# Patient Record
Sex: Female | Born: 1950 | Race: White | Hispanic: No | Smoking: Never smoker
Health system: Southern US, Community
[De-identification: ages and names within clinical notes are randomized; demographics above are authoritative.]

## PROBLEM LIST (undated history)

## (undated) DIAGNOSIS — C679 Malignant neoplasm of bladder, unspecified: Secondary | ICD-10-CM

## (undated) DIAGNOSIS — G8929 Other chronic pain: Secondary | ICD-10-CM

## (undated) DIAGNOSIS — I10 Essential (primary) hypertension: Secondary | ICD-10-CM

## (undated) DIAGNOSIS — C787 Secondary malignant neoplasm of liver and intrahepatic bile duct: Secondary | ICD-10-CM

## (undated) DIAGNOSIS — M6281 Muscle weakness (generalized): Secondary | ICD-10-CM

## (undated) DIAGNOSIS — E876 Hypokalemia: Secondary | ICD-10-CM

## (undated) DIAGNOSIS — N390 Urinary tract infection, site not specified: Secondary | ICD-10-CM

## (undated) DIAGNOSIS — E119 Type 2 diabetes mellitus without complications: Secondary | ICD-10-CM

## (undated) DIAGNOSIS — I4891 Unspecified atrial fibrillation: Secondary | ICD-10-CM

## (undated) DIAGNOSIS — I509 Heart failure, unspecified: Secondary | ICD-10-CM

## (undated) DIAGNOSIS — B850 Pediculosis due to Pediculus humanus capitis: Secondary | ICD-10-CM

## (undated) DIAGNOSIS — R41841 Cognitive communication deficit: Secondary | ICD-10-CM

## (undated) DIAGNOSIS — I639 Cerebral infarction, unspecified: Secondary | ICD-10-CM

## (undated) HISTORY — DX: Unspecified atrial fibrillation: I48.91

## (undated) HISTORY — DX: Malignant neoplasm of bladder, unspecified: C67.9

## (undated) HISTORY — DX: Secondary malignant neoplasm of liver and intrahepatic bile duct: C78.7

## (undated) HISTORY — DX: Muscle weakness (generalized): M62.81

## (undated) HISTORY — DX: Hypokalemia: E87.6

## (undated) HISTORY — DX: Cognitive communication deficit: R41.841

## (undated) HISTORY — DX: Pediculosis due to pediculus humanus capitis: B85.0

## (undated) HISTORY — DX: Heart failure, unspecified: I50.9

## (undated) HISTORY — DX: Other chronic pain: G89.29

## (undated) HISTORY — DX: Urinary tract infection, site not specified: N39.0

---

## 2008-04-11 ENCOUNTER — Ambulatory Visit: Payer: Self-pay

## 2008-05-02 ENCOUNTER — Ambulatory Visit: Payer: Self-pay

## 2008-05-22 ENCOUNTER — Ambulatory Visit: Payer: Self-pay | Admitting: Surgery

## 2008-06-07 ENCOUNTER — Ambulatory Visit: Payer: Self-pay | Admitting: Surgery

## 2009-03-31 ENCOUNTER — Emergency Department: Payer: Self-pay | Admitting: Emergency Medicine

## 2009-04-08 ENCOUNTER — Encounter: Payer: Self-pay | Admitting: Neurology

## 2009-05-04 ENCOUNTER — Encounter: Payer: Self-pay | Admitting: Neurology

## 2009-06-04 ENCOUNTER — Encounter: Payer: Self-pay | Admitting: Neurology

## 2009-07-02 ENCOUNTER — Encounter: Payer: Self-pay | Admitting: Neurology

## 2009-08-02 ENCOUNTER — Encounter: Payer: Self-pay | Admitting: Neurology

## 2009-09-01 ENCOUNTER — Encounter: Payer: Self-pay | Admitting: Neurology

## 2010-02-24 ENCOUNTER — Ambulatory Visit: Payer: Self-pay | Admitting: Family Medicine

## 2011-03-11 ENCOUNTER — Ambulatory Visit: Payer: Self-pay

## 2011-05-05 DIAGNOSIS — I639 Cerebral infarction, unspecified: Secondary | ICD-10-CM

## 2011-05-05 HISTORY — DX: Cerebral infarction, unspecified: I63.9

## 2012-05-18 ENCOUNTER — Ambulatory Visit: Payer: Self-pay | Admitting: Family Medicine

## 2013-07-26 ENCOUNTER — Ambulatory Visit: Payer: Self-pay | Admitting: Family Medicine

## 2014-08-09 ENCOUNTER — Ambulatory Visit
Admit: 2014-08-09 | Disposition: A | Discharge status: Home / Self Care | Payer: Self-pay | Attending: Family Medicine | Admitting: Family Medicine

## 2016-03-20 ENCOUNTER — Emergency Department: Payer: Medicare HMO

## 2016-03-20 ENCOUNTER — Inpatient Hospital Stay
Admission: EM | Admit: 2016-03-20 | Discharge: 2016-03-27 | DRG: 291 | Disposition: A | Discharge status: Skilled Nursing Facility | Payer: Medicare HMO | Attending: Internal Medicine | Admitting: Internal Medicine

## 2016-03-20 ENCOUNTER — Encounter: Payer: Self-pay | Admitting: Emergency Medicine

## 2016-03-20 ENCOUNTER — Inpatient Hospital Stay: Payer: Medicare HMO

## 2016-03-20 DIAGNOSIS — N179 Acute kidney failure, unspecified: Secondary | ICD-10-CM | POA: Diagnosis present

## 2016-03-20 DIAGNOSIS — E662 Morbid (severe) obesity with alveolar hypoventilation: Secondary | ICD-10-CM | POA: Diagnosis present

## 2016-03-20 DIAGNOSIS — Z515 Encounter for palliative care: Secondary | ICD-10-CM | POA: Diagnosis not present

## 2016-03-20 DIAGNOSIS — E782 Mixed hyperlipidemia: Secondary | ICD-10-CM | POA: Diagnosis present

## 2016-03-20 DIAGNOSIS — R0602 Shortness of breath: Secondary | ICD-10-CM

## 2016-03-20 DIAGNOSIS — D509 Iron deficiency anemia, unspecified: Secondary | ICD-10-CM | POA: Diagnosis present

## 2016-03-20 DIAGNOSIS — J9621 Acute and chronic respiratory failure with hypoxia: Secondary | ICD-10-CM | POA: Diagnosis present

## 2016-03-20 DIAGNOSIS — I480 Paroxysmal atrial fibrillation: Secondary | ICD-10-CM | POA: Diagnosis present

## 2016-03-20 DIAGNOSIS — E1122 Type 2 diabetes mellitus with diabetic chronic kidney disease: Secondary | ICD-10-CM | POA: Diagnosis present

## 2016-03-20 DIAGNOSIS — G4733 Obstructive sleep apnea (adult) (pediatric): Secondary | ICD-10-CM | POA: Diagnosis present

## 2016-03-20 DIAGNOSIS — J9601 Acute respiratory failure with hypoxia: Secondary | ICD-10-CM | POA: Diagnosis not present

## 2016-03-20 DIAGNOSIS — R0600 Dyspnea, unspecified: Secondary | ICD-10-CM

## 2016-03-20 DIAGNOSIS — J810 Acute pulmonary edema: Secondary | ICD-10-CM | POA: Diagnosis not present

## 2016-03-20 DIAGNOSIS — Z7984 Long term (current) use of oral hypoglycemic drugs: Secondary | ICD-10-CM | POA: Diagnosis not present

## 2016-03-20 DIAGNOSIS — E876 Hypokalemia: Secondary | ICD-10-CM | POA: Diagnosis present

## 2016-03-20 DIAGNOSIS — J441 Chronic obstructive pulmonary disease with (acute) exacerbation: Secondary | ICD-10-CM | POA: Insufficient documentation

## 2016-03-20 DIAGNOSIS — R609 Edema, unspecified: Secondary | ICD-10-CM

## 2016-03-20 DIAGNOSIS — R0682 Tachypnea, not elsewhere classified: Secondary | ICD-10-CM

## 2016-03-20 DIAGNOSIS — J9602 Acute respiratory failure with hypercapnia: Secondary | ICD-10-CM

## 2016-03-20 DIAGNOSIS — I509 Heart failure, unspecified: Secondary | ICD-10-CM

## 2016-03-20 DIAGNOSIS — I130 Hypertensive heart and chronic kidney disease with heart failure and stage 1 through stage 4 chronic kidney disease, or unspecified chronic kidney disease: Principal | ICD-10-CM | POA: Diagnosis present

## 2016-03-20 DIAGNOSIS — G9341 Metabolic encephalopathy: Secondary | ICD-10-CM | POA: Diagnosis present

## 2016-03-20 DIAGNOSIS — N183 Chronic kidney disease, stage 3 (moderate): Secondary | ICD-10-CM | POA: Diagnosis present

## 2016-03-20 DIAGNOSIS — I481 Persistent atrial fibrillation: Secondary | ICD-10-CM | POA: Diagnosis not present

## 2016-03-20 DIAGNOSIS — J209 Acute bronchitis, unspecified: Secondary | ICD-10-CM | POA: Diagnosis present

## 2016-03-20 DIAGNOSIS — Z66 Do not resuscitate: Secondary | ICD-10-CM | POA: Diagnosis present

## 2016-03-20 DIAGNOSIS — I5031 Acute diastolic (congestive) heart failure: Secondary | ICD-10-CM | POA: Diagnosis present

## 2016-03-20 DIAGNOSIS — J9811 Atelectasis: Secondary | ICD-10-CM | POA: Diagnosis present

## 2016-03-20 DIAGNOSIS — M6281 Muscle weakness (generalized): Secondary | ICD-10-CM

## 2016-03-20 DIAGNOSIS — Z79899 Other long term (current) drug therapy: Secondary | ICD-10-CM

## 2016-03-20 DIAGNOSIS — R32 Unspecified urinary incontinence: Secondary | ICD-10-CM | POA: Diagnosis present

## 2016-03-20 DIAGNOSIS — Z7982 Long term (current) use of aspirin: Secondary | ICD-10-CM | POA: Diagnosis not present

## 2016-03-20 DIAGNOSIS — J9622 Acute and chronic respiratory failure with hypercapnia: Secondary | ICD-10-CM | POA: Diagnosis present

## 2016-03-20 DIAGNOSIS — Z8673 Personal history of transient ischemic attack (TIA), and cerebral infarction without residual deficits: Secondary | ICD-10-CM

## 2016-03-20 DIAGNOSIS — J440 Chronic obstructive pulmonary disease with acute lower respiratory infection: Secondary | ICD-10-CM | POA: Diagnosis present

## 2016-03-20 DIAGNOSIS — Z6841 Body Mass Index (BMI) 40.0 and over, adult: Secondary | ICD-10-CM | POA: Diagnosis not present

## 2016-03-20 DIAGNOSIS — Z452 Encounter for adjustment and management of vascular access device: Secondary | ICD-10-CM

## 2016-03-20 DIAGNOSIS — I4891 Unspecified atrial fibrillation: Secondary | ICD-10-CM | POA: Diagnosis not present

## 2016-03-20 HISTORY — DX: Type 2 diabetes mellitus without complications: E11.9

## 2016-03-20 HISTORY — DX: Essential (primary) hypertension: I10

## 2016-03-20 HISTORY — DX: Cerebral infarction, unspecified: I63.9

## 2016-03-20 LAB — URINALYSIS COMPLETE WITH MICROSCOPIC (ARMC ONLY)
Bilirubin Urine: NEGATIVE
Glucose, UA: NEGATIVE mg/dL
Ketones, ur: NEGATIVE mg/dL
Nitrite: NEGATIVE
Protein, ur: 100 mg/dL — AB
Specific Gravity, Urine: 1.013 (ref 1.005–1.030)
pH: 6 (ref 5.0–8.0)

## 2016-03-20 LAB — IRON AND TIBC
Iron: 19 ug/dL — ABNORMAL LOW (ref 28–170)
Saturation Ratios: 5 % — ABNORMAL LOW (ref 10.4–31.8)
TIBC: 400 ug/dL (ref 250–450)
UIBC: 381 ug/dL

## 2016-03-20 LAB — CBC WITH DIFFERENTIAL/PLATELET
Basophils Absolute: 0.4 10*3/uL — ABNORMAL HIGH (ref 0–0.1)
Basophils Relative: 5 %
Eosinophils Absolute: 0.1 10*3/uL (ref 0–0.7)
Eosinophils Relative: 1 %
HCT: 30 % — ABNORMAL LOW (ref 35.0–47.0)
Hemoglobin: 9.2 g/dL — ABNORMAL LOW (ref 12.0–16.0)
Lymphocytes Relative: 8 %
Lymphs Abs: 0.6 10*3/uL — ABNORMAL LOW (ref 1.0–3.6)
MCH: 22.7 pg — ABNORMAL LOW (ref 26.0–34.0)
MCHC: 30.7 g/dL — ABNORMAL LOW (ref 32.0–36.0)
MCV: 74 fL — ABNORMAL LOW (ref 80.0–100.0)
Monocytes Absolute: 0.6 10*3/uL (ref 0.2–0.9)
Monocytes Relative: 8 %
Neutro Abs: 6.4 10*3/uL (ref 1.4–6.5)
Neutrophils Relative %: 78 %
Platelets: 338 10*3/uL (ref 150–440)
RBC: 4.05 MIL/uL (ref 3.80–5.20)
RDW: 17.4 % — ABNORMAL HIGH (ref 11.5–14.5)
WBC: 8.1 10*3/uL (ref 3.6–11.0)

## 2016-03-20 LAB — GLUCOSE, CAPILLARY: Glucose-Capillary: 229 mg/dL — ABNORMAL HIGH (ref 65–99)

## 2016-03-20 LAB — VITAMIN B12: Vitamin B-12: 771 pg/mL (ref 180–914)

## 2016-03-20 LAB — RETICULOCYTES
RBC.: 3.98 MIL/uL (ref 3.80–5.20)
Retic Count, Absolute: 99.5 10*3/uL (ref 19.0–183.0)
Retic Ct Pct: 2.5 % (ref 0.4–3.1)

## 2016-03-20 LAB — BLOOD GAS, VENOUS
Acid-Base Excess: 10.6 mmol/L — ABNORMAL HIGH (ref 0.0–2.0)
Bicarbonate: 37.6 mmol/L — ABNORMAL HIGH (ref 20.0–28.0)
O2 Saturation: 59.4 %
Patient temperature: 37
pCO2, Ven: 65 mmHg — ABNORMAL HIGH (ref 44.0–60.0)
pH, Ven: 7.37 (ref 7.250–7.430)
pO2, Ven: 32 mmHg (ref 32.0–45.0)

## 2016-03-20 LAB — COMPREHENSIVE METABOLIC PANEL
ALT: 9 U/L — ABNORMAL LOW (ref 14–54)
AST: 18 U/L (ref 15–41)
Albumin: 3.1 g/dL — ABNORMAL LOW (ref 3.5–5.0)
Alkaline Phosphatase: 69 U/L (ref 38–126)
Anion gap: 7 (ref 5–15)
BUN: 24 mg/dL — ABNORMAL HIGH (ref 6–20)
CO2: 34 mmol/L — ABNORMAL HIGH (ref 22–32)
Calcium: 8.7 mg/dL — ABNORMAL LOW (ref 8.9–10.3)
Chloride: 97 mmol/L — ABNORMAL LOW (ref 101–111)
Creatinine, Ser: 1.09 mg/dL — ABNORMAL HIGH (ref 0.44–1.00)
GFR calc Af Amer: 59 mL/min — ABNORMAL LOW (ref >60)
GFR calc non Af Amer: 51 mL/min — ABNORMAL LOW (ref >60)
Glucose, Bld: 164 mg/dL — ABNORMAL HIGH (ref 65–99)
Potassium: 3.4 mmol/L — ABNORMAL LOW (ref 3.5–5.1)
Sodium: 138 mmol/L (ref 135–145)
Total Bilirubin: 1.2 mg/dL (ref 0.3–1.2)
Total Protein: 7.3 g/dL (ref 6.5–8.1)

## 2016-03-20 LAB — FERRITIN: Ferritin: 8 ng/mL — ABNORMAL LOW (ref 11–307)

## 2016-03-20 LAB — PROTIME-INR
INR: 1.26
Prothrombin Time: 15.9 seconds — ABNORMAL HIGH (ref 11.4–15.2)

## 2016-03-20 LAB — BRAIN NATRIURETIC PEPTIDE: B Natriuretic Peptide: 160 pg/mL — ABNORMAL HIGH (ref 0.0–100.0)

## 2016-03-20 LAB — TROPONIN I: Troponin I: <0.03 ng/mL (ref <0.03)

## 2016-03-20 LAB — FOLATE: Folate: 8.8 ng/mL (ref >5.9)

## 2016-03-20 LAB — TSH: TSH: 2.156 u[IU]/mL (ref 0.350–4.500)

## 2016-03-20 LAB — APTT: aPTT: 29 seconds (ref 24–36)

## 2016-03-20 MED ORDER — LINDANE 1 % EX SHAM
MEDICATED_SHAMPOO | Freq: Once | CUTANEOUS | Status: AC
  Administered 2016-03-20: 1 via TOPICAL
  Filled 2016-03-20: qty 60

## 2016-03-20 MED ORDER — HEPARIN (PORCINE) IN NACL 100-0.45 UNIT/ML-% IJ SOLN: 12.0000 [IU]/kg/h | Freq: Once | INTRAMUSCULAR | Status: DC

## 2016-03-20 MED ORDER — IPRATROPIUM-ALBUTEROL 0.5-2.5 (3) MG/3ML IN SOLN
3.0000 mL | RESPIRATORY_TRACT | Status: DC
  Administered 2016-03-20 – 2016-03-23 (×16): 3 mL via RESPIRATORY_TRACT
  Filled 2016-03-20 (×18): qty 3

## 2016-03-20 MED ORDER — INSULIN ASPART 100 UNIT/ML ~~LOC~~ SOLN
0.0000 [IU] | Freq: Three times a day (TID) | SUBCUTANEOUS | Status: DC
  Administered 2016-03-21 (×2): 7 [IU] via SUBCUTANEOUS
  Administered 2016-03-21 – 2016-03-22 (×3): 4 [IU] via SUBCUTANEOUS
  Administered 2016-03-22: 7 [IU] via SUBCUTANEOUS
  Administered 2016-03-23 (×2): 3 [IU] via SUBCUTANEOUS
  Administered 2016-03-23: 4 [IU] via SUBCUTANEOUS
  Administered 2016-03-24 – 2016-03-25 (×3): 3 [IU] via SUBCUTANEOUS
  Administered 2016-03-25 – 2016-03-26 (×3): 4 [IU] via SUBCUTANEOUS
  Administered 2016-03-27: 3 [IU] via SUBCUTANEOUS
  Administered 2016-03-27: 7 [IU] via SUBCUTANEOUS
  Filled 2016-03-20 (×3): qty 4
  Filled 2016-03-20: qty 3
  Filled 2016-03-20 (×3): qty 4
  Filled 2016-03-20: qty 7
  Filled 2016-03-20: qty 3
  Filled 2016-03-20: qty 4
  Filled 2016-03-20: qty 7
  Filled 2016-03-20 (×3): qty 3
  Filled 2016-03-20: qty 4
  Filled 2016-03-20: qty 7
  Filled 2016-03-20: qty 3

## 2016-03-20 MED ORDER — DILTIAZEM HCL 30 MG PO TABS
30.0000 mg | ORAL_TABLET | Freq: Once | ORAL | Status: AC
  Administered 2016-03-20: 30 mg via ORAL
  Filled 2016-03-20: qty 1

## 2016-03-20 MED ORDER — POTASSIUM CHLORIDE 20 MEQ/15ML (10%) PO SOLN
40.0000 meq | Freq: Once | ORAL | Status: AC
  Administered 2016-03-20: 40 meq via ORAL
  Filled 2016-03-20: qty 30

## 2016-03-20 MED ORDER — IPRATROPIUM-ALBUTEROL 0.5-2.5 (3) MG/3ML IN SOLN
3.0000 mL | Freq: Once | RESPIRATORY_TRACT | Status: AC
  Administered 2016-03-20: 3 mL via RESPIRATORY_TRACT
  Filled 2016-03-20: qty 3

## 2016-03-20 MED ORDER — METHYLPREDNISOLONE SODIUM SUCC 40 MG IJ SOLR
40.0000 mg | Freq: Two times a day (BID) | INTRAMUSCULAR | Status: DC
  Administered 2016-03-20 – 2016-03-21 (×3): 40 mg via INTRAVENOUS
  Filled 2016-03-20 (×3): qty 1

## 2016-03-20 MED ORDER — HEPARIN SODIUM (PORCINE) 5000 UNIT/ML IJ SOLN
5000.0000 [IU] | Freq: Three times a day (TID) | INTRAMUSCULAR | Status: DC
  Administered 2016-03-20 – 2016-03-23 (×9): 5000 [IU] via SUBCUTANEOUS
  Filled 2016-03-20 (×9): qty 1

## 2016-03-20 MED ORDER — HEPARIN SODIUM (PORCINE) 5000 UNIT/ML IJ SOLN
4000.0000 [IU] | Freq: Once | INTRAMUSCULAR | Status: AC
  Administered 2016-03-20: 4000 [IU] via INTRAVENOUS
  Filled 2016-03-20: qty 1

## 2016-03-20 MED ORDER — HEPARIN (PORCINE) IN NACL 100-0.45 UNIT/ML-% IJ SOLN
1500.0000 [IU]/h | INTRAMUSCULAR | Status: DC
  Administered 2016-03-20: 1500 [IU]/h via INTRAVENOUS
  Filled 2016-03-20 (×2): qty 250

## 2016-03-20 MED ORDER — ONDANSETRON HCL 4 MG/2ML IJ SOLN: 4.0000 mg | Freq: Four times a day (QID) | INTRAMUSCULAR | Status: DC | PRN

## 2016-03-20 MED ORDER — FUROSEMIDE 10 MG/ML IJ SOLN
40.0000 mg | Freq: Every day | INTRAMUSCULAR | Status: DC
  Administered 2016-03-20 – 2016-03-22 (×3): 40 mg via INTRAVENOUS
  Filled 2016-03-20 (×3): qty 4

## 2016-03-20 MED ORDER — ACETAMINOPHEN 650 MG RE SUPP: 650.0000 mg | Freq: Four times a day (QID) | RECTAL | Status: DC | PRN

## 2016-03-20 MED ORDER — SENNOSIDES-DOCUSATE SODIUM 8.6-50 MG PO TABS
1.0000 | ORAL_TABLET | Freq: Every evening | ORAL | Status: DC | PRN
  Administered 2016-03-25: 1 via ORAL
  Filled 2016-03-20: qty 1

## 2016-03-20 MED ORDER — ASPIRIN EC 325 MG PO TBEC
325.0000 mg | DELAYED_RELEASE_TABLET | Freq: Every day | ORAL | Status: DC
  Administered 2016-03-20 – 2016-03-27 (×7): 325 mg via ORAL
  Filled 2016-03-20 (×7): qty 1

## 2016-03-20 MED ORDER — DILTIAZEM HCL 100 MG IV SOLR
5.0000 mg/h | INTRAVENOUS | Status: DC
  Administered 2016-03-20: 15 mg/h via INTRAVENOUS
  Administered 2016-03-20: 5 mg/h via INTRAVENOUS
  Administered 2016-03-20: 10 mg/h via INTRAVENOUS
  Administered 2016-03-21: 15 mg/h via INTRAVENOUS
  Administered 2016-03-21: 9 mg/h via INTRAVENOUS
  Administered 2016-03-21 (×2): 15 mg/h via INTRAVENOUS
  Filled 2016-03-20 (×5): qty 100

## 2016-03-20 MED ORDER — OXYCODONE HCL 5 MG PO TABS
5.0000 mg | ORAL_TABLET | ORAL | Status: DC | PRN
  Administered 2016-03-25: 5 mg via ORAL
  Filled 2016-03-20: qty 1

## 2016-03-20 MED ORDER — LEVOFLOXACIN IN D5W 500 MG/100ML IV SOLN
500.0000 mg | INTRAVENOUS | Status: DC
  Administered 2016-03-20: 500 mg via INTRAVENOUS
  Filled 2016-03-20 (×2): qty 100

## 2016-03-20 MED ORDER — PERMETHRIN 1 % EX LOTN
TOPICAL_LOTION | Freq: Once | CUTANEOUS | Status: DC
  Filled 2016-03-20: qty 59

## 2016-03-20 MED ORDER — ONDANSETRON HCL 4 MG PO TABS: 4.0000 mg | ORAL_TABLET | Freq: Four times a day (QID) | ORAL | Status: DC | PRN

## 2016-03-20 MED ORDER — SODIUM CHLORIDE 0.9 % IV SOLN
1000.0000 mL | Freq: Once | INTRAVENOUS | Status: AC
  Administered 2016-03-20: 1000 mL via INTRAVENOUS

## 2016-03-20 MED ORDER — DILTIAZEM LOAD VIA INFUSION
10.0000 mg | Freq: Once | INTRAVENOUS | Status: AC
  Administered 2016-03-20: 10 mg via INTRAVENOUS
  Filled 2016-03-20: qty 10

## 2016-03-20 MED ORDER — METHYLPREDNISOLONE SODIUM SUCC 125 MG IJ SOLR
125.0000 mg | Freq: Once | INTRAMUSCULAR | Status: AC
  Administered 2016-03-20: 125 mg via INTRAVENOUS
  Filled 2016-03-20: qty 2

## 2016-03-20 MED ORDER — INSULIN ASPART 100 UNIT/ML ~~LOC~~ SOLN
0.0000 [IU] | Freq: Every day | SUBCUTANEOUS | Status: DC
  Administered 2016-03-20: 2 [IU] via SUBCUTANEOUS
  Filled 2016-03-20: qty 2
  Filled 2016-03-20 (×2): qty 4

## 2016-03-20 MED ORDER — DILTIAZEM HCL 25 MG/5ML IV SOLN
10.0000 mg | Freq: Once | INTRAVENOUS | Status: AC
  Administered 2016-03-20: 10 mg via INTRAVENOUS
  Filled 2016-03-20: qty 5

## 2016-03-20 MED ORDER — ACETAMINOPHEN 325 MG PO TABS: 650.0000 mg | ORAL_TABLET | Freq: Four times a day (QID) | ORAL | Status: DC | PRN

## 2016-03-20 MED ORDER — BISACODYL 5 MG PO TBEC: 5.0000 mg | DELAYED_RELEASE_TABLET | Freq: Every day | ORAL | Status: DC | PRN

## 2016-03-20 NOTE — ED Notes (Addendum)
Patient transported to Ultrasound 

## 2016-03-20 NOTE — ED Provider Notes (Signed)
[facility] Emergency Department Provider Note        Time seen: ----------------------------------------- 12:04 PM on 03/20/2016 -----------------------------------------    I have reviewed the triage vital signs and the nursing notes.   HISTORY  Chief Complaint Altered Mental Status and Urinary Incontinence    HPI Brittney Shepard is a 65 y.o. female who states she awoke with shortness of breath today.Patient brought from home via EMS with altered mental status and urinary incontinence as well as well. She is found to be tachycardic and dyspneic with hypoxia. She had difficulty answering questions by EMS and some urinary incontinence which is new. Patient diagnosed with a stroke in 2013. She arrives alert and oriented. She presents with atrial fibrillation on the monitor.   Past Medical History:  Diagnosis Date  . COPD (chronic obstructive pulmonary disease) ([place])   . Diabetes mellitus without complication ([place])   . Hypertension   . Stroke [facility]) 2013    There are no active problems to display for this patient.   History reviewed. No pertinent surgical history.  Allergies Patient has no allergy information on record.  Social History Social History  Substance Use Topics  . Smoking status: Never Smoker  . Smokeless tobacco: Never Used  . Alcohol use No    Review of Systems Constitutional: Negative for fever. Cardiovascular: Negative for chest pain. Respiratory: Positive for shortness of breath and cough Gastrointestinal: Negative for abdominal pain, vomiting and diarrhea. Genitourinary: Negative for dysuria.Positive for urinary incontinence Musculoskeletal: Negative for back pain. Skin: Negative for rash. Neurological: Negative for headaches, focal weakness or numbness.  10-point ROS otherwise negative.  ____________________________________________   PHYSICAL EXAM:  VITAL SIGNS: ED Triage Vitals  Enc Vitals Group     BP 03/20/16 1200  112/82     Pulse Rate 03/20/16 1200 (!) 122     Resp 03/20/16 1200 (!) 29     Temp 03/20/16 1200 97.5 F (36.4 C)     Temp Source 03/20/16 1200 Oral     SpO2 03/20/16 1200 97 %     Weight 03/20/16 1201 (!) 360 lb (163.3 kg)     Height 03/20/16 1201 5\' 6"  (1.676 m)     Head Circumference --      Peak Flow --      Pain Score --      Pain Loc --      Pain Edu? --      Excl. in [place]? --     Constitutional: Alert and oriented. Chronically ill-appearing, mild distress Eyes: Conjunctivae are normal. PERRL. Normal extraocular movements. ENT   Head: Normocephalic and atraumatic.   Nose: No congestion/rhinnorhea.   Mouth/Throat: Mucous membranes are moist.   Neck: No stridor. Cardiovascular: Rapid rate, regular rhythm. No murmurs, rubs, or gallops. Respiratory: Tachypnea with wheezing Gastrointestinal: Soft and nontender. Normal bowel sounds Musculoskeletal: Nontender with normal range of motion in all extremities. Marked lower extremity edema bilaterally Neurologic:  Normal speech and language. No gross focal neurologic deficits are appreciated.  Skin:  Skin is warm, dry and intact. No rash noted. Psychiatric: Mood and affect are normal. Speech and behavior are normal.  ____________________________________________  EKG: Interpreted by me. Atrial fibrillation with a rapid ventricular response, rate is 163 bpm, normal QRS width, normal QT, normal axis. Likely septal infarct age indeterminate.  ____________________________________________  ED COURSE:  Pertinent labs & imaging results that were available during my care of the patient were reviewed by me and considered in my medical decision making (  see chart for details). Clinical Course   Patient presents to the ER with multiple complaints, dyspnea, A. fib, urinary incontinence, altered mental status. We will assess with labs and imaging.  Procedures ____________________________________________   LABS (pertinent  positives/negatives)  Labs Reviewed  CBC WITH DIFFERENTIAL/PLATELET - Abnormal; Notable for the following:       Result Value   Hemoglobin 9.2 (*)    HCT 30.0 (*)    MCV 74.0 (*)    MCH 22.7 (*)    MCHC 30.7 (*)    RDW 17.4 (*)    Lymphs Abs 0.6 (*)    Basophils Absolute 0.4 (*)    All other components within normal limits  COMPREHENSIVE METABOLIC PANEL - Abnormal; Notable for the following:    Potassium 3.4 (*)    Chloride 97 (*)    CO2 34 (*)    Glucose, Bld 164 (*)    BUN 24 (*)    Creatinine, Ser 1.09 (*)    Calcium 8.7 (*)    Albumin 3.1 (*)    ALT 9 (*)    GFR calc non Af Amer 51 (*)    GFR calc Af Amer 59 (*)    All other components within normal limits  BLOOD GAS, VENOUS - Abnormal; Notable for the following:    pCO2, Ven 65 (*)    Bicarbonate 37.6 (*)    Acid-Base Excess 10.6 (*)    All other components within normal limits  PROTIME-INR - Abnormal; Notable for the following:    Prothrombin Time 15.9 (*)    All other components within normal limits  TROPONIN I  APTT  URINALYSIS COMPLETEWITH MICROSCOPIC ([facility] ONLY)  HEPARIN LEVEL (UNFRACTIONATED)   CRITICAL CARE Performed by: Earleen Newport   Total critical care time: 30 minutes  Critical care time was exclusive of separately billable procedures and treating other patients.  Critical care was necessary to treat or prevent imminent or life-threatening deterioration.  Critical care was time spent personally by me on the following activities: development of treatment plan with patient and/or surrogate as well as nursing, discussions with consultants, evaluation of patient's response to treatment, examination of patient, obtaining history from patient or surrogate, ordering and performing treatments and interventions, ordering and review of laboratory studies, ordering and review of radiographic studies, pulse oximetry and re-evaluation of patient's condition.  RADIOLOGY  Chest  x-ray  IMPRESSION: Mild cardiomegaly. Mild bibasilar subsegmental atelectasis. ____________________________________________  FINAL ASSESSMENT AND PLAN  Dyspnea, rapid atrial fibrillation  Plan: Patient with labs and imaging as dictated above. Patient presents to ER in mild distress from COPD and found to be in rapid A. fib. This improved with minimal dosing of Cardizem. It is unclear how long this has been a problem for her. We will continue her on heparin. I will discuss with the hospitalist for admission.   Earleen Newport, MD   Note: This dictation was prepared with Dragon dictation. Any transcriptional errors that result from this process are unintentional    Earleen Newport, MD 03/20/16 [phone]

## 2016-03-20 NOTE — Progress Notes (Signed)
Inpatient Diabetes Program Recommendations  AACE/ADA: New Consensus Statement on Inpatient Glycemic Control (2015)  Target Ranges:  Prepandial:   less than 140 mg/dL      Peak postprandial:   less than 180 mg/dL (1-2 hours)      Critically ill patients:  140 - 180 mg/dL   Review of Glycemic Control  Diabetes history: DM 2 Outpatient Diabetes medications: None Current orders for Inpatient glycemic control: Novolog Resistant (0-20 units) + HS scale (0-5 units)  Inpatient Diabetes Program Recommendations:   Patient received IV Solumedrol 125 mg at 1228 pm today at that time glucose 164 mg/dl. Patient will receive IV Solumedrol 40 mg Q 12hours. Novolog Correction + HS scale ordered for admit. Would recommend starting Novolog Moderate Correction scale (0-15 units) instead due to lack of DM medications taken at home. Will watch trends while inpatient and on steroids.  Thanks, Tama Headings RN, MSN, [facility] Inpatient Diabetes Coordinator Team Pager [phone] (8a-5p)

## 2016-03-20 NOTE — Progress Notes (Signed)
Patient also presents with lice on her head,MD notified and medication ordered.

## 2016-03-20 NOTE — Progress Notes (Signed)
Family Meeting Note  Advance Directive:no  Today a meeting took place with the Patient.and spouse    The following clinical team members were present during this meeting:MD  The following were discussed:Patient's diagnosis: Acute hypoxic and hypercarbic respiratory failure with diabetes and morbid obesity , Patient's progosis: Unable to determine and Goals for treatment: DNR  Additional follow-up to be provided: needs to file for advanced directives They will do that after discharge and speak with her physician.   Time spent during discussion:16 minutes  Marissa Lowrey, MD

## 2016-03-20 NOTE — Progress Notes (Signed)
ANTICOAGULATION CONSULT NOTE - Initial Consult  Pharmacy Consult for Heparin drip Indication: atrial fibrillation  No Known Allergies  Patient Measurements: Height: 5\' 6"  (167.6 cm) Weight: (!) 360 lb (163.3 kg) IBW/kg (Calculated) : 59.3 Heparin Dosing Weight: 100.9 kg *  Vital Signs: Temp: 97.5 F (36.4 C) (11/17 1200) Temp Source: Oral (11/17 1200) BP: 112/82 (11/17 1200) Pulse Rate: 122 (11/17 1200)  Labs:  Recent Labs  03/20/16 1221  HGB 9.2*  HCT 30.0*  PLT 338  APTT 29  LABPROT 15.9*  INR 1.26  CREATININE 1.09*  TROPONINI <0.03    Estimated Creatinine Clearance: 79.8 mL/min (by C-G formula based on SCr of 1.09 mg/dL (H)).   Medical History: Past Medical History:  Diagnosis Date  . COPD (chronic obstructive pulmonary disease) ([place])   . Diabetes mellitus without complication ([place])   . Hypertension   . Stroke [facility]) 2013    Medications:  Scheduled:  . diltiazem  30 mg Oral Once  . heparin  4,000 Units Intravenous Once   Infusions:  . heparin      Assessment: 65 yo F with AMS/urinary incontinence with Afib/tachycardia. Patient on Aspirin at home.  Hx stroke   Baseline labs:  Hgb 9.2  Plt 338  INR 1.26  aPTT 29   Goal of Therapy:  Heparin level 0.3-0.7 units/ml Monitor platelets by anticoagulation protocol: Yes   Plan:  Give 4000 units bolus x 1  Will start Heparin drip at 1500 units/hr. Will check Heparin level (Anti-Xa) in 6 hrs. Will check CBC daily while on drip.   Zaylie Gisler A 03/20/2016,2:14 PM

## 2016-03-20 NOTE — H&P (Addendum)
[facility] [place] NAME: Brittney Shepard    MR#:  [id number]  DATE OF BIRTH:  April 15, 1951  DATE OF ADMISSION:  03/20/2016  PRIMARY CARE PHYSICIAN: [facility]   REQUESTING/REFERRING PHYSICIAN: Dr Jimmye Norman  CHIEF COMPLAINT:    SOB and wheezing HISTORY OF PRESENT ILLNESS:  Brittney Shepard  is a 65 y.o. female with a known history of Morbid obesity, diabetes and essential hypertension who presents with above complaint. Patient reports over the past several a she has had increasing shortness of breath and wheezing. On her medical chart initially assessed COPD but patient and her husband are adamant that she has never been diagnosed with COPD and she has never smoked. This is now been removed from her medical chart. She was seen on Friday at [place] due to shortness of breath and she reports that they sent her home without any medical therapy. She comes into the ER via EMS due to increasing shortness of breath and wheezing. She is also found to have new onset atrial fibrillation. She has received IV steroids and DuoNeb nebs and reports that she does feel better. She is also found to be hypoxic on room air with O2 saturations in the mid 80s. She denies history of sleep apnea, however husband reports that she needs to be tested for sleep apnea. Her heart rates are in the 120s and she has been given IV diltiazem.  PAST MEDICAL HISTORY:   Past Medical History:  Diagnosis Date  .    Marland Kitchen Diabetes mellitus without complication ([place])   . Hypertension   . Stroke [facility]) 2013    PAST SURGICAL HISTORY:   None SOCIAL HISTORY:   Social History  Substance Use Topics  . Smoking status: Never Smoker  . Smokeless tobacco: Never Used  . Alcohol use No    FAMILY HISTORY:  Positive history for hypertension and CAD  DRUG ALLERGIES:  No Known Allergies  REVIEW OF SYSTEMS:   Review of Systems  Constitutional: Positive for malaise/fatigue. Negative for chills  and fever.  HENT: Negative.  Negative for ear discharge, ear pain, hearing loss, nosebleeds and sore throat.   Eyes: Negative.  Negative for blurred vision and pain.  Respiratory: Positive for cough, shortness of breath and wheezing. Negative for hemoptysis.   Cardiovascular: Positive for leg swelling. Negative for chest pain and palpitations.  Gastrointestinal: Negative.  Negative for abdominal pain, blood in stool, diarrhea, nausea and vomiting.  Genitourinary: Negative.  Negative for dysuria.  Musculoskeletal: Negative.  Negative for back pain.  Skin: Negative.   Neurological: Positive for weakness. Negative for dizziness, tremors, speech change, focal weakness, seizures and headaches.  Endo/Heme/Allergies: Negative.  Does not bruise/bleed easily.  Psychiatric/Behavioral: Negative.  Negative for depression, hallucinations and suicidal ideas.    MEDICATIONS AT HOME:   Prior to Admission medications   Medication Sig Start Date End Date Taking? Authorizing Provider  aspirin EC 81 MG tablet Take 81 mg by mouth daily.   Yes Historical Provider, MD      VITAL SIGNS:  Blood pressure 117/77, pulse 69, temperature 97.5 F (36.4 C), temperature source Oral, resp. rate (!) 26, height 5\' 6"  (1.676 m), weight (!) 163.3 kg (360 lb), SpO2 94 %.  PHYSICAL EXAMINATION:   Physical Exam  Constitutional: She is oriented to person, place, and time and well-developed, well-nourished, and in no distress. No distress.  HENT:  Head: Normocephalic.  Eyes: No scleral icterus.  Neck: Normal range of motion.  Neck supple. No JVD present. No tracheal deviation present.  Cardiovascular: Normal rate, regular rhythm and normal heart sounds.  Exam reveals no gallop and no friction rub.   No murmur heard. Tachycardia with irregular irregular heartbeat  Pulmonary/Chest: Effort normal. No respiratory distress. She has wheezes. She has no rales. She exhibits no tenderness.  Abdominal: Soft. Bowel sounds are  normal. She exhibits no distension. There is no tenderness. There is no rebound and no guarding.  Obese  Musculoskeletal: Normal range of motion. She exhibits edema.  Neurological: She is alert and oriented to person, place, and time.  Skin: Skin is warm. No rash noted. No erythema.  Psychiatric: Affect and judgment normal.      LABORATORY PANEL:   CBC  Recent Labs Lab 03/20/16 1221  WBC 8.1  HGB 9.2*  HCT 30.0*  PLT 338   ------------------------------------------------------------------------------------------------------------------  Chemistries   Recent Labs Lab 03/20/16 1221  NA 138  K 3.4*  CL 97*  CO2 34*  GLUCOSE 164*  BUN 24*  CREATININE 1.09*  CALCIUM 8.7*  AST 18  ALT 9*  ALKPHOS 69  BILITOT 1.2   ------------------------------------------------------------------------------------------------------------------  Cardiac Enzymes  Recent Labs Lab 03/20/16 1221  TROPONINI <0.03   ------------------------------------------------------------------------------------------------------------------  RADIOLOGY:  Dg Chest 1 View  Result Date: 03/20/2016 CLINICAL DATA:  Shortness of breath. EXAM: CHEST 1 VIEW COMPARISON:  None. FINDINGS: Mild cardiomegaly is noted. No pneumothorax or pleural effusion is noted. Bony thorax is unremarkable. Mild bibasilar subsegmental atelectasis is noted. IMPRESSION: Mild cardiomegaly.  Mild bibasilar subsegmental atelectasis. Electronically Signed   By: Marijo Conception, M.D.   On: 03/20/2016 14:09    EKG:   Atrial fibrillation heart rate 163 no ST elevation or depression  IMPRESSION AND PLAN:   65 year old female with morbid obesity, diabetes and essential hypertension who presents with shortness of breath and wheezing found to have new onset hypercarbic respiratory failure and atrial fibrillation.  1. Acute hypoxic/hypercarbic respiratory failure in the setting of wheezing: Patient most likely with acute bronchitis  and bronchospasm with reactive airway disease. Start IV Levaquin and steroids. Nebulizer treatment. Patient would most benefit from pulmonary evaluation and outpatient sleep evaluation at discharge. Recheck ABG in a.m. for elevation in CO2. It is quite possible patient may have obesity hypoventilation syndrome. If she continues to have hypoxia with elevation in CO2.   2. New onset atrial fibrillation in the setting of problem #1: Start IV diltiazem drip with goal heart rate less than 100 Start aspirin and consider anticoagulation if echocardiogram does not show major valvular dysfunction as etiology for atrial fibrillation. CHADS VASC score >3. [facility] cardiology consultation. Check TSH.  3. Anemia, unknown type: Check iron panel  4. Essential hypertension: Patient is on diltiazem drip due to problem #2.  5. Diabetes: Patient will be on sliding scale insulin and outpatient medications Diabetes coordinator consult  6. Hypokalemia, mild: Replete and recheck in a.m.  7. Profound lower extremity edema: Start IV Lasix and check echocardiogram and check lower extremity Doppler for DVT.   8. Morbid obesity: ENCOURAGE WEIGHT LOSS AND DIET CONTROL. COnsult dietitian.   All the records are reviewed and case discussed with ED provider. Management plans discussed with the patient and she is  in agreement  CODE STATUS: DNR  TOTAL TIME TAKING CARE OF THIS PATIENT: 45 minutes.    Samrat Hayward M.D on 03/20/2016 at 3:01 PM  Between 7am to 6pm - Pager - [phone]  After 6pm go to [email or web address] - password [email or web address]  [facility]  Office  [phone]  CC: Primary care physician; [facility]

## 2016-03-20 NOTE — Progress Notes (Signed)
Nurse just notified me that patient has lice. Orders written for shampoo and patient will need to be in isolation.

## 2016-03-20 NOTE — ED Triage Notes (Signed)
Pt to ED from home via EMS c/o urinary incontinence and AMS.  EMS states patient has had difficulty answering questions and speech being off for several days and states urinary incontinence is new.  Pt with previous stroke in 2013, hx DBM.  Pt presents alert, oriented to place and person, disoriented to time.  Pt presents in a.fib and tachycardic on monitor.

## 2016-03-20 NOTE — Progress Notes (Signed)
Admitted for afib RVR,cardizem drip in progress,denies pain.

## 2016-03-20 NOTE — ED Notes (Signed)
Pt attempted to urinate on bedpan, unsuccessful.

## 2016-03-20 NOTE — Progress Notes (Signed)
Pharmacy Antibiotic Note  Brittney Shepard is a 65 y.o. female admitted on 03/20/2016 with pneumonia/CAP?/acute bronchitits.  Pharmacy has been consulted for Levaquin dosing.  Plan: Will begin Levaquin 500mg  IV q24h.    Height: 5\' 7"  (170.2 cm) Weight: (!) 353 lb 3.2 oz (160.2 kg) IBW/kg (Calculated) : 61.6  Temp (24hrs), Avg:97.6 F (36.4 C), Min:97.5 F (36.4 C), Max:97.7 F (36.5 C)   Recent Labs Lab 03/20/16 1221  WBC 8.1  CREATININE 1.09*    Estimated Creatinine Clearance: 79.9 mL/min (by C-G formula based on SCr of 1.09 mg/dL (H)).    No Known Allergies  Antimicrobials this admission: Levaquin  11/17 >>       >>    Dose adjustments this admission:    Microbiology results:   BCx:     UCx:      Sputum:      MRSA PCR:    Thank you for allowing pharmacy to be a part of this patient's care.  Zakiyyah Savannah A 03/20/2016 6:03 PM

## 2016-03-21 ENCOUNTER — Inpatient Hospital Stay: Payer: Medicare HMO

## 2016-03-21 LAB — CBC
HCT: 29.3 % — ABNORMAL LOW (ref 35.0–47.0)
Hemoglobin: 8.8 g/dL — ABNORMAL LOW (ref 12.0–16.0)
MCH: 22.3 pg — ABNORMAL LOW (ref 26.0–34.0)
MCHC: 30.1 g/dL — ABNORMAL LOW (ref 32.0–36.0)
MCV: 74.1 fL — ABNORMAL LOW (ref 80.0–100.0)
Platelets: 384 10*3/uL (ref 150–440)
RBC: 3.96 MIL/uL (ref 3.80–5.20)
RDW: 17.6 % — ABNORMAL HIGH (ref 11.5–14.5)
WBC: 6.1 10*3/uL (ref 3.6–11.0)

## 2016-03-21 LAB — BASIC METABOLIC PANEL
Anion gap: 9 (ref 5–15)
BUN: 28 mg/dL — ABNORMAL HIGH (ref 6–20)
CO2: 31 mmol/L (ref 22–32)
Calcium: 8.7 mg/dL — ABNORMAL LOW (ref 8.9–10.3)
Chloride: 98 mmol/L — ABNORMAL LOW (ref 101–111)
Creatinine, Ser: 1.21 mg/dL — ABNORMAL HIGH (ref 0.44–1.00)
GFR calc Af Amer: 52 mL/min — ABNORMAL LOW (ref >60)
GFR calc non Af Amer: 45 mL/min — ABNORMAL LOW (ref >60)
Glucose, Bld: 201 mg/dL — ABNORMAL HIGH (ref 65–99)
Potassium: 4.1 mmol/L (ref 3.5–5.1)
Sodium: 138 mmol/L (ref 135–145)

## 2016-03-21 LAB — BLOOD GAS, ARTERIAL
Acid-Base Excess: 8.8 mmol/L — ABNORMAL HIGH (ref 0.0–2.0)
Bicarbonate: 34.7 mmol/L — ABNORMAL HIGH (ref 20.0–28.0)
FIO2: 0.28
O2 Saturation: 93 %
Patient temperature: 37
pCO2 arterial: 56 mmHg — ABNORMAL HIGH (ref 32.0–48.0)
pH, Arterial: 7.4 (ref 7.350–7.450)
pO2, Arterial: 67 mmHg — ABNORMAL LOW (ref 83.0–108.0)

## 2016-03-21 LAB — GLUCOSE, CAPILLARY
Glucose-Capillary: 199 mg/dL — ABNORMAL HIGH (ref 65–99)
Glucose-Capillary: 235 mg/dL — ABNORMAL HIGH (ref 65–99)
Glucose-Capillary: 233 mg/dL — ABNORMAL HIGH (ref 65–99)
Glucose-Capillary: 174 mg/dL — ABNORMAL HIGH (ref 65–99)

## 2016-03-21 LAB — IRON AND TIBC
Iron: 14 ug/dL — ABNORMAL LOW (ref 28–170)
Saturation Ratios: 3 % — ABNORMAL LOW (ref 10.4–31.8)
TIBC: 407 ug/dL (ref 250–450)
UIBC: 393 ug/dL

## 2016-03-21 LAB — VITAMIN B12: Vitamin B-12: 670 pg/mL (ref 180–914)

## 2016-03-21 LAB — FERRITIN: Ferritin: 8 ng/mL — ABNORMAL LOW (ref 11–307)

## 2016-03-21 LAB — FOLATE: Folate: 10 ng/mL (ref >5.9)

## 2016-03-21 MED ORDER — FUROSEMIDE 10 MG/ML IJ SOLN
20.0000 mg | Freq: Once | INTRAMUSCULAR | Status: AC
  Administered 2016-03-21: 20 mg via INTRAVENOUS
  Filled 2016-03-21: qty 2

## 2016-03-21 MED ORDER — IPRATROPIUM-ALBUTEROL 0.5-2.5 (3) MG/3ML IN SOLN
3.0000 mL | Freq: Once | RESPIRATORY_TRACT | Status: AC
  Administered 2016-03-21: 3 mL via RESPIRATORY_TRACT

## 2016-03-21 MED ORDER — IPRATROPIUM-ALBUTEROL 0.5-2.5 (3) MG/3ML IN SOLN: 3.0000 mL | Freq: Four times a day (QID) | RESPIRATORY_TRACT | Status: DC

## 2016-03-21 MED ORDER — LEVOFLOXACIN 500 MG PO TABS
500.0000 mg | ORAL_TABLET | Freq: Every day | ORAL | Status: DC
  Administered 2016-03-21: 500 mg via ORAL
  Filled 2016-03-21: qty 1

## 2016-03-21 NOTE — Progress Notes (Signed)
PHARMACIST - PHYSICIAN COMMUNICATION DR:   Benjie Karvonen CONCERNING: Antibiotic IV to Oral Route Change Policy  RECOMMENDATION: This patient is receiving Levofloxacin  by the intravenous route.  Based on criteria approved by the Pharmacy and Therapeutics Committee, the antibiotic(s) is/are being converted to the equivalent oral dose form(s).   DESCRIPTION: These criteria include:  Patient being treated for a respiratory tract infection, urinary tract infection, cellulitis or clostridium difficile associated diarrhea if on metronidazole  The patient is not neutropenic and does not exhibit a GI malabsorption state  The patient is eating (either orally or via tube) and/or has been taking other orally administered medications for a least 24 hours  The patient is improving clinically and has a Tmax < 100.5  If you have questions about this conversion, please contact the Pharmacy Department  []   [phone] )  [name] [x]   [phone] )  [facility] []   [phone] )  [facility] []   [phone] )  [facility] []   [phone] )  [name]    Larene Beach, PharmD

## 2016-03-21 NOTE — Evaluation (Addendum)
Physical Therapy Evaluation Patient Details Name: Brittney Shepard MRN: [id number] DOB: 02-16-1951 Today's Date: 03/21/2016   History of Present Illness  Brittney Shepard  is a 65 y.o. female with a known history of Morbid obesity, diabetes and essential hypertension who presents with above complaint. Patient reports over the past several a she has had increasing shortness of breath and wheezing. On her medical chart initially assessed COPD but patient and her husband are adamant that she has never been diagnosed with COPD and she has never smoked. This is now been removed from her medical chart. She was seen on Friday at [facility] due to shortness of breath and she reports that they sent her home without any medical therapy. She comes into the ER via EMS due to increasing shortness of breath and wheezing. She is also found to have new onset atrial fibrillation. She has received IV steroids and DuoNeb nebs and reports that she does feel better. She is also found to be hypoxic on room air with O2 saturations in the mid 80s. She denies history of sleep apnea, however husband reports that she needs to be tested for sleep apnea. Her heart rates are in the 120s and she has been given IV diltiazem.  Clinical Impression  Pt admitted with above diagnosis. Pt currently with functional limitations due to the deficits listed below (see PT Problem List).  Pt is very weak and her morbid obesity makes bed mobility very challenging. She requires maxA+1 with extended time to get her up to EOB. Pt unwilling to remain upright for only 3-4 seconds due to fatigue and weakness. She falls back onto her R side and then requests getting back to bed. Pt visibly fatigued with only bed mobility. HR elevates however SaO2 remains stable on supplementary O2. Pt would be unable to return home in current condition and needs SNF placement at discharge. Pt will benefit from skilled PT services to address deficits in strength, balance, and mobility in  order to return to full function at home.     Follow Up Recommendations SNF    Equipment Recommendations  None recommended by PT    Recommendations for Other Services       Precautions / Restrictions Precautions Precautions: Fall Restrictions Weight Bearing Restrictions: No      Mobility  Bed Mobility Overal bed mobility: Needs Assistance Bed Mobility: Supine to Sit;Sit to Supine     Supine to sit: Max assist Sit to supine: Max assist   General bed mobility comments: Pt is very weak and has difficulty moving the bed. Extended time and heavy assist, maxA+1, required for patient to roll to the R and come upright to sitting at EOB. Pt reports extreme fatigue and states she is unable to remain upright, laying quickly back down on bed. HR increases to 120-130 bpm during bed mobility. SaO2 remains >90% on supplemental O2. Pt states she is too tired and unable to transfer or ambulate at this time  Transfers                 General transfer comment: Unable/unsafe to attempt. Pt unwilling at this time due to fatigue  Ambulation/Gait             General Gait Details: Unable/unsafe to attempt.   Stairs            Wheelchair Mobility    Modified Rankin (Stroke Patients Only)       Balance Overall balance assessment: Needs assistance Sitting-balance support: Feet supported  Sitting balance-Leahy Scale: Fair Sitting balance - Comments: Pt only willing to stay upright for short bout due to fatigue       Standing balance comment: Unable to assess                             Pertinent Vitals/Pain Pain Assessment: No/denies pain    Home Living Family/patient expects to be discharged to:: Private residence Living Arrangements: Spouse/significant other Available Help at Discharge: Family Type of Home: House Home Access: Ramped entrance     Home Layout: One level Home Equipment: Environmental consultant - 2 wheels;Cane - single point;Bedside  commode;Wheelchair - manual (no hospital bed)      Prior Function Level of Independence: Independent with assistive device(s)         Comments: Household ambulator with spc prn, no falls. Independent with ADLs, assist from husband with IADLs     Hand Dominance   Dominant Hand: Right    Extremity/Trunk Assessment   Upper Extremity Assessment: Generalized weakness           Lower Extremity Assessment: Generalized weakness         Communication   Communication: No difficulties  Cognition Arousal/Alertness: Lethargic Behavior During Therapy: WFL for tasks assessed/performed Overall Cognitive Status: No family/caregiver present to determine baseline cognitive functioning (AOx2, disoriented to year/month, unable to provide DOB)                      General Comments      Exercises     Assessment/Plan    PT Assessment Patient needs continued PT services  PT Problem List Decreased strength;Decreased activity tolerance;Decreased balance;Decreased mobility;Obesity          PT Treatment Interventions DME instruction;Gait training;Functional mobility training;Therapeutic activities;Therapeutic exercise;Balance training;Neuromuscular re-education    PT Goals (Current goals can be found in the Care Plan section)  Acute Rehab PT Goals Patient Stated Goal: To get stronger PT Goal Formulation: With patient Time For Goal Achievement: 04/04/16 Potential to Achieve Goals: Poor    Frequency Min 2X/week   Barriers to discharge Decreased caregiver support Husband lives at home but currently pt would be +2 to transfer or attempt ambulation    Co-evaluation               End of Session Equipment Utilized During Treatment: Oxygen Activity Tolerance: Patient limited by fatigue Patient left: in bed;with call bell/phone within reach;with bed alarm set           Time: [id number] PT Time Calculation (min) (ACUTE ONLY): 15 min   Charges:   PT  Evaluation $PT Eval Moderate Complexity: 1 Procedure     PT G Codes:       Lyndel Safe Kaijah Abts PT, DPT   Dorea Duff 03/21/2016, 9:56 AM

## 2016-03-21 NOTE — Progress Notes (Signed)
Patient ID: Brittney Shepard, female   DOB: May 31, 1950, 65 y.o.   MRN: [id number]   [facility] [place] NAME: Brittney Shepard    MR#:  [id number]  DATE OF BIRTH:  [birth year]  SUBJECTIVE:  CHIEF COMPLAINT:   Chief Complaint  Patient presents with  . Altered Mental Status  . Urinary Incontinence   Patient seen at bedside. Chart reviewed and care assumed. She reports that her shortness of breath and palpitations are improved. She denies any chest pain, palpitations, shortness of breath, nausea, vomiting. She denies ever having had a cardiac workup and has no known history of atrial fibrillation. REVIEW OF SYSTEMS:  ROS  Constitutional: Positive for malaise/fatigue. Negative for chills and fever.  HENT: Negative.  Negative for ear discharge, ear pain, hearing loss, nosebleeds and sore throat.   Eyes: Negative.  Negative for blurred vision and pain.  Respiratory: Positive for cough, shortness of breath and wheezing. Negative for hemoptysis.   Cardiovascular: Positive for leg swelling. Negative for chest pain and palpitations.  Gastrointestinal: Negative.  Negative for abdominal pain, blood in stool, diarrhea, nausea and vomiting.  Genitourinary: Negative.  Negative for dysuria.  Musculoskeletal: Negative.  Negative for back pain.  Skin: Negative.   Neurological: Positive for weakness. Negative for dizziness, tremors, speech change, focal weakness, seizures and headaches.  Endo/Heme/Allergies: Negative.  Does not bruise/bleed easily.  Psychiatric/Behavioral: Negative.  Negative for depression, hallucinations and suicidal ideas.    DRUG ALLERGIES:  No Known Allergies VITALS:  Blood pressure 117/65, pulse (!) 48, temperature 98.4 F (36.9 C), temperature source Oral, resp. rate (!) 21, height 5\' 7"  (1.702 m), weight (!) 160.2 kg (353 lb 3.2 oz), SpO2 92 %. PHYSICAL EXAMINATION:  Physical Exam. Physical Exam  Constitutional: She is oriented to person,  place, and time and well-developed, well-nourished, and in no distress. No distress.  HENT:  Head: Normocephalic.  Eyes: No scleral icterus.  Neck: Normal range of motion. Neck supple. No JVD present. No tracheal deviation present.  Cardiovascular: Normal rate, regular rhythm and normal heart sounds.  Exam reveals no gallop and no friction rub.   No murmur heard. Tachycardia with irregular irregular heartbeat  Pulmonary/Chest: Effort normal. No respiratory distress. She has mild diffuse expiratory wheezes. She has no rales. She exhibits no tenderness.  Abdominal: Soft. Bowel sounds are normal. She exhibits no distension. There is no tenderness. There is no rebound and no guarding.  Obese  Musculoskeletal: Normal range of motion. She exhibits mild bilateral lower extremity edema to mid calf.  Neurological: he is alert and oriented to person, place, and time.  Skin: Skin is warm. No rash noted. No erythema.  Psychiatric: Affect and judgment normal.   LABORATORY PANEL:   CBC  Recent Labs Lab 03/21/16 0531  WBC 6.1  HGB 8.8*  HCT 29.3*  PLT 384   ------------------------------------------------------------------------------------------------------------------ Chemistries   Recent Labs Lab 03/20/16 1221 03/21/16 0531  NA 138 138  K 3.4* 4.1  CL 97* 98*  CO2 34* 31  GLUCOSE 164* 201*  BUN 24* 28*  CREATININE 1.09* 1.21*  CALCIUM 8.7* 8.7*  AST 18  --   ALT 9*  --   ALKPHOS 69  --   BILITOT 1.2  --    RADIOLOGY:  Dg Chest 1 View  Result Date: 03/21/2016 CLINICAL DATA:  CHF EXAM: CHEST 1 VIEW COMPARISON:  03/20/2016 FINDINGS: Cardiomegaly with vascular congestion. Bibasilar airspace opacities are noted, likely atelectasis. Mild interstitial prominence. Findings may  reflect edema/ CHF. No real change. IMPRESSION: Continued cardiomegaly.  Question mild interstitial edema. Bibasilar atelectasis. Electronically Signed   By: Rolm Baptise M.D.   On: 03/21/2016 07:17   Dg  Chest 1 View  Result Date: 03/20/2016 CLINICAL DATA:  Shortness of breath. EXAM: CHEST 1 VIEW COMPARISON:  None. FINDINGS: Mild cardiomegaly is noted. No pneumothorax or pleural effusion is noted. Bony thorax is unremarkable. Mild bibasilar subsegmental atelectasis is noted. IMPRESSION: Mild cardiomegaly.  Mild bibasilar subsegmental atelectasis. Electronically Signed   By: Marijo Conception, M.D.   On: 03/20/2016 14:09   US Venous Img Lower Bilateral  Result Date: 03/20/2016 CLINICAL DATA:  Lower extremity edema. EXAM: BILATERAL LOWER EXTREMITY VENOUS DOPPLER ULTRASOUND TECHNIQUE: Gray-scale sonography with graded compression, as well as color Doppler and duplex ultrasound were performed to evaluate the lower extremity deep venous systems from the level of the common femoral vein and including the common femoral, femoral, profunda femoral, popliteal and calf veins including the posterior tibial, peroneal and gastrocnemius veins when visible. The superficial great saphenous vein was also interrogated. Spectral Doppler was utilized to evaluate flow at rest and with distal augmentation maneuvers in the common femoral, femoral and popliteal veins. COMPARISON:  None. FINDINGS: RIGHT LOWER EXTREMITY Technically challenging examination due to body habitus. Normal compressibility, augmentation and color Doppler flow in the right common femoral vein, right femoral vein and right popliteal vein. The right saphenofemoral junction is patent. Right profunda femoral vein is patent without thrombus. Right deep calf veins could not confidently evaluated. LEFT LOWER EXTREMITY Normal compressibility, augmentation and color Doppler flow in the left common femoral vein, left femoral vein and left popliteal vein. The left saphenofemoral junction is patent. Left profunda femoral vein is patent without thrombus. Limited evaluation of the left calf veins. IMPRESSION: Technically challenging examination due to body habitus. No  evidence for deep venous thrombosis in the visualized bilateral lower extremities. Bilateral calf veins could not be evaluated. Electronically Signed   By: Markus Daft M.D.   On: 03/20/2016 16:55   ASSESSMENT AND PLAN:   65 year old female with morbid obesity, diabetes and essential hypertension who was admitted yesterday with hypercarbic respiratory failure and new onset of atrial fibrillation.  1. Acute hypoxic/hypercarbic respiratory failure possibly secondary to acute bronchitis. Continue IV Levaquin, IV steroids, nebulizers as needed. Repeat ABG shows modest improvement in CO2 but significant improvement in O2.  Patient would most benefit from pulmonary evaluation for consideration of obesity hypoventilation syndrome and outpatient sleep evaluation at discharge.   2. New onset atrial fibrillation in the setting of problem #1: Start IV diltiazem drip with goal heart rate less than 100 Start aspirin and consider anticoagulation if echocardiogram does not show major valvular dysfunction as etiology for atrial fibrillation. CHADS VASC score >3. [facility] cardiology consultation pending.  Check TSH. Echocardiogram pending.  3. Anemia, unknown type, no prior reference labs.: Check iron panel  4. Essential hypertension: Patient is on diltiazem drip due to problem #2.  5. Diabetes: Patient will be on sliding scale insulin and outpatient medications Diabetes coordinator consult  6. Hypokalemia, mild: Replete and recheck in a.m.  7. Lower extremity edema is improving. Continue IV Lasix, echocardiogram is pending. Lower extremity Doppler was an incomplete study very to bilateral calf veins could not be assessed for DVT. We'll continue chemoprophylaxis for DVT and repeat study.   8. Morbid obesity: ENCOURAGE WEIGHT LOSS AND DIET CONTROL. COnsult dietitian.   All the records are reviewed and case discussed with ED provider.  Management plans discussed with the patient and she  is  in agreement  CODE STATUS: DNR  TOTAL TIME TAKING CARE OF THIS PATIENT: 45 minutes.   More than 50% of the time was spent in counseling/coordination of care: YES  POSSIBLE D/C IN 2-3 DAYS, DEPENDING ON CLINICAL CONDITION.   Oisin Yoakum D.O. on 03/21/2016 at 11:59 AM  Between 7am to 6pm - Pager - [phone]  After 6pm go to [email or web address] - Technical brewer [facility]  Office  [phone]  CC: Primary care physician; No PCP Per Patient  Note: This dictation was prepared with Dragon dictation along with smaller phrase technology. Any transcriptional errors that result from this process are unintentional.

## 2016-03-21 NOTE — Progress Notes (Signed)
Pt placed on 2L [place] due to low O2 saturations.

## 2016-03-21 NOTE — Progress Notes (Signed)
cardizem drip continues at 11 mg/hr,expiratory wheezes and breathing treatment given,generakized edema,iv lasix continues,iv antibiotics continues,atrial fibrillation

## 2016-03-22 ENCOUNTER — Inpatient Hospital Stay: Payer: Medicare HMO

## 2016-03-22 LAB — BLOOD GAS, ARTERIAL
Acid-Base Excess: 8.6 mmol/L — ABNORMAL HIGH (ref 0.0–2.0)
Bicarbonate: 34.9 mmol/L — ABNORMAL HIGH (ref 20.0–28.0)
FIO2: 0.32
O2 Saturation: 97.2 %
Patient temperature: 37
pCO2 arterial: 59 mmHg — ABNORMAL HIGH (ref 32.0–48.0)
pH, Arterial: 7.38 (ref 7.350–7.450)
pO2, Arterial: 95 mmHg (ref 83.0–108.0)

## 2016-03-22 LAB — BASIC METABOLIC PANEL
Anion gap: 11 (ref 5–15)
BUN: 39 mg/dL — ABNORMAL HIGH (ref 6–20)
CO2: 28 mmol/L (ref 22–32)
Calcium: 8.7 mg/dL — ABNORMAL LOW (ref 8.9–10.3)
Chloride: 99 mmol/L — ABNORMAL LOW (ref 101–111)
Creatinine, Ser: 1.38 mg/dL — ABNORMAL HIGH (ref 0.44–1.00)
GFR calc Af Amer: 45 mL/min — ABNORMAL LOW (ref >60)
GFR calc non Af Amer: 39 mL/min — ABNORMAL LOW (ref >60)
Glucose, Bld: 190 mg/dL — ABNORMAL HIGH (ref 65–99)
Potassium: 4.6 mmol/L (ref 3.5–5.1)
Sodium: 138 mmol/L (ref 135–145)

## 2016-03-22 LAB — CBC
HCT: 27.5 % — ABNORMAL LOW (ref 35.0–47.0)
Hemoglobin: 8.3 g/dL — ABNORMAL LOW (ref 12.0–16.0)
MCH: 22.3 pg — ABNORMAL LOW (ref 26.0–34.0)
MCHC: 30.2 g/dL — ABNORMAL LOW (ref 32.0–36.0)
MCV: 73.9 fL — ABNORMAL LOW (ref 80.0–100.0)
Platelets: 270 10*3/uL (ref 150–440)
RBC: 3.72 MIL/uL — ABNORMAL LOW (ref 3.80–5.20)
RDW: 17 % — ABNORMAL HIGH (ref 11.5–14.5)
WBC: 10.4 10*3/uL (ref 3.6–11.0)

## 2016-03-22 LAB — GLUCOSE, CAPILLARY
Glucose-Capillary: 186 mg/dL — ABNORMAL HIGH (ref 65–99)
Glucose-Capillary: 224 mg/dL — ABNORMAL HIGH (ref 65–99)
Glucose-Capillary: 155 mg/dL — ABNORMAL HIGH (ref 65–99)
Glucose-Capillary: 140 mg/dL — ABNORMAL HIGH (ref 65–99)

## 2016-03-22 LAB — LIPID PANEL
Cholesterol: 120 mg/dL (ref 0–200)
HDL: 29 mg/dL — ABNORMAL LOW (ref >40)
LDL Cholesterol: 71 mg/dL (ref 0–99)
Total CHOL/HDL Ratio: 4.1 RATIO
Triglycerides: 102 mg/dL (ref <150)
VLDL: 20 mg/dL (ref 0–40)

## 2016-03-22 LAB — MAGNESIUM: Magnesium: 1.8 mg/dL (ref 1.7–2.4)

## 2016-03-22 MED ORDER — ATENOLOL 50 MG PO TABS
100.0000 mg | ORAL_TABLET | Freq: Every day | ORAL | Status: DC
  Administered 2016-03-22 – 2016-03-23 (×2): 100 mg via ORAL
  Filled 2016-03-22 (×2): qty 4
  Filled 2016-03-22: qty 2

## 2016-03-22 MED ORDER — DEXTROSE 5 % IV SOLN
500.0000 mg | INTRAVENOUS | Status: DC
  Administered 2016-03-22 – 2016-03-23 (×2): 500 mg via INTRAVENOUS
  Filled 2016-03-22 (×5): qty 500

## 2016-03-22 MED ORDER — LORAZEPAM 2 MG/ML IJ SOLN
0.2500 mg | Freq: Once | INTRAMUSCULAR | Status: AC
  Administered 2016-03-22: 0.25 mg via INTRAVENOUS
  Filled 2016-03-22: qty 1

## 2016-03-22 MED ORDER — ATENOLOL 25 MG PO TABS: 12.5000 mg | ORAL_TABLET | Freq: Every day | ORAL | Status: DC

## 2016-03-22 MED ORDER — FERROUS SULFATE 325 (65 FE) MG PO TABS
325.0000 mg | ORAL_TABLET | Freq: Three times a day (TID) | ORAL | Status: DC
  Administered 2016-03-22 (×2): 325 mg via ORAL
  Filled 2016-03-22 (×3): qty 1

## 2016-03-22 MED ORDER — METHYLPREDNISOLONE SODIUM SUCC 125 MG IJ SOLR
60.0000 mg | Freq: Four times a day (QID) | INTRAMUSCULAR | Status: DC
  Administered 2016-03-22 – 2016-03-23 (×6): 60 mg via INTRAVENOUS
  Filled 2016-03-22 (×6): qty 2

## 2016-03-22 MED ORDER — ASPIRIN EC 81 MG PO TBEC: 81.0000 mg | DELAYED_RELEASE_TABLET | Freq: Every day | ORAL | Status: DC

## 2016-03-22 MED ORDER — HYDROCHLOROTHIAZIDE 25 MG PO TABS
25.0000 mg | ORAL_TABLET | Freq: Every day | ORAL | Status: DC
  Administered 2016-03-22: 25 mg via ORAL
  Filled 2016-03-22: qty 1

## 2016-03-22 MED ORDER — SODIUM CHLORIDE 0.9 % IV SOLN
INTRAVENOUS | Status: DC
  Administered 2016-03-22 – 2016-03-23 (×2): via INTRAVENOUS

## 2016-03-22 MED ORDER — LISINOPRIL-HYDROCHLOROTHIAZIDE 20-25 MG PO TABS: 1.0000 | ORAL_TABLET | Freq: Every day | ORAL | Status: DC

## 2016-03-22 NOTE — Progress Notes (Signed)
Patient fight bipap, wants off Patient is now less lethargic than before. SVN given. 3liters in use post tx. Updated RN.bipap on standby

## 2016-03-22 NOTE — Progress Notes (Signed)
Continues on iv antibiotics and iv steriods,breathing improved today,remains on 2 liters of oxygenseen by cardiologist and pulmonologist this shift and medications revised,remains on contact isolation for lice.

## 2016-03-22 NOTE — Progress Notes (Signed)
Pt had CXR, blood gases completed as ordered. Pt was placed on bipap at 0230 due to continued dyspnea and wheezing. Pt hr remained consistent afib rate 65-95, cardiazem drip titrated down to 5 then stopped at 0300. Pt was given .25mg  ativan IV for anxiety. Pt woke up for breathing treatment and was agitated with bipap. Bipap removed per RT. Pt more alert and communicating clearly. Reports she is breathing better and feeling better. Face remains flushed at times.  [id number].

## 2016-03-22 NOTE — Plan of Care (Signed)
Problem: Food- and Nutrition-Related Knowledge Deficit (NB-1.1) Goal: Nutrition education Formal process to instruct or train a patient/client in a skill or to impart knowledge to help patients/clients voluntarily manage or modify food choices and eating behavior to maintain or improve health. Outcome: Completed/Met Date Met: 03/22/16  RD consulted for nutrition education regarding weight loss.  Body mass index is 55.32 kg/m. Pt meets criteria for obesity class III based on current BMI.  RD provided "Weight Loss Tips" handout from the [employer] of [employer]. Emphasized the importance of serving sizes and provided examples of correct portions of common foods. Discussed importance of controlled and consistent intake throughout the day. Provided examples of ways to balance meals/snacks and encouraged intake of high-fiber, whole grain complex carbohydrates. Emphasized the importance of hydration with calorie-free beverages and limiting sugar-sweetened beverages. Encouraged pt to discuss physical activity options with physician. Teach back method used.  Expect fair compliance.  Current diet order is heart healthy/carb modified, patient is consuming approximately 100% of meals at this time. Labs and medications reviewed. No further nutrition interventions warranted at this time. RD contact information provided. If additional nutrition issues arise, please re-consult RD.  Satira Anis. Wynn Kernes, MS, RD LDN Inpatient Clinical Dietitian Pager [phone]

## 2016-03-22 NOTE — Consult Note (Signed)
[facility] Consultation Note  Patient ID: [name], MRN: [id number], DOB/AGE: March 10, 1951 65 y.o. Admit date: 03/20/2016   Date of Consult: 03/22/2016 Primary Physician: No PCP Per Patient Primary Cardiologist: None  Chief Complaint:  Chief Complaint  Patient presents with  . Altered Mental Status  . Urinary Incontinence   Reason for Consult: atrial fibrillation  HPI: 65 y.o. female with morbid obesity and chronic kidney disease stage III with sleep apnea and likely hypoxia as well as essential hypertension makes hyperlipidemia and diabetes with complication having worsening shortness of breath with and without physical activity. The patient is unable to do any physical activity at all and has had some cough and congestion and shortness of breath over the last several days. When seen in the emergency room the patient did have atrial fibrillation with controlled ventricular rate previously on atenolol. Additionally the patient has had hypertension relatively well controlled on lisinopril. There is also no current evidence of myocardial infarction with normal troponin. With the appropriate medication management patient has had some improvements of these symptoms. Currently she does need further aggressive treatment of her risk factors for cardiovascular disease and atrial fibrillation. This will include continued heart rate control of atrial fibrillation although will have some concern with anticoagulation for further risk reduction in stroke with atrial fibrillation due to significant anemia  Past Medical History:  Diagnosis Date  . Diabetes mellitus without complication ([place])   . Hypertension   . Stroke [facility]) 2013      Surgical History: History reviewed. No pertinent surgical history.   Home Meds: Prior to Admission medications   Medication Sig Start Date End Date Taking? Authorizing Provider  aspirin EC 81 MG tablet Take 81 mg by mouth daily.   Yes Historical Provider,  MD  atenolol (TENORMIN) 100 MG tablet Take 100 mg by mouth daily.   Yes Historical Provider, MD  lisinopril-hydrochlorothiazide (PRINZIDE,ZESTORETIC) 20-25 MG tablet Take 1 tablet by mouth daily.   Yes Historical Provider, MD  metFORMIN (GLUCOPHAGE) 500 MG tablet Take 500 mg by mouth daily with breakfast.   Yes Historical Provider, MD    Inpatient Medications:  . aspirin EC  325 mg Oral Daily  . azithromycin  500 mg Intravenous Q24H  . furosemide  40 mg Intravenous Daily  . heparin  5,000 Units Subcutaneous Q8H  . insulin aspart  0-20 Units Subcutaneous TID WC  . insulin aspart  0-5 Units Subcutaneous QHS  . ipratropium-albuterol  3 mL Nebulization Q4H  . methylPREDNISolone (SOLU-MEDROL) injection  60 mg Intravenous Q6H   . diltiazem (CARDIZEM) infusion 5 mg/hr (03/22/16 0043)    Allergies: No Known Allergies  Social History   Social History  . Marital status: Married    Spouse name: N/A  . Number of children: N/A  . Years of education: N/A   Occupational History  . Not on file.   Social History Main Topics  . Smoking status: Never Smoker  . Smokeless tobacco: Never Used  . Alcohol use No  . Drug use: No  . Sexual activity: Not on file   Other Topics Concern  . Not on file   Social History Narrative  . No narrative on file     History reviewed. No pertinent family history.   Review of Systems Positive for Shortness of breath cough congestion Negative for: General:  chills, fever, night sweats or weight changes.  Cardiovascular: PND orthopnea syncope dizziness  Dermatological skin lesions rashes Respiratory: Positive for Cough congestion Urologic: Frequent  urination urination at night and hematuria Abdominal: negative for nausea, vomiting, diarrhea, bright red blood per rectum, melena, or hematemesis Neurologic: negative for visual changes, and/or hearing changes  All other systems reviewed and are otherwise negative except as noted above.  Labs:  Recent  Labs  03/20/16 1221  TROPONINI <0.03   Lab Results  Component Value Date   WBC 10.4 03/22/2016   HGB 8.3 (L) 03/22/2016   HCT 27.5 (L) 03/22/2016   MCV 73.9 (L) 03/22/2016   PLT 270 03/22/2016    Recent Labs Lab 03/20/16 1221 03/21/16 0531  NA 138 138  K 3.4* 4.1  CL 97* 98*  CO2 34* 31  BUN 24* 28*  CREATININE 1.09* 1.21*  CALCIUM 8.7* 8.7*  PROT 7.3  --   BILITOT 1.2  --   ALKPHOS 69  --   ALT 9*  --   AST 18  --   GLUCOSE 164* 201*   No results found for: CHOL, HDL, LDLCALC, TRIG No results found for: DDIMER  Radiology/Studies:  Dg Chest 1 View  Result Date: 03/21/2016 CLINICAL DATA:  CHF EXAM: CHEST 1 VIEW COMPARISON:  03/20/2016 FINDINGS: Cardiomegaly with vascular congestion. Bibasilar airspace opacities are noted, likely atelectasis. Mild interstitial prominence. Findings may reflect edema/ CHF. No real change. IMPRESSION: Continued cardiomegaly.  Question mild interstitial edema. Bibasilar atelectasis. Electronically Signed   By: Rolm Baptise M.D.   On: 03/21/2016 07:17   Dg Chest 1 View  Result Date: 03/20/2016 CLINICAL DATA:  Shortness of breath. EXAM: CHEST 1 VIEW COMPARISON:  None. FINDINGS: Mild cardiomegaly is noted. No pneumothorax or pleural effusion is noted. Bony thorax is unremarkable. Mild bibasilar subsegmental atelectasis is noted. IMPRESSION: Mild cardiomegaly.  Mild bibasilar subsegmental atelectasis. Electronically Signed   By: Marijo Conception, M.D.   On: 03/20/2016 14:09   US Venous Img Lower Bilateral  Result Date: 03/20/2016 CLINICAL DATA:  Lower extremity edema. EXAM: BILATERAL LOWER EXTREMITY VENOUS DOPPLER ULTRASOUND TECHNIQUE: Gray-scale sonography with graded compression, as well as color Doppler and duplex ultrasound were performed to evaluate the lower extremity deep venous systems from the level of the common femoral vein and including the common femoral, femoral, profunda femoral, popliteal and calf veins including the posterior  tibial, peroneal and gastrocnemius veins when visible. The superficial great saphenous vein was also interrogated. Spectral Doppler was utilized to evaluate flow at rest and with distal augmentation maneuvers in the common femoral, femoral and popliteal veins. COMPARISON:  None. FINDINGS: RIGHT LOWER EXTREMITY Technically challenging examination due to body habitus. Normal compressibility, augmentation and color Doppler flow in the right common femoral vein, right femoral vein and right popliteal vein. The right saphenofemoral junction is patent. Right profunda femoral vein is patent without thrombus. Right deep calf veins could not confidently evaluated. LEFT LOWER EXTREMITY Normal compressibility, augmentation and color Doppler flow in the left common femoral vein, left femoral vein and left popliteal vein. The left saphenofemoral junction is patent. Left profunda femoral vein is patent without thrombus. Limited evaluation of the left calf veins. IMPRESSION: Technically challenging examination due to body habitus. No evidence for deep venous thrombosis in the visualized bilateral lower extremities. Bilateral calf veins could not be evaluated. Electronically Signed   By: Markus Daft M.D.   On: 03/20/2016 16:55   Dg Chest Port 1 View  Result Date: 03/22/2016 CLINICAL DATA:  Acute onset of shortness of breath and cough. Initial encounter. EXAM: PORTABLE CHEST 1 VIEW COMPARISON:  Chest radiograph performed 03/21/2016 FINDINGS: The  lungs are well-aerated. A small left pleural effusion is noted. Vascular congestion is noted, with increased interstitial markings, concerning for pulmonary edema. Pneumonia could have a similar appearance. No pneumothorax is seen. The cardiomediastinal silhouette is mildly enlarged. No acute osseous abnormalities are identified. IMPRESSION: Small left pleural effusion. Vascular congestion and mild cardiomegaly, with increased interstitial markings, concerning for pulmonary edema.  Pneumonia could have a similar appearance. Electronically Signed   By: Garald Balding M.D.   On: 03/22/2016 01:08    EKG: Atrial fibrillation with poor R-wave progression and nonspecific ST changes with a reasonable heart rate control  Weights: Filed Weights   03/20/16 1201 03/20/16 1731  Weight: (!) 163.3 kg (360 lb) (!) 160.2 kg (353 lb 3.2 oz)     Physical Exam: Blood pressure 112/67, pulse 97, temperature 98.4 F (36.9 C), temperature source Oral, resp. rate (!) 21, height 5\' 7"  (1.702 m), weight (!) 160.2 kg (353 lb 3.2 oz), SpO2 98 %. Body mass index is 55.32 kg/m. General: Well developed, well nourished, in no acute distress. Head eyes ears nose throat: Normocephalic, atraumatic, sclera non-icteric, no xanthomas, nares are without discharge. No apparent thyromegaly and/or mass  Lungs: Normal respiratory effort.  Few wheezes, no rales, some rhonchi.  Heart: Irregular with normal S1 S2. no murmur gallop, no rub, PMI is normal size and placement, carotid upstroke normal without bruit, jugular venous pressure is normal Abdomen: Soft, non-tender, non-distended with normoactive bowel sounds. No hepatomegaly. No rebound/guarding. No obvious abdominal masses. Abdominal aorta is normal size without bruit Extremities: 1+ edema. no cyanosis, no clubbing, no ulcers  Peripheral : 2+ bilateral upper extremity pulses, 0 + bilateral femoral pulses, 0 + bilateral dorsal pedal pulse Neuro: Alert and oriented. No facial asymmetry. No focal deficit. Moves all extremities spontaneously. Musculoskeletal: Normal muscle tone without kyphosis Psych:  Responds to questions appropriately with a normal affect.    Assessment: 65 year old female with essential hypertension mixed hyperlipidemia diabetes with complication chronic kidney disease stage III sleep apnea and anemia with cough congestion shortness of breath multifactorial in nature with atrial fibrillation with controlled ventricular rate needing  further treatment options  Plan: 1. Continue hypertension control with lisinopril hydrochlorothiazide as able 2. Heart rate control of atrial fibrillation with atenolol and adjustments of dosage depending on above with a goal heart rate between 60 and 90 bpm at rest 3. Continue treatment of sleep apnea likely promoting atrial fibrillation with new shortness of breath and congestion likely making heart rate faster 4. Further investigation of causes of anemia and further treatment thereof is necessary 5. Avoid anticoagulation at this time due to concerns of significant anemia and bleeding complications until further assessment and treatment of anemia 6. Begin ambulation and adjustments of medications with possible discharge to home after stabilization of above with follow-up with adjustments of medications in 2 weeks  Signed, Corey Skains M.D. [facility] 03/22/2016, 6:46 AM

## 2016-03-22 NOTE — Progress Notes (Addendum)
Pt having difficulty breathing. Face flushed, expiratory/inspiratory wheezing, breath sounds diminished. Notified Dr.sparks at 21:00 Order to repeat nebulizer treatment and IV lasix 20mg  x1.

## 2016-03-22 NOTE — Consult Note (Signed)
Pulmonary Critical Care  Initial Consult Note   Brittney Shepard [id number] DOB: [birth year] DOA: 03/20/2016  Referring physician: Bettey Costa, MD PCP: No PCP Per Patient   Chief Complaint: respiratory failure  HPI: Brittney Shepard is a 65 y.o. female with prior history of morbid obesity DM HTN preseted with increased SOB and cough and wheeze. Patient states apparently no prior history of COPD> Patient has not been diagnosed with OSA either but is morbidly obese. Patient was seen in the office and was sent home. Came back into the ER with worsening symptoms. Patient was started on steroids and abx and is now slowly improving. Patient had an ABG done which shows some improvement in her pO2 and her pH appears to be well compensated. She had a heart rate that too was elevated and has been seen by cardiology. She does appear to have paroxysmal atrial fib. Never been worked up for OSA   Review of Systems:  Constitutional:  No weight loss, Fevers, chills, +fatigue.  HEENT:  No headaches, nasal congestion, post nasal drip,  Cardio-vascular:  No chest pain, +swelling in lower extremities, +palpitations  GI:  No heartburn, indigestion, abdominal pain, nausea, vomiting, diarrhea  Resp:  +shortness of breath no productive cough, No coughing up of blood. +wheezing Skin:  no rash or lesions.  Musculoskeletal:  No joint pain or swelling.   Remainder ROS performed and is unremarkable other than noted in HPI  Past Medical History:  Diagnosis Date  . Diabetes mellitus without complication ([place])   . Hypertension   . Stroke [facility]) 2013   History reviewed. No pertinent surgical history. Social History:  reports that she has never smoked. She has never used smokeless tobacco. She reports that she does not drink alcohol or use drugs.  No Known Allergies  History reviewed. No pertinent family history.  Prior to Admission medications   Medication Sig Start Date End Date Taking? Authorizing Provider  aspirin  EC 81 MG tablet Take 81 mg by mouth daily.   Yes Historical Provider, MD  atenolol (TENORMIN) 100 MG tablet Take 100 mg by mouth daily.   Yes Historical Provider, MD  lisinopril-hydrochlorothiazide (PRINZIDE,ZESTORETIC) 20-25 MG tablet Take 1 tablet by mouth daily.   Yes Historical Provider, MD  metFORMIN (GLUCOPHAGE) 500 MG tablet Take 500 mg by mouth daily with breakfast.   Yes Historical Provider, MD   Physical Exam: Vitals:   03/22/16 0420 03/22/16 0700 03/22/16 0807 03/22/16 1210  BP: 128/84 (!) 116/57 125/83   Pulse: 85 (!) 113 (!) 102   Resp: 14 20 20    Temp: 98 F (36.7 C) 98 F (36.7 C) 97.8 F (36.6 C)   TempSrc: Oral Oral Oral   SpO2: 97% 97% 100% 99%  Weight:      Height:        Wt Readings from Last 3 Encounters:  03/20/16 (!) 160.2 kg (353 lb 3.2 oz)    General:  Appears calm and comfortable Eyes: PERRL, normal lids, irises & conjunctiva ENT: grossly normal hearing, lips & tongue Neck: no LAD, masses or thyromegaly Cardiovascular: IRR, no m/r/g. No LE edema. Respiratory: few rhonchi. Normal respiratory effort. Abdomen: soft, nontender Skin: no rash or induration seen on limited exam Musculoskeletal: grossly normal tone BUE/BLE Psychiatric: grossly normal mood and affect Neurologic: grossly non-focal.          Labs on Admission:  Basic Metabolic Panel:  Recent Labs Lab 03/20/16 1221 03/21/16 0531 03/22/16 0601  NA 138 138 138  K 3.4*  4.1 4.6  CL 97* 98* 99*  CO2 34* 31 28  GLUCOSE 164* 201* 190*  BUN 24* 28* 39*  CREATININE 1.09* 1.21* 1.38*  CALCIUM 8.7* 8.7* 8.7*  MG  --   --  1.8   Liver Function Tests:  Recent Labs Lab 03/20/16 1221  AST 18  ALT 9*  ALKPHOS 69  BILITOT 1.2  PROT 7.3  ALBUMIN 3.1*   No results for input(s): LIPASE, AMYLASE in the last 168 hours. No results for input(s): AMMONIA in the last 168 hours. CBC:  Recent Labs Lab 03/20/16 1221 03/21/16 0531 03/22/16 0601  WBC 8.1 6.1 10.4  NEUTROABS 6.4  --   --     HGB 9.2* 8.8* 8.3*  HCT 30.0* 29.3* 27.5*  MCV 74.0* 74.1* 73.9*  PLT 338 384 270   Cardiac Enzymes:  Recent Labs Lab 03/20/16 1221  TROPONINI <0.03    BNP (last 3 results)  Recent Labs  03/20/16 1506  BNP 160.0*    ProBNP (last 3 results) No results for input(s): PROBNP in the last 8760 hours.  CBG:  Recent Labs Lab 03/21/16 1153 03/21/16 1641 03/21/16 2118 03/22/16 0738 03/22/16 1201  GLUCAP 235* 233* 174* 186* 224*    Radiological Exams on Admission: Dg Chest 1 View  Result Date: 03/21/2016 CLINICAL DATA:  CHF EXAM: CHEST 1 VIEW COMPARISON:  03/20/2016 FINDINGS: Cardiomegaly with vascular congestion. Bibasilar airspace opacities are noted, likely atelectasis. Mild interstitial prominence. Findings may reflect edema/ CHF. No real change. IMPRESSION: Continued cardiomegaly.  Question mild interstitial edema. Bibasilar atelectasis. Electronically Signed   By: Rolm Baptise M.D.   On: 03/21/2016 07:17   US Venous Img Lower Bilateral  Result Date: 03/20/2016 CLINICAL DATA:  Lower extremity edema. EXAM: BILATERAL LOWER EXTREMITY VENOUS DOPPLER ULTRASOUND TECHNIQUE: Gray-scale sonography with graded compression, as well as color Doppler and duplex ultrasound were performed to evaluate the lower extremity deep venous systems from the level of the common femoral vein and including the common femoral, femoral, profunda femoral, popliteal and calf veins including the posterior tibial, peroneal and gastrocnemius veins when visible. The superficial great saphenous vein was also interrogated. Spectral Doppler was utilized to evaluate flow at rest and with distal augmentation maneuvers in the common femoral, femoral and popliteal veins. COMPARISON:  None. FINDINGS: RIGHT LOWER EXTREMITY Technically challenging examination due to body habitus. Normal compressibility, augmentation and color Doppler flow in the right common femoral vein, right femoral vein and right popliteal vein. The  right saphenofemoral junction is patent. Right profunda femoral vein is patent without thrombus. Right deep calf veins could not confidently evaluated. LEFT LOWER EXTREMITY Normal compressibility, augmentation and color Doppler flow in the left common femoral vein, left femoral vein and left popliteal vein. The left saphenofemoral junction is patent. Left profunda femoral vein is patent without thrombus. Limited evaluation of the left calf veins. IMPRESSION: Technically challenging examination due to body habitus. No evidence for deep venous thrombosis in the visualized bilateral lower extremities. Bilateral calf veins could not be evaluated. Electronically Signed   By: Markus Daft M.D.   On: 03/20/2016 16:55   Dg Chest Port 1 View  Result Date: 03/22/2016 CLINICAL DATA:  Acute onset of shortness of breath and cough. Initial encounter. EXAM: PORTABLE CHEST 1 VIEW COMPARISON:  Chest radiograph performed 03/21/2016 FINDINGS: The lungs are well-aerated. A small left pleural effusion is noted. Vascular congestion is noted, with increased interstitial markings, concerning for pulmonary edema. Pneumonia could have a similar appearance. No pneumothorax is  seen. The cardiomediastinal silhouette is mildly enlarged. No acute osseous abnormalities are identified. IMPRESSION: Small left pleural effusion. Vascular congestion and mild cardiomegaly, with increased interstitial markings, concerning for pulmonary edema. Pneumonia could have a similar appearance. Electronically Signed   By: Garald Balding M.D.   On: 03/22/2016 01:08       EKG: Independently reviewed.  Assessment/Plan Active Problems:   Atrial fibrillation ([place])   1. Shortness of Breath -she was started on abx -agree with nebs as you are -currently is on IV steroid will continue and begin slow taper -will need to see in the office and workup for COPD/Asthma  2. Likely OSA/OHS -based on her habitus and history likely has OSA -would benefit from  CPAP therapy but will need a PSG for proper diagnosis -try to maintain SaO2 around 88-90% due to chronic hypercapnea  3. Atrial Fib -cardiology following -rate controlled  4. Morbid Obesity -weight loss discussed at length  5. Pleural Effusion -patient is on diuresis -will monitor    Code Status: DNR  Disposition Plan: home   Time spent: 42min    I have personally obtained a history, examined the patient, evaluated laboratory and imaging results, formulated the assessment and plan and placed orders.  The Patient requires high complexity decision making for assessment and support.    Allyne Gee, MD [facility] Pulmonary Critical Care Medicine Sleep Medicine

## 2016-03-22 NOTE — Progress Notes (Signed)
Patient ID: [name], female   DOB: February 10, 1951, 65 y.o.   MRN: [id number]   [place] [facility] [place] NAME: [name]    MR#:  [id number]  DATE OF BIRTH:  [birth year]  SUBJECTIVE:  CHIEF COMPLAINT:   Chief Complaint  Patient presents with  . Altered Mental Status  . Urinary Incontinence  This is a 65 year old white female with CK D stage III, sleep apnea, morbid obesity, hypertension, hyperlipidemia, diabetes who is admitted with hypercarbic respiratory failure and new-onset of atrial fibrillation. Patient seen and examined at bedside. REVIEW OF SYSTEMS:  ROS  DRUG ALLERGIES:  No Known Allergies VITALS:  Blood pressure 125/83, pulse (!) 102, temperature 97.8 F (36.6 C), temperature source Oral, resp. rate 20, height 5\' 7"  (1.702 m), weight (!) 160.2 kg (353 lb 3.2 oz), SpO2 100 %. PHYSICAL EXAMINATION:  Physical Exam LABORATORY PANEL:   CBC  Recent Labs Lab 03/22/16 0601  WBC 10.4  HGB 8.3*  HCT 27.5*  PLT 270   ------------------------------------------------------------------------------------------------------------------ Chemistries   Recent Labs Lab 03/20/16 1221  03/22/16 0601  NA 138  < > 138  K 3.4*  < > 4.6  CL 97*  < > 99*  CO2 34*  < > 28  GLUCOSE 164*  < > 190*  BUN 24*  < > 39*  CREATININE 1.09*  < > 1.38*  CALCIUM 8.7*  < > 8.7*  MG  --   --  1.8  AST 18  --   --   ALT 9*  --   --   ALKPHOS 69  --   --   BILITOT 1.2  --   --   < > = values in this interval not displayed. RADIOLOGY:  Dg Chest Port 1 View  Result Date: 03/22/2016 CLINICAL DATA:  Acute onset of shortness of breath and cough. Initial encounter. EXAM: PORTABLE CHEST 1 VIEW COMPARISON:  Chest radiograph performed 03/21/2016 FINDINGS: The lungs are well-aerated. A small left pleural effusion is noted. Vascular congestion is noted, with increased interstitial markings, concerning for pulmonary edema. Pneumonia could have a similar appearance. No  pneumothorax is seen. The cardiomediastinal silhouette is mildly enlarged. No acute osseous abnormalities are identified. IMPRESSION: Small left pleural effusion. Vascular congestion and mild cardiomegaly, with increased interstitial markings, concerning for pulmonary edema. Pneumonia could have a similar appearance. Electronically Signed   By: Garald Balding M.D.   On: 03/22/2016 01:08   ASSESSMENT AND PLAN:   65 year old female with morbid obesity, diabetes and essential hypertension who was admitted yesterday with hypercarbic respiratory failure and new onset of atrial fibrillation.  1. Acute hypoxic/hypercarbic respiratory failure possibly secondary to acute bronchitis. Continue IV Levaquin, IV steroids, nebulizers as needed. Repeat ABG shows modest improvement in CO2 but significant improvement in O2.  Pulmonary consultation. Consideration is given to obesity hypoventilation syndrome.  2. New onset atrial fibrillation in the setting of problem #1: Patient remains tachycardic despite Cardizem drip. Rate control with atenolol little 60-90 rest. CHADS VASC score >3. Continue aspirin. Consider anticoagulation pending anemia workup. Cardiology consultation and recommendations appreciated. Check TSH. Echocardiogram is done and results are pending.  3. Acute kidney injury on chronic kidney disease-we'll initiate gentle IV fluid hydration and recheck BMP in the a.m. Hold lisinopril.  3. Microcytic Anemia,iron deficiency. We'll start oral iron.  4. Essential hypertension: Resume HCTZ.  Hold lisinopril for now 2/2 AKI.  5. Diabetes: Patient will be on sliding scale insulin and outpatient medications  Diabetes coordinator consult  6. Hypokalemia,mild: Replete and recheck in a.m.  7. Lower extremity edema is improving. Continue IV Lasix, echocardiogram is pending. Lower extremity Doppler was an incomplete study very to bilateral calf veins could not be assessed for DVT. We'll continue  chemoprophylaxis for DVT and repeat study.   8. Morbid obesity: ENCOURAGE WEIGHT LOSS AND DIET CONTROL. COnsult dietitian.   All the records are reviewed and case discussed with ED provider. Management plans discussed with the patient and she is in agreement  CODE STATUS: DNR  TOTAL TIME TAKING CARE OF THIS PATIENT: 18minutes.  More than 50% of the time was spent in counseling/coordination of care: YES  POSSIBLE D/C IN 1-2 DAYS, DEPENDING ON CLINICAL CONDITION.   Mairead Schwarzkopf D.O. on 03/22/2016 at 11:32 AM  Between 7am to 6pm - Pager - [phone]  After 6pm go to [email or web address] - [employer] [facility]  Office  [phone]  CC: Primary care physician; No PCP Per Patient  Note: This dictation was prepared with Dragon dictation along with smaller phrase technology. Any transcriptional errors that result from this process are unintentional.

## 2016-03-22 NOTE — Progress Notes (Signed)
Pt continues to have difficulty breathing. Breathing treatment was given at [facility]. Dr Jannifer Franklin notified by RT. Pt was seen by Dr Jannifer Franklin. New orders placed.

## 2016-03-23 ENCOUNTER — Inpatient Hospital Stay
Admit: 2016-03-23 | Discharge: 2016-03-23 | Disposition: A | Discharge status: Home / Self Care | Payer: Medicare HMO | Attending: Internal Medicine | Admitting: Internal Medicine

## 2016-03-23 ENCOUNTER — Inpatient Hospital Stay: Payer: Medicare HMO

## 2016-03-23 LAB — BLOOD GAS, ARTERIAL
Acid-Base Excess: 7.8 mmol/L — ABNORMAL HIGH (ref 0.0–2.0)
Bicarbonate: 36.8 mmol/L — ABNORMAL HIGH (ref 20.0–28.0)
FIO2: 0.32
O2 Saturation: 94.6 %
Patient temperature: 37
pCO2 arterial: 84 mmHg (ref 32.0–48.0)
pH, Arterial: 7.25 — ABNORMAL LOW (ref 7.350–7.450)
pO2, Arterial: 85 mmHg (ref 83.0–108.0)

## 2016-03-23 LAB — BASIC METABOLIC PANEL
Anion gap: 6 (ref 5–15)
BUN: 54 mg/dL — ABNORMAL HIGH (ref 6–20)
CO2: 33 mmol/L — ABNORMAL HIGH (ref 22–32)
Calcium: 8.7 mg/dL — ABNORMAL LOW (ref 8.9–10.3)
Chloride: 99 mmol/L — ABNORMAL LOW (ref 101–111)
Creatinine, Ser: 1.44 mg/dL — ABNORMAL HIGH (ref 0.44–1.00)
GFR calc Af Amer: 42 mL/min — ABNORMAL LOW (ref >60)
GFR calc non Af Amer: 37 mL/min — ABNORMAL LOW (ref >60)
Glucose, Bld: 140 mg/dL — ABNORMAL HIGH (ref 65–99)
Potassium: 4.9 mmol/L (ref 3.5–5.1)
Sodium: 138 mmol/L (ref 135–145)

## 2016-03-23 LAB — CBC
HCT: 29.2 % — ABNORMAL LOW (ref 35.0–47.0)
Hemoglobin: 8.8 g/dL — ABNORMAL LOW (ref 12.0–16.0)
MCH: 22.4 pg — ABNORMAL LOW (ref 26.0–34.0)
MCHC: 30.1 g/dL — ABNORMAL LOW (ref 32.0–36.0)
MCV: 74.5 fL — ABNORMAL LOW (ref 80.0–100.0)
Platelets: 381 10*3/uL (ref 150–440)
RBC: 3.93 MIL/uL (ref 3.80–5.20)
RDW: 17 % — ABNORMAL HIGH (ref 11.5–14.5)
WBC: 10.7 10*3/uL (ref 3.6–11.0)

## 2016-03-23 LAB — GLUCOSE, CAPILLARY
Glucose-Capillary: 136 mg/dL — ABNORMAL HIGH (ref 65–99)
Glucose-Capillary: 163 mg/dL — ABNORMAL HIGH (ref 65–99)
Glucose-Capillary: 128 mg/dL — ABNORMAL HIGH (ref 65–99)
Glucose-Capillary: 141 mg/dL — ABNORMAL HIGH (ref 65–99)

## 2016-03-23 MED ORDER — METHYLPREDNISOLONE SODIUM SUCC 125 MG IJ SOLR
60.0000 mg | Freq: Two times a day (BID) | INTRAMUSCULAR | Status: DC
  Administered 2016-03-23: 60 mg via INTRAVENOUS
  Filled 2016-03-23 (×2): qty 2

## 2016-03-23 MED ORDER — FERROUS SULFATE 325 (65 FE) MG PO TABS
325.0000 mg | ORAL_TABLET | Freq: Two times a day (BID) | ORAL | Status: DC
  Administered 2016-03-23 – 2016-03-27 (×8): 325 mg via ORAL
  Filled 2016-03-23 (×8): qty 1

## 2016-03-23 MED ORDER — IPRATROPIUM-ALBUTEROL 0.5-2.5 (3) MG/3ML IN SOLN
3.0000 mL | Freq: Four times a day (QID) | RESPIRATORY_TRACT | Status: DC
  Administered 2016-03-23 – 2016-03-26 (×13): 3 mL via RESPIRATORY_TRACT
  Filled 2016-03-23 (×14): qty 3

## 2016-03-23 MED ORDER — FUROSEMIDE 10 MG/ML IJ SOLN
40.0000 mg | Freq: Two times a day (BID) | INTRAMUSCULAR | Status: DC
  Administered 2016-03-23 – 2016-03-26 (×6): 40 mg via INTRAVENOUS
  Filled 2016-03-23 (×6): qty 4

## 2016-03-23 MED ORDER — ENOXAPARIN SODIUM 40 MG/0.4ML ~~LOC~~ SOLN
40.0000 mg | Freq: Two times a day (BID) | SUBCUTANEOUS | Status: DC
  Administered 2016-03-23 – 2016-03-27 (×8): 40 mg via SUBCUTANEOUS
  Filled 2016-03-23 (×8): qty 0.4

## 2016-03-23 NOTE — Progress Notes (Addendum)
Patient ID: Brittney Shepard, female   DOB: 02-04-1951, 65 y.o.   MRN: [id number]   [facility] [place] NAME: Brittney Shepard    MR#:  [id number]  DATE OF BIRTH:  April 09, 1951  SUBJECTIVE:  CHIEF COMPLAINT:   Chief Complaint  Patient presents with  . Altered Mental Status  . Urinary Incontinence  This is a 65 year old white female with CKD stage III, sleep apnea, morbid obesity, hypertension, hyperlipidemia, diabetes who is admitted with hypercarbic respiratory failure and new-onset of atrial fibrillation.  Feels better with her breathing. Still on 2 L oxygen. Off Cardizem drip. REVIEW OF SYSTEMS:  Review of Systems  Constitutional: Positive for malaise/fatigue. Negative for chills and fever.  HENT: Negative for sore throat.   Eyes: Negative for blurred vision, double vision and pain.  Respiratory: Positive for cough and shortness of breath. Negative for hemoptysis and wheezing.   Cardiovascular: Negative for chest pain, palpitations, orthopnea and leg swelling.  Gastrointestinal: Negative for abdominal pain, constipation, diarrhea, heartburn, nausea and vomiting.  Genitourinary: Negative for dysuria and hematuria.  Musculoskeletal: Negative for back pain and joint pain.  Skin: Negative for rash.  Neurological: Positive for weakness. Negative for sensory change, speech change, focal weakness and headaches.  Endo/Heme/Allergies: Does not bruise/bleed easily.  Psychiatric/Behavioral: Negative for depression. The patient is not nervous/anxious.     DRUG ALLERGIES:  No Known Allergies VITALS:  Blood pressure 109/62, pulse 81, temperature 97.7 F (36.5 C), temperature source Oral, resp. rate 18, height 5\' 7"  (1.702 m), weight (!) 160.2 kg (353 lb 3.2 oz), SpO2 93 %. PHYSICAL EXAMINATION:  Physical Exam  Physical Exam  Constitutional: obese. Awake and alert Head: Normocephalic.  Eyes: No scleral icterus.  Neck: Normal range of motion. Neck supple. No  JVD present. No tracheal deviation present.  Cardiovascular: Normal rate, regular rhythm and normal heart sounds.  Exam reveals no gallop and no friction rub.   No murmur heard. Tachycardia with irregular irregular heartbeat  Pulmonary/Chest: Effort normal. No respiratory distress. She has wheezes. She has no rales. She exhibits no tenderness.  Abdominal: Soft. Bowel sounds are normal. She exhibits no distension. There is no tenderness. There is no rebound and no guarding.  Obese  Musculoskeletal: Normal range of motion. She exhibits edema.   LABORATORY PANEL:   CBC  Recent Labs Lab 03/23/16 0430  WBC 10.7  HGB 8.8*  HCT 29.2*  PLT 381   ------------------------------------------------------------------------------------------------------------------ Chemistries   Recent Labs Lab 03/20/16 1221  03/22/16 0601 03/23/16 0430  NA 138  < > 138 138  K 3.4*  < > 4.6 4.9  CL 97*  < > 99* 99*  CO2 34*  < > 28 33*  GLUCOSE 164*  < > 190* 140*  BUN 24*  < > 39* 54*  CREATININE 1.09*  < > 1.38* 1.44*  CALCIUM 8.7*  < > 8.7* 8.7*  MG  --   --  1.8  --   AST 18  --   --   --   ALT 9*  --   --   --   ALKPHOS 69  --   --   --   BILITOT 1.2  --   --   --   < > = values in this interval not displayed. RADIOLOGY:  No results found. ASSESSMENT AND PLAN:   65 year old female with morbid obesity, diabetes and essential hypertension who was admitted yesterday with hypercarbic respiratory failure and new onset of  atrial fibrillation.  1. Acute hypoxic/hypercarbic respiratory failure secondary to acute bronchitis. Levaquin,steroids, nebulizers as needed. Pulmonary following patient  2. New onset atrial fibrillation in the setting of problem #1: Patient remains tachycardic despite Cardizem drip. Rate control with atenolol little 60-90 rest. CHADS VASC score >3. Continue aspirin. No anticoagulation due to anemia Cardiology consultation and recommendations appreciated. Echocardiogram  -results pending  3. CKD stage III stable  3. Microcytic Anemia,iron deficiency.  start oral iron.  4. Essential hypertension: Resume HCTZ.  Hold lisinopril for now 2/2 AKI.   5. Diabetes: Patient will be on sliding scale insulin and outpatient medications Uncontrolled due to steroids  6. Hypokalemia,mild: Replete and recheck in a.m.  7. Lower extremity edema is improving. Continue IV Lasix, echocardiogram is pending.  8. Morbid obesity: ENCOURAGE WEIGHT LOSS AND DIET CONTROL. Consult dietitian.   Discussed with patient in detail and answered all questions.  CODE STATUS: DNR  TOTAL TIME TAKING CARE OF THIS PATIENT: 43minutes.   POSSIBLE D/C IN 1-2 DAYS, DEPENDING ON CLINICAL CONDITION.   Hillary Bow R MD. on 03/23/2016 at 10:03 AM  Between 7am to 6pm - Pager - [phone]  After 6pm go to [email or web address] - Technical brewer  Hospitalists  Office  [phone]  CC: Primary care physician; No PCP Per Patient  Note: This dictation was prepared with Dragon dictation along with smaller phrase technology. Any transcriptional errors that result from this process are unintentional.

## 2016-03-23 NOTE — Progress Notes (Signed)
Patient has done well all night off bipap.  Breathing has been stable. She has not been as lethargic as previously seen. She is has been able to answer the majority of my questions correctly

## 2016-03-23 NOTE — Progress Notes (Signed)
*  PRELIMINARY RESULTS* Echocardiogram 2D Echocardiogram has been performed.  Brittney Shepard 03/23/2016, 9:49 AM

## 2016-03-23 NOTE — Progress Notes (Signed)
[place], NP aware that patient has no IV access after multiple attempts.

## 2016-03-23 NOTE — Progress Notes (Signed)
[facility] Cardiology [facility] Encounter Note  Patient: Brittney Shepard / Admit Date: 03/20/2016 / Date of Encounter: 03/23/2016, 8:13 AM   Subjective: Patient is still very short of breath weak and fatigued. Patient has had cough and congestion. Hypertension better controlled  Review of Systems: Positive for: Shortness of breath weak and fatigue and lower extremity edema Negative for: Vision change, hearing change, syncope, dizziness, nausea, vomiting,diarrhea, bloody stool, stomach pain, cough, congestion, diaphoresis, urinary frequency, urinary pain,skin lesions, skin rashes Others previously listed  Objective: Telemetry: Atrial fibrillation with controlled ventricular rate Physical Exam: Blood pressure 109/62, pulse 81, temperature 97.7 F (36.5 C), temperature source Oral, resp. rate 18, height 5\' 7"  (1.702 m), weight (!) 160.2 kg (353 lb 3.2 oz), SpO2 93 %. Body mass index is 55.32 kg/m. General: Well developed, well nourished, in no acute distress. Head: Normocephalic, atraumatic, sclera non-icteric, no xanthomas, nares are without discharge. Neck: No apparent masses Lungs: Normal respirations with some wheezes, few rhonchi, no rales , no crackles   Heart: Irregular rate and rhythm, normal S1 S2, no murmur, no rub, no gallop, PMI is normal size and placement, carotid upstroke normal without bruit, jugular venous pressure normal Abdomen: Soft, non-tender,  distended with normoactive bowel sounds. No hepatosplenomegaly. Abdominal aorta is normal size without bruit Extremities: 1+ edema, no clubbing, no cyanosis, no ulcers,  Peripheral: 2+ radial, 2+ femoral, 2+ dorsal pedal pulses Neuro: Alert and oriented. Moves all extremities spontaneously. Psych:  Responds to questions appropriately with a normal affect.   Intake/Output Summary (Last 24 hours) at 03/23/16 0813 Last data filed at 03/23/16 0400  Gross per 24 hour  Intake          1741.25 ml  Output                0 ml  Net           1741.25 ml    Inpatient Medications:  . aspirin EC  325 mg Oral Daily  . atenolol  100 mg Oral Daily  . azithromycin  500 mg Intravenous Q24H  . ferrous sulfate  325 mg Oral BID WC  . furosemide  40 mg Intravenous BID  . heparin  5,000 Units Subcutaneous Q8H  . insulin aspart  0-20 Units Subcutaneous TID WC  . insulin aspart  0-5 Units Subcutaneous QHS  . ipratropium-albuterol  3 mL Nebulization Q4H  . methylPREDNISolone (SOLU-MEDROL) injection  60 mg Intravenous Q12H   Infusions:  . diltiazem (CARDIZEM) infusion 5 mg/hr (03/22/16 0043)    Labs:  Recent Labs  03/22/16 0601 03/23/16 0430  NA 138 138  K 4.6 4.9  CL 99* 99*  CO2 28 33*  GLUCOSE 190* 140*  BUN 39* 54*  CREATININE 1.38* 1.44*  CALCIUM 8.7* 8.7*  MG 1.8  --     Recent Labs  03/20/16 1221  AST 18  ALT 9*  ALKPHOS 69  BILITOT 1.2  PROT 7.3  ALBUMIN 3.1*    Recent Labs  03/20/16 1221  03/22/16 0601 03/23/16 0430  WBC 8.1  < > 10.4 10.7  NEUTROABS 6.4  --   --   --   HGB 9.2*  < > 8.3* 8.8*  HCT 30.0*  < > 27.5* 29.2*  MCV 74.0*  < > 73.9* 74.5*  PLT 338  < > 270 381  < > = values in this interval not displayed.  Recent Labs  03/20/16 1221  TROPONINI <0.03   Invalid input(s): POCBNP No results for input(s): HGBA1C  in the last 72 hours.   Weights: Filed Weights   03/20/16 1201 03/20/16 1731  Weight: (!) 163.3 kg (360 lb) (!) 160.2 kg (353 lb 3.2 oz)     Radiology/Studies:  Dg Chest 1 View  Result Date: 03/21/2016 CLINICAL DATA:  CHF EXAM: CHEST 1 VIEW COMPARISON:  03/20/2016 FINDINGS: Cardiomegaly with vascular congestion. Bibasilar airspace opacities are noted, likely atelectasis. Mild interstitial prominence. Findings may reflect edema/ CHF. No real change. IMPRESSION: Continued cardiomegaly.  Question mild interstitial edema. Bibasilar atelectasis. Electronically Signed   By: Rolm Baptise M.D.   On: 03/21/2016 07:17   Dg Chest 1 View  Result Date: 03/20/2016 CLINICAL  DATA:  Shortness of breath. EXAM: CHEST 1 VIEW COMPARISON:  None. FINDINGS: Mild cardiomegaly is noted. No pneumothorax or pleural effusion is noted. Bony thorax is unremarkable. Mild bibasilar subsegmental atelectasis is noted. IMPRESSION: Mild cardiomegaly.  Mild bibasilar subsegmental atelectasis. Electronically Signed   By: Marijo Conception, M.D.   On: 03/20/2016 14:09   US Venous Img Lower Bilateral  Result Date: 03/20/2016 CLINICAL DATA:  Lower extremity edema. EXAM: BILATERAL LOWER EXTREMITY VENOUS DOPPLER ULTRASOUND TECHNIQUE: Gray-scale sonography with graded compression, as well as color Doppler and duplex ultrasound were performed to evaluate the lower extremity deep venous systems from the level of the common femoral vein and including the common femoral, femoral, profunda femoral, popliteal and calf veins including the posterior tibial, peroneal and gastrocnemius veins when visible. The superficial great saphenous vein was also interrogated. Spectral Doppler was utilized to evaluate flow at rest and with distal augmentation maneuvers in the common femoral, femoral and popliteal veins. COMPARISON:  None. FINDINGS: RIGHT LOWER EXTREMITY Technically challenging examination due to body habitus. Normal compressibility, augmentation and color Doppler flow in the right common femoral vein, right femoral vein and right popliteal vein. The right saphenofemoral junction is patent. Right profunda femoral vein is patent without thrombus. Right deep calf veins could not confidently evaluated. LEFT LOWER EXTREMITY Normal compressibility, augmentation and color Doppler flow in the left common femoral vein, left femoral vein and left popliteal vein. The left saphenofemoral junction is patent. Left profunda femoral vein is patent without thrombus. Limited evaluation of the left calf veins. IMPRESSION: Technically challenging examination due to body habitus. No evidence for deep venous thrombosis in the visualized  bilateral lower extremities. Bilateral calf veins could not be evaluated. Electronically Signed   By: Markus Daft M.D.   On: 03/20/2016 16:55   Dg Chest Port 1 View  Result Date: 03/22/2016 CLINICAL DATA:  Acute onset of shortness of breath and cough. Initial encounter. EXAM: PORTABLE CHEST 1 VIEW COMPARISON:  Chest radiograph performed 03/21/2016 FINDINGS: The lungs are well-aerated. A small left pleural effusion is noted. Vascular congestion is noted, with increased interstitial markings, concerning for pulmonary edema. Pneumonia could have a similar appearance. No pneumothorax is seen. The cardiomediastinal silhouette is mildly enlarged. No acute osseous abnormalities are identified. IMPRESSION: Small left pleural effusion. Vascular congestion and mild cardiomegaly, with increased interstitial markings, concerning for pulmonary edema. Pneumonia could have a similar appearance. Electronically Signed   By: Garald Balding M.D.   On: 03/22/2016 01:08     Assessment and Recommendation  65 y.o. female with essential hypertension mixed hyperlipidemia diabetes with complication chronic kidney disease stage III sleep apnea with acute on chronic shortness of breath and COPD anemia of unknown etiology now with atrial fibrillation nonvalvular in nature with preventricular contractions with controlled rate of atrial fibrillation 1. Continue further investigation of  causes of significant shortness of breath including acute on chronic renal failure sleep apnea and obesity 2. Continue investigation of causes of anemia which may exacerbate symptoms listed above 3. No change in atenolol dose for heart rate control of atrial fibrillation 4. Aggressive therapy of sleep apnea and COPD 5. No change in medication management for hypertension 6. Begin ambulation and follow for any further significant symptoms and adjustments of medications 7. Anticoagulation with the appropriate medication management for risk reduction in  stroke with atrial fibrillation if appropriate and not contraindicated due to significant anemia 8. No further cardiac diagnostics necessary at this time 9. Further rehabilitation of above as outpatient with follow-up from cardiology for adjustments of medication management 10. Call if further questions  Signed, Serafina Royals M.D. FACC

## 2016-03-23 NOTE — Progress Notes (Signed)
Pt.'s severity score of 4 suggests the nebulizer treatments be changed to Q6. Pt. Has no wheezes, does not take inhalers at home and CXR is clear.

## 2016-03-23 NOTE — Progress Notes (Signed)
Dr. Darvin Neighbours updated on CXR and ABG results.  Dr. Darvin Neighbours verbalized that patient will be moved to ICU stepdown status for continuous BiPAP.

## 2016-03-23 NOTE — Progress Notes (Signed)
Patient with respiratory rate 22-24 breath per minutes, wet non-productive cough, and coarse bilateral breath sounds. Patient remains alert and oriented, but says things at times that would make you question her mentation such as " I need to get a mango slider whenever you have time".  MD called and notified of information and verbal orders obtained.

## 2016-03-23 NOTE — Progress Notes (Signed)
Telephone report called to [employer], RN.

## 2016-03-23 NOTE — Progress Notes (Signed)
Physical Therapy Treatment Patient Details Name: Brittney Shepard MRN: [id number] DOB: 28-May-1950 Today's Date: 03/23/2016    History of Present Illness Brittney Shepard  is a 65 y.o. female with a known history of Morbid obesity, diabetes and essential hypertension who presents with above complaint. Patient reports over the past several a she has had increasing shortness of breath and wheezing. On her medical chart initially assessed COPD but patient and her husband are adamant that she has never been diagnosed with COPD and she has never smoked. This is now been removed from her medical chart. She was seen on Friday at [place] [facility] due to shortness of breath and she reports that they sent her home without any medical therapy. She comes into the ER via EMS due to increasing shortness of breath and wheezing. She is also found to have new onset atrial fibrillation. She has received IV steroids and DuoNeb nebs and reports that she does feel better. She is also found to be hypoxic on room air with O2 saturations in the mid 80s. She denies history of sleep apnea, however husband reports that she needs to be tested for sleep apnea. Her heart rates are in the 120s and she has been given IV diltiazem.    PT Comments    Pt remains limited in functional mobility by fatigue/weakness with pt refusing bed mobility/sitting at EOB this date.  Education provided regarding physiological benefits of activity and being upright in sitting VS remaining in supine.  Supine therex provided per below with frequent therapeutic rest breaks and vital sign monitoring with HR 97-114 bpm during session and SpO2 >/= 97% on 3LO2/min throughout.  Pt will benefit from PT services to address deficits in strength, gait, mobility, transfers, and activity tolerance for decreased caregiver assistance upon discharge.   Follow Up Recommendations  SNF     Equipment Recommendations  None recommended by PT    Recommendations for Other Services        Precautions / Restrictions Precautions Precautions: Fall Restrictions Weight Bearing Restrictions: No    Mobility  Bed Mobility               General bed mobility comments: Pt requested not to sit up at EOB this session secondary to fatigue.  Education provided regarding physiological benefits of activity and importance of getting out of the bed with pt stating she would make attempt next session.   Transfers                 General transfer comment: Unable/unsafe to attempt. Pt unwilling at this time due to fatigue  Ambulation/Gait             General Gait Details: Unable/unsafe to attempt. Pt unwilling at this time due to fatigue   Stairs            Wheelchair Mobility    Modified Rankin (Stroke Patients Only)       Balance                                    Cognition Arousal/Alertness: Awake/alert Behavior During Therapy: WFL for tasks assessed/performed Overall Cognitive Status: Within Functional Limits for tasks assessed                      Exercises Total Joint Exercises Ankle Circles/Pumps: AROM;Strengthening;10 reps;Both;Other reps (comment) (2 x 10 reps) Quad Sets: AROM;Both;10 reps;15 reps;Other reps (comment) (  2 x 10 reps) Gluteal Sets: AROM;Both;10 reps;Other reps (comment) (2 x 10 reps) Short Arc Quad: Strengthening;Both;Other reps (comment);10 reps (2 x 10 reps) Heel Slides: AAROM;Both;10 reps;Other reps (comment) (2 x 10 reps) Hip ABduction/ADduction: AAROM;Both;10 reps;Other reps (comment) (2 x 10 reps) Straight Leg Raises: AAROM;Both;10 reps;Other reps (comment) (2 x 10 reps)    General Comments        Pertinent Vitals/Pain Pain Assessment: No/denies pain    Home Living                      Prior Function            PT Goals (current goals can now be found in the care plan section) Progress towards PT goals: Not progressing toward goals - comment (Pt limited by fatigue)     Frequency    Min 2X/week      PT Plan Current plan remains appropriate    Co-evaluation             End of Session Equipment Utilized During Treatment: Oxygen Activity Tolerance: Patient limited by fatigue Patient left: in bed;with bed alarm set;with call bell/phone within reach;with family/visitor present     Time: [id number] PT Time Calculation (min) (ACUTE ONLY): 28 min  Charges:  $Therapeutic Exercise: 23-37 mins                    G Codes:      DRoyetta Asal PT, DPT 03/23/16, 1:29 PM

## 2016-03-23 NOTE — Care Management (Signed)
patient has verbalized concern that her married name Of Heaslip is not on her hospital records. In Epic this chart is in the process of a mergeJakeira, Brittney Shepard - [id number].  Patient is being transferred to ICU stepdown due to need for continuous bipap.  Her PC02 is 84. Admitting dx of "new hypercarbic respiratory failure.  there does not appear to be any documentation of OSA.  Pulmonary consult is pending.

## 2016-03-24 ENCOUNTER — Inpatient Hospital Stay: Payer: Medicare HMO

## 2016-03-24 DIAGNOSIS — J810 Acute pulmonary edema: Secondary | ICD-10-CM

## 2016-03-24 DIAGNOSIS — E662 Morbid (severe) obesity with alveolar hypoventilation: Secondary | ICD-10-CM

## 2016-03-24 DIAGNOSIS — J441 Chronic obstructive pulmonary disease with (acute) exacerbation: Secondary | ICD-10-CM

## 2016-03-24 DIAGNOSIS — I481 Persistent atrial fibrillation: Secondary | ICD-10-CM

## 2016-03-24 DIAGNOSIS — J9622 Acute and chronic respiratory failure with hypercapnia: Secondary | ICD-10-CM

## 2016-03-24 DIAGNOSIS — J9621 Acute and chronic respiratory failure with hypoxia: Secondary | ICD-10-CM

## 2016-03-24 LAB — PROCALCITONIN: Procalcitonin: <0.1 ng/mL

## 2016-03-24 LAB — GLUCOSE, CAPILLARY
Glucose-Capillary: 131 mg/dL — ABNORMAL HIGH (ref 65–99)
Glucose-Capillary: 123 mg/dL — ABNORMAL HIGH (ref 65–99)
Glucose-Capillary: 82 mg/dL (ref 65–99)
Glucose-Capillary: 118 mg/dL — ABNORMAL HIGH (ref 65–99)

## 2016-03-24 LAB — BASIC METABOLIC PANEL
Anion gap: 4 — ABNORMAL LOW (ref 5–15)
BUN: 66 mg/dL — ABNORMAL HIGH (ref 6–20)
CO2: 35 mmol/L — ABNORMAL HIGH (ref 22–32)
Calcium: 8.6 mg/dL — ABNORMAL LOW (ref 8.9–10.3)
Chloride: 99 mmol/L — ABNORMAL LOW (ref 101–111)
Creatinine, Ser: 1.52 mg/dL — ABNORMAL HIGH (ref 0.44–1.00)
GFR calc Af Amer: 40 mL/min — ABNORMAL LOW (ref >60)
GFR calc non Af Amer: 34 mL/min — ABNORMAL LOW (ref >60)
Glucose, Bld: 130 mg/dL — ABNORMAL HIGH (ref 65–99)
Potassium: 4.8 mmol/L (ref 3.5–5.1)
Sodium: 138 mmol/L (ref 135–145)

## 2016-03-24 LAB — ECHOCARDIOGRAM COMPLETE
Height: 67 in
Weight: 5651.2 oz

## 2016-03-24 LAB — HEMOGLOBIN: Hemoglobin: 8.9 g/dL — ABNORMAL LOW (ref 12.0–16.0)

## 2016-03-24 LAB — BRAIN NATRIURETIC PEPTIDE: B Natriuretic Peptide: 398 pg/mL — ABNORMAL HIGH (ref 0.0–100.0)

## 2016-03-24 MED ORDER — ORAL CARE MOUTH RINSE
15.0000 mL | Freq: Two times a day (BID) | OROMUCOSAL | Status: DC
  Administered 2016-03-24 – 2016-03-27 (×5): 15 mL via OROMUCOSAL

## 2016-03-24 MED ORDER — METHYLPREDNISOLONE SODIUM SUCC 40 MG IJ SOLR
40.0000 mg | INTRAMUSCULAR | Status: DC
  Administered 2016-03-25: 40 mg via INTRAVENOUS
  Filled 2016-03-24: qty 1

## 2016-03-24 MED ORDER — PERMETHRIN 5 % EX CREA
TOPICAL_CREAM | Freq: Once | CUTANEOUS | Status: AC
  Administered 2016-03-24: 16:00:00 via TOPICAL
  Filled 2016-03-24: qty 60

## 2016-03-24 MED ORDER — CHLORHEXIDINE GLUCONATE 0.12 % MT SOLN
15.0000 mL | Freq: Two times a day (BID) | OROMUCOSAL | Status: DC
  Administered 2016-03-24 – 2016-03-27 (×7): 15 mL via OROMUCOSAL
  Filled 2016-03-24 (×4): qty 15

## 2016-03-24 NOTE — Progress Notes (Addendum)
Patient ID: Brittney Shepard, female   DOB: 1950-10-15, 65 y.o.   MRN: [id number]   [place] [facility] [place] NAME: Brittney Shepard    MR#:  [id number]  DATE OF BIRTH:  [birth year]  SUBJECTIVE:  CHIEF COMPLAINT:   Chief Complaint  Patient presents with  . Altered Mental Status  . Urinary Incontinence  This is a 65 year old white female with CKD stage III, sleep apnea, morbid obesity, hypertension, hyperlipidemia, diabetes who is admitted with hypercarbic respiratory failure and new-onset of atrial fibrillation.  Transfer to ICU yesterday evening due to worsening respiratory status and placed on BiPAP.  REVIEW OF SYSTEMS:  Review of Systems  Unable to perform ROS: Mental status change    DRUG ALLERGIES:  No Known Allergies VITALS:  Blood pressure 96/64, pulse 81, temperature 97.6 F (36.4 C), temperature source Axillary, resp. rate 14, height 5\' 7"  (1.702 m), weight (!) 160.2 kg (353 lb 3.2 oz), SpO2 100 %. PHYSICAL EXAMINATION:  Physical Exam  Physical Exam  Constitutional: obese HENT:  Head: Normocephalic.  Eyes: No scleral icterus.  Neck: Normal range of motion. Neck supple. No JVD present. No tracheal deviation present.  Cardiovascular: Normal rate, regular rhythm and normal heart sounds.  Exam reveals no gallop and no friction rub.   No murmur heard. Tachycardia with irregular irregular heartbeat  Pulmonary/Chest: Effort normal. No respiratory distress. She has wheezes. She has no rales. She exhibits no tenderness.  Abdominal: Soft. Bowel sounds are normal. She exhibits no distension. There is no tenderness. There is no rebound and no guarding.  Obese  Musculoskeletal: Normal range of motion. She exhibits edema.   LABORATORY PANEL:   CBC  Recent Labs Lab 03/23/16 0430 03/24/16 0801  WBC 10.7  --   HGB 8.8* 8.9*  HCT 29.2*  --   PLT 381  --     ------------------------------------------------------------------------------------------------------------------ Chemistries   Recent Labs Lab 03/20/16 1221  03/22/16 0601  03/24/16 0801  NA 138  < > 138  < > 138  K 3.4*  < > 4.6  < > 4.8  CL 97*  < > 99*  < > 99*  CO2 34*  < > 28  < > 35*  GLUCOSE 164*  < > 190*  < > 130*  BUN 24*  < > 39*  < > 66*  CREATININE 1.09*  < > 1.38*  < > 1.52*  CALCIUM 8.7*  < > 8.7*  < > 8.6*  MG  --   --  1.8  --   --   AST 18  --   --   --   --   ALT 9*  --   --   --   --   ALKPHOS 69  --   --   --   --   BILITOT 1.2  --   --   --   --   < > = values in this interval not displayed. RADIOLOGY:  Dg Chest 1 View  Result Date: 03/23/2016 CLINICAL DATA:  Tachypnea EXAM: CHEST 1 VIEW COMPARISON:  03/22/2016 FINDINGS: Cardiomegaly again noted. Limited study by patient's large body habitus. Central mild vascular congestion without convincing pulmonary edema. Bilateral basilar atelectasis. IMPRESSION: Limited study by patient's large body habitus. Central mild vascular congestion without convincing pulmonary edema. Bilateral basilar atelectasis. Electronically Signed   By: Lahoma Crocker M.D.   On: 03/23/2016 13:50   ASSESSMENT AND PLAN:   65 year old female with morbid obesity,  diabetes and essential hypertension who was admitted yesterday with hypercarbic respiratory failure and new onset of atrial fibrillation.  1. Acute hypoxic/hypercarbic respiratory failure Secondary to acute bronchitis and CHF Patient did not wear BiPAP after midnight. Confused. Replace BiPAP. Critically ill. Patient is DO NOT RESUSCITATE. Discussed with Dr. Arline Asp of pulmonary who will see the patient.  * Acute CHF On IV lasix Cardiology following  2. New onset atrial fibrillation in the setting of problem #1:  Rate control with atenolol CHADS VASC score >3. Continue aspirin. No anticoagulation due to anemia Cardiology consultation and recommendations  appreciated. Echocardiogram -results pending  3. CKD stage III stable  3. Microcytic Anemia,iron deficiency.  started oral iron.  4. Essential hypertension Hold lisinopril  5. Diabetes: Patient will be on sliding scale insulin and outpatient medications Uncontrolled due to steroids  6. Hypokalemia,mild: Replete and recheck in a.m.  7. Lower extremity edema is improving. Continue IV Lasix Echo- pending  8. Acute metabolic encephalopathy due to hypercarbia  CODE STATUS: DNR  TOTAL CC TIME TAKING CARE OF THIS PATIENT: 37minutes.    Hillary Bow R MD. on 03/24/2016 at 10:04 AM  Between 7am to 6pm - Pager - [phone]  After 6pm go to [email or web address] - [employer] [facility]  Office  [phone]  CC: Primary care physician; No PCP Per Patient  Note: This dictation was prepared with Dragon dictation along with smaller phrase technology. Any transcriptional errors that result from this process are unintentional.

## 2016-03-24 NOTE — Progress Notes (Signed)
Chaplain rounded the unit to prove a compassionate presence and support to the family of the patient who has been scheduled to palliative care. No visitors were at the bedside. Chaplain provided silent prayer. Brittney Shepard [phone]

## 2016-03-24 NOTE — Progress Notes (Signed)
MD Sudini rounded in room, notified MD that patient has no IV access. Will not be able to administer IV medications until access obtained. MD stated he will order PICC line to be placed. Will continue to monitor patient.

## 2016-03-24 NOTE — Consult Note (Signed)
Consultation Note Date: 03/24/2016   Patient Name: Brittney Shepard  DOB: [birth year]  MRN: [id number]  Age / Sex: 65 y.o., female  PCP: No Pcp Per Patient Referring Physician: Hillary Bow, MD  Reason for Consultation: Establishing goals of care and Psychosocial/spiritual support  HPI/Patient Profile:   65 y.o. female admitted on 03/20/2016 with  female with a known history of Morbid obesity, diabetes and essential hypertension. Patient reported worsening SOB and wheezing.   She went to the  ER via EMS due to increasing shortness of breath and wheezing. She was found to have new onset atrial fibrillation.  She currently is on Bi Pap; acute on chronic respiratory failure, acute pulmonary edema, AKI, encephalopathy.  Family is faced with advanced directive decisions and anticipatory care needs.  Clinical Assessment and Goals of Care:   This NP Wadie Lessen reviewed medical records, received report from team, assessed the patient and then meet at the patient's bedside along with her husband, son and several other family members  to discuss diagnosis, prognosis, GOC, and options.  We talked and I attempted to educate family in an understanding of her medical situation and how her various co-morbid ites are influencing her current situation.  They are having a hard time processing her current situation, poor long term prognosis,  telling me that "she as fine before this".    A  discussion was had today regarding advanced directives.    The difference between a aggressive medical intervention path  and a palliative comfort care path for this patient at this time was had.  Values and goals of care important to patient and family were attempted to be elicited.  Concept of  Palliative Care was discussed   Questions and concerns addressed.   Family encouraged to call with questions or concerns.  PMT will continue to  support holistically.    SUMMARY OF RECOMMENDATIONS    Code Status/Advance Care Planning:  DNR-previously documented   Symptom Management:   Acute respiratory failure: medical management, family is ok with continued BiPap.  Palliative Prophylaxis:   Aspiration, Bowel Regimen, Delirium Protocol, Frequent Pain Assessment and Oral Care  Additional Recommendations (Limitations, Scope, Preferences):  "one day a time", family is hopeful for improvement and return to baseline.  They understand the seriousness of the disease and "will not let her suffer"  Psycho-social/Spiritual:   Desire for further Chaplaincy support:yes   Prognosis:   Unable to determine, depends on desire for life prolonging intervetnions  Discharge Planning: To Be Determined      Primary Diagnoses: Present on Admission: . Atrial fibrillation ([place])   I have reviewed the medical record, interviewed the patient and family, and examined the patient. The following aspects are pertinent.  Past Medical History:  Diagnosis Date  . Diabetes mellitus without complication ([place])   . Hypertension   . Stroke [facility]) 2013   Social History   Social History  . Marital status: Married    Spouse name: N/A  . Number of children: N/A  . Years of  education: N/A   Social History Main Topics  . Smoking status: Never Smoker  . Smokeless tobacco: Never Used  . Alcohol use No  . Drug use: No  . Sexual activity: Not Asked   Other Topics Concern  . None   Social History Narrative  . None   History reviewed. No pertinent family history. Scheduled Meds: . aspirin EC  325 mg Oral Daily  . atenolol  100 mg Oral Daily  . azithromycin  500 mg Intravenous Q24H  . chlorhexidine  15 mL Mouth Rinse BID  . enoxaparin (LOVENOX) injection  40 mg Subcutaneous Q12H  . ferrous sulfate  325 mg Oral BID WC  . furosemide  40 mg Intravenous BID  . insulin aspart  0-20 Units Subcutaneous TID WC  . insulin aspart  0-5 Units  Subcutaneous QHS  . ipratropium-albuterol  3 mL Nebulization Q6H  . mouth rinse  15 mL Mouth Rinse q12n4p  . methylPREDNISolone (SOLU-MEDROL) injection  60 mg Intravenous Q12H   Continuous Infusions: . diltiazem (CARDIZEM) infusion 5 mg/hr (03/22/16 0043)   PRN Meds:.acetaminophen **OR** acetaminophen, bisacodyl, ondansetron **OR** ondansetron (ZOFRAN) IV, oxyCODONE, senna-docusate Medications Prior to Admission:  Prior to Admission medications   Medication Sig Start Date End Date Taking? Authorizing Provider  aspirin EC 81 MG tablet Take 81 mg by mouth daily.   Yes Historical Provider, MD  atenolol (TENORMIN) 100 MG tablet Take 100 mg by mouth daily.   Yes Historical Provider, MD  lisinopril-hydrochlorothiazide (PRINZIDE,ZESTORETIC) 20-25 MG tablet Take 1 tablet by mouth daily.   Yes Historical Provider, MD  metFORMIN (GLUCOPHAGE) 500 MG tablet Take 500 mg by mouth daily with breakfast.   Yes Historical Provider, MD   No Known Allergies Review of Systems  Unable to perform ROS: Acuity of condition    Physical Exam  Constitutional: She appears lethargic. She appears ill.  -obese  Cardiovascular: Tachycardia present.   Pulmonary/Chest: She has decreased breath sounds in the right middle field, the right lower field, the left middle field and the left lower field.  Currently ion BiPap  Abdominal: Soft. Bowel sounds are decreased.  Neurological: She appears lethargic.  Skin: Skin is warm and dry.    Vital Signs: BP (!) 100/58   Pulse 76   Temp 97.6 F (36.4 C) (Axillary)   Resp 16   Ht 5\' 7"  (1.702 m)   Wt (!) 160.2 kg (353 lb 3.2 oz)   SpO2 100%   BMI 55.32 kg/m  Pain Assessment: CPOT       SpO2: SpO2: 100 % O2 Device:SpO2: 100 % O2 Flow Rate: .O2 Flow Rate (L/min): 3 L/min  IO: Intake/output summary:  Intake/Output Summary (Last 24 hours) at 03/24/16 1225 Last data filed at 03/24/16 0715  Gross per 24 hour  Intake              250 ml  Output             1025 ml   Net             -775 ml    LBM: Last BM Date: 03/23/16 Baseline Weight: Weight: (!) 163.3 kg (360 lb) Most recent weight: Weight: (!) 160.2 kg (353 lb 3.2 oz)      Palliative Assessment/Data: 30 %   Discussed with Dr Darvin Neighbours  Time In: 1215 Time Out: 1330 Time Total: 75 min Greater than 50%  of this time was spent counseling and coordinating care related to the above assessment and plan.  Signed by: Wadie Lessen, NP   Please contact Palliative Medicine Team phone at [phone] for questions and concerns.  For individual provider: See Shea Evans

## 2016-03-24 NOTE — Consult Note (Signed)
[facility] Consultation     ASSESSMENT/PLAN    PULMONARY A:Acute on chronic hypercapnic and hypoxic respiratory failure.  --Acute pulmonary edema.  --Hypoventilation, likely associated with COPD and/or obesity hypoventilation.  --Bibasilar atelectasis, likely due to body habitus and cardiomegaly.  --?COPD exacerbation; Will decrease steroids to PO.   P:   --Continue diuresis.  --Continue bipap, attempted to increase pressure but was not tolerated.   CARDIOVASCULAR A: Afib with RVR and acute pulmonary edema.  P:  Cardizem.   RENAL A:  AKI P:   Continue to monitor kidney function.  GASTROINTESTINAL A:  --  HEMATOLOGIC A:  --  INFECTIOUS A: -- P:    Micro/culture results:  BCx2 -- UC -- Sputum--  Antibiotics: Azithromycin 11/19>>   ENDOCRINE A:    P:   Sliding scale insuling.   NEUROLOGIC A:  Metabolic encephalopathy.    MAJOR EVENTS/TEST RESULTS:   Best Practices  DVT Prophylaxis: enoxaparin GI Prophylaxis: --  Advance Care Planning:  Discussed case with pt and pt's caregiver. Current patient clinical status explained to family in detail. They related to me patient's wishes that patient is to remain DNR.   A further downward course is predictable and likely, Pt's husband agree that patient's code status will remain DNR; we will continue current care and monitor her status to see if she improves.   Time spent in discussion 30 min.    ---------------------------------------  ---------------------------------------   Name: [name] MRN: [id number] DOB: 1950/12/29    ADMISSION DATE:  03/20/2016 CONSULTATION DATE:  03/24/16  REFERRING MD :  Dr. Darvin Neighbours  CHIEF COMPLAINT:  Dyspnea.    HISTORY OF PRESENT ILLNESS:    The patient is currently obtunded and on bipap, therefore history was obtained from chart, ICU staff, hospitalist physician, and pt's husband and son who were at the bedside.   They tell me that  she has been short winded for some time now, she has CKD, OSA, HTn, DM. She has been having cough and dyspnea over the past few days. In the ER she was noted to have Afib with RVR.  The patient was transferred to the ICU over night due to respiratory distress on bipap. ABG showed 7.25/84  PAST MEDICAL HISTORY :  Past Medical History:  Diagnosis Date  . Diabetes mellitus without complication ([place])   . Hypertension   . Stroke [facility]) 2013   History reviewed. No pertinent surgical history. Prior to Admission medications   Medication Sig Start Date End Date Taking? Authorizing Provider  aspirin EC 81 MG tablet Take 81 mg by mouth daily.   Yes Historical Provider, MD  atenolol (TENORMIN) 100 MG tablet Take 100 mg by mouth daily.   Yes Historical Provider, MD  lisinopril-hydrochlorothiazide (PRINZIDE,ZESTORETIC) 20-25 MG tablet Take 1 tablet by mouth daily.   Yes Historical Provider, MD  metFORMIN (GLUCOPHAGE) 500 MG tablet Take 500 mg by mouth daily with breakfast.   Yes Historical Provider, MD   No Known Allergies  FAMILY HISTORY:  History reviewed. No pertinent family history. SOCIAL HISTORY:  reports that she has never smoked. She has never used smokeless tobacco. She reports that she does not drink alcohol or use drugs.  REVIEW OF SYSTEMS:   Could not be obtained.   VITAL SIGNS: Temp:  [97.5 F (36.4 C)-98.1 F (36.7 C)] 97.6 F (36.4 C) (11/21 0715) Pulse Rate:  [72-110] 76 (11/21 1000) Resp:  [12-21] 16 (11/21 1000) BP: (93-123)/(53-105) 100/58 (11/21 1000) SpO2:  [95 %-  100 %] 100 % (11/21 1000) FiO2 (%):  [35 %] 35 % (11/21 0821) HEMODYNAMICS:   VENTILATOR SETTINGS: FiO2 (%):  [35 %] 35 % INTAKE / OUTPUT:  Intake/Output Summary (Last 24 hours) at 03/24/16 1202 Last data filed at 03/24/16 0715  Gross per 24 hour  Intake              250 ml  Output             1025 ml  Net             -775 ml    Physical Examination:   VS: BP (!) 100/58   Pulse 76   Temp 97.6 F  (36.4 C) (Axillary)   Resp 16   Ht 5\' 7"  (1.702 m)   Wt (!) 353 lb 3.2 oz (160.2 kg)   SpO2 100%   BMI 55.32 kg/m   General Appearance: No distress  Neuro:without focal findings, mental status reduced.  HEENT: PERRLA, EOM intact, no ptosis,   Pulmonary: decreased breath sounds., diaphragmatic excursion normal. CardiovascularNormal S1,S2.  No m/r/g.    Abdomen: Benign, Soft, non-tender, No masses, hepatosplenomegaly, No lymphadenopathy Renal:  No costovertebral tenderness  GU:  Not performed at this time. Endoc: No evident thyromegaly, no signs of acromegaly. Skin:   warm, no rashes, no ecchymosis  Extremities: normal, no cyanosis, clubbing, no edema, warm with normal capillary refill.    LABS: Reviewed   LABORATORY PANEL:   CBC  Recent Labs Lab 03/23/16 0430 03/24/16 0801  WBC 10.7  --   HGB 8.8* 8.9*  HCT 29.2*  --   PLT 381  --     Chemistries   Recent Labs Lab 03/20/16 1221  03/22/16 0601  03/24/16 0801  NA 138  < > 138  < > 138  K 3.4*  < > 4.6  < > 4.8  CL 97*  < > 99*  < > 99*  CO2 34*  < > 28  < > 35*  GLUCOSE 164*  < > 190*  < > 130*  BUN 24*  < > 39*  < > 66*  CREATININE 1.09*  < > 1.38*  < > 1.52*  CALCIUM 8.7*  < > 8.7*  < > 8.6*  MG  --   --  1.8  --   --   AST 18  --   --   --   --   ALT 9*  --   --   --   --   ALKPHOS 69  --   --   --   --   BILITOT 1.2  --   --   --   --   < > = values in this interval not displayed.   Recent Labs Lab 03/23/16 0751 03/23/16 1139 03/23/16 1654 03/23/16 2048 03/24/16 0743 03/24/16 1141  GLUCAP 136* 163* 128* 141* 131* 123*    Recent Labs Lab 03/21/16 0500 03/22/16 0045 03/23/16 1321  PHART 7.40 7.38 7.25*  PCO2ART 56* 59* 84*  PO2ART 67* 95 85    Recent Labs Lab 03/20/16 1221  AST 18  ALT 9*  ALKPHOS 69  BILITOT 1.2  ALBUMIN 3.1*    Cardiac Enzymes  Recent Labs Lab 03/20/16 1221  TROPONINI <0.03    RADIOLOGY:  Dg Chest 1 View  Result Date: 03/23/2016 CLINICAL DATA:   Tachypnea EXAM: CHEST 1 VIEW COMPARISON:  03/22/2016 FINDINGS: Cardiomegaly again noted. Limited study by patient's large body habitus. Central mild  vascular congestion without convincing pulmonary edema. Bilateral basilar atelectasis. IMPRESSION: Limited study by patient's large body habitus. Central mild vascular congestion without convincing pulmonary edema. Bilateral basilar atelectasis. Electronically Signed   By: Lahoma Crocker M.D.   On: 03/23/2016 13:50       --Marda Stalker, MD.  Board Certified in Internal Medicine, Pulmonary Medicine, [place], and Sleep Medicine.  ICU Pager [phone] [facility] Office Number: [phone]  Patricia Pesa, M.D.  Vilinda Boehringer, M.D.  Merton Border, M.D   03/24/2016, 12:02 PM  Critical Care Attestation.  I have personally obtained a history, examined the patient, evaluated laboratory and imaging results, formulated the assessment and plan and placed orders. The Patient requires high complexity decision making for assessment and support, frequent evaluation and titration of therapies, application of advanced monitoring technologies and extensive interpretation of multiple databases. The patient has critical illness that could lead imminently to failure of 1 or more organ systems and requires the highest level of physician preparedness to intervene.  Critical Care Time devoted to patient care services described in this note is 45 minutes and is exclusive of time spent in procedures.

## 2016-03-24 NOTE — Progress Notes (Signed)
MD Ram rounded in room. Notified MD that patient has not worn bi-pap since midnight last night and she also had no IV access. Also notified MD that patient was more confused than when she arrived to the unit yesterday. MD stated to try and get patient to wear bi-pap at this time. Will continue to monitor patient.

## 2016-03-24 NOTE — Progress Notes (Signed)
Paged by nurse as patient was confused and in more respiratory distress.  Patient had increased work of breathing with bilateral coarse breath sounds and crackles. Was given IV Lasix. ABG checked showed respiratory acidosis with elevated PCO2 of 85 and pH 7.25. X-ray showing pulmonary edema.  Transferred to ICU for BiPAP support. Consult pulmonary.  Patient is DO NOT RESUSCITATE.  Critical care time spent 35 minutes.

## 2016-03-24 NOTE — Progress Notes (Signed)
PT Cancellation Note  Patient Details Name: Brittney Shepard MRN: [id number] DOB: April 20, 1951   Cancelled Treatment:    Reason Eval/Treat Not Completed: Other (comment) Pt transferred to ICU secondary to requiring higher level of care.  Per PT protocol will discontinue PT orders and will reassess as appropriate pending receipt of new PT orders.   Linus Salmons PT, DPT 03/24/16, 8:41 AM

## 2016-03-24 NOTE — Progress Notes (Signed)
[id number] Alert. Answers question but doesn't always make sense with her reply. Disoriented on day,date and time. Words are incomprehensible at times. MAE with great difficulty due to size. Fed self dinner with fork and by hand.

## 2016-03-25 ENCOUNTER — Inpatient Hospital Stay: Payer: Medicare HMO

## 2016-03-25 DIAGNOSIS — Z66 Do not resuscitate: Secondary | ICD-10-CM

## 2016-03-25 DIAGNOSIS — J9601 Acute respiratory failure with hypoxia: Secondary | ICD-10-CM

## 2016-03-25 DIAGNOSIS — Z515 Encounter for palliative care: Secondary | ICD-10-CM

## 2016-03-25 DIAGNOSIS — J9602 Acute respiratory failure with hypercapnia: Secondary | ICD-10-CM

## 2016-03-25 DIAGNOSIS — I4891 Unspecified atrial fibrillation: Secondary | ICD-10-CM

## 2016-03-25 LAB — CBC WITH DIFFERENTIAL/PLATELET
Basophils Absolute: 0 10*3/uL (ref 0–0.1)
Basophils Relative: 0 %
Eosinophils Absolute: 0 10*3/uL (ref 0–0.7)
Eosinophils Relative: 1 %
HCT: 28.3 % — ABNORMAL LOW (ref 35.0–47.0)
Hemoglobin: 8.6 g/dL — ABNORMAL LOW (ref 12.0–16.0)
Lymphocytes Relative: 11 %
Lymphs Abs: 0.7 10*3/uL — ABNORMAL LOW (ref 1.0–3.6)
MCH: 22.6 pg — ABNORMAL LOW (ref 26.0–34.0)
MCHC: 30.3 g/dL — ABNORMAL LOW (ref 32.0–36.0)
MCV: 74.5 fL — ABNORMAL LOW (ref 80.0–100.0)
Monocytes Absolute: 0.8 10*3/uL (ref 0.2–0.9)
Monocytes Relative: 13 %
Neutro Abs: 4.8 10*3/uL (ref 1.4–6.5)
Neutrophils Relative %: 75 %
Platelets: 281 10*3/uL (ref 150–440)
RBC: 3.8 MIL/uL (ref 3.80–5.20)
RDW: 16.7 % — ABNORMAL HIGH (ref 11.5–14.5)
WBC: 6.4 10*3/uL (ref 3.6–11.0)

## 2016-03-25 LAB — GLUCOSE, CAPILLARY
Glucose-Capillary: 97 mg/dL (ref 65–99)
Glucose-Capillary: 165 mg/dL — ABNORMAL HIGH (ref 65–99)
Glucose-Capillary: 149 mg/dL — ABNORMAL HIGH (ref 65–99)
Glucose-Capillary: 185 mg/dL — ABNORMAL HIGH (ref 65–99)

## 2016-03-25 LAB — BLOOD GAS, ARTERIAL
Acid-Base Excess: 15.1 mmol/L — ABNORMAL HIGH (ref 0.0–2.0)
Bicarbonate: 43.1 mmol/L — ABNORMAL HIGH (ref 20.0–28.0)
FIO2: 0.28
O2 Saturation: 96.9 %
Patient temperature: 37
pCO2 arterial: 78 mmHg (ref 32.0–48.0)
pH, Arterial: 7.35 (ref 7.350–7.450)
pO2, Arterial: 94 mmHg (ref 83.0–108.0)

## 2016-03-25 LAB — MRSA PCR SCREENING: MRSA by PCR: NEGATIVE

## 2016-03-25 LAB — BASIC METABOLIC PANEL
Anion gap: 2 — ABNORMAL LOW (ref 5–15)
BUN: 63 mg/dL — ABNORMAL HIGH (ref 6–20)
CO2: 40 mmol/L — ABNORMAL HIGH (ref 22–32)
Calcium: 8.4 mg/dL — ABNORMAL LOW (ref 8.9–10.3)
Chloride: 100 mmol/L — ABNORMAL LOW (ref 101–111)
Creatinine, Ser: 1.21 mg/dL — ABNORMAL HIGH (ref 0.44–1.00)
GFR calc Af Amer: 52 mL/min — ABNORMAL LOW (ref >60)
GFR calc non Af Amer: 45 mL/min — ABNORMAL LOW (ref >60)
Glucose, Bld: 100 mg/dL — ABNORMAL HIGH (ref 65–99)
Potassium: 4.1 mmol/L (ref 3.5–5.1)
Sodium: 142 mmol/L (ref 135–145)

## 2016-03-25 LAB — PROCALCITONIN: Procalcitonin: <0.1 ng/mL

## 2016-03-25 MED ORDER — PREDNISONE 20 MG PO TABS
40.0000 mg | ORAL_TABLET | Freq: Every day | ORAL | Status: DC
Start: 2016-03-25 — End: 2016-03-26
  Administered 2016-03-25 – 2016-03-26 (×2): 40 mg via ORAL
  Filled 2016-03-25 (×2): qty 2

## 2016-03-25 MED ORDER — AZITHROMYCIN 500 MG PO TABS
500.0000 mg | ORAL_TABLET | Freq: Every day | ORAL | Status: DC
  Administered 2016-03-25 – 2016-03-26 (×2): 500 mg via ORAL
  Filled 2016-03-25 (×2): qty 1

## 2016-03-25 MED ORDER — ATENOLOL 25 MG PO TABS
50.0000 mg | ORAL_TABLET | Freq: Every day | ORAL | Status: DC
Start: 2016-03-25 — End: 2016-03-27
  Administered 2016-03-25 – 2016-03-27 (×3): 50 mg via ORAL
  Filled 2016-03-25: qty 2
  Filled 2016-03-25: qty 1

## 2016-03-25 MED ORDER — DILTIAZEM HCL ER 60 MG PO CP12
60.0000 mg | ORAL_CAPSULE | Freq: Two times a day (BID) | ORAL | Status: DC
  Administered 2016-03-25 – 2016-03-27 (×5): 60 mg via ORAL
  Filled 2016-03-25 (×8): qty 1

## 2016-03-25 NOTE — Plan of Care (Signed)
Problem: Education: Goal: Knowledge of [facility] Education information/materials will improve Outcome: Progressing BP 120/74   Pulse (!) 101   Temp 97.7 F (36.5 C) (Axillary)   Resp (!) 9   Ht 5\' 7"  (1.702 m)   Wt (!) 160.2 kg (353 lb 3.2 oz)   SpO2 100%   BMI 55.32 kg/m   POC reviewed with patient, cont on q2 turns, vital signs closely monitored and any concerns addressed at this time. Will continue to monitor.     Mental Orientation: A&O x name and day, disoriented to time and situation Telemetry: Pt placed on monitor. Central tele and Elink aware of pt's transfer Assessment: Completed Skin: wnl  IV: flushes easily, no pain, blood return T. Lumen PICC Pain: no pain  Environmental changes completed to facilitate rest and relaxation.  Safety Measures: Bed alarm obn, 2/4 bed rails up.  Unit Orientation: Pt and family oriented to room, has received patient guide, and taught how to use call bell system.  Family: Family  at bedside with pt.   Pt recently treated for lice yesterday  Cont on contact isolation from that   Pending mrsa swab of the nares

## 2016-03-25 NOTE — Progress Notes (Signed)
Notified Nurse Practitioner on the floor of the four beats of PVC's observed on monitor. Patient stable and blood pressure within normal limits. Will continue to monitor and assess the patient.

## 2016-03-25 NOTE — Progress Notes (Signed)
PHARMACIST - PHYSICIAN COMMUNICATION DR:   Darvin Neighbours CONCERNING: Antibiotic IV to Oral Route Change Policy  RECOMMENDATION: This patient is receiving azithromycin 500 mg by the intravenous route.  Based on criteria approved by the Pharmacy and Therapeutics Committee, the antibiotic(s) is/are being converted to the equivalent oral dose form(s).   DESCRIPTION: These criteria include:  Patient being treated for a respiratory tract infection, urinary tract infection, cellulitis or clostridium difficile associated diarrhea if on metronidazole  The patient is not neutropenic and does not exhibit a GI malabsorption state  The patient is eating (either orally or via tube) and/or has been taking other orally administered medications for a least 24 hours  The patient is improving clinically and has a Tmax < 100.5  If you have questions about this conversion, please contact the [place], PharmD Pharmacy Resident 03/25/2016 11:18 AM

## 2016-03-25 NOTE — Progress Notes (Signed)
Patient ID: Brittney Shepard, female   DOB: [birth year], 65 y.o.   MRN: [id number]   [facility] [place] NAME: Brittney Shepard    MR#:  [id number]  DATE OF BIRTH:  26-Apr-1951  SUBJECTIVE:  CHIEF COMPLAINT:   Chief Complaint  Patient presents with  . Altered Mental Status  . Urinary Incontinence  This is a 65 year old white female with CKD stage III, sleep apnea, morbid obesity, hypertension, hyperlipidemia, diabetes who is admitted with hypercarbic respiratory failure and new-onset of atrial fibrillation.  Transfer to ICU  due to worsening respiratory status and placed on BiPAP.  Patient more awake but still confused. Doesn't BiPAP overnight. Check an ABG and PCO2 still elevated.  REVIEW OF SYSTEMS:  Review of Systems  Unable to perform ROS: Mental status change    DRUG ALLERGIES:  No Known Allergies VITALS:  Blood pressure 109/64, pulse (!) 103, temperature 98 F (36.7 C), temperature source Axillary, resp. rate 18, height 5\' 7"  (D34-534 m), weight (!) 160.2 kg (353 lb 3.2 oz), SpO2 98 %. PHYSICAL EXAMINATION:  Physical Exam   Physical Exam  Constitutional: obese HENT:  Head: Normocephalic.  Eyes: No scleral icterus.  Neck: Normal range of motion. Neck supple. No JVD present. No tracheal deviation present.  Cardiovascular: Normal rate, regular rhythm and normal heart sounds.  Exam reveals no gallop and no friction rub.   No murmur heard. Tachycardia with irregular irregular heartbeat  Pulmonary/Chest: Effort normal. No respiratory distress. She has wheezes. She has no rales. She exhibits no tenderness.  Abdominal: Soft. Bowel sounds are normal. She exhibits no distension. There is no tenderness. There is no rebound and no guarding.  Obese  Musculoskeletal: Normal range of motion. She exhibits edema.  LABORATORY PANEL:   CBC  Recent Labs Lab 03/25/16 0550  WBC 6.4  HGB 8.6*  HCT 28.3*  PLT 281    ------------------------------------------------------------------------------------------------------------------ Chemistries   Recent Labs Lab 03/20/16 1221  03/22/16 0601  03/25/16 0550  NA 138  < > 138  < > 142  K 3.4*  < > 4.6  < > 4.1  CL 97*  < > 99*  < > 100*  CO2 34*  < > 28  < > 40*  GLUCOSE 164*  < > 190*  < > 100*  BUN 24*  < > 39*  < > 63*  CREATININE 1.09*  < > 1.38*  < > 1.21*  CALCIUM 8.7*  < > 8.7*  < > 8.4*  MG  --   --  1.8  --   --   AST 18  --   --   --   --   ALT 9*  --   --   --   --   ALKPHOS 69  --   --   --   --   BILITOT 1.2  --   --   --   --   < > = values in this interval not displayed. RADIOLOGY:  Dg Chest 1 View  Result Date: 03/25/2016 CLINICAL DATA:  Dyspnea EXAM: CHEST 1 VIEW COMPARISON:  03/24/2016 FINDINGS: Cardiac shadow is enlarged. Left-sided PICC line is again seen in the mid superior vena cava. The lungs are well-aerated without focal infiltrate or sizable effusion. Mild vascular congestion remains. No bony abnormality is seen. IMPRESSION: No significant interval change from the prior exam. Electronically Signed   By: Inez Catalina M.D.   On: 03/25/2016 08:01   Dg  Chest Port 1 View  Result Date: 03/24/2016 CLINICAL DATA:  PICC line placement EXAM: PORTABLE CHEST 1 VIEW COMPARISON:  03/23/2016 FINDINGS: Left-sided PICC line with the tip projecting over the SVC. There is bilateral interstitial thickening. There is no pleural effusion or pneumothorax. There is stable cardiomegaly. The osseous structures are unremarkable. IMPRESSION: 1. Left-sided PICC line with the tip projecting over the SVC. 2. Cardiomegaly with mild pulmonary vascular congestion. Electronically Signed   By: Kathreen Devoid   On: 03/24/2016 15:05   ASSESSMENT AND PLAN:   65 year old female with morbid obesity, diabetes and essential hypertension who was admitted yesterday with hypercarbic respiratory failure and new onset of atrial fibrillation.  1. Acute  hypoxic/hypercarbic respiratory failure Secondary to acute bronchitis and CHF  Confused. Restart BiPAP. Critically ill. Patient is DO NOT RESUSCITATE.   * Acute CHF On IV lasix Cardiology following  2. New onset atrial fibrillation in the setting of problem #1:  Rate control with atenolol CHADS VASC score >3. Continue aspirin. No anticoagulation due to anemia Cardiology consultation and recommendations appreciated. decrease dose of atenolol due to low normal blood pressure  3. CKD stage III stable  3. Microcytic Anemia,iron deficiency.  started oral iron.  4. Essential hypertension Hold lisinopril  5. Diabetes: Patient will be on sliding scale insulin and outpatient medications Uncontrolled due to steroids  6. Hypokalemia,mild: Replete and recheck in a.m.  7. Lower extremity edema is improving. Continue IV Lasix Echo- EF 123456 Diastolic Dysfunction  8. Acute metabolic encephalopathy due to hypercarbia Improving  CODE STATUS: DNR  TOTAL CC TIME TAKING CARE OF THIS PATIENT: 90minutes.    Hillary Bow R MD. on 03/25/2016 at 9:19 AM  Between 7am to 6pm - Pager - [phone]  After 6pm go to [email or web address] - Technical brewer [facility]  Office  [phone]  CC: Primary care physician; No PCP Per Patient  Note: This dictation was prepared with Dragon dictation along with smaller phrase technology. Any transcriptional errors that result from this process are unintentional.

## 2016-03-25 NOTE — Progress Notes (Addendum)
[facility] Consultation     ASSESSMENT/PLAN   65 year old female with morbid obesity, sleep apnea, obesity hypoventilation syndrome, now admitted with acute pulmonary edema.  PULMONARY A:Acute on chronic hypercapnic and hypoxic respiratory failure.  --Acute pulmonary edema.  --Hypoventilation, likely associated with COPD and/or obesity hypoventilation.  --Bibasilar atelectasis, likely due to body habitus and cardiomegaly.  --?COPD exacerbation; Will decrease steroids to PO.    P:   --Continue diuresis.  --Intolerant of higher pressures.  --On and off bipap today, on continuous bipap overnight.   CARDIOVASCULAR A: Afib with RVR and acute pulmonary edema-- improved.  P:  Cardizem to PO  RENAL A:  AKI P:   Continue to monitor kidney function.  GASTROINTESTINAL A:  --  HEMATOLOGIC A:  --  INFECTIOUS A: -- P:    Micro/culture results:  BCx2 -- UC -- Sputum--  Antibiotics: Azithromycin 11/19>>   ENDOCRINE A:    P:   On scale insulin; glucose controlled.   NEUROLOGIC A:  Metabolic encephalopathy.    MAJOR EVENTS/TEST RESULTS:   Best Practices  DVT Prophylaxis: enoxaparin GI Prophylaxis: --  Advance Care Planning:  Discussed case with pt and pt's caregiver. Current patient clinical status explained to family in detail. They related to me patient's wishes that patient is to remain DNR.   A further downward course is predictable and likely, Pt's husband agree that patient's code status will remain DNR; we will continue current care and monitor her status to see if she improves.   Time spent in discussion 30 min.    ---------------------------------------  ---------------------------------------   Name: Brittney Shepard MRN: [id number] DOB: 1950-06-25    ADMISSION DATE:  03/20/2016 CONSULTATION DATE:  03/24/16  REFERRING MD :  Dr. Darvin Neighbours  CHIEF COMPLAINT:  Dyspnea.    HISTORY OF PRESENT ILLNESS:    The patient is  currently obtunded and on bipap, therefore history was obtained from chart, ICU staff, hospitalist physician, and pt's husband and son who were at the bedside.   They tell me that she has been short winded for some time now, she has CKD, OSA, HTn, DM. She has been having cough and dyspnea over the past few days. In the ER she was noted to have Afib with RVR.  The patient was transferred to the ICU over night due to respiratory distress on bipap. ABG showed 7.25/84  PAST MEDICAL HISTORY :  Past Medical History:  Diagnosis Date  . Diabetes mellitus without complication ([place])   . Hypertension   . Stroke [facility]) 2013   History reviewed. No pertinent surgical history. Prior to Admission medications   Medication Sig Start Date End Date Taking? Authorizing Provider  aspirin EC 81 MG tablet Take 81 mg by mouth daily.   Yes Historical Provider, MD  atenolol (TENORMIN) 100 MG tablet Take 100 mg by mouth daily.   Yes Historical Provider, MD  lisinopril-hydrochlorothiazide (PRINZIDE,ZESTORETIC) 20-25 MG tablet Take 1 tablet by mouth daily.   Yes Historical Provider, MD  metFORMIN (GLUCOPHAGE) 500 MG tablet Take 500 mg by mouth daily with breakfast.   Yes Historical Provider, MD   No Known Allergies  FAMILY HISTORY:  History reviewed. No pertinent family history. SOCIAL HISTORY:  reports that she has never smoked. She has never used smokeless tobacco. She reports that she does not drink alcohol or use drugs.  REVIEW OF SYSTEMS:   Could not be obtained.   VITAL SIGNS: Temp:  [97.7 F (36.5 C)-98.2 F (36.8 C)] 98 F (  36.7 C) (11/22 0200) Pulse Rate:  [76-109] 103 (11/22 0600) Resp:  [12-26] 18 (11/22 0600) BP: (93-119)/(53-93) 109/64 (11/22 0600) SpO2:  [98 %-100 %] 98 % (11/22 0730) FiO2 (%):  [35 %] 35 % (11/22 0316) HEMODYNAMICS:   VENTILATOR SETTINGS: FiO2 (%):  [35 %] 35 % INTAKE / OUTPUT:  Intake/Output Summary (Last 24 hours) at 03/25/16 0807 Last data filed at 03/25/16 0600   Gross per 24 hour  Intake              610 ml  Output             3095 ml  Net            -2485 ml    Physical Examination:   VS: BP 109/64   Pulse (!) 103   Temp 98 F (36.7 C) (Axillary)   Resp 18   Ht 5\' 7"  (1.702 m)   Wt (!) 353 lb 3.2 oz (160.2 kg)   SpO2 98%   BMI 55.32 kg/m   General Appearance: No distress  Neuro:without focal findings, mental status reduced.  HEENT: PERRLA, EOM intact, no ptosis,   Pulmonary: decreased breath sounds., diaphragmatic excursion normal. CardiovascularNormal S1,S2.  No m/r/g.    Abdomen: Benign, Soft, non-tender, No masses, hepatosplenomegaly, No lymphadenopathy Renal:  No costovertebral tenderness  GU:  Not performed at this time. Endoc: No evident thyromegaly, no signs of acromegaly. Skin:   warm, no rashes, no ecchymosis  Extremities: normal, no cyanosis, clubbing, no edema, warm with normal capillary refill.    LABS: Reviewed   LABORATORY PANEL:   CBC  Recent Labs Lab 03/25/16 0550  WBC 6.4  HGB 8.6*  HCT 28.3*  PLT 281    Chemistries   Recent Labs Lab 03/20/16 1221  03/22/16 0601  03/25/16 0550  NA 138  < > 138  < > 142  K 3.4*  < > 4.6  < > 4.1  CL 97*  < > 99*  < > 100*  CO2 34*  < > 28  < > 40*  GLUCOSE 164*  < > 190*  < > 100*  BUN 24*  < > 39*  < > 63*  CREATININE 1.09*  < > 1.38*  < > 1.21*  CALCIUM 8.7*  < > 8.7*  < > 8.4*  MG  --   --  1.8  --   --   AST 18  --   --   --   --   ALT 9*  --   --   --   --   ALKPHOS 69  --   --   --   --   BILITOT 1.2  --   --   --   --   < > = values in this interval not displayed.   Recent Labs Lab 03/23/16 2048 03/24/16 0743 03/24/16 1141 03/24/16 1706 03/24/16 1942 03/25/16 0723  GLUCAP 141* 131* 123* 82 118* 97    Recent Labs Lab 03/21/16 0500 03/22/16 0045 03/23/16 1321  PHART 7.40 7.38 7.25*  PCO2ART 56* 59* 84*  PO2ART 67* 95 85    Recent Labs Lab 03/20/16 1221  AST 18  ALT 9*  ALKPHOS 69  BILITOT 1.2  ALBUMIN 3.1*    Cardiac  Enzymes  Recent Labs Lab 03/20/16 1221  TROPONINI <0.03    RADIOLOGY:  Dg Chest 1 View  Result Date: 03/25/2016 CLINICAL DATA:  Dyspnea EXAM: CHEST 1 VIEW COMPARISON:  03/24/2016 FINDINGS:  Cardiac shadow is enlarged. Left-sided PICC line is again seen in the mid superior vena cava. The lungs are well-aerated without focal infiltrate or sizable effusion. Mild vascular congestion remains. No bony abnormality is seen. IMPRESSION: No significant interval change from the prior exam. Electronically Signed   By: Inez Catalina M.D.   On: 03/25/2016 08:01   Dg Chest 1 View  Result Date: 03/23/2016 CLINICAL DATA:  Tachypnea EXAM: CHEST 1 VIEW COMPARISON:  03/22/2016 FINDINGS: Cardiomegaly again noted. Limited study by patient's large body habitus. Central mild vascular congestion without convincing pulmonary edema. Bilateral basilar atelectasis. IMPRESSION: Limited study by patient's large body habitus. Central mild vascular congestion without convincing pulmonary edema. Bilateral basilar atelectasis. Electronically Signed   By: Lahoma Crocker M.D.   On: 03/23/2016 13:50   Dg Chest Port 1 View  Result Date: 03/24/2016 CLINICAL DATA:  PICC line placement EXAM: PORTABLE CHEST 1 VIEW COMPARISON:  03/23/2016 FINDINGS: Left-sided PICC line with the tip projecting over the SVC. There is bilateral interstitial thickening. There is no pleural effusion or pneumothorax. There is stable cardiomegaly. The osseous structures are unremarkable. IMPRESSION: 1. Left-sided PICC line with the tip projecting over the SVC. 2. Cardiomegaly with mild pulmonary vascular congestion. Electronically Signed   By: Kathreen Devoid   On: 03/24/2016 15:05       --Marda Stalker, MD.  Board Certified in Internal Medicine, Pulmonary Medicine, [place], and Sleep Medicine.  ICU Pager [phone] [facility] Office Number: [phone]  Patricia Pesa, M.D.  Vilinda Boehringer, M.D.  Merton Border,  M.D   03/25/2016, 8:07 AM  [place].  I have personally obtained a history, examined the patient, evaluated laboratory and imaging results, formulated the assessment and plan and placed orders. The Patient requires high complexity decision making for assessment and support, frequent evaluation and titration of therapies, application of advanced monitoring technologies and extensive interpretation of multiple databases. The patient has critical illness that could lead imminently to failure of 1 or more organ systems and requires the highest level of physician preparedness to intervene.  Critical Care Time devoted to patient care services described in this note is 45 minutes and is exclusive of time spent in procedures.

## 2016-03-25 NOTE — Progress Notes (Addendum)
Daily Progress Note   Patient Name: Brittney Shepard       Date: 03/25/2016 DOB: [birth year]  Age: 65 y.o. MRN#: [id number] Attending Physician: Hillary Bow, MD Primary Care Physician: No PCP Per Patient Admit Date: 03/20/2016  Reason for Consultation/Follow-up: Establishing goals of care and Psychosocial/spiritual support  Subjective:  -continued conversation with husband, son and mother in law regarding diagnosis, prognosis, GOC, treatment modalities.    Questions and concerns addressed.  -discussed in detail medical co-morbid ites and treatment modalities   -assisted son to medical records to correct name on medical records.  Family has been concerned that pateitnt's legal last name is Kissoon, and her records do not reflect this name  Length of Stay: 5  Current Medications: Scheduled Meds:  . aspirin EC  325 mg Oral Daily  . atenolol  50 mg Oral Daily  . azithromycin  500 mg Oral Daily  . chlorhexidine  15 mL Mouth Rinse BID  . diltiazem  60 mg Oral Q12H  . enoxaparin (LOVENOX) injection  40 mg Subcutaneous Q12H  . ferrous sulfate  325 mg Oral BID WC  . furosemide  40 mg Intravenous BID  . insulin aspart  0-20 Units Subcutaneous TID WC  . insulin aspart  0-5 Units Subcutaneous QHS  . ipratropium-albuterol  3 mL Nebulization Q6H  . mouth rinse  15 mL Mouth Rinse q12n4p  . predniSONE  40 mg Oral Q breakfast    Continuous Infusions:   PRN Meds: acetaminophen **OR** acetaminophen, bisacodyl, ondansetron **OR** ondansetron (ZOFRAN) IV, oxyCODONE, senna-docusate  Physical Exam  Constitutional: She appears lethargic.  Appears general;y uncomfortable Obese   HENT:  Mouth/Throat: Mucous membranes are dry.  Cardiovascular: Tachycardia present.   Pulmonary/Chest: She has decreased  breath sounds in the right lower field and the left lower field.  On BiPap  Abdominal: Soft. Bowel sounds are decreased.  Neurological: She appears lethargic.  Skin: Skin is warm and dry.            Vital Signs: BP 120/74   Pulse (!) 101   Temp 97.7 F (36.5 C) (Axillary)   Resp (!) 9   Ht 5\' 7"  (1.702 m)   Wt (!) 160.2 kg (353 lb 3.2 oz)   SpO2 100%   BMI 55.32 kg/m  SpO2: SpO2: 100 % O2 Device: O2  Device: Bi-PAP O2 Flow Rate: O2 Flow Rate (L/min): 3 L/min  Intake/output summary:  Intake/Output Summary (Last 24 hours) at 03/25/16 1132 Last data filed at 03/25/16 1000  Gross per 24 hour  Intake          1522.19 ml  Output             3445 ml  Net         -1922.81 ml   LBM: Last BM Date: 03/23/16 Baseline Weight: Weight: (!) 163.3 kg (360 lb) Most recent weight: Weight: (!) 160.2 kg (353 lb 3.2 oz)       Palliative Assessment/Data:    Flowsheet Rows   Flowsheet Row Most Recent Value  Intake Tab  Referral Department  Critical care  Unit at Time of Referral  ICU  Palliative Care Primary Diagnosis  Pulmonary  Date Notified  03/24/16  Palliative Care Type  New Palliative care  Reason for referral  Clarify Goals of Care  Date of Admission  03/20/16  Date first seen by Palliative Care  03/24/16  # of days Palliative referral response time  0 Day(s)  # of days IP prior to Palliative referral  4  Clinical Assessment  Psychosocial & Spiritual Assessment  Palliative Care Outcomes      Patient Active Problem List   Diagnosis Date Noted  . DNR (do not resuscitate)   . Palliative care by specialist   . Atrial fibrillation with rapid ventricular response ([place]) 03/20/2016  . Chronic obstructive pulmonary disease with acute exacerbation [facility])     Palliative Care Assessment & Plan   Patient Profile: 65 y.o. female admitted on 03/20/2016 with femalewith a known history of Morbid obesity, diabetes and essential hypertension. Patient reported worsening SOB and  wheezing.   She went to the  ER via EMS due to increasing shortness of breath and wheezing. She was found to have new onset atrial fibrillation.  She currently is on Bi Pap; acute on chronic respiratory failure, acute pulmonary edema, AKI, encephalopathy.  Family is faced with advanced directive decisions and anticipatory care needs.  Recommendations/Plan:   Family is open to all offered and available medical interventions to prolong life   Consider SNF for rehabilitation of patient improves and is eligible   Code Status:    Code Status Orders        Start     Ordered   03/20/16 1733  Do not attempt resuscitation (DNR)  Continuous    Question Answer Comment  In the event of cardiac or respiratory ARREST Do not call a "code blue"   In the event of cardiac or respiratory ARREST Do not perform Intubation, CPR, defibrillation or ACLS   In the event of cardiac or respiratory ARREST Use medication by any route, position, wound care, and other measures to relive pain and suffering. May use oxygen, suction and manual treatment of airway obstruction as needed for comfort.      03/20/16 1732    Code Status History    Date Active Date Inactive Code Status Order ID Comments User Context   This patient has a current code status but no historical code status.       Prognosis:   Unable to determine  Discharge Planning:  To Be Determined  Care plan was discussed with Dr Darvin Neighbours   Thank you for allowing the Palliative Medicine Team to assist in the care of this patient.   Time In: 1055 Time Out: 1130 Total Time 35 min Prolonged  Time Billed  no       Greater than 50%  of this time was spent counseling and coordinating care related to the above assessment and plan.  Wadie Lessen, NP  Please contact Palliative Medicine Team phone at [phone] for questions and concerns.

## 2016-03-26 ENCOUNTER — Inpatient Hospital Stay: Payer: Medicare HMO

## 2016-03-26 DIAGNOSIS — J9601 Acute respiratory failure with hypoxia: Secondary | ICD-10-CM

## 2016-03-26 DIAGNOSIS — J9602 Acute respiratory failure with hypercapnia: Secondary | ICD-10-CM

## 2016-03-26 LAB — BASIC METABOLIC PANEL
Anion gap: 2 — ABNORMAL LOW (ref 5–15)
BUN: 55 mg/dL — ABNORMAL HIGH (ref 6–20)
CO2: 44 mmol/L — ABNORMAL HIGH (ref 22–32)
Calcium: 8.2 mg/dL — ABNORMAL LOW (ref 8.9–10.3)
Chloride: 95 mmol/L — ABNORMAL LOW (ref 101–111)
Creatinine, Ser: 1.02 mg/dL — ABNORMAL HIGH (ref 0.44–1.00)
GFR calc Af Amer: >60 mL/min (ref >60)
GFR calc non Af Amer: 56 mL/min — ABNORMAL LOW (ref >60)
Glucose, Bld: 135 mg/dL — ABNORMAL HIGH (ref 65–99)
Potassium: 4.1 mmol/L (ref 3.5–5.1)
Sodium: 141 mmol/L (ref 135–145)

## 2016-03-26 LAB — BLOOD GAS, ARTERIAL
Acid-Base Excess: 21.2 mmol/L — ABNORMAL HIGH (ref 0.0–2.0)
Allens test (pass/fail): POSITIVE — AB
Bicarbonate: 48.5 mmol/L — ABNORMAL HIGH (ref 20.0–28.0)
Drawn by: 382351
FIO2: 28
O2 Saturation: 98 %
Patient temperature: 37
pCO2 arterial: 73 mmHg (ref 32.0–48.0)
pH, Arterial: 7.43 (ref 7.350–7.450)
pO2, Arterial: 101 mmHg (ref 83.0–108.0)

## 2016-03-26 LAB — GLUCOSE, CAPILLARY
Glucose-Capillary: 115 mg/dL — ABNORMAL HIGH (ref 65–99)
Glucose-Capillary: 171 mg/dL — ABNORMAL HIGH (ref 65–99)
Glucose-Capillary: 173 mg/dL — ABNORMAL HIGH (ref 65–99)
Glucose-Capillary: 174 mg/dL — ABNORMAL HIGH (ref 65–99)
Glucose-Capillary: 184 mg/dL — ABNORMAL HIGH (ref 65–99)

## 2016-03-26 LAB — CBC WITH DIFFERENTIAL/PLATELET
Basophils Absolute: 0 10*3/uL (ref 0–0.1)
Basophils Relative: 0 %
Eosinophils Absolute: 0 10*3/uL (ref 0–0.7)
Eosinophils Relative: 0 %
HCT: 27.6 % — ABNORMAL LOW (ref 35.0–47.0)
Hemoglobin: 8.4 g/dL — ABNORMAL LOW (ref 12.0–16.0)
Lymphocytes Relative: 7 %
Lymphs Abs: 0.4 10*3/uL — ABNORMAL LOW (ref 1.0–3.6)
MCH: 22.4 pg — ABNORMAL LOW (ref 26.0–34.0)
MCHC: 30.5 g/dL — ABNORMAL LOW (ref 32.0–36.0)
MCV: 73.4 fL — ABNORMAL LOW (ref 80.0–100.0)
Monocytes Absolute: 0.6 10*3/uL (ref 0.2–0.9)
Monocytes Relative: 10 %
Neutro Abs: 5.1 10*3/uL (ref 1.4–6.5)
Neutrophils Relative %: 83 %
Platelets: 245 10*3/uL (ref 150–440)
RBC: 3.76 MIL/uL — ABNORMAL LOW (ref 3.80–5.20)
RDW: 16.7 % — ABNORMAL HIGH (ref 11.5–14.5)
WBC: 6.1 10*3/uL (ref 3.6–11.0)

## 2016-03-26 LAB — PROCALCITONIN: Procalcitonin: <0.1 ng/mL

## 2016-03-26 MED ORDER — PREDNISONE 20 MG PO TABS
20.0000 mg | ORAL_TABLET | Freq: Every day | ORAL | Status: DC
  Administered 2016-03-27: 20 mg via ORAL
  Filled 2016-03-26: qty 1

## 2016-03-26 MED ORDER — SODIUM CHLORIDE 0.9% FLUSH: 10.0000 mL | INTRAVENOUS | Status: DC | PRN

## 2016-03-26 MED ORDER — IPRATROPIUM-ALBUTEROL 0.5-2.5 (3) MG/3ML IN SOLN
3.0000 mL | Freq: Three times a day (TID) | RESPIRATORY_TRACT | Status: DC
  Administered 2016-03-27 (×2): 3 mL via RESPIRATORY_TRACT
  Filled 2016-03-26 (×3): qty 3

## 2016-03-26 MED ORDER — SODIUM CHLORIDE 0.9% FLUSH
10.0000 mL | Freq: Two times a day (BID) | INTRAVENOUS | Status: DC
  Administered 2016-03-26: 30 mL

## 2016-03-26 MED ORDER — FUROSEMIDE 10 MG/ML IJ SOLN
20.0000 mg | Freq: Two times a day (BID) | INTRAMUSCULAR | Status: DC
  Administered 2016-03-26 – 2016-03-27 (×2): 20 mg via INTRAVENOUS
  Filled 2016-03-26 (×3): qty 2

## 2016-03-26 NOTE — Progress Notes (Signed)
[facility] [place] NAME: Brittney Shepard    MR#:  [id number]  DATE OF BIRTH:  May 01, 1951  SUBJECTIVE:   Patient alert this am did not want to use BIPAP last night  REVIEW OF SYSTEMS:    Review of Systems  Constitutional: Negative.  Negative for chills, fever and malaise/fatigue.  HENT: Negative.  Negative for ear discharge, ear pain, hearing loss, nosebleeds and sore throat.   Eyes: Negative.  Negative for blurred vision and pain.  Respiratory: Positive for cough and shortness of breath. Negative for hemoptysis and wheezing.   Cardiovascular: Positive for leg swelling. Negative for chest pain and palpitations.  Gastrointestinal: Negative.  Negative for abdominal pain, blood in stool, diarrhea, nausea and vomiting.  Genitourinary: Negative.  Negative for dysuria.  Musculoskeletal: Negative.  Negative for back pain.  Skin: Negative.   Neurological: Negative for dizziness, tremors, speech change, focal weakness, seizures and headaches.  Endo/Heme/Allergies: Negative.  Does not bruise/bleed easily.  Psychiatric/Behavioral: Negative.  Negative for depression, hallucinations and suicidal ideas.    Tolerating Diet: yes      DRUG ALLERGIES:  No Known Allergies  VITALS:  Blood pressure 124/80, pulse (!) 110, temperature 97.9 F (36.6 C), temperature source Oral, resp. rate (!) 27, height 5\' 7"  (1.702 m), weight (!) 160.2 kg (353 lb 3.2 oz), SpO2 100 %.  PHYSICAL EXAMINATION:   Physical Exam  Constitutional: She is oriented to person, place, and time. No distress.  Morbidly obese  HENT:  Head: Normocephalic.  Eyes: No scleral icterus.  Neck: Normal range of motion. Neck supple. No JVD present. No tracheal deviation present.  Cardiovascular: Normal rate, regular rhythm and normal heart sounds.  Exam reveals no gallop and no friction rub.   No murmur heard. Distant heart sounds  Pulmonary/Chest: Effort normal and breath sounds normal. No  respiratory distress. She has no wheezes. She has no rales. She exhibits no tenderness.  Decreased breath sounds  Abdominal: Soft. Bowel sounds are normal. She exhibits no distension. There is no tenderness. There is no rebound and no guarding.  Musculoskeletal: Normal range of motion. She exhibits edema.  Neurological: She is alert and oriented to person, place, and time.  Skin: Skin is warm. No rash noted. No erythema.  Psychiatric: Affect and judgment normal.      LABORATORY PANEL:   CBC  Recent Labs Lab 03/26/16 0315  WBC 6.1  HGB 8.4*  HCT 27.6*  PLT 245   ------------------------------------------------------------------------------------------------------------------  Chemistries   Recent Labs Lab 03/20/16 1221  03/22/16 0601  03/26/16 0315  NA 138  < > 138  < > 141  K 3.4*  < > 4.6  < > 4.1  CL 97*  < > 99*  < > 95*  CO2 34*  < > 28  < > 44*  GLUCOSE 164*  < > 190*  < > 135*  BUN 24*  < > 39*  < > 55*  CREATININE 1.09*  < > 1.38*  < > 1.02*  CALCIUM 8.7*  < > 8.7*  < > 8.2*  MG  --   --  1.8  --   --   AST 18  --   --   --   --   ALT 9*  --   --   --   --   ALKPHOS 69  --   --   --   --   BILITOT 1.2  --   --   --   --   < > =  values in this interval not displayed. ------------------------------------------------------------------------------------------------------------------  Cardiac Enzymes  Recent Labs Lab 03/20/16 1221  TROPONINI <0.03   ------------------------------------------------------------------------------------------------------------------  RADIOLOGY:  Dg Chest 1 View  Result Date: 03/26/2016 CLINICAL DATA:  Dyspnea. EXAM: CHEST 1 VIEW COMPARISON:  One-view chest x-ray 03/25/2016 FINDINGS: A left-sided PICC line is stable. Cardiac enlargement is stable. Pulmonary vascular congestion is unchanged. No focal airspace disease is evident. The visualized soft tissues and bony thorax are unremarkable. IMPRESSION: 1. Similar appearance of  cardiomegaly and moderate pulmonary vascular congestion without frank edema. Electronically Signed   By: San Morelle M.D.   On: 03/26/2016 07:48   Dg Chest 1 View  Result Date: 03/25/2016 CLINICAL DATA:  Dyspnea EXAM: CHEST 1 VIEW COMPARISON:  03/24/2016 FINDINGS: Cardiac shadow is enlarged. Left-sided PICC line is again seen in the mid superior vena cava. The lungs are well-aerated without focal infiltrate or sizable effusion. Mild vascular congestion remains. No bony abnormality is seen. IMPRESSION: No significant interval change from the prior exam. Electronically Signed   By: Inez Catalina M.D.   On: 03/25/2016 08:01   Dg Chest Port 1 View  Result Date: 03/24/2016 CLINICAL DATA:  PICC line placement EXAM: PORTABLE CHEST 1 VIEW COMPARISON:  03/23/2016 FINDINGS: Left-sided PICC line with the tip projecting over the SVC. There is bilateral interstitial thickening. There is no pleural effusion or pneumothorax. There is stable cardiomegaly. The osseous structures are unremarkable. IMPRESSION: 1. Left-sided PICC line with the tip projecting over the SVC. 2. Cardiomegaly with mild pulmonary vascular congestion. Electronically Signed   By: Kathreen Devoid   On: 03/24/2016 15:05     ASSESSMENT AND PLAN:   65 y/o morbidly obese female with diabetes and hypertension who is admitted for hypercarbic respiratory failure and new onset atrial fibrillation  1. Acute hypoxic/hypercarbic respiratory failure due to acute bronchitis and acute diastolic heart failure with preserved ejection fraction and likely obesity hypoventilation: Continue Lasix Continue BiPAP ISS  2. Atrial fibrillation: Heart rate is better controlled Continue diltiazem and atenolol CHADS VASC score greater than 3 continue aspirin no anti-ablation due to anemia   3. Diabetes: Continue sliding scale insulin and ADA diet   4. Chronic kidney disease stage III with stable creatinine  5. Microcytic anemia with iron deficiency:  Continue iron supplementation 6. Acute metabolic encephalopathy in setting of hypercarbia which is improving  Transfer to floor and obtain PT consult  Management plans discussed with the patient and she is in agreement.  CODE STATUS: DNR  TOTAL TIME TAKING CARE OF THIS PATIENT: 25 minutes.     POSSIBLE D/C 2-4 days, DEPENDING ON CLINICAL CONDITION.   Trexton Escamilla M.D on 03/26/2016 at 11:11 AM  Between 7am to 6pm - Pager - [phone] After 6pm go to [email or web address] - password [email or web address] [facility]  Office  [phone]  CC: Primary care physician; No PCP Per Patient  Note: This dictation was prepared with Dragon dictation along with smaller phrase technology. Any transcriptional errors that result from this process are unintentional.

## 2016-03-26 NOTE — Progress Notes (Signed)
Patient received from CCU alert and oriented x4,vital signs within normal limits,foley catheter insitu,isolation precaution for lice,3 liters oxygen via nasal canula.

## 2016-03-26 NOTE — Progress Notes (Signed)
Pt is in no distress. Pt doesn't want to wear CPAP

## 2016-03-26 NOTE — Progress Notes (Signed)
[facility] Consultation     ASSESSMENT/PLAN   65 year old female with morbid obesity, sleep apnea, obesity hypoventilation syndrome, now admitted with acute pulmonary edema.  PULMONARY A:Acute on chronic hypercapnic and hypoxic respiratory failure.  --Acute pulmonary edema.  --Hypoventilation, likely associated with COPD and/or obesity hypoventilation.  --Bibasilar atelectasis, likely due to body habitus and cardiomegaly.  --Doubt COPD exacerbation, continue to decrease steroids.     P:   --Continue diuresis.  --Intolerant of higher pressures, will use low bipap pressures.  --Continue bipap qhs.   CARDIOVASCULAR A: Afib with RVR and acute pulmonary edema-- improved.  P:  Cardizem PO to continue Decrease lasix to 20 mg IV bid.    RENAL A:  AKI P:   Continue to monitor kidney function.    ENDOCRINE A:    P:   On scale insulin; glucose controlled.   NEUROLOGIC A:  Metabolic encephalopathy.-- improved.     Best Practices  DVT Prophylaxis: enoxaparin GI Prophylaxis: --  Advance Care Planning note 03/26/16:  Discussed case with pt and pt's caregiver. Current patient clinical status explained to family in detail. They related to me patient's wishes that patient is to remain DNR.   A further downward course is predictable and likely, Pt's husband agree that patient's code status will remain DNR; we will continue current care and monitor her status to see if she improves.      ---------------------------------------  ---------------------------------------   Name: Brittney Shepard MRN: [id number] DOB: 06-07-1950     Subjective.  Pt much more awake and alert today. No new complaints.  ABG showed 7.25/84  REVIEW OF SYSTEMS:   Could not be obtained.   VITAL SIGNS: Temp:  [97.2 F (36.2 C)-98.6 F (37 C)] 97.9 F (36.6 C) (11/23 0800) Pulse Rate:  [39-110] 110 (11/23 0800) Resp:  [9-32] 27 (11/23 0800) BP: (111-136)/(58-89) 124/80  (11/23 0800) SpO2:  [76 %-100 %] 100 % (11/23 0800) FiO2 (%):  [28 %-35 %] 28 % (11/23 0801) HEMODYNAMICS:   VENTILATOR SETTINGS: FiO2 (%):  [28 %-35 %] 28 % INTAKE / OUTPUT:  Intake/Output Summary (Last 24 hours) at 03/26/16 0840 Last data filed at 03/26/16 0800  Gross per 24 hour  Intake              870 ml  Output             5025 ml  Net            -4155 ml    Physical Examination:   VS: BP 124/80 (BP Location: Right Arm)   Pulse (!) 110   Temp 97.9 F (36.6 C) (Oral)   Resp (!) 27   Ht 5\' 7"  (1.702 m)   Wt (!) 353 lb 3.2 oz (160.2 kg)   SpO2 100%   BMI 55.32 kg/m   General Appearance: No distress  Neuro:without focal findings, mental status reduced.  HEENT: PERRLA, EOM intact, no ptosis,   Pulmonary: decreased breath sounds., diaphragmatic excursion normal. CardiovascularNormal S1,S2.  No m/r/g.    Abdomen: Benign, Soft, non-tender, No masses, hepatosplenomegaly, No lymphadenopathy Renal:  No costovertebral tenderness  GU:  Not performed at this time. Endoc: No evident thyromegaly, no signs of acromegaly. Skin:   warm, no rashes, no ecchymosis  Extremities: normal, no cyanosis, clubbing, no edema, warm with normal capillary refill.    LABS: Reviewed   LABORATORY PANEL:   CBC  Recent Labs Lab 03/26/16 0315  WBC 6.1  HGB 8.4*  HCT  27.6*  PLT 245    Chemistries   Recent Labs Lab 03/20/16 1221  03/22/16 0601  03/26/16 0315  NA 138  < > 138  < > 141  K 3.4*  < > 4.6  < > 4.1  CL 97*  < > 99*  < > 95*  CO2 34*  < > 28  < > 44*  GLUCOSE 164*  < > 190*  < > 135*  BUN 24*  < > 39*  < > 55*  CREATININE 1.09*  < > 1.38*  < > 1.02*  CALCIUM 8.7*  < > 8.7*  < > 8.2*  MG  --   --  1.8  --   --   AST 18  --   --   --   --   ALT 9*  --   --   --   --   ALKPHOS 69  --   --   --   --   BILITOT 1.2  --   --   --   --   < > = values in this interval not displayed.   Recent Labs Lab 03/24/16 1942 03/25/16 0723 03/25/16 1159 03/25/16 1711  03/25/16 2104 03/26/16 0734  GLUCAP 118* 97 165* 149* 185* 115*    Recent Labs Lab 03/23/16 1321 03/25/16 0830 03/26/16 0410  PHART 7.25* 7.35 7.43  PCO2ART 84* 78* 73*  PO2ART 85 94 101    Recent Labs Lab 03/20/16 1221  AST 18  ALT 9*  ALKPHOS 69  BILITOT 1.2  ALBUMIN 3.1*    Cardiac Enzymes  Recent Labs Lab 03/20/16 1221  TROPONINI <0.03    RADIOLOGY:  Dg Chest 1 View  Result Date: 03/26/2016 CLINICAL DATA:  Dyspnea. EXAM: CHEST 1 VIEW COMPARISON:  One-view chest x-ray 03/25/2016 FINDINGS: A left-sided PICC line is stable. Cardiac enlargement is stable. Pulmonary vascular congestion is unchanged. No focal airspace disease is evident. The visualized soft tissues and bony thorax are unremarkable. IMPRESSION: 1. Similar appearance of cardiomegaly and moderate pulmonary vascular congestion without frank edema. Electronically Signed   By: San Morelle M.D.   On: 03/26/2016 07:48   Dg Chest 1 View  Result Date: 03/25/2016 CLINICAL DATA:  Dyspnea EXAM: CHEST 1 VIEW COMPARISON:  03/24/2016 FINDINGS: Cardiac shadow is enlarged. Left-sided PICC line is again seen in the mid superior vena cava. The lungs are well-aerated without focal infiltrate or sizable effusion. Mild vascular congestion remains. No bony abnormality is seen. IMPRESSION: No significant interval change from the prior exam. Electronically Signed   By: Inez Catalina M.D.   On: 03/25/2016 08:01   Dg Chest Port 1 View  Result Date: 03/24/2016 CLINICAL DATA:  PICC line placement EXAM: PORTABLE CHEST 1 VIEW COMPARISON:  03/23/2016 FINDINGS: Left-sided PICC line with the tip projecting over the SVC. There is bilateral interstitial thickening. There is no pleural effusion or pneumothorax. There is stable cardiomegaly. The osseous structures are unremarkable. IMPRESSION: 1. Left-sided PICC line with the tip projecting over the SVC. 2. Cardiomegaly with mild pulmonary vascular congestion. Electronically Signed    By: Kathreen Devoid   On: 03/24/2016 15:05       --Marda Stalker, MD.  Board Certified in Internal Medicine, Pulmonary Medicine, [place], and Sleep Medicine.  ICU Pager [phone] [facility] Office Number: [phone]  Patricia Pesa, M.D.  Vilinda Boehringer, M.D.  Merton Border, M.D   03/26/2016, 8:40 AM  Brittney Shepard.  I have personally  obtained a history, examined the patient, evaluated laboratory and imaging results, formulated the assessment and plan and placed orders. The Patient requires high complexity decision making for assessment and support, frequent evaluation and titration of therapies, application of advanced monitoring technologies and extensive interpretation of multiple databases. The patient has critical illness that could lead imminently to failure of 1 or more organ systems and requires the highest level of physician preparedness to intervene.  Critical Care Time devoted to patient care services described in this note is 45 minutes and is exclusive of time spent in procedures.

## 2016-03-26 NOTE — Progress Notes (Signed)
Pt transferred to room 247, report called to [place], Therapist, sports. Pt and belongings moved to room 247 without incident.

## 2016-03-26 NOTE — Progress Notes (Signed)
Pt came over from ICU with foley catheter in place, however there was never an order for one. Pt is total care, 2 assist, palliative care is consulting. MD paged for order for foley, Dr. Bridgett Larsson gave the okay to place the order. Order placed. No further concerns. Will continue to monitor. Conley Simmonds, RN, BSN

## 2016-03-26 NOTE — Progress Notes (Signed)
Patient voicing aggravation and showing signs of agitation with having the Bi-PAP mask on. Patient educated on importance of keeping on Bi-PAP. Patient still insisted on having Bi-PAP mask removed. Removed mask and placed on 3 liters nasal cannula. Will continue to assess patient. Notified respiratory therapy patient unable to tolerate Bi-PAP.

## 2016-03-27 LAB — CBC
HCT: 27.9 % — ABNORMAL LOW (ref 35.0–47.0)
Hemoglobin: 8.6 g/dL — ABNORMAL LOW (ref 12.0–16.0)
MCH: 23 pg — ABNORMAL LOW (ref 26.0–34.0)
MCHC: 31 g/dL — ABNORMAL LOW (ref 32.0–36.0)
MCV: 74.1 fL — ABNORMAL LOW (ref 80.0–100.0)
Platelets: 230 10*3/uL (ref 150–440)
RBC: 3.76 MIL/uL — ABNORMAL LOW (ref 3.80–5.20)
RDW: 17.1 % — ABNORMAL HIGH (ref 11.5–14.5)
WBC: 8.3 10*3/uL (ref 3.6–11.0)

## 2016-03-27 LAB — BASIC METABOLIC PANEL
Anion gap: 4 — ABNORMAL LOW (ref 5–15)
BUN: 51 mg/dL — ABNORMAL HIGH (ref 6–20)
CO2: 44 mmol/L — ABNORMAL HIGH (ref 22–32)
Calcium: 8.3 mg/dL — ABNORMAL LOW (ref 8.9–10.3)
Chloride: 94 mmol/L — ABNORMAL LOW (ref 101–111)
Creatinine, Ser: 0.97 mg/dL (ref 0.44–1.00)
GFR calc Af Amer: >60 mL/min (ref >60)
GFR calc non Af Amer: 59 mL/min — ABNORMAL LOW (ref >60)
Glucose, Bld: 121 mg/dL — ABNORMAL HIGH (ref 65–99)
Potassium: 3.9 mmol/L (ref 3.5–5.1)
Sodium: 142 mmol/L (ref 135–145)

## 2016-03-27 LAB — GLUCOSE, CAPILLARY
Glucose-Capillary: 112 mg/dL — ABNORMAL HIGH (ref 65–99)
Glucose-Capillary: 203 mg/dL — ABNORMAL HIGH (ref 65–99)
Glucose-Capillary: 148 mg/dL — ABNORMAL HIGH (ref 65–99)

## 2016-03-27 MED ORDER — SENNOSIDES-DOCUSATE SODIUM 8.6-50 MG PO TABS: 1.0000 | ORAL_TABLET | Freq: Two times a day (BID) | ORAL | Status: DC

## 2016-03-27 MED ORDER — POTASSIUM CHLORIDE ER 20 MEQ PO TBCR: 10.0000 meq | EXTENDED_RELEASE_TABLET | Freq: Every day | ORAL | 0 refills | Status: DC

## 2016-03-27 MED ORDER — DILTIAZEM HCL ER 60 MG PO CP12: 60.0000 mg | ORAL_CAPSULE | Freq: Two times a day (BID) | ORAL | 0 refills | Status: AC

## 2016-03-27 MED ORDER — ACETAMINOPHEN 325 MG PO TABS: 650.0000 mg | ORAL_TABLET | Freq: Four times a day (QID) | ORAL | 0 refills | Status: DC | PRN

## 2016-03-27 MED ORDER — SENNOSIDES-DOCUSATE SODIUM 8.6-50 MG PO TABS: 1.0000 | ORAL_TABLET | Freq: Two times a day (BID) | ORAL | 0 refills | Status: DC

## 2016-03-27 MED ORDER — FERROUS SULFATE 325 (65 FE) MG PO TABS: 325.0000 mg | ORAL_TABLET | Freq: Two times a day (BID) | ORAL | 0 refills | Status: DC

## 2016-03-27 MED ORDER — ATENOLOL 50 MG PO TABS
50.0000 mg | ORAL_TABLET | Freq: Every day | ORAL | 0 refills | Status: DC
Start: 2016-03-28 — End: 2019-12-08

## 2016-03-27 MED ORDER — FUROSEMIDE 40 MG PO TABS: 40.0000 mg | ORAL_TABLET | Freq: Two times a day (BID) | ORAL | 0 refills | Status: DC

## 2016-03-27 MED ORDER — IPRATROPIUM-ALBUTEROL 0.5-2.5 (3) MG/3ML IN SOLN: 3.0000 mL | Freq: Three times a day (TID) | RESPIRATORY_TRACT | 0 refills | Status: DC

## 2016-03-27 NOTE — Discharge Summary (Signed)
Brittney Shepard at [place] NAME: Brittney Shepard    MR#:  [id number]  DATE OF BIRTH:  Dec 19, 1950  DATE OF ADMISSION:  03/20/2016 ADMITTING PHYSICIAN: Bettey Costa, MD  DATE OF DISCHARGE: 04/26/2016  PRIMARY CARE PHYSICIAN: No PCP Per Patient    ADMISSION DIAGNOSIS:  Edema [R60.9] Atrial fibrillation with rapid ventricular response (HCC) [I48.91] Chronic obstructive pulmonary disease with acute exacerbation (HCC) [J44.1]  DISCHARGE DIAGNOSIS:  Active Problems:   Atrial fibrillation with rapid ventricular response ([place])   DNR (do not resuscitate)   Palliative care by specialist   Acute respiratory failure with hypoxia and hypercapnia ([place])   SECONDARY DIAGNOSIS:   Past Medical History:  Diagnosis Date  . Diabetes mellitus without complication ([place])   . Hypertension   . Stroke [facility]) 2013    HOSPITAL COURSE:  65 y/o morbidly obese female with diabetes and hypertension who is admitted for hypercarbic respiratory failure and new onset atrial fibrillation  1. Acute hypoxic/hypercarbic respiratory failure due to acute bronchitis and acute diastolic heart failure with preserved ejection fraction and elevated CO2 consistent with obesity hypoventilation: Continue Lasix which has been switched to oral. She needs outpatient sleep study and close follow up with PULMONARY.  She has completed course of antibiotics and prednisone  2. Atrial fibrillation: Her heart rate is better controlled Continue diltiazem and atenolol CHADS VASC score greater than 3 continue aspirin no anticoagulation due to anemia.   3. Diabetes: Continue Metformin and ADA diet   4. Chronic kidney disease stage III with stable creatinine  5. Microcytic anemia with iron deficiency: Continue iron supplementation 6. Acute metabolic encephalopathy in setting of hypercarbia which has improved    DISCHARGE CONDITIONS AND DIET:  Stable Diabetic diet  CONSULTS OBTAINED:   Treatment Team:  Corey Skains, MD Vilinda Boehringer, MD  DRUG ALLERGIES:  No Known Allergies  DISCHARGE MEDICATIONS:   Current Discharge Medication List    START taking these medications   Details  acetaminophen (TYLENOL) 325 MG tablet Take 2 tablets (650 mg total) by mouth every 6 (six) hours as needed for mild pain (or Fever >/= 101). Qty: 60 tablet, Refills: 0    diltiazem (CARDIZEM SR) 60 MG 12 hr capsule Take 1 capsule (60 mg total) by mouth every 12 (twelve) hours. Qty: 60 capsule, Refills: 0    ferrous sulfate 325 (65 FE) MG tablet Take 1 tablet (325 mg total) by mouth 2 (two) times daily with a meal. Qty: 60 tablet, Refills: 0    furosemide (LASIX) 40 MG tablet Take 1 tablet (40 mg total) by mouth 2 (two) times daily. Qty: 30 tablet, Refills: 0    ipratropium-albuterol (DUONEB) 0.5-2.5 (3) MG/3ML SOLN Take 3 mLs by nebulization 3 (three) times daily. Qty: 360 mL, Refills: 0    potassium chloride 20 MEQ TBCR Take 10 mEq by mouth daily. Qty: 30 tablet, Refills: 0    senna-docusate (SENOKOT-S) 8.6-50 MG tablet Take 1 tablet by mouth 2 (two) times daily. Qty: 60 tablet, Refills: 0      CONTINUE these medications which have CHANGED   Details  atenolol (TENORMIN) 50 MG tablet Take 1 tablet (50 mg total) by mouth daily. Qty: 30 tablet, Refills: 0      CONTINUE these medications which have NOT CHANGED   Details  aspirin EC 81 MG tablet Take 81 mg by mouth daily.    metFORMIN (GLUCOPHAGE) 500 MG tablet Take 500 mg by mouth daily with breakfast.  STOP taking these medications     lisinopril-hydrochlorothiazide (PRINZIDE,ZESTORETIC) 20-25 MG tablet               Today   CHIEF COMPLAINT:  No issues overnight She agrees with SNF    VITAL SIGNS:  Blood pressure 118/65, pulse (!) 101, temperature 97.8 F (36.6 C), temperature source Oral, resp. rate 20, height 5\' 7"  (1.702 m), weight (!) 160.2 kg (353 lb 3.2 oz), SpO2 (!) 89 %.   REVIEW OF  SYSTEMS:  Review of Systems  Constitutional: Negative.  Negative for chills, fever and malaise/fatigue.  HENT: Negative.  Negative for ear discharge, ear pain, hearing loss, nosebleeds and sore throat.   Eyes: Negative.  Negative for blurred vision and pain.  Respiratory: Negative.  Negative for cough, hemoptysis, shortness of breath and wheezing.   Cardiovascular: Negative.  Negative for chest pain, palpitations and leg swelling.  Gastrointestinal: Negative.  Negative for abdominal pain, blood in stool, diarrhea, nausea and vomiting.  Genitourinary: Negative.  Negative for dysuria.  Musculoskeletal: Negative.  Negative for back pain.  Skin: Negative.   Neurological: Negative for dizziness, tremors, speech change, focal weakness, seizures and headaches.  Endo/Heme/Allergies: Negative.  Does not bruise/bleed easily.  Psychiatric/Behavioral: Negative.  Negative for depression, hallucinations and suicidal ideas.     PHYSICAL EXAMINATION:  GENERAL:  65 y.o.-year-old patient lying in the bed with no acute distress. Morbidly obese NECK:  Supple, no jugular venous distention. No thyroid enlargement, no tenderness.  LUNGS: Normal breath sounds bilaterally, no wheezing, rales,rhonchi  No use of accessory muscles of respiration.  CARDIOVASCULAR: S1, S2 normal. No murmurs, rubs, or gallops.  ABDOMEN: Soft, non-tender, non-distended. Bowel sounds present. Hard to appreciate  organomegaly or mass.  EXTREMITIES: 1+ pedal edema, NO cyanosis, or clubbing.  PSYCHIATRIC: The patient is alert and oriented x 3.  SKIN: No obvious rash, lesion, or ulcer.   DATA REVIEW:   CBC  Recent Labs Lab 03/27/16 0600  WBC 8.3  HGB 8.6*  HCT 27.9*  PLT 230    Chemistries   Recent Labs Lab 03/22/16 0601  03/27/16 0600  NA 138  < > 142  K 4.6  < > 3.9  CL 99*  < > 94*  CO2 28  < > 44*  GLUCOSE 190*  < > 121*  BUN 39*  < > 51*  CREATININE 1.38*  < > 0.97  CALCIUM 8.7*  < > 8.3*  MG 1.8  --   --   <  > = values in this interval not displayed.  Cardiac Enzymes No results for input(s): TROPONINI in the last 168 hours.  Microbiology Results  @[email or web address] @  RADIOLOGY:  Dg Chest 1 View  Result Date: 03/26/2016 CLINICAL DATA:  Dyspnea. EXAM: CHEST 1 VIEW COMPARISON:  One-view chest x-ray 03/25/2016 FINDINGS: A left-sided PICC line is stable. Cardiac enlargement is stable. Pulmonary vascular congestion is unchanged. No focal airspace disease is evident. The visualized soft tissues and bony thorax are unremarkable. IMPRESSION: 1. Similar appearance of cardiomegaly and moderate pulmonary vascular congestion without frank edema. Electronically Signed   By: San Morelle M.D.   On: 03/26/2016 07:48      Management plans discussed with the patient and she is in agreement. Stable for discharge SNF  Patient should follow up with pcp  CODE STATUS:     Code Status Orders        Start     Ordered   03/20/16 1733  Do not attempt resuscitation (DNR)  Continuous    Question Answer Comment  In the event of cardiac or respiratory ARREST Do not call a "code blue"   In the event of cardiac or respiratory ARREST Do not perform Intubation, CPR, defibrillation or ACLS   In the event of cardiac or respiratory ARREST Use medication by any route, position, wound care, and other measures to relive pain and suffering. May use oxygen, suction and manual treatment of airway obstruction as needed for comfort.      03/20/16 1732    Code Status History    Date Active Date Inactive Code Status Order ID Comments User Context   This patient has a current code status but no historical code status.      TOTAL TIME TAKING CARE OF THIS PATIENT: 38 minutes.    Note: This dictation was prepared with Dragon dictation along with smaller phrase technology. Any transcriptional errors that result from this process are unintentional.  Delayla Hoffmaster M.D on 03/27/2016 at 3:53 PM  Between 7am to 6pm - Pager -  [phone] After 6pm go to [email or web address] - password [email or web address] [facility]  Office  [phone]  CC: Primary care physician; No PCP Per Patient

## 2016-03-27 NOTE — Clinical Social Work Note (Signed)
Clinical Social Work Assessment  Patient Details  Name: Brittney Shepard MRN: [id number] Date of Birth: 1950/06/26  Date of referral:  03/27/16               Reason for consult:  Facility Placement                Permission sought to share information with:  Facility Sport and exercise psychologist, Family Supports Permission granted to share information::  Yes, Verbal Permission Granted  Name::        Agency::     Relationship::     Contact Information:     Housing/Transportation Living arrangements for the past 2 months:  Single Family Home Source of Information:  Patient Patient Interpreter Needed:  None Criminal Activity/Legal Involvement Pertinent to Current Situation/Hospitalization:  No - Comment as needed Significant Relationships:  Spouse Lives with:  Spouse Do you feel safe going back to the place where you live?  Yes Need for family participation in patient care:  Yes (Comment)  Care giving concerns:  Patient lives with her husband and was evaluated by PT and they recommended STR.   Social Worker assessment / plan:  CSW met with patient this afternoon to discuss PT recommendations. CSW explained role and purpose of visit. Patient stated that she does not want to go to rehab but she states her husband is wanting her to so she says she will. Patient then emphatically states she does not want [facility] "because they killed my mother." CSW explained that her insurance would have to approve and that we would send the info into her insurance today.   Employment status:  Disabled (Comment on whether or not currently receiving Disability) Insurance information:  Managed Medicare PT Recommendations:  [place] / Referral to community resources:  [place]  Patient/Family's Response to care:  Patient expressed appreciation for CSW visit.  Patient/Family's Understanding of and Emotional Response to Diagnosis, Current Treatment, and Prognosis:   Patient prefers to return home but states that her husband wants her to go to rehab.   Emotional Assessment Appearance:  Appears stated age Attitude/Demeanor/Rapport:   (cooperative) Affect (typically observed):  Accepting, Adaptable, Calm Orientation:  Oriented to Self, Oriented to Place, Oriented to  Time, Oriented to Situation Alcohol / Substance use:  Not Applicable Psych involvement (Current and /or in the community):  No (Comment)  Discharge Needs  Concerns to be addressed:  Care Coordination Readmission within the last 30 days:  No Current discharge risk:  None Barriers to Discharge:  No Barriers Identified   Shela Leff, LCSW 03/27/2016, 12:27 PM

## 2016-03-27 NOTE — Clinical Social Work Placement (Signed)
   CLINICAL SOCIAL WORK PLACEMENT  NOTE  Date:  03/27/2016  Patient Details  Name: Brittney Shepard MRN: [id number] Date of Birth: [birth year]  Clinical Social Work is seeking post-discharge placement for this patient at the [place] level of care (*CSW will initial, date and re-position this form in  chart as items are completed):  Yes   Patient/family provided with [facility] Work Department's list of facilities offering this level of care within the geographic area requested by the patient (or if unable, by the patient's family).  Yes   Patient/family informed of their freedom to choose among providers that offer the needed level of care, that participate in Medicare, Medicaid or managed care program needed by the patient, have an available bed and are willing to accept the patient.  Yes   Patient/family informed of [facility]'s ownership interest in [facility] and [facility], as well as of the fact that they are under no obligation to receive care at these facilities.  PASRR submitted to EDS on 03/27/16     PASRR number received on 03/27/16     Existing PASRR number confirmed on       FL2 transmitted to all facilities in geographic area requested by pt/family on 03/27/16     FL2 transmitted to all facilities within larger geographic area on       Patient informed that his/her managed care company has contracts with or will negotiate with certain facilities, including the following:        Yes   Patient/family informed of bed offers received.  Patient chooses bed at  [facility])     Physician recommends and patient chooses bed at  [facility])    Patient to be transferred to  ([facility]) on 03/27/16.  Patient to be transferred to facility by  (EMS)     Patient family notified on 03/27/16 of transfer.  Name of family member notified:   (patient and husband)     PHYSICIAN       Additional Comment:     _______________________________________________ Shela Leff, LCSW 03/27/2016, 4:10 PM

## 2016-03-27 NOTE — Care Management Important Message (Signed)
Important Message  Patient Details  Name: Brittney Shepard MRN: [id number] Date of Birth: [birth year]   Medicare Important Message Given:  Yes    [name] A, RN 03/27/2016, 7:57 AM

## 2016-03-27 NOTE — Clinical Social Work Note (Signed)
Patient's husband clarified and corrected patient earlier that the facility they didn't want was [facility]. He stated that was the actual facility her mother was at. CSW extended bed offers from [facility] and [facility] and [facility]. Patient and husband chose [facility]. [id number] Physician is aware and is discharging patient this afternoon. Discharge information sent to [facility] at [facility]. Shela Leff MSW,LCSW [phone]

## 2016-03-27 NOTE — NC FL2 (Signed)
[place] LEVEL OF CARE SCREENING TOOL     IDENTIFICATION  Patient Name: Brittney Shepard Birthdate: 24-May-1950 Sex: female Admission Date (Current Location): 03/20/2016  [place] and [place] Number:  Engineering geologist and Address:  [facility], [address] [place], [place] [address]      Provider Number: [id number]  Attending Physician Name and Address:  Bettey Costa, MD  Relative Name and Phone Number:       Current Level of Care: Hospital Recommended Level of Care: [place] Prior Approval Number:    Date Approved/Denied:   PASRR Number: [id number]  Discharge Plan: SNF    Current Diagnoses: Patient Active Problem List   Diagnosis Date Noted  . DNR (do not resuscitate)   . Palliative care by specialist   . Acute respiratory failure with hypoxia and hypercapnia ([place])   . Atrial fibrillation with rapid ventricular response ([place]) 03/20/2016  . Chronic obstructive pulmonary disease with acute exacerbation (HCC)     Orientation RESPIRATION BLADDER Height & Weight     Self, Situation, Place  Normal, O2 (2 liters) Continent Weight: (!) 353 lb 3.2 oz (160.2 kg) Height:  5\' 7"  (170.2 cm)  BEHAVIORAL SYMPTOMS/MOOD NEUROLOGICAL BOWEL NUTRITION STATUS   (none)  (none) Incontinent Diet (heart healthy/carb modified)  AMBULATORY STATUS COMMUNICATION OF NEEDS Skin   Extensive Assist Verbally Normal                       Personal Care Assistance Level of Assistance  Bathing, Dressing Bathing Assistance: Maximum assistance Feeding assistance: Independent Dressing Assistance: Maximum assistance     Functional Limitations Info   (no issues)          SPECIAL CARE FACTORS FREQUENCY  PT (By licensed PT)                    Contractures Contractures Info: Not present    Additional Factors Info  Code Status, Allergies Code Status Info: dnr Allergies Info: nka           Current Medications  (03/27/2016):  This is the current hospital active medication list Current Facility-Administered Medications  Medication Dose Route Frequency Provider Last Rate Last Dose  . acetaminophen (TYLENOL) tablet 650 mg  650 mg Oral Q6H PRN Bettey Costa, MD       Or  . acetaminophen (TYLENOL) suppository 650 mg  650 mg Rectal Q6H PRN Bettey Costa, MD      . aspirin EC tablet 325 mg  325 mg Oral Daily Bettey Costa, MD   325 mg at 03/27/16 0944  . atenolol (TENORMIN) tablet 50 mg  50 mg Oral Daily Hillary Bow, MD   50 mg at 03/27/16 0944  . bisacodyl (DULCOLAX) EC tablet 5 mg  5 mg Oral Daily PRN Bettey Costa, MD      . chlorhexidine (PERIDEX) 0.12 % solution 15 mL  15 mL Mouth Rinse BID Hillary Bow, MD   15 mL at 03/27/16 0944  . diltiazem (CARDIZEM SR) 12 hr capsule 60 mg  60 mg Oral Q12H Laverle Hobby, MD   60 mg at 03/27/16 1035  . enoxaparin (LOVENOX) injection 40 mg  40 mg Subcutaneous Q12H Hillary Bow, MD   40 mg at 03/27/16 0944  . ferrous sulfate tablet 325 mg  325 mg Oral BID WC Srikar Sudini, MD   325 mg at 03/27/16 0814  . furosemide (LASIX) injection 20 mg  20 mg Intravenous BID  Laverle Hobby, MD   20 mg at 03/27/16 0814  . insulin aspart (novoLOG) injection 0-20 Units  0-20 Units Subcutaneous TID WC Bettey Costa, MD   7 Units at 03/27/16 1221  . insulin aspart (novoLOG) injection 0-5 Units  0-5 Units Subcutaneous QHS Bettey Costa, MD   2 Units at 03/20/16 2151  . ipratropium-albuterol (DUONEB) 0.5-2.5 (3) MG/3ML nebulizer solution 3 mL  3 mL Nebulization TID Hillary Bow, MD   3 mL at 03/27/16 1334  . MEDLINE mouth rinse  15 mL Mouth Rinse q12n4p Hillary Bow, MD   15 mL at 03/27/16 1135  . ondansetron (ZOFRAN) tablet 4 mg  4 mg Oral Q6H PRN Bettey Costa, MD       Or  . ondansetron (ZOFRAN) injection 4 mg  4 mg Intravenous Q6H PRN Bettey Costa, MD      . oxyCODONE (Oxy IR/ROXICODONE) immediate release tablet 5 mg  5 mg Oral Q4H PRN Bettey Costa, MD   5 mg at 03/25/16 1235  .  senna-docusate (Senokot-S) tablet 1 tablet  1 tablet Oral QHS PRN Bettey Costa, MD   1 tablet at 03/25/16 1753  . senna-docusate (Senokot-S) tablet 1 tablet  1 tablet Oral BID Sital Mody, MD      . sodium chloride flush (NS) 0.9 % injection 10-40 mL  10-40 mL Intracatheter Q12H Srikar Sudini, MD   30 mL at 03/26/16 2258  . sodium chloride flush (NS) 0.9 % injection 10-40 mL  10-40 mL Intracatheter PRN Hillary Bow, MD         Discharge Medications: Please see discharge summary for a list of discharge medications.  Relevant Imaging Results:  Relevant Lab Results:   Additional Information [id number]  Shela Leff, LCSW

## 2016-03-27 NOTE — Progress Notes (Signed)
Pt to be discharged to [facility] this evening. picc line removed per order. Tele removed. Foley taken out earlier today and pt has voided (incontinent). Report has been calle d to the facility. Pt to be transported via ems.

## 2016-03-27 NOTE — Clinical Social Work Note (Signed)
Patient's husband came to hospital and CSW explained the need for the social security card. Patient's husband call his son and his son has the card but because they do not like to give the number over the phone, he insisted he would bring it by in a little bit. CSW will follow up with the patient later this afternoon. CSW has also faxed referral information to Medicare [employer] [facility]) for review.  Shela Leff MSW,LCSW [phone]

## 2016-03-27 NOTE — Evaluation (Signed)
Physical Therapy Evaluation Patient Details Name: Brittney Shepard MRN: [id number] DOB: [birth year] Today's Date: 03/27/2016   History of Present Illness  Brittney Shepard  is a 65 y.o. female with a known history of Morbid obesity, diabetes and essential hypertension who presents with above complaint. Patient reports over the past several a she has had increasing shortness of breath and wheezing. On her medical chart initially assessed COPD but patient and her husband are adamant that she has never been diagnosed with COPD and she has never smoked. This is now been removed from her medical chart. She was seen on Friday at [facility] due to shortness of breath and she reports that they sent her home without any medical therapy. She comes into the ER via EMS due to increasing shortness of breath and wheezing. She is also found to have new onset atrial fibrillation. She has received IV steroids and DuoNeb nebs and reports that she does feel better. She is also found to be hypoxic on room air with O2 saturations in the mid 80s. She denies history of sleep apnea, however husband reports that she needs to be tested for sleep apnea. Her heart rates are in the 120s and she has been given IV diltiazem.  Hospital course complicated by acute respiratory failure (related to acute pulmonary edema, hypoventilation syndrome) requiring transfer to CCU (11/20) for BiPAP; now returned to floor (11/23) with orders to resume PT Services.  Clinical Impression  Re-evaluation completed after transfer to/from CCU during hospitalization. Patient now on RA, maintaining sats 89-91% with minimal exertion.  Patient globally weak and deconditioned, requiring act assist for movement throughout full ROM in bilat LEs (limited, in part, by soft tissue).  Unable to complete rolling without total assist from therapist; unsafe/unable to attempt sitting or OOB activities this date. Would benefit from skilled PT to address above deficits and promote  optimal return to PLOF; recommend transition to STR upon discharge from acute hospitalization.     Follow Up Recommendations SNF    Equipment Recommendations       Recommendations for Other Services       Precautions / Restrictions Precautions Precautions: Fall Restrictions Weight Bearing Restrictions: No      Mobility  Bed Mobility Overal bed mobility: Needs Assistance Bed Mobility: Rolling Rolling: Total assist         General bed mobility comments: unable to initiate/complete rotation of lower body without total assist from therapist; very heavy use of bedrails for upper body rotation  Transfers                 General transfer comment: Unable/unsafe to attempt  Ambulation/Gait             General Gait Details: Unable/unsafe to attempt  Stairs            Wheelchair Mobility    Modified Rankin (Stroke Patients Only)       Balance                                             Pertinent Vitals/Pain Pain Assessment: No/denies pain    Home Living Family/patient expects to be discharged to:: Private residence Living Arrangements: Spouse/significant other Available Help at Discharge: Family Type of Home: House Home Access: Ramped entrance     Home Layout: One level Home Equipment: Environmental consultant - 2 wheels;Cane - single point;Bedside commode;Wheelchair -  manual      Prior Function Level of Independence: Independent with assistive device(s)         Comments: Household ambulator with spc prn, no falls. Independent with ADLs, assist from husband with IADLs (per previous notes)     Hand Dominance   Dominant Hand: Right    Extremity/Trunk Assessment   Upper Extremity Assessment: Generalized weakness (globally at least 4-/5 throughout)           Lower Extremity Assessment: Generalized weakness (globally 3-/5 throughout; ROM generally limited by adiposity)         Communication   Communication: No  difficulties  Cognition Arousal/Alertness: Awake/alert Behavior During Therapy: WFL for tasks assessed/performed Overall Cognitive Status: Within Functional Limits for tasks assessed                      General Comments      Exercises Other Exercises Other Exercises: Supine LE therex, 1x15, act assist ROM for muscular strength/endurance: ankle pumps, SAQs, heel slides with resisted extension, hip abduct/adduct and SLR.  Difficulty mobilizing LEs throughout full range due to adiposity; greater functional strength in R LE compared to L LE   Assessment/Plan    PT Assessment Patient needs continued PT services  PT Problem List Decreased strength;Decreased activity tolerance;Decreased balance;Decreased mobility;Obesity;Decreased range of motion          PT Treatment Interventions DME instruction;Gait training;Functional mobility training;Therapeutic activities;Therapeutic exercise;Balance training;Neuromuscular re-education    PT Goals (Current goals can be found in the Care Plan section)  Acute Rehab PT Goals Patient Stated Goal: To get stronger PT Goal Formulation: With patient Time For Goal Achievement: 04/10/16 Potential to Achieve Goals: Fair    Frequency Min 2X/week   Barriers to discharge Decreased caregiver support      Co-evaluation               End of Session   Activity Tolerance: Patient limited by fatigue Patient left: in bed;with call bell/phone within reach;with bed alarm set           Time: [id number] PT Time Calculation (min) (ACUTE ONLY): 18 min   Charges:   PT Evaluation $PT Re-evaluation: 1 Procedure PT Treatments $Therapeutic Exercise: 8-22 mins   PT G Codes:       Aalyssa Elderkin H. Owens Shark, PT, DPT, NCS 03/27/16, 10:39 AM [phone]

## 2016-03-27 NOTE — Progress Notes (Signed)
[place] [facility] [place] NAME: [name]    MR#:  [id number]  DATE OF BIRTH:  07-14-1950  SUBJECTIVE:   No acute events overnight She did not want to use BiPAP  REVIEW OF SYSTEMS:    Review of Systems  Constitutional: Negative.  Negative for chills, fever and malaise/fatigue.  HENT: Negative.  Negative for ear discharge, ear pain, hearing loss, nosebleeds and sore throat.   Eyes: Negative.  Negative for blurred vision and pain.  Respiratory: Positive for shortness of breath. Negative for cough, hemoptysis and wheezing.   Cardiovascular: Positive for leg swelling. Negative for chest pain and palpitations.  Gastrointestinal: Negative.  Negative for abdominal pain, blood in stool, diarrhea, nausea and vomiting.  Genitourinary: Negative.  Negative for dysuria.  Musculoskeletal: Negative.  Negative for back pain.  Skin: Negative.   Neurological: Negative for dizziness, tremors, speech change, focal weakness, seizures and headaches.  Endo/Heme/Allergies: Negative.  Does not bruise/bleed easily.  Psychiatric/Behavioral: Negative.  Negative for depression, hallucinations and suicidal ideas.    Tolerating Diet: yes      DRUG ALLERGIES:  No Known Allergies  VITALS:  Blood pressure (!) 135/52, pulse 90, temperature 97.8 F (36.6 C), temperature source Oral, resp. rate 20, height 5\' 7"  (1.702 m), weight (!) 160.2 kg (353 lb 3.2 oz), SpO2 91 %.  PHYSICAL EXAMINATION:   Physical Exam  Constitutional: She is oriented to person, place, and time. No distress.  Morbidly obese  HENT:  Head: Normocephalic.  Eyes: No scleral icterus.  Neck: Normal range of motion. Neck supple. No JVD present. No tracheal deviation present.  Cardiovascular: Normal rate, regular rhythm and normal heart sounds.  Exam reveals no gallop and no friction rub.   No murmur heard. Distant heart sounds  Pulmonary/Chest: Effort normal and breath sounds normal. No respiratory  distress. She has no wheezes. She has no rales. She exhibits no tenderness.  Decreased breath sounds  Abdominal: Soft. Bowel sounds are normal. She exhibits no distension. There is no tenderness. There is no rebound and no guarding.  Musculoskeletal: Normal range of motion. She exhibits edema.  Neurological: She is alert and oriented to person, place, and time.  Skin: Skin is warm. No rash noted. No erythema.  Psychiatric: Affect and judgment normal.      LABORATORY PANEL:   CBC  Recent Labs Lab 03/27/16 0600  WBC 8.3  HGB 8.6*  HCT 27.9*  PLT 230   ------------------------------------------------------------------------------------------------------------------  Chemistries   Recent Labs Lab 03/20/16 1221  03/22/16 0601  03/27/16 0600  NA 138  < > 138  < > 142  K 3.4*  < > 4.6  < > 3.9  CL 97*  < > 99*  < > 94*  CO2 34*  < > 28  < > 44*  GLUCOSE 164*  < > 190*  < > 121*  BUN 24*  < > 39*  < > 51*  CREATININE 1.09*  < > 1.38*  < > 0.97  CALCIUM 8.7*  < > 8.7*  < > 8.3*  MG  --   --  1.8  --   --   AST 18  --   --   --   --   ALT 9*  --   --   --   --   ALKPHOS 69  --   --   --   --   BILITOT 1.2  --   --   --   --   < > =  values in this interval not displayed. ------------------------------------------------------------------------------------------------------------------  Cardiac Enzymes  Recent Labs Lab 03/20/16 1221  TROPONINI <0.03   ------------------------------------------------------------------------------------------------------------------  RADIOLOGY:  Dg Chest 1 View  Result Date: 03/26/2016 CLINICAL DATA:  Dyspnea. EXAM: CHEST 1 VIEW COMPARISON:  One-view chest x-ray 03/25/2016 FINDINGS: A left-sided PICC line is stable. Cardiac enlargement is stable. Pulmonary vascular congestion is unchanged. No focal airspace disease is evident. The visualized soft tissues and bony thorax are unremarkable. IMPRESSION: 1. Similar appearance of cardiomegaly  and moderate pulmonary vascular congestion without frank edema. Electronically Signed   By: San Morelle M.D.   On: 03/26/2016 07:48     ASSESSMENT AND PLAN:   65 y/o morbidly obese female with diabetes and hypertension who is admitted for hypercarbic respiratory failure and new onset atrial fibrillation  1. Acute hypoxic/hypercarbic respiratory failure due to acute bronchitis and acute diastolic heart failure with preserved ejection fraction and likely obesity hypoventilation: Continue Lasix Continue BiPAPAt night if she agrees Continue ISS Stop prednisone today 2. Atrial fibrillation: Heart rate is better controlled Continue diltiazem and atenolol CHADS VASC score greater than 3 continue aspirin no anti-ablation due to anemia   3. Diabetes: Continue sliding scale insulin and ADA diet   4. Chronic kidney disease stage III with stable creatinine  5. Microcytic anemia with iron deficiency: Continue iron supplementation 6. Acute metabolic encephalopathy in setting of hypercarbia which has improved  Physical therapy is recommending skilled nursing facility at discharge.  Management plans discussed with the patient and she is in agreement.  CODE STATUS: DNR  TOTAL TIME TAKING CARE OF THIS PATIENT: 25 minutes.     POSSIBLE D/C Monday and the insurance approval, DEPENDING ON CLINICAL CONDITION.   Albin Duckett M.D on 03/27/2016 at 11:26 AM  Between 7am to 6pm - Pager - [phone] After 6pm go to [email or web address] - password [email or web address] [place] [facility]  Office  [phone]  CC: Primary care physician; No PCP Per Patient  Note: This dictation was prepared with Dragon dictation along with smaller phrase technology. Any transcriptional errors that result from this process are unintentional.

## 2016-05-27 DIAGNOSIS — I5032 Chronic diastolic (congestive) heart failure: Secondary | ICD-10-CM | POA: Insufficient documentation

## 2016-05-27 DIAGNOSIS — M170 Bilateral primary osteoarthritis of knee: Secondary | ICD-10-CM | POA: Insufficient documentation

## 2016-05-27 DIAGNOSIS — D649 Anemia, unspecified: Secondary | ICD-10-CM | POA: Insufficient documentation

## 2017-11-17 ENCOUNTER — Other Ambulatory Visit: Payer: Self-pay | Admitting: Family Medicine

## 2017-11-17 DIAGNOSIS — Z1231 Encounter for screening mammogram for malignant neoplasm of breast: Secondary | ICD-10-CM

## 2019-11-18 ENCOUNTER — Emergency Department: Payer: Medicare Other

## 2019-11-18 ENCOUNTER — Inpatient Hospital Stay
Admission: EM | Admit: 2019-11-18 | Discharge: 2019-11-23 | DRG: 657 | Disposition: A | Discharge status: Skilled Nursing Facility | Payer: Medicare Other | Attending: Internal Medicine | Admitting: Internal Medicine

## 2019-11-18 ENCOUNTER — Other Ambulatory Visit: Payer: Self-pay

## 2019-11-18 DIAGNOSIS — R310 Gross hematuria: Secondary | ICD-10-CM

## 2019-11-18 DIAGNOSIS — I10 Essential (primary) hypertension: Secondary | ICD-10-CM

## 2019-11-18 DIAGNOSIS — N3000 Acute cystitis without hematuria: Secondary | ICD-10-CM | POA: Diagnosis not present

## 2019-11-18 DIAGNOSIS — Z8051 Family history of malignant neoplasm of kidney: Secondary | ICD-10-CM

## 2019-11-18 DIAGNOSIS — C679 Malignant neoplasm of bladder, unspecified: Secondary | ICD-10-CM

## 2019-11-18 DIAGNOSIS — N131 Hydronephrosis with ureteral stricture, not elsewhere classified: Secondary | ICD-10-CM | POA: Diagnosis present

## 2019-11-18 DIAGNOSIS — R296 Repeated falls: Secondary | ICD-10-CM | POA: Diagnosis present

## 2019-11-18 DIAGNOSIS — R935 Abnormal findings on diagnostic imaging of other abdominal regions, including retroperitoneum: Secondary | ICD-10-CM

## 2019-11-18 DIAGNOSIS — N950 Postmenopausal bleeding: Secondary | ICD-10-CM

## 2019-11-18 DIAGNOSIS — W19XXXA Unspecified fall, initial encounter: Secondary | ICD-10-CM | POA: Insufficient documentation

## 2019-11-18 DIAGNOSIS — R600 Localized edema: Secondary | ICD-10-CM | POA: Diagnosis present

## 2019-11-18 DIAGNOSIS — M25562 Pain in left knee: Secondary | ICD-10-CM | POA: Diagnosis present

## 2019-11-18 DIAGNOSIS — N3289 Other specified disorders of bladder: Secondary | ICD-10-CM | POA: Insufficient documentation

## 2019-11-18 DIAGNOSIS — N3001 Acute cystitis with hematuria: Secondary | ICD-10-CM | POA: Diagnosis present

## 2019-11-18 DIAGNOSIS — Z880 Allergy status to penicillin: Secondary | ICD-10-CM

## 2019-11-18 DIAGNOSIS — I4891 Unspecified atrial fibrillation: Secondary | ICD-10-CM | POA: Diagnosis not present

## 2019-11-18 DIAGNOSIS — C670 Malignant neoplasm of trigone of bladder: Secondary | ICD-10-CM | POA: Diagnosis not present

## 2019-11-18 DIAGNOSIS — Z7984 Long term (current) use of oral hypoglycemic drugs: Secondary | ICD-10-CM

## 2019-11-18 DIAGNOSIS — I4819 Other persistent atrial fibrillation: Secondary | ICD-10-CM | POA: Diagnosis present

## 2019-11-18 DIAGNOSIS — M546 Pain in thoracic spine: Secondary | ICD-10-CM | POA: Diagnosis present

## 2019-11-18 DIAGNOSIS — Z882 Allergy status to sulfonamides status: Secondary | ICD-10-CM

## 2019-11-18 DIAGNOSIS — E119 Type 2 diabetes mellitus without complications: Secondary | ICD-10-CM

## 2019-11-18 DIAGNOSIS — Z7982 Long term (current) use of aspirin: Secondary | ICD-10-CM

## 2019-11-18 DIAGNOSIS — Z87440 Personal history of urinary (tract) infections: Secondary | ICD-10-CM

## 2019-11-18 DIAGNOSIS — Z20822 Contact with and (suspected) exposure to covid-19: Secondary | ICD-10-CM | POA: Diagnosis present

## 2019-11-18 DIAGNOSIS — Z6841 Body Mass Index (BMI) 40.0 and over, adult: Secondary | ICD-10-CM

## 2019-11-18 DIAGNOSIS — D6859 Other primary thrombophilia: Secondary | ICD-10-CM | POA: Diagnosis present

## 2019-11-18 DIAGNOSIS — R531 Weakness: Secondary | ICD-10-CM

## 2019-11-18 DIAGNOSIS — C787 Secondary malignant neoplasm of liver and intrahepatic bile duct: Secondary | ICD-10-CM

## 2019-11-18 DIAGNOSIS — R9389 Abnormal findings on diagnostic imaging of other specified body structures: Secondary | ICD-10-CM | POA: Diagnosis present

## 2019-11-18 DIAGNOSIS — Z8673 Personal history of transient ischemic attack (TIA), and cerebral infarction without residual deficits: Secondary | ICD-10-CM

## 2019-11-18 DIAGNOSIS — N939 Abnormal uterine and vaginal bleeding, unspecified: Secondary | ICD-10-CM

## 2019-11-18 DIAGNOSIS — Z8049 Family history of malignant neoplasm of other genital organs: Secondary | ICD-10-CM

## 2019-11-18 LAB — SARS CORONAVIRUS 2 BY RT PCR (HOSPITAL ORDER, PERFORMED IN ~~LOC~~ HOSPITAL LAB): SARS Coronavirus 2: NEGATIVE

## 2019-11-18 LAB — COMPREHENSIVE METABOLIC PANEL
ALT: 12 U/L (ref 0–44)
AST: 23 U/L (ref 15–41)
Albumin: 3.5 g/dL (ref 3.5–5.0)
Alkaline Phosphatase: 78 U/L (ref 38–126)
Anion gap: 13 (ref 5–15)
BUN: 20 mg/dL (ref 8–23)
CO2: 26 mmol/L (ref 22–32)
Calcium: 8.6 mg/dL — ABNORMAL LOW (ref 8.9–10.3)
Chloride: 98 mmol/L (ref 98–111)
Creatinine, Ser: 1.27 mg/dL — ABNORMAL HIGH (ref 0.44–1.00)
GFR calc Af Amer: 50 mL/min — ABNORMAL LOW (ref >60)
GFR calc non Af Amer: 43 mL/min — ABNORMAL LOW (ref >60)
Glucose, Bld: 207 mg/dL — ABNORMAL HIGH (ref 70–99)
Potassium: 4.2 mmol/L (ref 3.5–5.1)
Sodium: 137 mmol/L (ref 135–145)
Total Bilirubin: 0.7 mg/dL (ref 0.3–1.2)
Total Protein: 7.6 g/dL (ref 6.5–8.1)

## 2019-11-18 LAB — CBC WITH DIFFERENTIAL/PLATELET
Abs Immature Granulocytes: 0.06 10*3/uL (ref 0.00–0.07)
Basophils Absolute: 0.1 10*3/uL (ref 0.0–0.1)
Basophils Relative: 1 %
Eosinophils Absolute: 0 10*3/uL (ref 0.0–0.5)
Eosinophils Relative: 0 %
HCT: 39.9 % (ref 36.0–46.0)
Hemoglobin: 12.4 g/dL (ref 12.0–15.0)
Immature Granulocytes: 0 %
Lymphocytes Relative: 8 %
Lymphs Abs: 1 10*3/uL (ref 0.7–4.0)
MCH: 27.3 pg (ref 26.0–34.0)
MCHC: 31.1 g/dL (ref 30.0–36.0)
MCV: 87.9 fL (ref 80.0–100.0)
Monocytes Absolute: 0.8 10*3/uL (ref 0.1–1.0)
Monocytes Relative: 6 %
Neutro Abs: 11.5 10*3/uL — ABNORMAL HIGH (ref 1.7–7.7)
Neutrophils Relative %: 85 %
Platelets: 279 10*3/uL (ref 150–400)
RBC: 4.54 MIL/uL (ref 3.87–5.11)
RDW: 15.1 % (ref 11.5–15.5)
WBC: 13.6 10*3/uL — ABNORMAL HIGH (ref 4.0–10.5)
nRBC: 0 % (ref 0.0–0.2)

## 2019-11-18 LAB — URINALYSIS, COMPLETE (UACMP) WITH MICROSCOPIC
Bilirubin Urine: NEGATIVE
Glucose, UA: NEGATIVE mg/dL
Ketones, ur: NEGATIVE mg/dL
Nitrite: NEGATIVE
Protein, ur: 100 mg/dL — AB
RBC / HPF: >50 RBC/hpf — ABNORMAL HIGH (ref 0–5)
Specific Gravity, Urine: 1.019 (ref 1.005–1.030)
WBC, UA: >50 WBC/hpf — ABNORMAL HIGH (ref 0–5)
pH: 6 (ref 5.0–8.0)

## 2019-11-18 LAB — TROPONIN I (HIGH SENSITIVITY)
Troponin I (High Sensitivity): 7 ng/L (ref <18)
Troponin I (High Sensitivity): 7 ng/L (ref <18)

## 2019-11-18 LAB — TSH
TSH: 2.54 u[IU]/mL (ref 0.350–4.500)
TSH: 1.372 u[IU]/mL (ref 0.350–4.500)

## 2019-11-18 LAB — MAGNESIUM: Magnesium: 2.1 mg/dL (ref 1.7–2.4)

## 2019-11-18 LAB — BRAIN NATRIURETIC PEPTIDE: B Natriuretic Peptide: 246.5 pg/mL — ABNORMAL HIGH (ref 0.0–100.0)

## 2019-11-18 LAB — ETHANOL: Alcohol, Ethyl (B): <10 mg/dL (ref <10)

## 2019-11-18 LAB — GLUCOSE, CAPILLARY: Glucose-Capillary: 140 mg/dL — ABNORMAL HIGH (ref 70–99)

## 2019-11-18 LAB — T4, FREE: Free T4: 1.24 ng/dL — ABNORMAL HIGH (ref 0.61–1.12)

## 2019-11-18 LAB — CK: Total CK: 66 U/L (ref 38–234)

## 2019-11-18 MED ORDER — SODIUM CHLORIDE 0.9% FLUSH
3.0000 mL | Freq: Two times a day (BID) | INTRAVENOUS | Status: DC
  Administered 2019-11-18 – 2019-11-23 (×8): 3 mL via INTRAVENOUS

## 2019-11-18 MED ORDER — ENOXAPARIN SODIUM 40 MG/0.4ML ~~LOC~~ SOLN
40.0000 mg | SUBCUTANEOUS | Status: DC
  Administered 2019-11-18: 40 mg via SUBCUTANEOUS
  Filled 2019-11-18: qty 0.4

## 2019-11-18 MED ORDER — POTASSIUM CHLORIDE CRYS ER 10 MEQ PO TBCR
10.0000 meq | EXTENDED_RELEASE_TABLET | Freq: Two times a day (BID) | ORAL | Status: DC
  Administered 2019-11-18 – 2019-11-23 (×9): 10 meq via ORAL
  Filled 2019-11-18 (×9): qty 1

## 2019-11-18 MED ORDER — ASPIRIN EC 81 MG PO TBEC
81.0000 mg | DELAYED_RELEASE_TABLET | Freq: Every day | ORAL | Status: DC
  Administered 2019-11-18 – 2019-11-19 (×2): 81 mg via ORAL
  Filled 2019-11-18 (×2): qty 1

## 2019-11-18 MED ORDER — FUROSEMIDE 40 MG PO TABS
40.0000 mg | ORAL_TABLET | Freq: Two times a day (BID) | ORAL | Status: DC
  Administered 2019-11-18 – 2019-11-23 (×8): 40 mg via ORAL
  Filled 2019-11-18 (×8): qty 1

## 2019-11-18 MED ORDER — ONDANSETRON HCL 4 MG PO TABS: 4.0000 mg | ORAL_TABLET | Freq: Four times a day (QID) | ORAL | Status: DC | PRN

## 2019-11-18 MED ORDER — SODIUM CHLORIDE 0.9 % IV SOLN
2.0000 g | Freq: Once | INTRAVENOUS | Status: AC
  Administered 2019-11-18: 2 g via INTRAVENOUS
  Filled 2019-11-18: qty 20

## 2019-11-18 MED ORDER — ACETAMINOPHEN 500 MG PO TABS
1000.0000 mg | ORAL_TABLET | Freq: Once | ORAL | Status: AC
  Administered 2019-11-18: 1000 mg via ORAL
  Filled 2019-11-18: qty 2

## 2019-11-18 MED ORDER — ONDANSETRON HCL 4 MG/2ML IJ SOLN: 4.0000 mg | Freq: Four times a day (QID) | INTRAMUSCULAR | Status: DC | PRN

## 2019-11-18 MED ORDER — POLYETHYLENE GLYCOL 3350 17 G PO PACK: 17.0000 g | PACK | Freq: Every day | ORAL | Status: DC | PRN

## 2019-11-18 MED ORDER — ACETAMINOPHEN 650 MG RE SUPP: 650.0000 mg | Freq: Four times a day (QID) | RECTAL | Status: DC | PRN

## 2019-11-18 MED ORDER — INSULIN ASPART 100 UNIT/ML ~~LOC~~ SOLN
0.0000 [IU] | Freq: Every day | SUBCUTANEOUS | Status: DC
  Administered 2019-11-20: 3 [IU] via SUBCUTANEOUS
  Administered 2019-11-21: 2 [IU] via SUBCUTANEOUS
  Filled 2019-11-18 (×2): qty 1

## 2019-11-18 MED ORDER — DILTIAZEM HCL 60 MG PO TABS
30.0000 mg | ORAL_TABLET | Freq: Once | ORAL | Status: AC
  Administered 2019-11-18: 30 mg via ORAL
  Filled 2019-11-18: qty 1

## 2019-11-18 MED ORDER — ATENOLOL 50 MG PO TABS
50.0000 mg | ORAL_TABLET | Freq: Every day | ORAL | Status: DC
  Administered 2019-11-18 – 2019-11-23 (×6): 50 mg via ORAL
  Filled 2019-11-18: qty 2
  Filled 2019-11-18 (×2): qty 1
  Filled 2019-11-18: qty 2
  Filled 2019-11-18: qty 1
  Filled 2019-11-18: qty 2

## 2019-11-18 MED ORDER — INSULIN ASPART 100 UNIT/ML ~~LOC~~ SOLN
0.0000 [IU] | Freq: Three times a day (TID) | SUBCUTANEOUS | Status: DC
  Administered 2019-11-20 – 2019-11-21 (×3): 2 [IU] via SUBCUTANEOUS
  Administered 2019-11-21 – 2019-11-22 (×3): 3 [IU] via SUBCUTANEOUS
  Filled 2019-11-18 (×6): qty 1

## 2019-11-18 MED ORDER — SODIUM CHLORIDE 0.9 % IV SOLN
2.0000 g | INTRAVENOUS | Status: DC
  Administered 2019-11-19 – 2019-11-20 (×2): 2 g via INTRAVENOUS
  Filled 2019-11-18: qty 2
  Filled 2019-11-18: qty 20
  Filled 2019-11-18: qty 2

## 2019-11-18 MED ORDER — ACETAMINOPHEN 325 MG PO TABS: 650.0000 mg | ORAL_TABLET | Freq: Four times a day (QID) | ORAL | Status: DC | PRN

## 2019-11-18 MED ORDER — DILTIAZEM HCL 60 MG PO TABS: 60.0000 mg | ORAL_TABLET | Freq: Once | ORAL | Status: DC

## 2019-11-18 MED ORDER — DILTIAZEM HCL ER 60 MG PO CP12
60.0000 mg | ORAL_CAPSULE | Freq: Two times a day (BID) | ORAL | Status: DC
  Administered 2019-11-18 – 2019-11-23 (×9): 60 mg via ORAL
  Filled 2019-11-18 (×11): qty 1

## 2019-11-18 MED ORDER — DILTIAZEM HCL 25 MG/5ML IV SOLN
10.0000 mg | Freq: Once | INTRAVENOUS | Status: AC
  Administered 2019-11-18: 10 mg via INTRAVENOUS
  Filled 2019-11-18: qty 5

## 2019-11-18 NOTE — ED Notes (Addendum)
Report given to [facility], RN and is okay with pt coming at 6:45

## 2019-11-18 NOTE — ED Notes (Addendum)
Pt ambulated to toilet with walker- pt states she felt weak but ambulated well with the walker- pt had blood in underwear and on a pad- when asked about the blood she states sometimes she scratches and makes herself bleed

## 2019-11-18 NOTE — ED Notes (Signed)
Pt assisted to toilet and states she would like to sit in chair for a little while

## 2019-11-18 NOTE — Discharge Instructions (Addendum)

## 2019-11-18 NOTE — ED Notes (Signed)
Pt had a BM but could not urinate

## 2019-11-18 NOTE — ED Notes (Signed)
X-ray at bedside

## 2019-11-18 NOTE — H&P (Addendum)
History and Physical    Brittney Shepard OZH:[id number] DOB: 1950/05/27 DOA: 11/18/2019  PCP: Patient, No Pcp Per   Patient coming from: Home  I have personally briefly reviewed patient's old medical records in [place]  Chief Complaint: Fall.  HPI: Brittney Shepard is a 69 y.o. female with medical history significant of diabetes mellitus, hypertension and prior stroke without any significant residual deficit was brought to ED after having a fall. Apparently patient fell while sleeping.  She took a turn and fell down from bed around 2:30 AM.  She remained on the floor till 7 AM when she heard her neighbor and called him for help.  They called the EMS. Patient has another fall 2 weeks ago under similar conditions while sleeping and falling down from the bed while turning. Patient denies any recent illnesses, upper respiratory symptoms, chest pain or shortness of breath.  She denies palpitations.  Denies any prior history of abnormal rhythm.  She do have some dysuria and history of getting frequent UTI.  Denies any fever or chills.  No nausea or vomiting or diarrhea.  She denies having any hematuria.  Never took any of her home meds today.  Patient lives alone and was very concerned going back home today, stating that she is feeling very weak after this fall and staying on floor for that long time.  ED Course: On presentation she was afebrile and tachycardic.  EKG with A. fib with RVR.  She was given IV Cardizem twice and heart rate comes down in 80s.  Labs only positive for mild leukocytosis.  UA with microscopic hematuria, pyuria, nitrites negative.  Imaging negative for any acute fractures.  There was some concern of bladder lesion, pelvic ultrasound was obtained which shows some endometrial thickening and a bladder lesion which needs further investigation by urology.   She was being admitted under observation for new onset A. fib and unsafe discharge.  Review of Systems: As per HPI  otherwise 10 point review of systems negative.   Past Medical History:  Diagnosis Date  . Diabetes mellitus without complication ([place])   . Hypertension   . Stroke [facility]) 2013    History reviewed. No pertinent surgical history.   reports that she has never smoked. She has never used smokeless tobacco. She reports that she does not drink alcohol and does not use drugs.  Allergies  Allergen Reactions  . Sulfa Antibiotics     No family history on file.  Prior to Admission medications   Medication Sig Start Date End Date Taking? Authorizing Provider  acetaminophen (TYLENOL) 325 MG tablet Take 2 tablets (650 mg total) by mouth every 6 (six) hours as needed for mild pain (or Fever >/= 101). 03/27/16   Bettey Costa, MD  aspirin EC 81 MG tablet Take 81 mg by mouth daily.    [provider]  atenolol (TENORMIN) 50 MG tablet Take 1 tablet (50 mg total) by mouth daily. 03/28/16   Bettey Costa, MD  diltiazem (CARDIZEM SR) 60 MG 12 hr capsule Take 1 capsule (60 mg total) by mouth every 12 (twelve) hours. 03/27/16   Bettey Costa, MD  ferrous sulfate 325 (65 FE) MG tablet Take 1 tablet (325 mg total) by mouth 2 (two) times daily with a meal. 03/27/16   Bettey Costa, MD  furosemide (LASIX) 40 MG tablet Take 1 tablet (40 mg total) by mouth 2 (two) times daily. 03/27/16   Bettey Costa, MD  ipratropium-albuterol (DUONEB) 0.5-2.5 (3) MG/3ML  SOLN Take 3 mLs by nebulization 3 (three) times daily. 03/27/16   Bettey Costa, MD  metFORMIN (GLUCOPHAGE) 500 MG tablet Take 500 mg by mouth daily with breakfast.    [provider]  potassium chloride (KLOR-CON) 10 MEQ tablet Take 10 mEq by mouth 2 (two) times daily.    [provider]  senna-docusate (SENOKOT-S) 8.6-50 MG tablet Take 1 tablet by mouth 2 (two) times daily. 03/27/16   Bettey Costa, MD    Physical Exam: Vitals:   11/18/19 1301 11/18/19 1330 11/18/19 1430 11/18/19 1500  BP: (!) 142/55 117/65 114/66 114/81  Pulse: (!) 105 77 86  88  Resp:  (!) 23 18 14   Temp:      TempSrc:      SpO2: 97% 99% 98% 98%  Weight:      Height:        General: Vital signs reviewed.  Patient is well-developed and well-nourished, in no acute distress and cooperative with exam.  Head: Normocephalic and atraumatic. Eyes: EOMI, conjunctivae normal, no scleral icterus.  ENMT: Mucous membranes are moist.  Neck: Supple, trachea midline, normal ROM, no JVD,  Cardiovascular: Irregularly irregular, no murmurs, gallops, or rubs. Pulmonary/Chest: Clear to auscultation bilaterally, no wheezes, rales, or rhonchi. Abdominal: Soft, non-tender, non-distended, BS +, Extremities: 2+ LE lower extremity edema bilaterally, signs of chronic venous congestion, left anterior lower leg with a blister filled with blood, pulses symmetric and intact bilaterally. Neurological: A&O x3, Strength is normal and symmetric bilaterally, cranial nerve II-XII are grossly intact, no focal motor deficit, sensory intact to light touch bilaterally.  Psychiatric: Normal mood and affect. speech and behavior is normal.   Labs on Admission: I have personally reviewed following labs and imaging studies  CBC: Recent Labs  Lab 11/18/19 0919  WBC 13.6*  NEUTROABS 11.5*  HGB 12.4  HCT 39.9  MCV 87.9  PLT 353   Basic Metabolic Panel: Recent Labs  Lab 11/18/19 0919  NA 137  K 4.2  CL 98  CO2 26  GLUCOSE 207*  BUN 20  CREATININE 1.27*  CALCIUM 8.6*  MG 2.1   GFR: Estimated Creatinine Clearance: 51.8 mL/min (A) (by C-G formula based on SCr of 1.27 mg/dL (H)). Liver Function Tests: Recent Labs  Lab 11/18/19 0919  AST 23  ALT 12  ALKPHOS 78  BILITOT 0.7  PROT 7.6  ALBUMIN 3.5   No results for input(s): LIPASE, AMYLASE in the last 168 hours. No results for input(s): AMMONIA in the last 168 hours. Coagulation Profile: No results for input(s): INR, PROTIME in the last 168 hours. Cardiac Enzymes: Recent Labs  Lab 11/18/19 0919  CKTOTAL 66   BNP (last 3  results) No results for input(s): PROBNP in the last 8760 hours. HbA1C: No results for input(s): HGBA1C in the last 72 hours. CBG: No results for input(s): GLUCAP in the last 168 hours. Lipid Profile: No results for input(s): CHOL, HDL, LDLCALC, TRIG, CHOLHDL, LDLDIRECT in the last 72 hours. Thyroid Function Tests: Recent Labs    11/18/19 0919  TSH 2.540  FREET4 1.24*   Anemia Panel: No results for input(s): VITAMINB12, FOLATE, FERRITIN, TIBC, IRON, RETICCTPCT in the last 72 hours. Urine analysis:    Component Value Date/Time   COLORURINE AMBER (A) 11/18/2019 1533   APPEARANCEUR CLOUDY (A) 11/18/2019 1533   LABSPEC 1.019 11/18/2019 1533   PHURINE 6.0 11/18/2019 1533   GLUCOSEU NEGATIVE 11/18/2019 1533   HGBUR LARGE (A) 11/18/2019 1533   BILIRUBINUR NEGATIVE 11/18/2019 1533  KETONESUR NEGATIVE 11/18/2019 1533   PROTEINUR 100 (A) 11/18/2019 1533   NITRITE NEGATIVE 11/18/2019 1533   LEUKOCYTESUR SMALL (A) 11/18/2019 1533    Radiological Exams on Admission: DG Tibia/Fibula Right  Result Date: 11/18/2019 CLINICAL DATA:  Status post fall. Right lower leg pain. Initial encounter. EXAM: RIGHT TIBIA AND FIBULA - 2 VIEW COMPARISON:  None. FINDINGS: No acute bony or joint abnormality. Severe knee and talonavicular osteoarthritis noted. Soft tissues are unremarkable. IMPRESSION: No acute abnormality. Electronically Signed   By: Inge Rise M.D.   On: 11/18/2019 10:16   US Pelvis Complete  Result Date: 11/18/2019 CLINICAL DATA:  Postmenopausal patient with vaginal bleeding. EXAM: TRANSABDOMINAL ULTRASOUND OF PELVIS TECHNIQUE: Transabdominal ultrasound examination of the pelvis was performed including evaluation of the uterus, ovaries, adnexal regions, and pelvic cul-de-sac. COMPARISON:  None. FINDINGS: Uterus Measurements: 11.5 x 6.2 x 7.2 cm = volume: 270 mL. No fibroids or other mass visualized. Endometrium Thickness: 3.5 cm.  No focal abnormality visualized. Right ovary  Measurements: Not visualized. Left ovary Measurements: Not visualized. Other findings: Imaging of the urinary bladder demonstrates an echogenic lesion within the bladder measuring 4.3 x 2.8 x 6.5 cm. There is flow within the lesion on Doppler imaging. IMPRESSION: Endometrial thickness is considered abnormal for a post-menopausal female. Endometrial sampling should be considered to exclude carcinoma. Lesion within the urinary bladder with flow on Doppler imaging worrisome for neoplasm. Recommend consult with urology. Electronically Signed   By: Inge Rise M.D.   On: 11/18/2019 14:55   DG Pelvis Portable  Result Date: 11/18/2019 CLINICAL DATA:  Status post fall.  Pain.  Initial encounter. EXAM: PORTABLE PELVIS 1-2 VIEWS COMPARISON:  None. FINDINGS: There is no evidence of pelvic fracture or diastasis. No pelvic bone lesions are seen. IMPRESSION: Negative exam. Electronically Signed   By: Inge Rise M.D.   On: 11/18/2019 10:16   DG Chest Portable 1 View  Result Date: 11/18/2019 CLINICAL DATA:  Status post fall today. EXAM: PORTABLE CHEST 1 VIEW COMPARISON:  None. FINDINGS: The lungs are clear. Heart size is upper normal. No pneumothorax or pleural effusion. No acute or focal bony abnormality. IMPRESSION: No acute disease. Electronically Signed   By: Inge Rise M.D.   On: 11/18/2019 10:12   DG Knee Complete 4 Views Left  Result Date: 11/18/2019 CLINICAL DATA:  Status post fall. Right knee pain. Initial encounter. EXAM: LEFT KNEE - COMPLETE 4+ VIEW COMPARISON:  None. FINDINGS: There is no acute bony or joint abnormality. Severe tricompartmental osteoarthritis noted. No joint effusion. IMPRESSION: No acute finding. Severe tricompartmental osteoarthritis. Electronically Signed   By: Inge Rise M.D.   On: 11/18/2019 10:15    EKG: Independently reviewed.  A. fib with RVR.  Assessment/Plan Active Problems:   Atrial fibrillation with RVR ([facility])   A. fib with RVR.  Rate improved with  IV Cardizem.  Patient was on Cardizem and Tenormin at home and never took her medications.  Patient was not aware of any prior history of abnormal rhythm or A. Fib.  She was admitted in 2017 with altered mental status and A. fib was mentioned in discharge.  Prior EKG done in 2017 with A. Fib.  On discharge summary she was never started on anticoagulation due to concern of microcytic anemia. Troponin negative and BNP mildly elevated at 246. CHA2DS2-VASc score of 4. -Restart home dose of Cardizem and atenolol. -Check TSH. -Cardiology consult-patient is not established with any cardiologist. -Discussed regarding starting anticoagulation-she will think about it.  UTI.  UA with hematuria and pyuria.  History of recurrent urinary infections.  No prior culture found.  She does has some pyuria although remained afebrile and mild leukocytosis which can also be explained with fall. -Ceftriaxone. -Follow-up urinary cultures.  Falls.  Patient has 2 falls recently while sleeping and rolling over from bed.  No history of leg weakness.  She uses walker to walk around. -Pt/OT evaluation for [facility] and home health needs. -Get a bed rail  Bladder lesion.  Accidental finding of bladder lesion on pelvic x-ray followed by abdominal pelvic ultrasound. -Needs an outpatient urology evaluation.  Increased endometrial thickening.  Found on pelvic ultrasound.  Denies any post menopausal bleeding. -Needs gynecologic evaluation as an outpatient.  Hypertension. -Continue home dose of atenolol and Cardizem.  Chronic lower extremity edema.  Echocardiogram done in 3790 with diastolic dysfunction. -Continue home dose of Lasix. -Repeat echocardiogram  Type 2 diabetes.  Was on Metformin at home. -Check A1c -SSI  Morbid obesity. Body mass index is 41.6 kg/m.  DVT prophylaxis: Lovenox Code Status: Full code Family Communication: No family at bedside Disposition Plan: Home Consults called: Cardiology Admission  status: Observation   Lorella Nimrod MD [facility]  If 7PM-7AM, please contact night-coverage [email or web address]  11/18/2019, 6:05 PM   This record has been created using Systems analyst. Errors have been sought and corrected,but may not always be located. Such creation errors do not reflect on the standard of care.

## 2019-11-18 NOTE — Progress Notes (Signed)
   11/18/19 2111  Assess: MEWS Score  Temp 97.7 F (36.5 C)  BP 127/77  Pulse Rate (!) 113  Resp 19  Level of Consciousness Alert  SpO2 100 %  O2 Device Nasal Cannula  O2 Flow Rate (L/min) 2 L/min  Assess: MEWS Score  MEWS Temp 0  MEWS Systolic 0  MEWS Pulse 2  MEWS RR 0  MEWS LOC 0  MEWS Score 2  MEWS Score Color Yellow  Assess: if the MEWS score is Yellow or Red  Were vital signs taken at a resting state? Yes  Focused Assessment Documented focused assessment  Early Detection of Sepsis Score *See Row Information* Low  MEWS guidelines implemented *See Row Information* Yes  Treat  MEWS Interventions Administered scheduled meds/treatments;Escalated (See documentation below)  Take Vital Signs  Increase Vital Sign Frequency  Yellow: Q 2hr X 2 then Q 4hr X 2, if remains yellow, continue Q 4hrs  Escalate  MEWS: Escalate Yellow: discuss with charge nurse/RN and consider discussing with provider and RRT  Notify: Charge Nurse/RN  Name of Charge Nurse/RN Notified Remo Lipps  Date Charge Nurse/RN Notified 11/18/19  Time Charge Nurse/RN Notified 1930  Notify: Provider  Provider Name/Title Sharion Settler  Date Provider Notified 11/18/19  Time Provider Notified 2115  Notification Type Page  Notification Reason Change in status (yellow mews)  Response See new orders  Date of Provider Response 11/18/19  Time of Provider Response 2116  Yellow Mews due to increased, irregular HR between 90-130s. Due Cardizem given, overdue atenolol given. Charge Rn and NP informed. Vitals signs done per yellow Mews protocol.

## 2019-11-18 NOTE — ED Triage Notes (Addendum)
Pt arrives via EMS from home after she slid out of bed- pt denies hitting head- pt had been in floor since 2:30 AM- per EMS pt has been increasingly weak- son has concerns about pt being by herself- pt uses a walker to get around normally- pt has a bruise on her right leg that is swollen- pt has problems with her left knee

## 2019-11-18 NOTE — ED Provider Notes (Signed)
Urinalysis shows likely UTI.  She has a history of recurrent UTIs.  On repeat assessment here, the patient states she does feel fatigued and is worried about going home and falling again.  She has had significant increased fatigue recently.  She lives alone.  Given her leukocytosis, tachycardia with A. fib RVR with any movement, UTI, age, and difficulty ambulating with recent fall and prolonged downtime, discussed management options with the patient and she would feel much safer being admitted for at least observation which I think is reasonable.   Duffy Bruce, MD 11/18/19 1747

## 2019-11-18 NOTE — ED Provider Notes (Signed)
[facility] Emergency Department Provider Note  ____________________________________________   First MD Initiated Contact with Patient 11/18/19 [phone]     (approximate)  I have reviewed the triage vital signs and the nursing notes.   HISTORY  Chief Complaint Fall    HPI Brittney Shepard is a 69 y.o. female with diabetes, A. fib who comes in for fall.  Patient reports that she slid of the bed.  She states that this happened at 2 AM.  She was unable to get up out of the ground due to chronic left knee pain.  She reports that she hit her right tib-fib and has a blood blister there.  She did not hit her head, did not lose consciousness.  She states that she just was not able to get up and her phone had died and so she was not brought in till 9 AM.  She denies any chest wall pain, abdominal pain, worsening leg swelling.  She has been compliant with her Lasix.  She does not report having a history of A. fib although it is listed in her chart.  Currently her pain is minimal.          Past Medical History:  Diagnosis Date  . Diabetes mellitus without complication ([place])   . Hypertension   . Stroke [facility]) 2013    Patient Active Problem List   Diagnosis Date Noted  . DNR (do not resuscitate)   . Palliative care by specialist   . Acute respiratory failure with hypoxia and hypercapnia ([place])   . Atrial fibrillation with rapid ventricular response ([place]) 03/20/2016  . Chronic obstructive pulmonary disease with acute exacerbation (HCC)     No past surgical history on file.  Prior to Admission medications   Medication Sig Start Date End Date Taking? Authorizing Provider  acetaminophen (TYLENOL) 325 MG tablet Take 2 tablets (650 mg total) by mouth every 6 (six) hours as needed for mild pain (or Fever >/= 101). 03/27/16   Bettey Costa, MD  aspirin EC 81 MG tablet Take 81 mg by mouth daily.    [provider]  atenolol (TENORMIN) 50 MG tablet Take 1 tablet (50  mg total) by mouth daily. 03/28/16   Bettey Costa, MD  diltiazem (CARDIZEM SR) 60 MG 12 hr capsule Take 1 capsule (60 mg total) by mouth every 12 (twelve) hours. 03/27/16   Bettey Costa, MD  ferrous sulfate 325 (65 FE) MG tablet Take 1 tablet (325 mg total) by mouth 2 (two) times daily with a meal. 03/27/16   Bettey Costa, MD  furosemide (LASIX) 40 MG tablet Take 1 tablet (40 mg total) by mouth 2 (two) times daily. 03/27/16   Bettey Costa, MD  ipratropium-albuterol (DUONEB) 0.5-2.5 (3) MG/3ML SOLN Take 3 mLs by nebulization 3 (three) times daily. 03/27/16   Bettey Costa, MD  metFORMIN (GLUCOPHAGE) 500 MG tablet Take 500 mg by mouth daily with breakfast.    [provider]  potassium chloride 20 MEQ TBCR Take 10 mEq by mouth daily. 03/27/16   Bettey Costa, MD  senna-docusate (SENOKOT-S) 8.6-50 MG tablet Take 1 tablet by mouth 2 (two) times daily. 03/27/16   Bettey Costa, MD    Allergies Sulfa antibiotics  No family history on file.  Social History Social History   Tobacco Use  . Smoking status: Never Smoker  . Smokeless tobacco: Never Used  Substance Use Topics  . Alcohol use: No  . Drug use: No      Review  of Systems Constitutional: No fever/chills, falls Eyes: No visual changes. ENT: No sore throat. Cardiovascular: Denies chest pain. Respiratory: Denies shortness of breath. Gastrointestinal: No abdominal pain.  No nausea, no vomiting.  No diarrhea.  No constipation. Genitourinary: Negative for dysuria. Musculoskeletal: Negative for back pain.  Leg pain, knee pain Skin: Negative for rash. Neurological: Negative for headaches, focal weakness or numbness. All other ROS negative ____________________________________________   PHYSICAL EXAM:  VITAL SIGNS: ED Triage Vitals  Enc Vitals Group     BP 11/18/19 0903 (!) 155/116     Pulse Rate 11/18/19 0903 87     Resp 11/18/19 0903 20     Temp --      Temp src --      SpO2 11/18/19 0903 95 %     Weight 11/18/19 0907 250  lb (113.4 kg)     Height 11/18/19 0907 5\' 5"  (1.651 m)     Head Circumference --      Peak Flow --      Pain Score 11/18/19 0904 0     Pain Loc --      Pain Edu? --      Excl. in [place]? --     Constitutional: Alert and oriented. Well appearing and in no acute distress. Eyes: Conjunctivae are normal. EOMI. Head: Atraumatic. Nose: No congestion/rhinnorhea. Mouth/Throat: Mucous membranes are moist.   Neck: No stridor. Trachea Midline. FROM Cardiovascular: Irregular, tachycardic grossly normal heart sounds.  Good peripheral circulation. Respiratory: Normal respiratory effort.  No retractions. Lungs CTAB. Gastrointestinal: Soft and nontender. No distention. No abdominal bruits.  Musculoskeletal: Tenderness to the left knee, tenderness to the right tib-fib with blood blister no joint effusions.  Able to lift both legs up off the bed Neurologic:  Normal speech and language. No gross focal neurologic deficits are appreciated.  Skin:  Skin is warm, dry and intact. No rash noted. Psychiatric: Mood and affect are normal. Speech and behavior are normal. GU: Deferred   ____________________________________________   LABS (all labs ordered are listed, but only abnormal results are displayed)  Labs Reviewed  CBC WITH DIFFERENTIAL/PLATELET - Abnormal; Notable for the following components:      Result Value   WBC 13.6 (*)    Neutro Abs 11.5 (*)    All other components within normal limits  URINE CULTURE  COMPREHENSIVE METABOLIC PANEL  BRAIN NATRIURETIC PEPTIDE  TSH  T4, FREE  MAGNESIUM  URINALYSIS, COMPLETE (UACMP) WITH MICROSCOPIC  ETHANOL  CK  TROPONIN I (HIGH SENSITIVITY)   ____________________________________________   ED ECG REPORT I, Vanessa [place], the attending physician, personally viewed and interpreted this ECG.  Atrial fibrillation rate of 126, no ST elevation, no T wave inversions, normal intervals ____________________________________________  RADIOLOGY Robert Bellow,  personally viewed and evaluated these images (plain radiographs) as part of my medical decision making, as well as reviewing the written report by the radiologist.  ED MD interpretation: X-rays have no evidence of fracture  Official radiology report(s): DG Tibia/Fibula Right  Result Date: 11/18/2019 CLINICAL DATA:  Status post fall. Right lower leg pain. Initial encounter. EXAM: RIGHT TIBIA AND FIBULA - 2 VIEW COMPARISON:  None. FINDINGS: No acute bony or joint abnormality. Severe knee and talonavicular osteoarthritis noted. Soft tissues are unremarkable. IMPRESSION: No acute abnormality. Electronically Signed   By: Inge Rise M.D.   On: 11/18/2019 10:16   DG Pelvis Portable  Result Date: 11/18/2019 CLINICAL DATA:  Status post fall.  Pain.  Initial encounter. EXAM: PORTABLE  PELVIS 1-2 VIEWS COMPARISON:  None. FINDINGS: There is no evidence of pelvic fracture or diastasis. No pelvic bone lesions are seen. IMPRESSION: Negative exam. Electronically Signed   By: Inge Rise M.D.   On: 11/18/2019 10:16   DG Chest Portable 1 View  Result Date: 11/18/2019 CLINICAL DATA:  Status post fall today. EXAM: PORTABLE CHEST 1 VIEW COMPARISON:  None. FINDINGS: The lungs are clear. Heart size is upper normal. No pneumothorax or pleural effusion. No acute or focal bony abnormality. IMPRESSION: No acute disease. Electronically Signed   By: Inge Rise M.D.   On: 11/18/2019 10:12   DG Knee Complete 4 Views Left  Result Date: 11/18/2019 CLINICAL DATA:  Status post fall. Right knee pain. Initial encounter. EXAM: LEFT KNEE - COMPLETE 4+ VIEW COMPARISON:  None. FINDINGS: There is no acute bony or joint abnormality. Severe tricompartmental osteoarthritis noted. No joint effusion. IMPRESSION: No acute finding. Severe tricompartmental osteoarthritis. Electronically Signed   By: Inge Rise M.D.   On: 11/18/2019 10:15    ____________________________________________   PROCEDURES  Procedure(s)  performed (including Critical Care):  .Critical Care Performed by: Vanessa [place], MD Authorized by: Vanessa Porter, MD   Critical care provider statement:    Critical care time (minutes):  45   Critical care was necessary to treat or prevent imminent or life-threatening deterioration of the following conditions:  Cardiac failure   Critical care was time spent personally by me on the following activities:  Discussions with consultants, evaluation of patient's response to treatment, examination of patient, ordering and performing treatments and interventions, ordering and review of laboratory studies, ordering and review of radiographic studies, pulse oximetry, re-evaluation of patient's condition, obtaining history from patient or surrogate and review of old charts     ____________________________________________   INITIAL IMPRESSION / [place] / ED COURSE  Laranda Burkemper was evaluated in Emergency Department on 11/18/2019 for the symptoms described in the history of present illness. She was evaluated in the context of the global COVID-19 pandemic, which necessitated consideration that the patient might be at risk for infection with the SARS-CoV-2 virus that causes COVID-19. Institutional protocols and algorithms that pertain to the evaluation of patients at risk for COVID-19 are in a state of rapid change based on information released by regulatory bodies including the CDC and federal and state organizations. These policies and algorithms were followed during the patient's care in the ED.    Patient is a 69 year old who comes in with fall and unable to stand up after multiple hours.  Will get labs to evaluate Electra abnormalities, rhabdo, UTI.  Denies hitting her head to suggest intracranial hemorrhage, no C-spine tenderness to suggest cervical fracture.  No chest wall tenderness to suggest rib fractures, no abdominal tenderness to suggest abdominal pathology.  Patient denies having  history of A. fib but is in A. fib with RVR.  On review of records she was admitted 3 years ago for this.  Did not start a blood thinner due to her anemia.  However she is prescribed diltiazem.  Patient unsure if she has been compliant with this.  Patient will be kept on the cardiac monitor due to her A. fib with RVR.  Patient was given 10 mg of diltiazem and heart rate came down to the low 100 so we will give some oral dealt seem to keep her down.  Get labs to evaluate for Electra abnormalities, AKI, rhabdo, UTI.  White count slightly elevated at 13.6 but  no other signs of infection.  She is afebrile.  Her kidney functions at 1.27 her BNP is down from prior does not look significantly fluid overloaded on exam.  Cardiac marker is negative her CK level is negative.  Patient does have severe osteoarthritis of that left knee but no signs of fracture.  The rest of her x-rays were reassuring.  Heart rates have come down with the medication.  Will attempt to ambulate patient to see how she does   3:56 PM patient was able to ambulate around the room with her walker.  Discussed with both her and the son about goals of care.  Patient does live by herself but she is able to ambulate.  At this time patient not interested in SNF or nursing home placement.  I offered social work to come by to help with home resources or they can talk with her primary care doctor.  They prefer to talk to their primary care doctor.  She did mention that she was having some vaginal bleeding.  I did insert 1 finger into her vagina they did reveal vaginal bleeding.  She had no rectal bleeding on exam.  Her hemoglobin is stable.  We ordered an ultrasound evaluate for this vaginal bleeding and there was concern for endometrial thickening.  I have placed a referral for her to follow-up with OB/GYN.  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  I have let patient know about this and have given her follow-up with Dr.  Jeb Levering and sent him a message so he is aware.  They will help schedule her outpatient follow-up.  Patient would like to be able to go home.  She is only pending urine.  She was able to give a urine for [place].  Most likely patient will be discharged with appropriate follow-up.  I also encouraged her son to go through her medications to make sure that she is been compliant with her diltiazem.  I recommended that she have her primary care doctor recheck her heart rates in 2 days because she may need to go up on her diltiazem if her heart rates are staying elevated even went to being compliant with her medication.  Patient never had any symptoms from her A. fib with RVR to note.  Her T4 was also slightly elevated which I encouraged her to follow-up with her primary care doctor.  No evidence of thyroid storm at this time  Patient had off to oncoming pending pending UA and final disposition    ____________________________________________   FINAL CLINICAL IMPRESSION(S) / ED DIAGNOSES   Final diagnoses:  Bladder mass  Atrial fibrillation with rapid ventricular response (HCC)  Vaginal bleeding  Fall, initial encounter  Acute cystitis without hematuria      MEDICATIONS GIVEN DURING THIS VISIT:  Medications  enoxaparin (LOVENOX) injection 40 mg ( Subcutaneous Canceled Entry 11/18/19 2107)  sodium chloride flush (NS) 0.9 % injection 3 mL (3 mLs Intravenous Given 11/18/19 2111)  acetaminophen (TYLENOL) tablet 650 mg (has no administration in time range)    Or  acetaminophen (TYLENOL) suppository 650 mg (has no administration in time range)  polyethylene glycol (MIRALAX / GLYCOLAX) packet 17 g (has no administration in time range)  ondansetron (ZOFRAN) tablet 4 mg (has no administration in time range)    Or  ondansetron (ZOFRAN) injection 4 mg (has no administration in time range)  insulin aspart (novoLOG) injection 0-15 Units (has no administration in time range)  insulin aspart (novoLOG)  injection  0-5 Units (0 Units Subcutaneous Not Given 11/18/19 2121)  aspirin EC tablet 81 mg (81 mg Oral Given 11/18/19 1928)  atenolol (TENORMIN) tablet 50 mg (50 mg Oral Given 11/18/19 1952)  diltiazem (CARDIZEM SR) 12 hr capsule 60 mg ( Oral Canceled Entry 11/18/19 2103)  furosemide (LASIX) tablet 40 mg (40 mg Oral Given 11/18/19 1929)  potassium chloride (KLOR-CON) CR tablet 10 mEq ( Oral Canceled Entry 11/18/19 2108)  cefTRIAXone (ROCEPHIN) 2 g in sodium chloride 0.9 % 100 mL IVPB (has no administration in time range)  diltiazem (CARDIZEM) injection 10 mg (10 mg Intravenous Given 11/18/19 0928)  acetaminophen (TYLENOL) tablet 1,000 mg (1,000 mg Oral Given 11/18/19 0954)  diltiazem (CARDIZEM) tablet 30 mg (30 mg Oral Given 11/18/19 0954)  cefTRIAXone (ROCEPHIN) 2 g in sodium chloride 0.9 % 100 mL IVPB (0 g Intravenous Stopped 11/18/19 1801)     ED Discharge Orders    None       Note:  This document was prepared using Dragon voice recognition software and may include unintentional dictation errors.   Vanessa Burden, MD 11/19/19 [phone]

## 2019-11-19 ENCOUNTER — Encounter: Payer: Self-pay | Admitting: Internal Medicine

## 2019-11-19 ENCOUNTER — Observation Stay: Payer: Medicare Other

## 2019-11-19 ENCOUNTER — Observation Stay (HOSPITAL_COMMUNITY)
Admit: 2019-11-19 | Discharge: 2019-11-19 | Disposition: A | Discharge status: Home / Self Care | Payer: Medicare Other | Attending: Internal Medicine | Admitting: Internal Medicine

## 2019-11-19 DIAGNOSIS — N939 Abnormal uterine and vaginal bleeding, unspecified: Secondary | ICD-10-CM | POA: Diagnosis present

## 2019-11-19 DIAGNOSIS — N1330 Unspecified hydronephrosis: Secondary | ICD-10-CM | POA: Diagnosis not present

## 2019-11-19 DIAGNOSIS — I4891 Unspecified atrial fibrillation: Secondary | ICD-10-CM

## 2019-11-19 DIAGNOSIS — R310 Gross hematuria: Secondary | ICD-10-CM

## 2019-11-19 DIAGNOSIS — M546 Pain in thoracic spine: Secondary | ICD-10-CM | POA: Diagnosis present

## 2019-11-19 DIAGNOSIS — Z20822 Contact with and (suspected) exposure to covid-19: Secondary | ICD-10-CM | POA: Diagnosis present

## 2019-11-19 DIAGNOSIS — R531 Weakness: Secondary | ICD-10-CM | POA: Diagnosis not present

## 2019-11-19 DIAGNOSIS — C670 Malignant neoplasm of trigone of bladder: Secondary | ICD-10-CM | POA: Diagnosis present

## 2019-11-19 DIAGNOSIS — Z87440 Personal history of urinary (tract) infections: Secondary | ICD-10-CM | POA: Diagnosis not present

## 2019-11-19 DIAGNOSIS — N3000 Acute cystitis without hematuria: Secondary | ICD-10-CM | POA: Diagnosis not present

## 2019-11-19 DIAGNOSIS — Z880 Allergy status to penicillin: Secondary | ICD-10-CM | POA: Diagnosis not present

## 2019-11-19 DIAGNOSIS — I4819 Other persistent atrial fibrillation: Secondary | ICD-10-CM | POA: Diagnosis present

## 2019-11-19 DIAGNOSIS — R296 Repeated falls: Secondary | ICD-10-CM | POA: Diagnosis present

## 2019-11-19 DIAGNOSIS — Z124 Encounter for screening for malignant neoplasm of cervix: Secondary | ICD-10-CM | POA: Diagnosis present

## 2019-11-19 DIAGNOSIS — N131 Hydronephrosis with ureteral stricture, not elsewhere classified: Secondary | ICD-10-CM | POA: Diagnosis present

## 2019-11-19 DIAGNOSIS — N329 Bladder disorder, unspecified: Secondary | ICD-10-CM | POA: Diagnosis not present

## 2019-11-19 DIAGNOSIS — Z8673 Personal history of transient ischemic attack (TIA), and cerebral infarction without residual deficits: Secondary | ICD-10-CM | POA: Diagnosis not present

## 2019-11-19 DIAGNOSIS — R1909 Other intra-abdominal and pelvic swelling, mass and lump: Secondary | ICD-10-CM | POA: Diagnosis not present

## 2019-11-19 DIAGNOSIS — N3001 Acute cystitis with hematuria: Secondary | ICD-10-CM | POA: Diagnosis present

## 2019-11-19 DIAGNOSIS — Z6841 Body Mass Index (BMI) 40.0 and over, adult: Secondary | ICD-10-CM | POA: Diagnosis not present

## 2019-11-19 DIAGNOSIS — C787 Secondary malignant neoplasm of liver and intrahepatic bile duct: Secondary | ICD-10-CM | POA: Diagnosis present

## 2019-11-19 DIAGNOSIS — N132 Hydronephrosis with renal and ureteral calculous obstruction: Secondary | ICD-10-CM | POA: Diagnosis not present

## 2019-11-19 DIAGNOSIS — I10 Essential (primary) hypertension: Secondary | ICD-10-CM | POA: Diagnosis present

## 2019-11-19 DIAGNOSIS — D6859 Other primary thrombophilia: Secondary | ICD-10-CM | POA: Diagnosis present

## 2019-11-19 DIAGNOSIS — Z7984 Long term (current) use of oral hypoglycemic drugs: Secondary | ICD-10-CM | POA: Diagnosis not present

## 2019-11-19 DIAGNOSIS — M25562 Pain in left knee: Secondary | ICD-10-CM | POA: Diagnosis present

## 2019-11-19 DIAGNOSIS — R935 Abnormal findings on diagnostic imaging of other abdominal regions, including retroperitoneum: Secondary | ICD-10-CM

## 2019-11-19 DIAGNOSIS — N3289 Other specified disorders of bladder: Secondary | ICD-10-CM | POA: Diagnosis not present

## 2019-11-19 DIAGNOSIS — W19XXXA Unspecified fall, initial encounter: Secondary | ICD-10-CM | POA: Diagnosis not present

## 2019-11-19 DIAGNOSIS — C679 Malignant neoplasm of bladder, unspecified: Secondary | ICD-10-CM | POA: Diagnosis not present

## 2019-11-19 DIAGNOSIS — Z7982 Long term (current) use of aspirin: Secondary | ICD-10-CM | POA: Diagnosis not present

## 2019-11-19 DIAGNOSIS — Z1151 Encounter for screening for human papillomavirus (HPV): Secondary | ICD-10-CM | POA: Diagnosis not present

## 2019-11-19 DIAGNOSIS — Z882 Allergy status to sulfonamides status: Secondary | ICD-10-CM | POA: Diagnosis not present

## 2019-11-19 DIAGNOSIS — N950 Postmenopausal bleeding: Secondary | ICD-10-CM

## 2019-11-19 DIAGNOSIS — R600 Localized edema: Secondary | ICD-10-CM | POA: Diagnosis present

## 2019-11-19 DIAGNOSIS — D494 Neoplasm of unspecified behavior of bladder: Secondary | ICD-10-CM | POA: Diagnosis not present

## 2019-11-19 DIAGNOSIS — Z8051 Family history of malignant neoplasm of kidney: Secondary | ICD-10-CM | POA: Diagnosis not present

## 2019-11-19 DIAGNOSIS — E119 Type 2 diabetes mellitus without complications: Secondary | ICD-10-CM | POA: Diagnosis present

## 2019-11-19 DIAGNOSIS — Z8049 Family history of malignant neoplasm of other genital organs: Secondary | ICD-10-CM | POA: Diagnosis not present

## 2019-11-19 LAB — CBC
HCT: 34.8 % — ABNORMAL LOW (ref 36.0–46.0)
Hemoglobin: 10.8 g/dL — ABNORMAL LOW (ref 12.0–15.0)
MCH: 27.6 pg (ref 26.0–34.0)
MCHC: 31 g/dL (ref 30.0–36.0)
MCV: 89 fL (ref 80.0–100.0)
Platelets: 256 10*3/uL (ref 150–400)
RBC: 3.91 MIL/uL (ref 3.87–5.11)
RDW: 15.3 % (ref 11.5–15.5)
WBC: 7.9 10*3/uL (ref 4.0–10.5)
nRBC: 0 % (ref 0.0–0.2)

## 2019-11-19 LAB — ECHOCARDIOGRAM COMPLETE
Height: 65 in
S' Lateral: 2.97 cm
Weight: 4684.33 oz

## 2019-11-19 LAB — BASIC METABOLIC PANEL
Anion gap: 7 (ref 5–15)
BUN: 19 mg/dL (ref 8–23)
CO2: 29 mmol/L (ref 22–32)
Calcium: 7.9 mg/dL — ABNORMAL LOW (ref 8.9–10.3)
Chloride: 101 mmol/L (ref 98–111)
Creatinine, Ser: 1.1 mg/dL — ABNORMAL HIGH (ref 0.44–1.00)
GFR calc Af Amer: 59 mL/min — ABNORMAL LOW (ref >60)
GFR calc non Af Amer: 51 mL/min — ABNORMAL LOW (ref >60)
Glucose, Bld: 139 mg/dL — ABNORMAL HIGH (ref 70–99)
Potassium: 3.8 mmol/L (ref 3.5–5.1)
Sodium: 137 mmol/L (ref 135–145)

## 2019-11-19 LAB — HIV ANTIBODY (ROUTINE TESTING W REFLEX): HIV Screen 4th Generation wRfx: NONREACTIVE

## 2019-11-19 LAB — GLUCOSE, CAPILLARY
Glucose-Capillary: 135 mg/dL — ABNORMAL HIGH (ref 70–99)
Glucose-Capillary: 163 mg/dL — ABNORMAL HIGH (ref 70–99)
Glucose-Capillary: 108 mg/dL — ABNORMAL HIGH (ref 70–99)
Glucose-Capillary: 163 mg/dL — ABNORMAL HIGH (ref 70–99)

## 2019-11-19 LAB — URINE CULTURE

## 2019-11-19 LAB — HEMOGLOBIN A1C
Hgb A1c MFr Bld: 6.2 % — ABNORMAL HIGH (ref 4.8–5.6)
Mean Plasma Glucose: 131.24 mg/dL

## 2019-11-19 MED ORDER — IOHEXOL 300 MG/ML  SOLN
125.0000 mL | Freq: Once | INTRAMUSCULAR | Status: AC | PRN
  Administered 2019-11-19: 125 mL via INTRAVENOUS

## 2019-11-19 MED ORDER — SODIUM CHLORIDE 0.9 % IV SOLN
INTRAVENOUS | Status: DC | PRN
  Administered 2019-11-19: 500 mL via INTRAVENOUS
  Administered 2019-11-20: 250 mL via INTRAVENOUS

## 2019-11-19 NOTE — Evaluation (Signed)
Physical Therapy Evaluation Patient Details Name: Brittney Shepard MRN: [id number] DOB: Jul 18, 1950 Today's Date: 11/19/2019   History of Present Illness  Brittney Shepard is a 69 y.o. female with medical history significant of diabetes mellitus, hypertension and prior stroke without any significant residual deficit was brought to ED after having a fall.Apparently patient fell while sleeping.  She took a turn and fell down from bed around 2:30 AM.  She remained on the floor till 7 AM when she heard her neighbor and called him for help.  They called the EMS.Patient has another fall 2 weeks ago under similar conditions while sleeping and falling down from the bed while turning.Brittney Shepard is a 69 y.o. female with medical history significant of diabetes mellitus, hypertension and prior stroke without any significant residual deficit was brought to ED after having a fall.Apparently patient fell while sleeping.  She took a turn and fell down from bed around 2:30 AM.  She remained on the floor till 7 AM when she heard her neighbor and called him for help.  They called the EMS.Patient has another fall 2 weeks ago under similar conditions while sleeping and falling down from the bed while turning. Patient reports she lives alone, with a ramp to enter home, that her son drives her (as she does not drive) to community activites and she uses a [facility] regularly; though she has a RW she has been trying to use since last fall.  Clinical Impression  Patient is motivated to participate in session. MinA for supine > sit transfers with use of HOB elevated and bedrails. Is able to complete STS transfer with RW with minA for initiation and cuing for set up/hand placement. Following STS and stand pivot transfer patient pt is very fatigued, unable to attempt ambulation. HR in 90s throughout transfers which is an improvement. PT recommends SNF at this time for safety concerns. Would benefit from skilled PT to address above  deficits and promote optimal return to PLOF.     Follow Up Recommendations SNF    Equipment Recommendations  Rolling walker with 5" wheels    Recommendations for Other Services OT consult     Precautions / Restrictions        Mobility  Bed Mobility Overal bed mobility: Needs Assistance Bed Mobility: Supine to Sit     Supine to sit: Min assist     General bed mobility comments: minA for initiation and heavy cuing with HOB elevated and bedrails with good carry over  Transfers Overall transfer level: Needs assistance   Transfers: Sit to/from Stand Sit to Stand: Min assist         General transfer comment: assistance needed for initation; cuing for set up/hand placement  Ambulation/Gait Ambulation/Gait assistance: Min guard Gait Distance (Feet): 3 Feet Assistive device: Rolling walker (2 wheeled) Gait Pattern/deviations: Shuffle Gait velocity: decreased   General Gait Details: Shepard shuffle steps to chair with RW  Stairs            Wheelchair Mobility    Modified Rankin (Stroke Patients Only)       Balance Overall balance assessment: Needs assistance Sitting-balance support: Feet supported;No upper extremity supported Sitting balance-Leahy Scale: Good     Standing balance support: Bilateral upper extremity supported;During functional activity Standing balance-Leahy Scale: Fair                               Pertinent Vitals/Pain Pain Assessment: Faces  Faces Pain Scale: Hurts a little bit Pain Location: L anterior tib Pain Descriptors / Indicators: Aching;Dull Pain Intervention(s): Limited activity within patient's tolerance    Home Living Family/patient expects to be discharged to:: Assisted living Living Arrangements: Alone             Home Equipment: Walker - 2 wheels;Shower seat;Cane - single point;Bedside commode      Prior Function Level of Independence: Independent with assistive device(s)         Comments:  Reports that other than these 2 falls out of bed while sleeping she ahs not had falls. Is IND with ADLs and walks with SPC at home and community, though she has been trying to use RW since fall 2 months ago     Hand Dominance   Dominant Hand: Right    Extremity/Trunk Assessment   Upper Extremity Assessment Upper Extremity Assessment: Generalized weakness    Lower Extremity Assessment Lower Extremity Assessment: Generalized weakness    Cervical / Trunk Assessment Cervical / Trunk Assessment: Normal  Communication   Communication: No difficulties  Cognition Arousal/Alertness: Awake/alert Behavior During Therapy: WFL for tasks assessed/performed Overall Cognitive Status: Within Functional Limits for tasks assessed                                        General Comments      Exercises Other Exercises Other Exercises: Supine > sit minA with cuing for use of rails with good carry over Other Exercises: STS with RW cuing for hand palcement and set up with minA needed for initiaton, followed by Shepard shuffle steps to chair   Assessment/Plan    PT Assessment Patient needs continued PT services  PT Problem List Decreased strength;Decreased mobility;Decreased range of motion;Decreased coordination;Decreased activity tolerance;Decreased balance;Pain       PT Treatment Interventions DME instruction;Therapeutic activities;Gait training;Therapeutic exercise;Patient/family education;Stair training;Functional mobility training;Neuromuscular re-education;Manual techniques;Balance training    PT Goals (Current goals can be found in the Care Plan section)  Acute Rehab PT Goals Patient Stated Goal: to go home PT Goal Formulation: With patient Time For Goal Achievement: 12/03/19 Potential to Achieve Goals: Fair    Frequency Min 2X/week   Barriers to discharge        Co-evaluation               AM-PAC PT "6 Clicks" Mobility  Outcome Measure Help needed turning  from your back to your side while in a flat bed without using bedrails?: A Little Help needed moving from lying on your back to sitting on the side of a flat bed without using bedrails?: A Little Help needed moving to and from a bed to a chair (including a wheelchair)?: A Little Help needed standing up from a chair using your arms (e.g., wheelchair or bedside chair)?: A Little Help needed to walk in hospital room?: A Lot Help needed climbing 3-5 steps with a railing? : Total 6 Click Score: 15    End of Session Equipment Utilized During Treatment: Gait belt Activity Tolerance: Patient tolerated treatment well Patient left: in chair;with call bell/phone within reach;with chair alarm set Nurse Communication: Mobility status PT Visit Diagnosis: Unsteadiness on feet (R26.81);Other abnormalities of gait and mobility (R26.89);Repeated falls (R29.6);Difficulty in walking, not elsewhere classified (R26.2);Muscle weakness (generalized) (M62.81);History of falling (Z91.81)    Time: 1140-1200 PT Time Calculation (min) (ACUTE ONLY): 20 min   Charges:  PT Evaluation $PT Eval Moderate Complexity: 1 Mod PT Treatments $Therapeutic Activity: 8-22 mins       Durwin Reges DPT  Durwin Reges 11/19/2019, 1:31 PM

## 2019-11-19 NOTE — Consult Note (Signed)
Cardiology Consultation:   Patient ID: Brittney Shepard MRN: [id number]; DOB: Feb 11, 1951  Admit date: 11/18/2019 Date of Consult: 11/19/2019  Primary Care Provider: Patient, No Pcp Per [place] Cardiologist: No primary care provider on file.  [place] Electrophysiologist:  None    Patient Profile:   Brittney Shepard is a 69 y.o. female with a hx of HTN/DM/A Fib who is being seen today for the evaluation of atrial fibrillation with RVR at the request of Dr Reesa Chew.  History of Present Illness:   Brittney Shepard is a 69 year old woman presenting after a fall, noted to be in atrial fibrillation with RVR on admission.  The patient was in her normal state of health.  She had been up to the bathroom and back on the day of admission.  She then rolled out of bed and fell on the floor.  She was too weak to get up.  Her neighbor came to her home in the morning to feed her dog and she was able to yell down and let her know that she needed help.  She was brought to the hospital for further care.  The patient does have a prior documented history of atrial fibrillation in 2017.  She has not been evaluated by cardiology as an outpatient and she does not appear to be aware of this diagnosis.  She has been treated over time with calcium channel blockade and beta-blockade but has not been on anticoagulation.  She does have a remote history of TIA.  The patient lives alone.  She denies chest pain or pressure.  She does have exertional dyspnea which is chronic and unchanged.  She denies orthopnea or PND.  She has chronic leg edema and takes oral diuretics.  She denies bleeding problems.  She has had 2 falls in the last 6 months.  She is widowed for the past year.  She no longer drives an automobile.  Her daughter-in-law does her grocery shopping for her.   Past Medical History:  Diagnosis Date  . Diabetes mellitus without complication ([place])   . Hypertension   . Stroke [facility]) 2013    History reviewed. No  pertinent surgical history.   Home Medications:  Prior to Admission medications   Medication Sig Start Date End Date Taking? Authorizing Provider  aspirin EC 81 MG tablet Take 81 mg by mouth daily.   Yes [provider]  atenolol (TENORMIN) 50 MG tablet Take 1 tablet (50 mg total) by mouth daily. 03/28/16  Yes Mody, Ulice Bold, MD  diltiazem (CARDIZEM SR) 60 MG 12 hr capsule Take 1 capsule (60 mg total) by mouth every 12 (twelve) hours. 03/27/16  Yes Bettey Costa, MD  ferrous sulfate 325 (65 FE) MG tablet Take 1 tablet (325 mg total) by mouth 2 (two) times daily with a meal. 03/27/16  Yes Mody, Sital, MD  furosemide (LASIX) 40 MG tablet Take 1 tablet (40 mg total) by mouth 2 (two) times daily. 03/27/16  Yes Mody, Sital, MD  ipratropium-albuterol (DUONEB) 0.5-2.5 (3) MG/3ML SOLN Take 3 mLs by nebulization 3 (three) times daily. 03/27/16  Yes Mody, Ulice Bold, MD  metFORMIN (GLUCOPHAGE) 500 MG tablet Take 500 mg by mouth daily with breakfast.   Yes [provider]  potassium chloride (KLOR-CON) 10 MEQ tablet Take 10 mEq by mouth 2 (two) times daily.   Yes [provider]  senna-docusate (SENOKOT-S) 8.6-50 MG tablet Take 1 tablet by mouth 2 (two) times daily. 03/27/16  Yes Bettey Costa, MD  acetaminophen (TYLENOL)  325 MG tablet Take 2 tablets (650 mg total) by mouth every 6 (six) hours as needed for mild pain (or Fever >/= 101). 03/27/16   Bettey Costa, MD    Inpatient Medications: Scheduled Meds: . atenolol  50 mg Oral Daily  . diltiazem  60 mg Oral Q12H  . furosemide  40 mg Oral BID  . insulin aspart  0-15 Units Subcutaneous TID WC  . insulin aspart  0-5 Units Subcutaneous QHS  . potassium chloride  10 mEq Oral BID  . sodium chloride flush  3 mL Intravenous Q12H   Continuous Infusions: . cefTRIAXone (ROCEPHIN)  IV     PRN Meds: acetaminophen **OR** acetaminophen, ondansetron **OR** ondansetron (ZOFRAN) IV, polyethylene glycol  Allergies:    Allergies  Allergen  Reactions  . Penicillin G Hives  . Sulfa Antibiotics Rash    Social History:   Social History   Socioeconomic History  . Marital status: Widowed    Spouse name: Not on file  . Number of children: Not on file  . Years of education: Not on file  . Highest education level: Not on file  Occupational History  . Not on file  Tobacco Use  . Smoking status: Never Smoker  . Smokeless tobacco: Never Used  Substance and Sexual Activity  . Alcohol use: No  . Drug use: No  . Sexual activity: Not on file  Other Topics Concern  . Not on file  Social History Narrative  . Not on file   Social Determinants of Health   Financial Resource Strain:   . Difficulty of Paying Living Expenses:   Food Insecurity:   . Worried About Charity fundraiser in the Last Year:   . Arboriculturist in the Last Year:   Transportation Needs:   . Film/video editor (Medical):   Marland Kitchen Lack of Transportation (Non-Medical):   Physical Activity:   . Days of Exercise per Week:   . Minutes of Exercise per Session:   Stress:   . Feeling of Stress :   Social Connections:   . Frequency of Communication with Friends and Family:   . Frequency of Social Gatherings with Friends and Family:   . Attends Religious Services:   . Active Member of Clubs or Organizations:   . Attends Archivist Meetings:   Marland Kitchen Marital Status:   Intimate Partner Violence:   . Fear of Current or Ex-Partner:   . Emotionally Abused:   Marland Kitchen Physically Abused:   . Sexually Abused:     Family History:   No family history on file.   ROS:  Please see the history of present illness.  All other ROS reviewed and negative.     Physical Exam/Data:   Vitals:   11/18/19 2251 11/19/19 0308 11/19/19 0634 11/19/19 1211  BP: 126/82 109/67 110/72 120/79  Pulse: (!) 107 88 89 75  Resp: 19 16 16    Temp: 97.7 F (36.5 C) 97.7 F (36.5 C) 98 F (36.7 C) 97.9 F (36.6 C)  TempSrc: Oral Oral Oral Oral  SpO2: 100% 99% 93% 97%  Weight:       Height:        Intake/Output Summary (Last 24 hours) at 11/19/2019 1315 Last data filed at 11/19/2019 0914 Gross per 24 hour  Intake 580 ml  Output 650 ml  Net -70 ml   Last 3 Weights 11/18/2019 11/18/2019 03/20/2016  Weight (lbs) 292 lb 12.3 oz 250 lb 353 lb 3.2 oz  Weight (  kg) 132.8 kg 113.399 kg 160.21 kg     Body mass index is 48.72 kg/m.  General: Pleasant, obese woman, in no acute distress HEENT: normal Lymph: no adenopathy Neck: no JVD Endocrine:  No thryomegaly Vascular: No carotid bruits;   Cardiac: Irregularly irregular with no murmur Lungs:  clear to auscultation bilaterally, no wheezing, rhonchi or rales  Abd: soft, nontender, no hepatomegaly  Ext: 1+ bilateral ankle and pretibial edema Musculoskeletal:  No deformities, BUE and BLE strength normal and equal Skin: warm and dry  Neuro:  CNs 2-12 intact, no focal abnormalities noted Psych:  Normal affect   EKG:  The EKG was personally reviewed and demonstrates: Atrial fibrillation with RVR  Telemetry:  Telemetry was personally reviewed and demonstrates: Atrial fibrillation, heart rate in the 80s  Relevant CV Studies: Echo personally reviewed, limited windows noted.  LVEF appears normal.  Basal septal hypertrophy is present.  No significant valvular disease noted.  Severe left atrial enlargement noted.  Laboratory Data:  High Sensitivity Troponin:   Recent Labs  Lab 11/18/19 0919 11/18/19 1306  TROPONINIHS 7 7     Chemistry Recent Labs  Lab 11/18/19 0919 11/19/19 0418  NA 137 137  K 4.2 3.8  CL 98 101  CO2 26 29  GLUCOSE 207* 139*  BUN 20 19  CREATININE 1.27* 1.10*  CALCIUM 8.6* 7.9*  GFRNONAA 43* 51*  GFRAA 50* 59*  ANIONGAP 13 7    Recent Labs  Lab 11/18/19 0919  PROT 7.6  ALBUMIN 3.5  AST 23  ALT 12  ALKPHOS 78  BILITOT 0.7   Hematology Recent Labs  Lab 11/18/19 0919 11/19/19 0418  WBC 13.6* 7.9  RBC 4.54 3.91  HGB 12.4 10.8*  HCT 39.9 34.8*  MCV 87.9 89.0  MCH 27.3 27.6   MCHC 31.1 31.0  RDW 15.1 15.3  PLT 279 256   BNP Recent Labs  Lab 11/18/19 0919  BNP 246.5*    DDimer No results for input(s): DDIMER in the last 168 hours.   Radiology/Studies:  DG Tibia/Fibula Right  Result Date: 11/18/2019 CLINICAL DATA:  Status post fall. Right lower leg pain. Initial encounter. EXAM: RIGHT TIBIA AND FIBULA - 2 VIEW COMPARISON:  None. FINDINGS: No acute bony or joint abnormality. Severe knee and talonavicular osteoarthritis noted. Soft tissues are unremarkable. IMPRESSION: No acute abnormality. Electronically Signed   By: Inge Rise M.D.   On: 11/18/2019 10:16   US Pelvis Complete  Result Date: 11/18/2019 CLINICAL DATA:  Postmenopausal patient with vaginal bleeding. EXAM: TRANSABDOMINAL ULTRASOUND OF PELVIS TECHNIQUE: Transabdominal ultrasound examination of the pelvis was performed including evaluation of the uterus, ovaries, adnexal regions, and pelvic cul-de-sac. COMPARISON:  None. FINDINGS: Uterus Measurements: 11.5 x 6.2 x 7.2 cm = volume: 270 mL. No fibroids or other mass visualized. Endometrium Thickness: 3.5 cm.  No focal abnormality visualized. Right ovary Measurements: Not visualized. Left ovary Measurements: Not visualized. Other findings: Imaging of the urinary bladder demonstrates an echogenic lesion within the bladder measuring 4.3 x 2.8 x 6.5 cm. There is flow within the lesion on Doppler imaging. IMPRESSION: Endometrial thickness is considered abnormal for a post-menopausal female. Endometrial sampling should be considered to exclude carcinoma. Lesion within the urinary bladder with flow on Doppler imaging worrisome for neoplasm. Recommend consult with urology. Electronically Signed   By: Inge Rise M.D.   On: 11/18/2019 14:55   DG Pelvis Portable  Result Date: 11/18/2019 CLINICAL DATA:  Status post fall.  Pain.  Initial encounter. EXAM: PORTABLE PELVIS  1-2 VIEWS COMPARISON:  None. FINDINGS: There is no evidence of pelvic fracture or  diastasis. No pelvic bone lesions are seen. IMPRESSION: Negative exam. Electronically Signed   By: Inge Rise M.D.   On: 11/18/2019 10:16   DG Chest Portable 1 View  Result Date: 11/18/2019 CLINICAL DATA:  Status post fall today. EXAM: PORTABLE CHEST 1 VIEW COMPARISON:  None. FINDINGS: The lungs are clear. Heart size is upper normal. No pneumothorax or pleural effusion. No acute or focal bony abnormality. IMPRESSION: No acute disease. Electronically Signed   By: Inge Rise M.D.   On: 11/18/2019 10:12   DG Knee Complete 4 Views Left  Result Date: 11/18/2019 CLINICAL DATA:  Status post fall. Right knee pain. Initial encounter. EXAM: LEFT KNEE - COMPLETE 4+ VIEW COMPARISON:  None. FINDINGS: There is no acute bony or joint abnormality. Severe tricompartmental osteoarthritis noted. No joint effusion. IMPRESSION: No acute finding. Severe tricompartmental osteoarthritis. Electronically Signed   By: Inge Rise M.D.   On: 11/18/2019 10:15    Assessment and Plan:   Atrial fibrillation, suspect permanent.  The patient has not had close cardiology follow-up, but there is atrial fibrillation previously documented in 2017.  I have reviewed the patient's echocardiogram and while the images are very limited, she clearly has severe left atrial enlargement.  She has been treated with AV nodal blocking agents.  The patient has not been chronically anticoagulated even with her prior stroke.  Her CHA2DS2-VASc score equals 6 for female gender, age, prior stroke, hypertension, and diabetes.  She understands the indication for chronic oral anticoagulation.  We discussed the risks as well as the benefits.  This patients CHA2DS2-VASc Score and unadjusted Ischemic Stroke Rate (% per year) is equal to 9.7 % stroke rate/year from a score of 6  Above score calculated as 1 point each if present [CHF, HTN, DM, Vascular=MI/PAD/Aortic Plaque, Age if 65-74, or Female] Above score calculated as 2 points each if  present [Age > 75, or Stroke/TIA/TE]  Despite this discussion, the patient does not want to start on oral anticoagulation.  She understands her increased risk of stroke.  She wishes to continue on aspirin 81 mg daily.  I have not recommended any other changes in her medical program.  I think she has been in atrial fibrillation now most likely for several years and this was just incidentally noted at the time of her admission.  Will arrange outpatient cardiology follow-up.  For questions or updates, please contact [place] Please consult [email or web address] for contact info under   Signed, Sherren Mocha, MD  11/19/2019 1:15 PM

## 2019-11-19 NOTE — Progress Notes (Signed)
*  PRELIMINARY RESULTS* Echocardiogram 2D Echocardiogram has been performed.  Brittney Shepard 11/19/2019, 9:00 AM

## 2019-11-19 NOTE — Evaluation (Signed)
Occupational Therapy Evaluation Patient Details Name: Brittney Shepard MRN: [id number] DOB: 11-09-1950 Today's Date: 11/19/2019    History of Present Illness Brittney Shepard is a 69 y.o. female with medical history significant of diabetes mellitus, hypertension and prior stroke without any significant residual deficit was brought to ED after having a fall. Apparently patient fell while sleeping.  She took a turn and fell down from bed around 2:30 AM.  She remained on the floor till 7 AM when she heard her neighbor and called him for help. Patient lives alone and was very concerned going back home stating that she is feeling very weak after this fall and staying on floor for that long time.   Clinical Impression   Brittney Shepard was seen for OT evaluation this date. Prior to hospital admission, pt was MOD I for mobility and ADLs using SPC or RW as needed. Pt lives alone at [place]. Pt presents to acute OT demonstrating impaired ADL performance and functional mobility 2/2 functional strength/ROM/balance deficits, decreased LB access, and decreased activity tolerance. Pt currently requires MIN A for ADL t/f and ~2 side steps at EOB. SBA doff B socks, MOD A don B socks seated EOB. Pt fatigued quickly seated EOB, noted to have increased WOB and SpO2 95%, upon return to supine SpO2 93% - pt instructed in PLB techniques. Pt would benefit from skilled OT to address noted impairments and functional limitations (see below for any additional details) in order to maximize safety and independence while minimizing falls risk and caregiver burden. Upon hospital discharge, recommend STR to maximize pt safety and return to PLOF.     Follow Up Recommendations  SNF    Equipment Recommendations   (TBD next venue )    Recommendations for Other Services       Precautions / Restrictions Precautions Precautions: Fall Restrictions Weight Bearing Restrictions: No      Mobility Bed Mobility Overal bed mobility: Needs  Assistance Bed Mobility: Supine to Sit;Sit to Supine     Supine to sit: Mod assist;HOB elevated Sit to supine: Mod assist   General bed mobility comments: Assist for BLE managmenet  Transfers Overall transfer level: Needs assistance Equipment used: 2 person hand held assist Transfers: Sit to/from Stand Sit to Stand: Min assist;From elevated surface         General transfer comment: assist for inititation and achieving upright posture    Balance Overall balance assessment: Needs assistance Sitting-balance support: Feet supported;No upper extremity supported Sitting balance-Leahy Scale: Good     Standing balance support: Bilateral upper extremity supported;During functional activity Standing balance-Leahy Scale: Fair                             ADL either performed or assessed with clinical judgement   ADL Overall ADL's : Needs assistance/impaired                                       General ADL Comments: MIN A + 2HHA for ADL t/f. SBA doff B socks, MOD A don B socks seated EOB.      Vision         Perception     Praxis      Pertinent Vitals/Pain Pain Assessment: Faces Faces Pain Scale: Hurts a little bit Pain Location: L anterior tib Pain Descriptors / Indicators: Aching;Dull Pain Intervention(s): Limited  activity within patient's tolerance     Hand Dominance Right   Extremity/Trunk Assessment Upper Extremity Assessment Upper Extremity Assessment: Generalized weakness   Lower Extremity Assessment Lower Extremity Assessment: Generalized weakness   Cervical / Trunk Assessment Cervical / Trunk Assessment: Normal   Communication Communication Communication: No difficulties   Cognition Arousal/Alertness: Awake/alert Behavior During Therapy: WFL for tasks assessed/performed Overall Cognitive Status: Within Functional Limits for tasks assessed                                     General Comments  Seated EOB  SpO2 95% c increased WOB noted. Upon return to supine SpO2 93% - pt educated re: PLB techniques     Exercises Exercises: Other exercises Other Exercises Other Exercises: Pt educated re: OT role, DME recs, d/c recs, safe t/f technique Other Exercises: LBD, sup<>sit, sit<>stand, sitting/standing balance/tolerance, ~2 side steps EOB   Shoulder Instructions      Home Living Family/patient expects to be discharged to:: Assisted living Living Arrangements: Alone                           Home Equipment: Walker - 2 wheels;Cane - single point;Bedside commode          Prior Functioning/Environment Level of Independence: Independent with assistive device(s)        Comments: Reports that other than these 2 falls out of bed while sleeping she ahs not had falls. Is IND with ADLs and walks with SPC at home and community, though she has been trying to use RW since fall 2 months ago        OT Problem List: Decreased strength;Decreased range of motion;Decreased activity tolerance;Impaired balance (sitting and/or standing)      OT Treatment/Interventions: Self-care/ADL training;Therapeutic exercise;Energy conservation;DME and/or AE instruction;Therapeutic activities;Patient/family education;Balance training    OT Goals(Current goals can be found in the care plan section) Acute Rehab OT Goals Patient Stated Goal: to get stronger  OT Goal Formulation: With patient Time For Goal Achievement: 12/03/19 Potential to Achieve Goals: Fair ADL Goals Pt Will Perform Grooming: sitting;with supervision Pt Will Perform Lower Body Dressing: sit to/from stand;with min assist;with min guard assist (c LRAD PRN) Pt Will Transfer to Toilet: with min assist;stand pivot transfer;bedside commode (c LRAD PRN)  OT Frequency: Min 1X/week   Barriers to D/C: Decreased caregiver support          Co-evaluation              AM-PAC OT "6 Clicks" Daily Activity     Outcome Measure Help from  another person eating meals?: None Help from another person taking care of personal grooming?: A Little Help from another person toileting, which includes using toliet, bedpan, or urinal?: A Lot Help from another person bathing (including washing, rinsing, drying)?: A Lot Help from another person to put on and taking off regular upper body clothing?: A Little Help from another person to put on and taking off regular lower body clothing?: A Lot 6 Click Score: 16   End of Session    Activity Tolerance: Patient tolerated treatment well Patient left: in bed;with call bell/phone within reach;with bed alarm set  OT Visit Diagnosis: Other abnormalities of gait and mobility (R26.89);Repeated falls (R29.6)                Time: 8676-1950 OT Time Calculation (min): 14 min Charges:  OT General Charges $OT Visit: 1 Visit OT Evaluation $OT Eval Low Complexity: 1 Low OT Treatments $Self Care/Home Management : 8-22 mins  Dessie Coma, M.S. OTR/L  11/19/19, 3:18 PM

## 2019-11-19 NOTE — Consult Note (Signed)
Obstetrics & Gynecology Consult H&P   Consulting Department:Medicine  Consulting Physician: Fritzi Mandes, MD  Consulting Question:Postmenopausal bleeding, abnormal imgaing   History of Present Illness: Patient is a 69 y.o. female presenting to emergency department opn 11/18/2019 post fall. On presentation patient was noted to be in A-fib.  The patient was given IV Cardizem in the ED.  As part of the work up for her fall the patient underwent X-ray with concern for a pelvic lesion.  Subsequent pelvic ultrasound demonstrated an echogenic 4.3 x 2.8 x 6.5cm lesion within the urinary bladder, the endometrial lining was thickened at 3.5cm without evidence of focal lesion.  There was no significant vascularity or doppler flow noted to the endometrium.  No free fluid or ascites noted.  Subsequent CT A/P without contrast 11/19/2019 showed two bladder lesions 4.0 x 3.7 x 3.7cm and 3.1 x 1.8 1.7cm with obstruction of the left ureter causing hydronephrosis.  The uterus was read as containing a 6.3 x 4.5 x 4.0cm heterogenous endometrial mass.  The patient has had one prior pregnancy delivered via cesarean section 34 years ago.  She underwent menopause at approximately age 7.  She reports that for several years she has had intermittent vaginal bleeding occasionally lasting up to a week.  No abdominal pain or cramping.  She has noted intermittent thoracic back pain.    Review of Systems:10 point review of systems  Past Medical History:  Patient Active Problem List   Diagnosis Date Noted  . Atrial fibrillation ([place]) 11/19/2019  . Hematuria, gross   . Atrial fibrillation with RVR ([place]) 11/18/2019  . Bladder mass   . Fall   . Acute cystitis without hematuria   . DNR (do not resuscitate)   . Palliative care by specialist   . Acute respiratory failure with hypoxia and hypercapnia ([place])   . Atrial fibrillation with rapid ventricular response ([place]) 03/20/2016  . Chronic obstructive pulmonary disease with acute  exacerbation [facility])     Past Surgical History:  History reviewed. No pertinent surgical history.  Gynecologic History: Denies history of abnormal pap smear, menopausal since early 65's  Family History:  Family history is notable for renal cancer in her sister, cervical cancer in her mother.  No family history of breast, ovarian, or colon cancer.  Social History:  Social History   Socioeconomic History  . Marital status: Widowed    Spouse name: Not on file  . Number of children: Not on file  . Years of education: Not on file  . Highest education level: Not on file  Occupational History  . Not on file  Tobacco Use  . Smoking status: Never Smoker  . Smokeless tobacco: Never Used  Substance and Sexual Activity  . Alcohol use: No  . Drug use: No  . Sexual activity: Not on file  Other Topics Concern  . Not on file  Social History Narrative  . Not on file   Social Determinants of Health   Financial Resource Strain:   . Difficulty of Paying Living Expenses:   Food Insecurity:   . Worried About Charity fundraiser in the Last Year:   . Arboriculturist in the Last Year:   Transportation Needs:   . Film/video editor (Medical):   [place] Kitchen Lack of Transportation (Non-Medical):   Physical Activity:   . Days of Exercise per Week:   . Minutes of Exercise per Session:   Stress:   . Feeling of Stress :   Social  Connections:   . Frequency of Communication with Friends and Family:   . Frequency of Social Gatherings with Friends and Family:   . Attends Religious Services:   . Active Member of Clubs or Organizations:   . Attends Archivist Meetings:   Mar[place] Kitchen Marital Status:   Intimate Partner Violence:   . Fear of Current or Ex-Partner:   . Emotionally Abused:   [place] Kitchen Physically Abused:   . Sexually Abused:     Allergies:  Allergies  Allergen Reactions  . Penicillin G Hives  . Sulfa Antibiotics Rash    Medications: Prior to Admission medications   Medication Sig Start  Date End Date Taking? Authorizing Provider  aspirin EC 81 MG tablet Take 81 mg by mouth daily.   Yes [provider]  atenolol (TENORMIN) 50 MG tablet Take 1 tablet (50 mg total) by mouth daily. 03/28/16  Yes Mody, Ulice Bold, MD  diltiazem (CARDIZEM SR) 60 MG 12 hr capsule Take 1 capsule (60 mg total) by mouth every 12 (twelve) hours. 03/27/16  Yes Bettey Costa, MD  ferrous sulfate 325 (65 FE) MG tablet Take 1 tablet (325 mg total) by mouth 2 (two) times daily with a meal. 03/27/16  Yes Mody, Sital, MD  furosemide (LASIX) 40 MG tablet Take 1 tablet (40 mg total) by mouth 2 (two) times daily. 03/27/16  Yes Mody, Sital, MD  ipratropium-albuterol (DUONEB) 0.5-2.5 (3) MG/3ML SOLN Take 3 mLs by nebulization 3 (three) times daily. 03/27/16  Yes Mody, Ulice Bold, MD  metFORMIN (GLUCOPHAGE) 500 MG tablet Take 500 mg by mouth daily with breakfast.   Yes [provider]  potassium chloride (KLOR-CON) 10 MEQ tablet Take 10 mEq by mouth 2 (two) times daily.   Yes [provider]  senna-docusate (SENOKOT-S) 8.6-50 MG tablet Take 1 tablet by mouth 2 (two) times daily. 03/27/16  Yes Bettey Costa, MD  acetaminophen (TYLENOL) 325 MG tablet Take 2 tablets (650 mg total) by mouth every 6 (six) hours as needed for mild pain (or Fever >/= 101). 03/27/16   Bettey Costa, MD    Physical Exam Vitals: Blood pressure 120/79, pulse 75, temperature 97.9 F (36.6 C), temperature source Oral, resp. rate 16, height 5\' 5"  (1.651 m), weight 132.8 kg, SpO2 97 %. General: NAD, obese, appears stated age 2: normocephalic, anicteric Pulmonary: No increased work of breathing Cardiovascular: RRR, distal pulses 2+ Abdomen: NABS, soft, non-tender, non-distended,  Genitourinary: deferred to clinic given imaging Extremities: no edema, erythema, or tenderness Neurologic: Grossly intact Psychiatric: mood appropriate, affect full  Labs: Results for orders placed or performed during the hospital encounter of 11/18/19  (from the past 72 hour(s))  CBC with Differential     Status: Abnormal   Collection Time: 11/18/19  9:19 AM  Result Value Ref Range   WBC 13.6 (H) 4.0 - 10.5 K/uL   RBC 4.54 3.87 - 5.11 MIL/uL   Hemoglobin 12.4 12.0 - 15.0 g/dL   HCT 39.9 36 - 46 %   MCV 87.9 80.0 - 100.0 fL   MCH 27.3 26.0 - 34.0 pg   MCHC 31.1 30.0 - 36.0 g/dL   RDW 15.1 11.5 - 15.5 %   Platelets 279 150 - 400 K/uL   nRBC 0.0 0.0 - 0.2 %   Neutrophils Relative % 85 %   Neutro Abs 11.5 (H) 1.7 - 7.7 K/uL   Lymphocytes Relative 8 %   Lymphs Abs 1.0 0.7 - 4.0 K/uL   Monocytes Relative 6 %   Monocytes Absolute  0.8 0 - 1 K/uL   Eosinophils Relative 0 %   Eosinophils Absolute 0.0 0 - 0 K/uL   Basophils Relative 1 %   Basophils Absolute 0.1 0 - 0 K/uL   Immature Granulocytes 0 %   Abs Immature Granulocytes 0.06 0.00 - 0.07 K/uL    Comment: Performed at [facility], [address] [place], [place] [address]  Comprehensive metabolic panel     Status: Abnormal   Collection Time: 11/18/19  9:19 AM  Result Value Ref Range   Sodium 137 135 - 145 mmol/L   Potassium 4.2 3.5 - 5.1 mmol/L   Chloride 98 98 - 111 mmol/L   CO2 26 22 - 32 mmol/L   Glucose, Bld 207 (H) 70 - 99 mg/dL    Comment: Glucose reference range applies only to samples taken after fasting for at least 8 hours.   BUN 20 8 - 23 mg/dL   Creatinine, Ser 1.27 (H) 0.44 - 1.00 mg/dL   Calcium 8.6 (L) 8.9 - 10.3 mg/dL   Total Protein 7.6 6.5 - 8.1 g/dL   Albumin 3.5 3.5 - 5.0 g/dL   AST 23 15 - 41 U/L   ALT 12 0 - 44 U/L   Alkaline Phosphatase 78 38 - 126 U/L   Total Bilirubin 0.7 0.3 - 1.2 mg/dL   GFR calc non Af Amer 43 (L) >60 mL/min   GFR calc Af Amer 50 (L) >60 mL/min   Anion gap 13 5 - 15    Comment: Performed at [facility], [place], [place] [address]  Troponin I (High Sensitivity)     Status: None   Collection Time: 11/18/19  9:19 AM  Result Value Ref Range   Troponin I (High Sensitivity) 7 <18 ng/L     Comment: (NOTE) Elevated high sensitivity troponin I (hsTnI) values and significant  changes across serial measurements may suggest ACS but many other  chronic and acute conditions are known to elevate hsTnI results.  Refer to the "Links" section for chest pain algorithms and additional  guidance. Performed at [facility], [address] [place], [place] [address]   Brain natriuretic peptide     Status: Abnormal   Collection Time: 11/18/19  9:19 AM  Result Value Ref Range   B Natriuretic Peptide 246.5 (H) 0.0 - 100.0 pg/mL    Comment: Performed at [facility], [address] [place], [place] [address]  TSH     Status: None   Collection Time: 11/18/19  9:19 AM  Result Value Ref Range   TSH 2.540 0.350 - 4.500 uIU/mL    Comment: Performed by a 3rd Generation assay with a functional sensitivity of <=0.01 uIU/mL. Performed at [facility], [address] [place], [place] [address]   T4, free     Status: Abnormal   Collection Time: 11/18/19  9:19 AM  Result Value Ref Range   Free T4 1.24 (H) 0.61 - 1.12 ng/dL    Comment: (NOTE) Biotin ingestion may interfere with free T4 tests. If the results are inconsistent with the TSH level, previous test results, or the clinical presentation, then consider biotin interference. If needed, order repeat testing after stopping biotin. Performed at [facility], [address] [place],  [address]   Magnesium     Status: None   Collection Time: 11/18/19  9:19 AM  Result Value Ref Range   Magnesium 2.1 1.7 - 2.4 mg/dL    Comment: Performed  at [facility], [address] [place], [place] [address]  Ethanol     Status: None   Collection Time: 11/18/19  9:19 AM  Result Value Ref Range   Alcohol, Ethyl (B) <10 <10 mg/dL    Comment: (NOTE) Lowest detectable limit for serum alcohol is 10 mg/dL.  For medical purposes only. Performed at [facility], [address]  [place], [place] [address]   CK     Status: None   Collection Time: 11/18/19  9:19 AM  Result Value Ref Range   Total CK 66 38.0 - 234.0 U/L    Comment: Performed at [facility], [place], [place] [address]  Troponin I (High Sensitivity)     Status: None   Collection Time: 11/18/19  1:06 PM  Result Value Ref Range   Troponin I (High Sensitivity) 7 <18 ng/L    Comment: (NOTE) Elevated high sensitivity troponin I (hsTnI) values and significant  changes across serial measurements may suggest ACS but many other  chronic and acute conditions are known to elevate hsTnI results.  Refer to the "Links" section for chest pain algorithms and additional  guidance. Performed at [facility], [address] [place], [place] [address]   Urinalysis, Complete w Microscopic     Status: Abnormal   Collection Time: 11/18/19  3:33 PM  Result Value Ref Range   Color, Urine AMBER (A) YELLOW    Comment: BIOCHEMICALS MAY BE AFFECTED BY COLOR   APPearance CLOUDY (A) CLEAR   Specific Gravity, Urine 1.019 1.005 - 1.030   pH 6.0 5.0 - 8.0   Glucose, UA NEGATIVE NEGATIVE mg/dL   Hgb urine dipstick LARGE (A) NEGATIVE   Bilirubin Urine NEGATIVE NEGATIVE   Ketones, ur NEGATIVE NEGATIVE mg/dL   Protein, ur 100 (A) NEGATIVE mg/dL   Nitrite NEGATIVE NEGATIVE   Leukocytes,Ua SMALL (A) NEGATIVE   RBC / HPF >50 (H) 0 - 5 RBC/hpf   WBC, UA >50 (H) 0 - 5 WBC/hpf   Bacteria, UA MANY (A) NONE SEEN   Squamous Epithelial / LPF 0-5 0 - 5   Mucus PRESENT     Comment: Performed at [facility], [address], [place], [place] [address]  Urine culture     Status: Abnormal   Collection Time: 11/18/19  3:33 PM   Specimen: Urine, Clean Catch  Result Value Ref Range   Specimen Description      URINE, CLEAN CATCH Performed at [facility], [address] [place], [place] [address]    Special Requests      NONE Performed at [facility], [address] [place], [place] [address]    Culture MULTIPLE SPECIES PRESENT, SUGGEST RECOLLECTION (A)    Report Status 11/19/2019 FINAL   SARS Coronavirus 2 by RT PCR (hospital order, performed in [place] hospital lab) Nasopharyngeal Nasopharyngeal Swab     Status: None   Collection Time: 11/18/19  5:59 PM   Specimen: Nasopharyngeal Swab  Result Value Ref Range   SARS Coronavirus 2 NEGATIVE NEGATIVE    Comment: (NOTE) SARS-CoV-2 target nucleic acids are NOT DETECTED.  The SARS-CoV-2 RNA is generally detectable in upper and lower respiratory specimens during the acute phase of infection. The lowest concentration of SARS-CoV-2 viral copies this assay can detect is 250 copies / mL. A negative result does not preclude SARS-CoV-2 infection and should not be used as the sole basis for treatment or other patient management decisions.  A negative result may occur with improper specimen collection / handling, submission of specimen other than nasopharyngeal swab, presence of viral mutation(s) within the areas targeted by this assay, and inadequate number of viral copies (<250 copies / mL). A negative result must be combined with clinical observations, patient history, and epidemiological information.  Fact Sheet for Patients:   StrictlyIdeas.no  Fact Sheet for Healthcare Providers: BankingDealers.co.za  This test is not yet approved or  cleared by the [place] FDA and has been authorized for detection and/or diagnosis of SARS-CoV-2 by FDA under an Emergency Use Authorization (EUA).  This EUA will remain in effect (meaning this test can be used) for the duration of the COVID-19 declaration under Section 564(b)(1) of the Act, 21 U.S.C. section 360bbb-3(b)(1), unless the authorization is terminated or revoked sooner.  Performed at [facility], [address] [place], [place] [address]   HIV Antibody (routine testing w rflx)     Status:  None   Collection Time: 11/18/19  9:06 PM  Result Value Ref Range   HIV Screen 4th Generation wRfx Non Reactive Non Reactive    Comment: Performed at [facility], [place] [address], [place], [place] [address]  TSH     Status: None   Collection Time: 11/18/19  9:06 PM  Result Value Ref Range   TSH 1.372 0.350 - 4.500 uIU/mL    Comment: Performed by a 3rd Generation assay with a functional sensitivity of <=0.01 uIU/mL. Performed at [facility], [address] [place], [place] [address]   Hemoglobin A1c     Status: Abnormal   Collection Time: 11/18/19  9:06 PM  Result Value Ref Range   Hgb A1c MFr Bld 6.2 (H) 4.8 - 5.6 %    Comment: (NOTE) Pre diabetes:          5.7%-6.4%  Diabetes:              >6.4%  Glycemic control for   <7.0% adults with diabetes    Mean Plasma Glucose 131.24 mg/dL    Comment: Performed at [facility] [address] [place], [place] [address]  Glucose, capillary     Status: Abnormal   Collection Time: 11/18/19  9:19 PM  Result Value Ref Range   Glucose-Capillary 140 (H) 70 - 99 mg/dL    Comment: Glucose reference range applies only to samples taken after fasting for at least 8 hours.   Comment 1 Notify RN   CBC     Status: Abnormal   Collection Time: 11/19/19  4:18 AM  Result Value Ref Range   WBC 7.9 4.0 - 10.5 K/uL   RBC 3.91 3.87 - 5.11 MIL/uL   Hemoglobin 10.8 (L) 12.0 - 15.0 g/dL   HCT 34.8 (L) 36 - 46 %   MCV 89.0 80.0 - 100.0 fL   MCH 27.6 26.0 - 34.0 pg   MCHC 31.0 30.0 - 36.0 g/dL   RDW 15.3 11.5 - 15.5 %   Platelets 256 150 - 400 K/uL   nRBC 0.0 0.0 - 0.2 %    Comment: Performed at [facility], [address] [place],  [address]  Basic metabolic panel     Status: Abnormal   Collection Time: 11/19/19  4:18 AM  Result Value Ref Range   Sodium 137 135 - 145 mmol/L   Potassium 3.8 3.5 - 5.1 mmol/L    Comment: HEMOLYSIS AT THIS LEVEL MAY AFFECT RESULT   Chloride 101 98 -  111 mmol/L   CO2 29 22 -  32 mmol/L   Glucose, Bld 139 (H) 70 - 99 mg/dL    Comment: Glucose reference range applies only to samples taken after fasting for at least 8 hours.   BUN 19 8 - 23 mg/dL   Creatinine, Ser 1.10 (H) 0.44 - 1.00 mg/dL   Calcium 7.9 (L) 8.9 - 10.3 mg/dL   GFR calc non Af Amer 51 (L) >60 mL/min   GFR calc Af Amer 59 (L) >60 mL/min   Anion gap 7 5 - 15    Comment: Performed at [facility], [address] [place], [place] [address]  Glucose, capillary     Status: Abnormal   Collection Time: 11/19/19  7:33 AM  Result Value Ref Range   Glucose-Capillary 135 (H) 70 - 99 mg/dL    Comment: Glucose reference range applies only to samples taken after fasting for at least 8 hours.  Glucose, capillary     Status: Abnormal   Collection Time: 11/19/19 12:09 PM  Result Value Ref Range   Glucose-Capillary 163 (H) 70 - 99 mg/dL    Comment: Glucose reference range applies only to samples taken after fasting for at least 8 hours.    Imaging DG Tibia/Fibula Right  Result Date: 11/18/2019 CLINICAL DATA:  Status post fall. Right lower leg pain. Initial encounter. EXAM: RIGHT TIBIA AND FIBULA - 2 VIEW COMPARISON:  None. FINDINGS: No acute bony or joint abnormality. Severe knee and talonavicular osteoarthritis noted. Soft tissues are unremarkable. IMPRESSION: No acute abnormality. Electronically Signed   By: Inge Rise M.D.   On: 11/18/2019 10:16   US Pelvis Complete  Result Date: 11/18/2019 CLINICAL DATA:  Postmenopausal patient with vaginal bleeding. EXAM: TRANSABDOMINAL ULTRASOUND OF PELVIS TECHNIQUE: Transabdominal ultrasound examination of the pelvis was performed including evaluation of the uterus, ovaries, adnexal regions, and pelvic cul-de-sac. COMPARISON:  None. FINDINGS: Uterus Measurements: 11.5 x 6.2 x 7.2 cm = volume: 270 mL. No fibroids or other mass visualized. Endometrium Thickness: 3.5 cm.  No focal abnormality visualized. Right ovary Measurements: Not visualized. Left ovary  Measurements: Not visualized. Other findings: Imaging of the urinary bladder demonstrates an echogenic lesion within the bladder measuring 4.3 x 2.8 x 6.5 cm. There is flow within the lesion on Doppler imaging. IMPRESSION: Endometrial thickness is considered abnormal for a post-menopausal female. Endometrial sampling should be considered to exclude carcinoma. Lesion within the urinary bladder with flow on Doppler imaging worrisome for neoplasm. Recommend consult with urology. Electronically Signed   By: Inge Rise M.D.   On: 11/18/2019 14:55   DG Pelvis Portable  Result Date: 11/18/2019 CLINICAL DATA:  Status post fall.  Pain.  Initial encounter. EXAM: PORTABLE PELVIS 1-2 VIEWS COMPARISON:  None. FINDINGS: There is no evidence of pelvic fracture or diastasis. No pelvic bone lesions are seen. IMPRESSION: Negative exam. Electronically Signed   By: Inge Rise M.D.   On: 11/18/2019 10:16   DG Chest Portable 1 View  Result Date: 11/18/2019 CLINICAL DATA:  Status post fall today. EXAM: PORTABLE CHEST 1 VIEW COMPARISON:  None. FINDINGS: The lungs are clear. Heart size is upper normal. No pneumothorax or pleural effusion. No acute or focal bony abnormality. IMPRESSION: No acute disease. Electronically Signed   By: Inge Rise M.D.   On: 11/18/2019 10:12   DG Knee Complete 4 Views Left  Result Date: 11/18/2019 CLINICAL DATA:  Status post fall. Right knee pain. Initial encounter. EXAM: LEFT KNEE - COMPLETE 4+ VIEW COMPARISON:  None. FINDINGS: There is no acute bony or joint abnormality. Severe tricompartmental osteoarthritis noted. No joint effusion. IMPRESSION: No acute finding. Severe tricompartmental osteoarthritis. Electronically Signed   By: Inge Rise M.D.   On: 11/18/2019 10:15   CT HEMATURIA WORKUP  Result Date: 11/19/2019 CLINICAL DATA:  Hematuria.  Weakness. EXAM: CT ABDOMEN AND PELVIS WITHOUT AND WITH CONTRAST TECHNIQUE: Multidetector CT imaging of the abdomen and pelvis was  performed following the standard protocol before and following the bolus administration of intravenous contrast. CONTRAST:  158mL OMNIPAQUE IOHEXOL 300 MG/ML  SOLN COMPARISON:  Pelvic ultrasound obtained yesterday. FINDINGS: Lower chest: Enlarged heart. Minimal pericardial effusion with a maximum thickness of 11 mm. Minimal bibasilar atelectasis or scarring. Hepatobiliary: 2 tiny gallstones in the gallbladder and ill-defined soft tissue density in the dependent portion of the gallbladder. No gallbladder wall thickening or pericholecystic fluid. Unremarkable liver. Pancreas: Unremarkable. No pancreatic ductal dilatation or surrounding inflammatory changes. Spleen: Normal in size without focal abnormality. Adrenals/Urinary Tract: Rounded fat density left adrenal mass measuring 2.0 cm on image number 23 series 2. Normal appearing right adrenal gland. Mild-to-moderate dilatation of the left renal collecting system and ureter to the level of the ureterovesical junction. At that location, there is an irregular, rounded mass in the bladder measuring 3.7 x 3.7 cm on image number 78 series 2 with a small anterior calcification. This mass measures 4.0 cm in length on sagittal image number 75. To the right of midline in the dependent portion of the bladder, there is a 2nd mass measures 3.1 x 1.8 x 1.7 cm on sagittal image number 67 and axial image number 78 series 2. 3 mm right renal calculus and 1.4 cm rounded, medium density mass arising from the lateral aspect of the midportion of the right kidney, measured on image number 28 series 2. Normal appearing right ureter. Stomach/Bowel: Stomach is within normal limits. Appendix appears normal. No evidence of bowel wall thickening, distention, or inflammatory changes. Vascular/Lymphatic: Atheromatous arterial calcifications without aneurysm. No enlarged lymph nodes. Reproductive: Heterogeneous endometrial mass measuring 4.5 x 4.0 cm on image number 60 series 2 and 6.3 cm in length  on sagittal image number 80. No adnexal mass seen. Other: Small amount of subcutaneous air anteriorly on the right, compatible with recent subcutaneous injection. There is also bilateral subcutaneous edema at the level of the lower abdomen, greater on the left. Musculoskeletal: Lumbar and lower thoracic spine degenerative changes. IMPRESSION: 1. 4.0 x 3.7 x 3.7 cm irregular, rounded mass in the bladder on the left, compatible with a primary bladder carcinoma. This is obstructing the distal left ureter causing mild to moderate left hydronephrosis and hydroureter. 2. 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 3. 3 mm nonobstructing right renal calculus and 1.4 cm right renal mass or proteinaceous cyst. 4. 6.3 x 4.5 x 4.0 cm heterogeneous endometrial mass. 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 5. Calcified cholelithiasis and possible noncalcified cholelithiasis or sludge in the gallbladder 6. Minimal pericardial effusion. 7. Cardiomegaly. 8. 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 Electronically Signed   By: Claudie Revering M.D.   On: 11/19/2019 14:21   Comparison Sagittal CT and Ultrasound of the Uterus      Assessment: 69 y.o. female with bladder mass and thickened endometrium  Plan:  1) Thickened endometrium vs endometrial mass - CT limited by lack of contrast.  Given that pelvic ultrasound is the better and accepted imaging  modality for imaging the endometrium, and this failed to demonstrate a mass, and upon review of both imaging studies by myself,  favor thickened endometrium although an underlying endometrial polyp is not excluded. The role of unopposed estrogen in the development of endometrial hyperplasia or carcinoma is discussed.  The risk of endometrial hyperplasia is linearly correlated with increasing BMI given the production of estrone by adipose tissue.  - would recommend outpatient endometrial biopsy - obtain  pap smear outpatient - will arrange for follow up sometime this next week outpatient once cleared for discharge.  - Should there be concern for endometrial carcinoma will initiate referral to Gynecology Oncology.  Malachy Mood, MD, [facility], [facility] 11/19/2019, 3:21 PM

## 2019-11-19 NOTE — Progress Notes (Signed)
[place] [facility] [place] NAME: Brittney Shepard    MR#:  [id number]  DATE OF BIRTH:  01/05/1951  SUBJECTIVE:  patient working with physical therapy currently out in the chair. She came in with generalized weakness had stumbled out of her bed and fell. Did not lose consciousness. She has noticed some change in the color for urine. Denies any vaginally bleeding that she can recall. Denies any abdominal pain. No nausea vomiting or fever  continues to have dark-colored urine. No weight loss  REVIEW OF SYSTEMS:   Review of Systems  Constitutional: Negative for chills, fever and weight loss.  HENT: Negative for ear discharge, ear pain and nosebleeds.   Eyes: Negative for blurred vision, pain and discharge.  Respiratory: Negative for sputum production, shortness of breath, wheezing and stridor.   Cardiovascular: Negative for chest pain, palpitations, orthopnea and PND.  Gastrointestinal: Negative for abdominal pain, diarrhea, nausea and vomiting.  Genitourinary: Positive for hematuria. Negative for frequency and urgency.  Musculoskeletal: Positive for falls. Negative for back pain and joint pain.  Neurological: Positive for weakness. Negative for sensory change, speech change and focal weakness.  Psychiatric/Behavioral: Negative for depression and hallucinations. The patient is not nervous/anxious.    Tolerating Diet:NPO at present Tolerating PT: yes--rec rehab  DRUG ALLERGIES:   Allergies  Allergen Reactions  . Penicillin G Hives  . Sulfa Antibiotics Rash    VITALS:  Blood pressure 120/79, pulse 75, temperature 97.9 F (36.6 C), temperature source Oral, resp. rate 16, height 5\' 5"  (1.651 m), weight 132.8 kg, SpO2 97 %.  PHYSICAL EXAMINATION:   Physical Exam  GENERAL:  69 y.o.-year-old patient lying in the bed with no acute distress. Obese-morbidly EYES: Pupils equal, round, reactive to light and accommodation. No scleral icterus.   HEENT:  Head atraumatic, normocephalic. Oropharynx and nasopharynx clear.  NECK:  Supple, no jugular venous distention. No thyroid enlargement, no tenderness.  LUNGS: Normal breath sounds bilaterally, no wheezing, rales, rhonchi. No use of accessory muscles of respiration.  CARDIOVASCULAR: S1, S2 normal. No murmurs, rubs, or gallops.  ABDOMEN: Soft, nontender, nondistended. Bowel sounds present. No organomegaly or mass. Abdominal obesity+ EXTREMITIES: No cyanosis, clubbing or edema b/l.   Right tibial shin blister -POA NEUROLOGIC: Cranial nerves II through XII are intact. No focal Motor or sensory deficits b/l.  weak PSYCHIATRIC:  patient is alert and oriented x 3.  SKIN: No obvious rash, lesion, or ulcer.   LABORATORY PANEL:  CBC Recent Labs  Lab 11/19/19 0418  WBC 7.9  HGB 10.8*  HCT 34.8*  PLT 256    Chemistries  Recent Labs  Lab 11/18/19 0919 11/18/19 0919 11/19/19 0418  NA 137   < > 137  K 4.2   < > 3.8  CL 98   < > 101  CO2 26   < > 29  GLUCOSE 207*   < > 139*  BUN 20   < > 19  CREATININE 1.27*   < > 1.10*  CALCIUM 8.6*   < > 7.9*  MG 2.1  --   --   AST 23  --   --   ALT 12  --   --   ALKPHOS 78  --   --   BILITOT 0.7  --   --    < > = values in this interval not displayed.   Cardiac Enzymes No results for input(s): TROPONINI in the last 168 hours. RADIOLOGY:  DG Tibia/Fibula Right  Result Date: 11/18/2019 CLINICAL DATA:  Status post fall. Right lower leg pain. Initial encounter. EXAM: RIGHT TIBIA AND FIBULA - 2 VIEW COMPARISON:  None. FINDINGS: No acute bony or joint abnormality. Severe knee and talonavicular osteoarthritis noted. Soft tissues are unremarkable. IMPRESSION: No acute abnormality. Electronically Signed   By: Inge Rise M.D.   On: 11/18/2019 10:16   US Pelvis Complete  Result Date: 11/18/2019 CLINICAL DATA:  Postmenopausal patient with vaginal bleeding. EXAM: TRANSABDOMINAL ULTRASOUND OF PELVIS TECHNIQUE: Transabdominal ultrasound examination of  the pelvis was performed including evaluation of the uterus, ovaries, adnexal regions, and pelvic cul-de-sac. COMPARISON:  None. FINDINGS: Uterus Measurements: 11.5 x 6.2 x 7.2 cm = volume: 270 mL. No fibroids or other mass visualized. Endometrium Thickness: 3.5 cm.  No focal abnormality visualized. Right ovary Measurements: Not visualized. Left ovary Measurements: Not visualized. Other findings: Imaging of the urinary bladder demonstrates an echogenic lesion within the bladder measuring 4.3 x 2.8 x 6.5 cm. There is flow within the lesion on Doppler imaging. IMPRESSION: Endometrial thickness is considered abnormal for a post-menopausal female. Endometrial sampling should be considered to exclude carcinoma. Lesion within the urinary bladder with flow on Doppler imaging worrisome for neoplasm. Recommend consult with urology. Electronically Signed   By: Inge Rise M.D.   On: 11/18/2019 14:55   DG Pelvis Portable  Result Date: 11/18/2019 CLINICAL DATA:  Status post fall.  Pain.  Initial encounter. EXAM: PORTABLE PELVIS 1-2 VIEWS COMPARISON:  None. FINDINGS: There is no evidence of pelvic fracture or diastasis. No pelvic bone lesions are seen. IMPRESSION: Negative exam. Electronically Signed   By: Inge Rise M.D.   On: 11/18/2019 10:16   DG Chest Portable 1 View  Result Date: 11/18/2019 CLINICAL DATA:  Status post fall today. EXAM: PORTABLE CHEST 1 VIEW COMPARISON:  None. FINDINGS: The lungs are clear. Heart size is upper normal. No pneumothorax or pleural effusion. No acute or focal bony abnormality. IMPRESSION: No acute disease. Electronically Signed   By: Inge Rise M.D.   On: 11/18/2019 10:12   DG Knee Complete 4 Views Left  Result Date: 11/18/2019 CLINICAL DATA:  Status post fall. Right knee pain. Initial encounter. EXAM: LEFT KNEE - COMPLETE 4+ VIEW COMPARISON:  None. FINDINGS: There is no acute bony or joint abnormality. Severe tricompartmental osteoarthritis noted. No joint effusion.  IMPRESSION: No acute finding. Severe tricompartmental osteoarthritis. Electronically Signed   By: Inge Rise M.D.   On: 11/18/2019 10:15   ASSESSMENT AND PLAN:   Brittney Shepard is a 69 y.o. female with medical history significant of diabetes mellitus, hypertension and prior stroke without any significant residual deficit was brought to ED after having a fall. Apparently patient fell while sleeping.  She took a turn and fell down from bed around 2:30 AM.  She remained on the floor till 7 AM when she heard her neighbor and called him for help.  A. fib with RVR.(h/o Afib in old records in 2017) -Rate improved with IV Cardizem.   -Patient was on Cardizem and Tenormin at home and never took her medications--believe for HTN - pt not aware of any prior history of abnormal rhythm or A. Fib.  She was admitted in 2017 with altered mental status and A. fib was mentioned in discharge.  Prior EKG done in 2017 with A. Fib.  On discharge summary she was never started on anticoagulation due to concern of microcytic anemia. -Troponin negative and BNP mildly elevated at 246. -CHA2DS2-VASc score of 4. -Restart home  dose of Cardizem and atenolol. -Check TSH. -Cardiology consult with dr Burt Knack [facility] -Urology recommends holding any anticoagulation due to hematuria w/u  Hematuria/?UTI/Bladder mass/lesion  UA with hematuria and pyuria.  History of recurrent urinary infections.  No prior culture found.  She does has some pyuria although remained afebrile and mild leukocytosis which can also be explained with fall. -IV Ceftriaxone for now -Follow-up urine cultures. -US pelvis shows new bladder lesion--d/w dr Diamantina Providence and CT heamturia w/u has been ordered  Falls.  Patient has 2 falls recently while sleeping and rolling over from bed.  No history of leg weakness.  She uses walker to walk around. -PT/OT evaluation--recommends rehab   Increased endometrial thickening.  Found on pelvic ultrasound.  Denies any  post menopausal bleeding. -Needs gynecologic evaluation as an outpatient  Hypertension. -Continue home dose of atenolol and Cardizem.  Chronic lower extremity edema.  Echocardiogram done in 1324 with diastolic dysfunction. -Continue home dose of Lasix. -Repeat echocardiogram  Type 2 diabetes.  Was on Metformin at home. - A1c 6.2 -SSI  Morbid obesity. Body mass index is 41.6 kg/m.  DVT prophylaxis: Lovenox Code Status: Full code Family Communication: No family at bedside Disposition Plan: Home Consults called: Cardiology, urolgoy Admission status: observation  patient needs further workup for her bladder mass/hematuria. Urology consultation placed. Stat CT is being done. Further recommendations by urology after reviewing CT scan. Cont IV abxs for presumed UTI Cardiology consulted for AFib--pending  TOTAL TIME TAKING CARE OF THIS PATIENT: 25 minutes.  >50% time spent on counselling and coordination of care  Note: This dictation was prepared with Dragon dictation along with smaller phrase technology. Any transcriptional errors that result from this process are unintentional.  Fritzi Mandes M.D    [facility]   CC: Primary care physician; Patient, No Pcp PerPatient ID: Kerrin Mo, female   DOB: 1951-04-01, 69 y.o.   MRN: [id number]

## 2019-11-19 NOTE — TOC Initial Note (Signed)
Transition of Care [facility]) - Initial/Assessment Note    Patient Details  Name: Brittney Shepard MRN: [id number] Date of Birth: 07-Nov-1950  Transition of Care [facility]) CM/SW Contact:    Harriet Masson, RN Phone [place] 11/19/2019, 4:50 PM  Clinical Narrative:      [facility] RN spoke with pt today concerning possible placement for recommended PT for SNF (7/18). RN spoke with pt who does not have a preference however declined placement for [facility].  FL2 completed and sent out. No offered at this time. PASSR # [id number].  TOC team will continue to follow up.             Expected Discharge Plan: Skilled Nursing Facility Barriers to Discharge: Continued Medical Work up   Patient Goals and CMS Choice Patient states their goals for this hospitalization and ongoing recovery are:: Get better   Choice offered to / list presented to : Patient  Expected Discharge Plan and Services Expected Discharge Plan: [place] In-house Referral: Clinical Social Work Discharge Planning Services: CM Consult Post Acute Care Choice: [place] Living arrangements for the past 2 months: [place]                                      Prior Living Arrangements/Services Living arrangements for the past 2 months: Single Family Home Lives with:: Spouse Patient language and need for interpreter reviewed:: Yes Do you feel safe going back to the place where you live?: No      Need for Family Participation in Patient Care: Yes (Comment) Care giver support system in place?: Yes (comment)   Criminal Activity/Legal Involvement Pertinent to Current Situation/Hospitalization: No - Comment as needed  Activities of Daily Living Home Assistive Devices/Equipment: Walker (specify type) ADL Screening (condition at time of admission) Patient's cognitive ability adequate to safely complete daily activities?: Yes Is the patient deaf or have difficulty hearing?:  No Does the patient have difficulty seeing, even when wearing glasses/contacts?: No Does the patient have difficulty concentrating, remembering, or making decisions?: No Patient able to express need for assistance with ADLs?: Yes Does the patient have difficulty dressing or bathing?: Yes Independently performs ADLs?: No Communication: Independent Dressing (OT): Needs assistance Is this a change from baseline?: Change from baseline, expected to last <3days Grooming: Needs assistance Is this a change from baseline?: Change from baseline, expected to last <3 days Feeding: Independent Bathing: Needs assistance Is this a change from baseline?: Change from baseline, expected to last <3 days Toileting: Needs assistance Is this a change from baseline?: Change from baseline, expected to last <3 days In/Out Bed: Needs assistance Is this a change from baseline?: Change from baseline, expected to last <3 days Walks in Home: Needs assistance Is this a change from baseline?: Change from baseline, expected to last <3 days Does the patient have difficulty walking or climbing stairs?: Yes Weakness of Legs: Both Weakness of Arms/Hands: None  Permission Sought/Granted Permission sought to share information with : Case Manager Permission granted to share information with : Yes, Verbal Permission Granted  Share Information with NAME: Tashawn Greff     Permission granted to share info w Relationship: Son  Permission granted to share info w Contact Information: Malka So (son)  Emotional Assessment   Attitude/Demeanor/Rapport: Engaged Affect (typically observed): Accepting Orientation: : Oriented to Self, Oriented to Place, Oriented to  Time, Oriented to Situation   Psych  Involvement: No (comment)  Admission diagnosis:  Vaginal bleeding [N93.9] Bladder mass [N32.89] Atrial fibrillation with rapid ventricular response (HCC) [I48.91] Acute cystitis without hematuria [N30.00] Atrial fibrillation with  RVR ([place]) [I48.91] Fall, initial encounter [W19.XXXA] Atrial fibrillation [facility]) [I48.91] Patient Active Problem List   Diagnosis Date Noted  . Atrial fibrillation ([place]) 11/19/2019  . Hematuria, gross   . Postmenopausal bleeding   . Abnormal endometrial ultrasound   . Atrial fibrillation with RVR ([place]) 11/18/2019  . Bladder mass   . Fall   . Acute cystitis without hematuria   . DNR (do not resuscitate)   . Palliative care by specialist   . Acute respiratory failure with hypoxia and hypercapnia ([place])   . Atrial fibrillation with rapid ventricular response ([place]) 03/20/2016  . Chronic obstructive pulmonary disease with acute exacerbation [facility])    PCP:  Patient, No Pcp Per Pharmacy:   [facility], [address] [place] [address] Phone: [phone] Fax: [phone]     Social Determinants of Health (SDOH) Interventions    Readmission Risk Interventions No flowsheet data found.

## 2019-11-19 NOTE — NC FL2 (Signed)
[place] LEVEL OF CARE SCREENING TOOL     IDENTIFICATION  Patient Name: Brittney Shepard Birthdate: [birth year] Sex: female Admission Date (Current Location): 11/18/2019  [place] and [place] Number:  Engineering geologist and Address:  [facility], [address] [place], [place] [address]      Provider Number: [id number]  Attending Physician Name and Address:  Fritzi Mandes, MD  Relative Name and Phone Number:  Brighten Buzzelli [phone]    Current Level of Care: Hospital Recommended Level of Care: [place] Prior Approval Number:    Date Approved/Denied: 11/19/19 PASRR Number: [id number]  Discharge Plan: SNF    Current Diagnoses: Patient Active Problem List   Diagnosis Date Noted  . Atrial fibrillation ([place]) 11/19/2019  . Hematuria, gross   . Postmenopausal bleeding   . Abnormal endometrial ultrasound   . Atrial fibrillation with RVR ([place]) 11/18/2019  . Bladder mass   . Fall   . Acute cystitis without hematuria   . DNR (do not resuscitate)   . Palliative care by specialist   . Acute respiratory failure with hypoxia and hypercapnia ([place])   . Atrial fibrillation with rapid ventricular response ([place]) 03/20/2016  . Chronic obstructive pulmonary disease with acute exacerbation (HCC)     Orientation RESPIRATION BLADDER Height & Weight     Self, Time, Place, Situation  Normal Continent Weight: 132.8 kg Height:  5\' 5"  (165.1 cm)  BEHAVIORAL SYMPTOMS/MOOD NEUROLOGICAL BOWEL NUTRITION STATUS      Continent Diet  AMBULATORY STATUS COMMUNICATION OF NEEDS Skin   Limited Assist Verbally Normal                       Personal Care Assistance Level of Assistance  Bathing, Dressing, Feeding Bathing Assistance: Limited assistance Feeding assistance: Independent Dressing Assistance: Limited assistance     Functional Limitations Info  Sight, Hearing, Speech Sight Info: Adequate Hearing Info: Adequate Speech Info:  Adequate    SPECIAL CARE FACTORS FREQUENCY  PT (By licensed PT), OT (By licensed OT)     PT Frequency: X5 OT Frequency: X5            Contractures Contractures Info: Not present    Additional Factors Info  Allergies   Allergies Info: Penicillin, Sulfa           Current Medications (11/19/2019):  This is the current hospital active medication list Current Facility-Administered Medications  Medication Dose Route Frequency Provider Last Rate Last Admin  . acetaminophen (TYLENOL) tablet 650 mg  650 mg Oral Q6H PRN Lorella Nimrod, MD       Or  . acetaminophen (TYLENOL) suppository 650 mg  650 mg Rectal Q6H PRN Lorella Nimrod, MD      . atenolol (TENORMIN) tablet 50 mg  50 mg Oral Daily Lorella Nimrod, MD   50 mg at 11/19/19 0911  . cefTRIAXone (ROCEPHIN) 2 g in sodium chloride 0.9 % 100 mL IVPB  2 g Intravenous Q24H Lorella Nimrod, MD      . diltiazem (CARDIZEM SR) 12 hr capsule 60 mg  60 mg Oral Q12H Lorella Nimrod, MD   60 mg at 11/19/19 0911  . furosemide (LASIX) tablet 40 mg  40 mg Oral BID Lorella Nimrod, MD   40 mg at 11/19/19 0911  . insulin aspart (novoLOG) injection 0-15 Units  0-15 Units Subcutaneous TID WC Amin, Sumayya, MD      . insulin aspart (novoLOG) injection 0-5 Units  0-5 Units Subcutaneous  Connye Burkitt, MD      . ondansetron [facility]) tablet 4 mg  4 mg Oral Q6H PRN Lorella Nimrod, MD       Or  . ondansetron (ZOFRAN) injection 4 mg  4 mg Intravenous Q6H PRN Lorella Nimrod, MD      . polyethylene glycol (MIRALAX / GLYCOLAX) packet 17 g  17 g Oral Daily PRN Lorella Nimrod, MD      . potassium chloride (KLOR-CON) CR tablet 10 mEq  10 mEq Oral BID Lorella Nimrod, MD   10 mEq at 11/19/19 0912  . sodium chloride flush (NS) 0.9 % injection 3 mL  3 mL Intravenous Q12H Lorella Nimrod, MD   3 mL at 11/19/19 9622     Discharge Medications: Please see discharge summary for a list of discharge medications.  Relevant Imaging Results:  Relevant Lab Results:   Additional  Information [id number]  Harriet Masson, RN

## 2019-11-19 NOTE — Progress Notes (Signed)
Patient ID: Brittney Shepard, female   DOB: 05-26-1950, 69 y.o.   MRN: [id number]  CT hematuria w/u results noted Pt has 2 bladder masses and the mass on the left is causing left sided hydronephrosis--Dr Diamantina Providence plans to place stetn and remove bladder mass tomorrow Pt also has endometrial mass--spoke with Dr Georgianne Fick who will see pt and try to coordinate with Gyn tomorrow to see if the Gyn procedure can be coordiaated with Urology.

## 2019-11-20 ENCOUNTER — Encounter: Payer: Self-pay | Admitting: Internal Medicine

## 2019-11-20 ENCOUNTER — Inpatient Hospital Stay: Payer: Medicare Other

## 2019-11-20 ENCOUNTER — Encounter
Admission: EM | Disposition: A | Discharge status: Skilled Nursing Facility | Payer: Self-pay | Source: Home / Self Care | Attending: Internal Medicine

## 2019-11-20 ENCOUNTER — Inpatient Hospital Stay: Payer: Medicare Other | Admitting: Anesthesiology

## 2019-11-20 DIAGNOSIS — N329 Bladder disorder, unspecified: Secondary | ICD-10-CM

## 2019-11-20 DIAGNOSIS — D494 Neoplasm of unspecified behavior of bladder: Secondary | ICD-10-CM

## 2019-11-20 DIAGNOSIS — I4819 Other persistent atrial fibrillation: Secondary | ICD-10-CM

## 2019-11-20 DIAGNOSIS — N132 Hydronephrosis with renal and ureteral calculous obstruction: Secondary | ICD-10-CM

## 2019-11-20 DIAGNOSIS — N1330 Unspecified hydronephrosis: Secondary | ICD-10-CM

## 2019-11-20 HISTORY — PX: CYSTOSCOPY W/ RETROGRADES: SHX1426

## 2019-11-20 HISTORY — PX: CYSTOSCOPY WITH STENT PLACEMENT: SHX5790

## 2019-11-20 HISTORY — PX: TRANSURETHRAL RESECTION OF BLADDER TUMOR: SHX2575

## 2019-11-20 LAB — GLUCOSE, CAPILLARY
Glucose-Capillary: 134 mg/dL — ABNORMAL HIGH (ref 70–99)
Glucose-Capillary: 119 mg/dL — ABNORMAL HIGH (ref 70–99)
Glucose-Capillary: 135 mg/dL — ABNORMAL HIGH (ref 70–99)
Glucose-Capillary: 150 mg/dL — ABNORMAL HIGH (ref 70–99)
Glucose-Capillary: 292 mg/dL — ABNORMAL HIGH (ref 70–99)

## 2019-11-20 SURGERY — CYSTOSCOPY, WITH STENT INSERTION
Anesthesia: General

## 2019-11-20 MED ORDER — IOHEXOL 180 MG/ML  SOLN
INTRAMUSCULAR | Status: DC | PRN
  Administered 2019-11-20: 15 mL

## 2019-11-20 MED ORDER — CHLORHEXIDINE GLUCONATE CLOTH 2 % EX PADS
6.0000 | MEDICATED_PAD | Freq: Every day | CUTANEOUS | Status: DC
  Administered 2019-11-21 – 2019-11-22 (×3): 6 via TOPICAL

## 2019-11-20 MED ORDER — DEXAMETHASONE SODIUM PHOSPHATE 10 MG/ML IJ SOLN
INTRAMUSCULAR | Status: DC | PRN
  Administered 2019-11-20: 10 mg via INTRAVENOUS

## 2019-11-20 MED ORDER — FENTANYL CITRATE (PF) 100 MCG/2ML IJ SOLN
25.0000 ug | INTRAMUSCULAR | Status: DC | PRN
  Administered 2019-11-20: 25 ug via INTRAVENOUS

## 2019-11-20 MED ORDER — PHENYLEPHRINE HCL (PRESSORS) 10 MG/ML IV SOLN
INTRAVENOUS | Status: DC | PRN
  Administered 2019-11-20 (×2): 200 ug via INTRAVENOUS
  Administered 2019-11-20: 100 ug via INTRAVENOUS
  Administered 2019-11-20: 200 ug via INTRAVENOUS
  Administered 2019-11-20: 100 ug via INTRAVENOUS
  Administered 2019-11-20: 200 ug via INTRAVENOUS
  Administered 2019-11-20: 100 ug via INTRAVENOUS

## 2019-11-20 MED ORDER — LIDOCAINE HCL (PF) 2 % IJ SOLN
INTRAMUSCULAR | Status: AC
  Filled 2019-11-20: qty 5

## 2019-11-20 MED ORDER — DEXAMETHASONE SODIUM PHOSPHATE 10 MG/ML IJ SOLN
INTRAMUSCULAR | Status: AC
  Filled 2019-11-20: qty 1

## 2019-11-20 MED ORDER — SUCCINYLCHOLINE CHLORIDE 20 MG/ML IJ SOLN
INTRAMUSCULAR | Status: DC | PRN
  Administered 2019-11-20: 140 mg via INTRAVENOUS

## 2019-11-20 MED ORDER — ROCURONIUM BROMIDE 100 MG/10ML IV SOLN
INTRAVENOUS | Status: DC | PRN
  Administered 2019-11-20: 40 mg via INTRAVENOUS
  Administered 2019-11-20: 30 mg via INTRAVENOUS
  Administered 2019-11-20: 40 mg via INTRAVENOUS

## 2019-11-20 MED ORDER — FENTANYL CITRATE (PF) 100 MCG/2ML IJ SOLN
INTRAMUSCULAR | Status: AC
  Filled 2019-11-20: qty 2

## 2019-11-20 MED ORDER — FENTANYL CITRATE (PF) 100 MCG/2ML IJ SOLN: 25.0000 ug | INTRAMUSCULAR | Status: DC | PRN

## 2019-11-20 MED ORDER — FENTANYL CITRATE (PF) 100 MCG/2ML IJ SOLN
INTRAMUSCULAR | Status: DC | PRN
  Administered 2019-11-20: 100 ug via INTRAVENOUS

## 2019-11-20 MED ORDER — ROCURONIUM BROMIDE 10 MG/ML (PF) SYRINGE
PREFILLED_SYRINGE | INTRAVENOUS | Status: AC
  Filled 2019-11-20: qty 10

## 2019-11-20 MED ORDER — GADOBUTROL 1 MMOL/ML IV SOLN
10.0000 mL | Freq: Once | INTRAVENOUS | Status: AC | PRN
  Administered 2019-11-21: 10 mL via INTRAVENOUS

## 2019-11-20 MED ORDER — SEVOFLURANE IN SOLN
RESPIRATORY_TRACT | Status: AC
  Filled 2019-11-20: qty 250

## 2019-11-20 MED ORDER — SUGAMMADEX SODIUM 500 MG/5ML IV SOLN
INTRAVENOUS | Status: AC
  Filled 2019-11-20: qty 5

## 2019-11-20 MED ORDER — PROPOFOL 10 MG/ML IV BOLUS
INTRAVENOUS | Status: DC | PRN
  Administered 2019-11-20: 200 mg via INTRAVENOUS

## 2019-11-20 MED ORDER — FENTANYL CITRATE (PF) 100 MCG/2ML IJ SOLN
INTRAMUSCULAR | Status: AC
  Administered 2019-11-20: 25 ug via INTRAVENOUS
  Filled 2019-11-20: qty 2

## 2019-11-20 MED ORDER — ONDANSETRON HCL 4 MG/2ML IJ SOLN
INTRAMUSCULAR | Status: DC | PRN
  Administered 2019-11-20: 4 mg via INTRAVENOUS

## 2019-11-20 MED ORDER — NYSTATIN 100000 UNIT/GM EX POWD
Freq: Two times a day (BID) | CUTANEOUS | Status: DC
  Administered 2019-11-20: 1 via TOPICAL
  Filled 2019-11-20: qty 15

## 2019-11-20 MED ORDER — LIDOCAINE HCL (CARDIAC) PF 100 MG/5ML IV SOSY
PREFILLED_SYRINGE | INTRAVENOUS | Status: DC | PRN
  Administered 2019-11-20: 100 mg via INTRAVENOUS

## 2019-11-20 MED ORDER — ONDANSETRON HCL 4 MG/2ML IJ SOLN
INTRAMUSCULAR | Status: AC
  Filled 2019-11-20: qty 2

## 2019-11-20 MED ORDER — ONDANSETRON HCL 4 MG/2ML IJ SOLN: 4.0000 mg | Freq: Once | INTRAMUSCULAR | Status: DC | PRN

## 2019-11-20 MED ORDER — SUGAMMADEX SODIUM 500 MG/5ML IV SOLN
INTRAVENOUS | Status: DC | PRN
  Administered 2019-11-20: 531.2 mg via INTRAVENOUS

## 2019-11-20 MED ORDER — PROPOFOL 10 MG/ML IV BOLUS
INTRAVENOUS | Status: AC
  Filled 2019-11-20: qty 20

## 2019-11-20 SURGICAL SUPPLY — 40 items
ADAPTER IRRIG TUBE 2 SPIKE SOL (ADAPTER) ×4 IMPLANT
BAG DRAIN CYSTO-URO LG1000N (MISCELLANEOUS) ×4 IMPLANT
BAG URINE DRAIN 2000ML AR STRL (UROLOGICAL SUPPLIES) ×4 IMPLANT
BRUSH SCRUB EZ 1% IODOPHOR (MISCELLANEOUS) ×4 IMPLANT
CATH FOL 2WAY LX 18X30 (CATHETERS) IMPLANT
CATH FOL 2WAY LX 22X30 (CATHETERS) ×4 IMPLANT
CATH URETL 5X70 OPEN END (CATHETERS) ×4 IMPLANT
CONRAY 43 FOR UROLOGY 50M (MISCELLANEOUS) ×4 IMPLANT
DRAPE UTILITY 15X26 TOWEL STRL (DRAPES) ×4 IMPLANT
DRSG TELFA 4X3 1S NADH ST (GAUZE/BANDAGES/DRESSINGS) ×8 IMPLANT
ELECT BIVAP BIPO 22/24 DONUT (ELECTROSURGICAL) ×4
ELECT LOOP 22F BIPOLAR SML (ELECTROSURGICAL)
ELECT REM PT RETURN 9FT ADLT (ELECTROSURGICAL)
ELECTRD BIVAP BIPO 22/24 DONUT (ELECTROSURGICAL) ×2 IMPLANT
ELECTRODE LOOP 22F BIPOLAR SML (ELECTROSURGICAL) IMPLANT
ELECTRODE REM PT RTRN 9FT ADLT (ELECTROSURGICAL) IMPLANT
GLOVE BIOGEL PI IND STRL 7.5 (GLOVE) ×2 IMPLANT
GLOVE BIOGEL PI INDICATOR 7.5 (GLOVE) ×2
GOWN STRL REUS W/ TWL LRG LVL3 (GOWN DISPOSABLE) ×2 IMPLANT
GOWN STRL REUS W/ TWL XL LVL3 (GOWN DISPOSABLE) ×2 IMPLANT
GOWN STRL REUS W/TWL LRG LVL3 (GOWN DISPOSABLE) ×2
GOWN STRL REUS W/TWL XL LVL3 (GOWN DISPOSABLE) ×2
GUIDEWIRE STR DUAL SENSOR (WIRE) ×4 IMPLANT
HOLDER FOLEY CATH W/STRAP (MISCELLANEOUS) ×4 IMPLANT
KIT TURNOVER CYSTO (KITS) ×4 IMPLANT
LOOP CUT BIPOLAR 24F LRG (ELECTROSURGICAL) ×4 IMPLANT
NDL SAFETY ECLIPSE 18X1.5 (NEEDLE) IMPLANT
NEEDLE HYPO 18GX1.5 SHARP (NEEDLE)
PACK CYSTO AR (MISCELLANEOUS) ×4 IMPLANT
PAD ARMBOARD 7.5X6 YLW CONV (MISCELLANEOUS) ×4 IMPLANT
SET CYSTO W/LG BORE CLAMP LF (SET/KITS/TRAYS/PACK) IMPLANT
SET IRRIG Y TYPE TUR BLADDER L (SET/KITS/TRAYS/PACK) ×4 IMPLANT
SOL .9 NS 3000ML IRR  AL (IV SOLUTION) ×24
SOL .9 NS 3000ML IRR UROMATIC (IV SOLUTION) ×24 IMPLANT
STENT URET 6FRX24 CONTOUR (STENTS) IMPLANT
STENT URET 6FRX26 CONTOUR (STENTS) IMPLANT
STENT URO INLAY 6FRX26CM (STENTS) ×4 IMPLANT
SURGILUBE 2OZ TUBE FLIPTOP (MISCELLANEOUS) ×4 IMPLANT
SYRINGE IRR TOOMEY STRL 70CC (SYRINGE) ×4 IMPLANT
WATER STERILE IRR 1000ML POUR (IV SOLUTION) ×4 IMPLANT

## 2019-11-20 NOTE — Progress Notes (Addendum)
Urology Consult   I have been asked to see the patient by Dr. Posey Pronto, for evaluation and management of bladder mass and left hydronephrosis.  Chief Complaint: Bladder mass  HPI:  Brittney Shepard is a 69 y.o. year old female with a number of comorbidities including morbid obesity and BMI of 49 who presented to the ER this weekend after a fall.  She was worked up with a pelvic ultrasound that showed endometrial thickening as well as a likely bladder mass, which then prompted a CT that showed 2 medium sized bladder tumors, as well as a left hydroureteronephrosis down to the distal ureter.  A CT urogram was ordered, but for unclear reasons no contrast was administered and it was a noncontrast CT.  There was no clear evidence of metastatic disease.  Urinalysis in the ED was concerning for possible infection on 7/17 with many bacteria, greater than 50 RBCs, greater than 50 WBCs, however culture only showed mixed species.  She has been on ceftriaxone for greater than 48 hours.  She denies any smoking history, but she used to work in a [employer].  She has a family history of ovarian and kidney cancer.  She reports some gross hematuria over the last few weeks, but denies any left-sided flank pain.  She denies any UTI symptoms.  Urine is currently clear per pure wick.  She denies any weight loss.  Anticoagulation was recommended by cardiology for her atrial fibrillation, but she refused.   PMH: Past Medical History:  Diagnosis Date  . Diabetes mellitus without complication ([place])   . Hypertension   . Stroke [facility]) 2013    Allergies:  Allergies  Allergen Reactions  . Penicillin G Hives  . Sulfa Antibiotics Rash   Social History:  reports that she has never smoked. She has never used smokeless tobacco. She reports that she does not drink alcohol and does not use drugs.  ROS: Negative aside from those stated in the HPI.  Physical Exam: BP 111/67 (BP Location: Left Arm)   Pulse 94   Temp  97.8 F (36.6 C) (Oral)   Resp 20   Ht 5\' 5"  (1.651 m)   Wt 132.8 kg   SpO2 97%   BMI 48.72 kg/m    Constitutional:  Alert and oriented, No acute distress. Cardiovascular: Irregularly irregular Respiratory: Diminished breath sounds bilaterally GI: Abdomen is soft, nontender, nondistended, no abdominal masses GU: No CVA tenderness  Laboratory Data: Reviewed  Pertinent Imaging: I have personally reviewed the CT showing multiple medium sized bladder tumors as well as left hydroureteronephrosis. Possible 3cm liver mass, unclear etiology and will require further evaluation. There is also endometrial thickening.  CT limited by lack of contrast.  Assessment & Plan:   In summary, she is a co-morbid 69 year old female with atrial fibrillation not on anticoagulation, morbid obesity, and gross hematuria with 2 bladder tumor seen on CT, as well as suspected left hydroureteronephrosis secondary to tumor obstruction.  Urinalysis was concerning for possible infection but showed only mixed species, and she has been on ceftriaxone for greater than 48 hours.  She has had no fevers.  She had a subtle elevated WBC on admission, however this was likely secondary to dehydration based on the simultaneously elevated hematocrit. Possible 3cm liver mass, but not completely evaluated without contrast.  We reviewed her films at length today, and the need for left ureteral stent placement for drainage of the left kidney, and biopsy/TURBT of her bladder tumors. We discussed  transurethral resection of bladder tumor (TURBT) and risks and benefits at length. This is typically a 1 to 2-hour procedure done under general anesthesia in the operating room.  A scope is inserted through the urethra and used to resect abnormal tissue within the bladder, which is then sent to the pathologist to determine grade and stage of the tumor.  Risks include bleeding, infection, need for temporary Foley placement, and bladder perforation.   Treatment strategies are based on the type of tumor and depth of invasion.  We also discussed the need for left ureteral stent placement to maximize drainage of her left kidney.  We briefly reviewed the different treatment pathways for non-muscle invasive and muscle invasive bladder cancer.  She will additionally need a chest x-ray to complete staging, as well as likely a CT A/P with contrast  Finally, we discussed that if there is any evidence of purulence in the bladder or kidney with stent placement, would place stent and plan for TURBT in the future when infection treated.   I also discussed her case with Dr. Georgianne Fick of gynecology, and they are planning outpatient work-up for her endometrial thickening.  OR today for left ureteral stent placement, possible TURBT Will need further imaging [id number] liver mass and to complete staging. Will discuss with oncology but likely chest CT and CT A/P with contrast vs PET scan.  Billey Co, MD  Total time spent on the floor was 80 minutes, with greater than 50% spent in counseling and coordination of care with the patient regarding new diagnosis of suspected bladder tumors, left hydronephrosis, need for left ureteral stent placement and TURBT  [facility] [address], [place], [place] [address] [phone]

## 2019-11-20 NOTE — Progress Notes (Signed)
Tried to reach son by phone multiple times today, rings as busy. Will continue to try to update him re: bladder tumors and surgery today  Nickolas Madrid, MD 11/20/2019

## 2019-11-20 NOTE — Op Note (Signed)
Date of procedure: 11/20/19  Preoperative diagnosis:  1. Bladder tumor, > 5 cm 2. Left hydronephrosis  Postoperative diagnosis:  1. Same  Procedure: 1. Cystoscopy, left retrograde pyelogram with intraoperative interpretation, left ureteral stent placement 2. TURBT large, 6 cm  Surgeon: Nickolas Madrid, MD  Anesthesia: General  Complications: None  Intraoperative findings:  1.  Multiple papillary tumors within the bladder, largest approximately 6 cm in size at the left ureteral orifice and trigone 2.  Tumor completely resected down to bladder wall 3.  Able to identify left ureteral orifice and place left ureteral stent  EBL: 25 mL  Specimens:  1.  Bladder tumor superficial 2.  Bladder tumor deep  Drains: 22 French two-way Foley  Indication: Brittney Shepard is a 69 y.o. patient who presented to the ED after a fall and on pelvic ultrasound was found to have a large bladder mass.  This prompted a CT which showed multiple large bladder masses and left-sided hydronephrosis down to the level of the largest tumor in the bladder.  After reviewing the management options for treatment, they elected to proceed with the above surgical procedure(s). We have discussed the potential benefits and risks of the procedure, side effects of the proposed treatment, the likelihood of the patient achieving the goals of the procedure, and any potential problems that might occur during the procedure or recuperation. Informed consent has been obtained.  Description of procedure:  The patient was taken to the operating room and general anesthesia was induced. SCDs were placed for DVT prophylaxis. The patient was placed in the dorsal lithotomy position, prepped and draped in the usual sterile fashion, and preoperative antibiotics(ceftriaxone) were administered. A preoperative time-out was performed.   A 21 French rigid cystoscope was used to intubate the urethra and thorough cystoscopy was performed.  There  were 4 papillary tumors within the bladder(trigone and left lateral wall and dome), the largest measuring 6 cm in size at the left trigone and overlying where I anticipated the left ureteral orifice would be.  I was able to identify the right ureteral orifice, no tumor surrounded this.  There were no other tumors in the bladder.  The smaller tumors were carefully resected down to bladder wall and fulgurated using the bipolar resectoscope with the large loop.  I then methodically tackled the large 6 cm left trigone tumor and carefully resected this down towards the level of the bladder wall.  About halfway through, at the inferior medial edge of the tumor I was able to identify the left ureteral orifice.  This was cannulated with a sensor wire and advanced up to the kidney under fluoroscopic vision.  An access catheter was advanced over the wire up to the kidney and the wire removed, and there was a hydronephrotic drip of clear yellow urine.  Contrast was injected and opacified a collecting system with moderate hydronephrosis.  The wire was replaced, and a 6 [place] by 26 cm Bard Optima stent was placed under fluoroscopic vision.  There was an excellent curl in the midpole, as well as under direct vision the bladder.  I then changed back over to the resectoscope and continue to resect the large tumor down to the level of the bladder wall.  This specimen was sent as bladder tumor superficial.  I then took numerous swipes of muscle at the bladder wall, as well as to cold cup biopsies, and this was sent as bladder tumor deep.  The bipolar button was used to achieve meticulous hemostasis at  the base of the largest tumor, as well as the other small tumors in the bladder including the trigone, left lateral wall, and dome.    With the bladder completely decompressed there was no bleeding and no additional papillary tumor seen.  A 22 French two-way Foley was placed in the bladder with return of clear fluid.  10 cc were  placed in the balloon.  The catheter irrigated easily and remained clear.  Disposition: Stable to PACU  Plan: Return to general care, maintain Foley 1 week for bladder healing Follow-up pathology We'll discuss next staging imaging with oncology regarding possible liver lesion High concern for muscle invasion based on intraoperative findings and hydronephrosis  Nickolas Madrid, MD

## 2019-11-20 NOTE — Progress Notes (Signed)
PT Cancellation Note  Patient Details Name: Brittney Shepard MRN: [id number] DOB: 08/21/1950   Cancelled Treatment:    Reason Eval/Treat Not Completed: Other (comment). Pt currently off unit and unavailable for therapy at this time. Will re-attempt next available date.   Vondell Babers 11/20/2019, 1:11 PM Greggory Stallion, PT, DPT [phone]

## 2019-11-20 NOTE — TOC Progression Note (Signed)
Transition of Care [facility]) - Progression Note    Patient Details  Name: Brittney Shepard MRN: [id number] Date of Birth: May 30, 1950  Transition of [facility]) CM/SW [place], LCSW Phone Number: 11/20/2019, 4:16 PM  Clinical Narrative: Patient off unit. CSW printed off list of bed offers. RN will give to patient when she returns.    Expected Discharge Plan: [place] Barriers to Discharge: Continued Medical Work up  Expected Discharge Plan and Services Expected Discharge Plan: [place] In-house Referral: Clinical Social Work Discharge Planning Services: CM Consult Post Acute Care Choice: [place] arrangements for the past 2 months: Single Family Home                                       Social Determinants of Health (SDOH) Interventions    Readmission Risk Interventions No flowsheet data found.

## 2019-11-20 NOTE — Anesthesia Postprocedure Evaluation (Signed)
Anesthesia Post Note  Patient: Tymesha Ditmore  Procedure(s) Performed: CYSTOSCOPY WITH STENT PLACEMENT (Left ) TRANSURETHRAL RESECTION OF BLADDER TUMOR (TURBT) (N/A ) CYSTOSCOPY WITH RETROGRADE PYELOGRAM (N/A )  Patient location during evaluation: PACU Anesthesia Type: General Level of consciousness: awake and alert Pain management: pain level controlled Vital Signs Assessment: post-procedure vital signs reviewed and stable Respiratory status: spontaneous breathing, nonlabored ventilation, respiratory function stable and patient connected to nasal cannula oxygen Cardiovascular status: blood pressure returned to baseline and stable Postop Assessment: no apparent nausea or vomiting Anesthetic complications: no   No complications documented.   Last Vitals:  Vitals:   11/20/19 1641 11/20/19 1656  BP: 133/72 (!) 110/59  Pulse: (!) 103 91  Resp: 16 18  Temp:    SpO2: 97% 98%    Last Pain:  Vitals:   11/20/19 1656  TempSrc:   PainSc: Preston Betta Balla

## 2019-11-20 NOTE — Anesthesia Procedure Notes (Signed)
Procedure Name: Intubation Date/Time: 11/20/2019 1:31 PM Performed by: Doreen Salvage, CRNA Pre-anesthesia Checklist: Patient identified, Patient being monitored, Timeout performed, Emergency Drugs available and Suction available Patient Re-evaluated:Patient Re-evaluated prior to induction Oxygen Delivery Method: Circle system utilized Preoxygenation: Pre-oxygenation with 100% oxygen Induction Type: IV induction Ventilation: Mask ventilation without difficulty Laryngoscope Size: Mac, 3 and McGraph Grade View: Grade I Tube type: Oral Tube size: 7.0 mm Number of attempts: 1 Airway Equipment and Method: Stylet Placement Confirmation: ETT inserted through vocal cords under direct vision,  positive ETCO2 and breath sounds checked- equal and bilateral Secured at: 19 cm Tube secured with: Tape Dental Injury: Teeth and Oropharynx as per pre-operative assessment

## 2019-11-20 NOTE — Transfer of Care (Signed)
Immediate Anesthesia Transfer of Care Note  Patient: Cletus Mehlhoff  Procedure(s) Performed: Procedure(s): CYSTOSCOPY WITH STENT PLACEMENT (Left) TRANSURETHRAL RESECTION OF BLADDER TUMOR (TURBT) (N/A) CYSTOSCOPY WITH RETROGRADE PYELOGRAM (N/A)  Patient Location: PACU  Anesthesia Type:General  Level of Consciousness: sedated  Airway & Oxygen Therapy: Patient Spontanous Breathing and Patient connected to face mask oxygen  Post-op Assessment: Report given to RN and Post -op Vital signs reviewed and stable  Post vital signs: Reviewed and stable  Last Vitals:  Vitals:   11/20/19 1225 11/20/19 1541  BP: 132/72 (!) 130/102  Pulse: (!) 51   Resp: 18 20  Temp:  (!) 35.8 C  SpO2: 61% 44%    Complications: No apparent anesthesia complications

## 2019-11-20 NOTE — Progress Notes (Signed)
Progress Note  Patient Name: Brittney Shepard Date of Encounter: 11/20/2019  Primary Cardiologist: [facility], Dr. Saunders Revel rounding  Subjective   Denies racing heart rate or palpitations.  No chest pain.  No SOB/DOE.  She has not ambulated around the room.  She dates that she is okay establishing with our office as below, as she would like to have a cardiologist.  Inpatient Medications    Scheduled Meds: . atenolol  50 mg Oral Daily  . diltiazem  60 mg Oral Q12H  . furosemide  40 mg Oral BID  . insulin aspart  0-15 Units Subcutaneous TID WC  . insulin aspart  0-5 Units Subcutaneous QHS  . potassium chloride  10 mEq Oral BID  . sodium chloride flush  3 mL Intravenous Q12H   Continuous Infusions: . sodium chloride 500 mL (11/19/19 1853)  . cefTRIAXone (ROCEPHIN)  IV 2 g (11/19/19 1854)   PRN Meds: sodium chloride, acetaminophen **OR** acetaminophen, ondansetron **OR** ondansetron (ZOFRAN) IV, polyethylene glycol   Vital Signs    Vitals:   11/19/19 0634 11/19/19 1211 11/19/19 2036 11/20/19 0552  BP: 110/72 120/79 126/64 111/67  Pulse: 89 75 96 94  Resp: 16  20 20   Temp: 98 F (36.7 C) 97.9 F (36.6 C) (!) 97.5 F (36.4 C) 97.8 F (36.6 C)  TempSrc: Oral Oral Oral Oral  SpO2: 93% 97% 99% 97%  Weight:      Height:        Intake/Output Summary (Last 24 hours) at 11/20/2019 0852 Last data filed at 11/20/2019 0500 Gross per 24 hour  Intake 240 ml  Output 2650 ml  Net -2410 ml   Last 3 Weights 11/18/2019 11/18/2019 03/20/2016  Weight (lbs) 292 lb 12.3 oz 250 lb 353 lb 3.2 oz  Weight (kg) 132.8 kg 113.399 kg 160.21 kg      Telemetry    Atrial fibrillation, 80-90s- Personally Reviewed  ECG    No new tracings- Personally Reviewed  Physical Exam   GEN: No acute distress.   Neck: JVD difficult to assess due to body habitus Cardiac: IRIR, no murmurs, rubs, or gallops.  Respiratory: Clear to auscultation from anterior auscultation only due to body habitus and  deconditioning of the patient. GI: obese, nontender, non-distended  MS: Trace bilateral edema, No deformity. Neuro:  Nonfocal  Psych: Normal affect   Labs    High Sensitivity Troponin:   Recent Labs  Lab 11/18/19 0919 11/18/19 1306  TROPONINIHS 7 7      Chemistry Recent Labs  Lab 11/18/19 0919 11/19/19 0418  NA 137 137  K 4.2 3.8  CL 98 101  CO2 26 29  GLUCOSE 207* 139*  BUN 20 19  CREATININE 1.27* 1.10*  CALCIUM 8.6* 7.9*  PROT 7.6  --   ALBUMIN 3.5  --   AST 23  --   ALT 12  --   ALKPHOS 78  --   BILITOT 0.7  --   GFRNONAA 43* 51*  GFRAA 50* 59*  ANIONGAP 13 7     Hematology Recent Labs  Lab 11/18/19 0919 11/19/19 0418  WBC 13.6* 7.9  RBC 4.54 3.91  HGB 12.4 10.8*  HCT 39.9 34.8*  MCV 87.9 89.0  MCH 27.3 27.6  MCHC 31.1 31.0  RDW 15.1 15.3  PLT 279 256    BNP Recent Labs  Lab 11/18/19 0919  BNP 246.5*     DDimer No results for input(s): DDIMER in the last 168 hours.   Radiology  DG Tibia/Fibula Right  Result Date: 11/18/2019 CLINICAL DATA:  Status post fall. Right lower leg pain. Initial encounter. EXAM: RIGHT TIBIA AND FIBULA - 2 VIEW COMPARISON:  None. FINDINGS: No acute bony or joint abnormality. Severe knee and talonavicular osteoarthritis noted. Soft tissues are unremarkable. IMPRESSION: No acute abnormality. Electronically Signed   By: Inge Rise M.D.   On: 11/18/2019 10:16   US Pelvis Complete  Result Date: 11/18/2019 CLINICAL DATA:  Postmenopausal patient with vaginal bleeding. EXAM: TRANSABDOMINAL ULTRASOUND OF PELVIS TECHNIQUE: Transabdominal ultrasound examination of the pelvis was performed including evaluation of the uterus, ovaries, adnexal regions, and pelvic cul-de-sac. COMPARISON:  None. FINDINGS: Uterus Measurements: 11.5 x 6.2 x 7.2 cm = volume: 270 mL. No fibroids or other mass visualized. Endometrium Thickness: 3.5 cm.  No focal abnormality visualized. Right ovary Measurements: Not visualized. Left ovary  Measurements: Not visualized. Other findings: Imaging of the urinary bladder demonstrates an echogenic lesion within the bladder measuring 4.3 x 2.8 x 6.5 cm. There is flow within the lesion on Doppler imaging. IMPRESSION: Endometrial thickness is considered abnormal for a post-menopausal female. Endometrial sampling should be considered to exclude carcinoma. Lesion within the urinary bladder with flow on Doppler imaging worrisome for neoplasm. Recommend consult with urology. Electronically Signed   By: Inge Rise M.D.   On: 11/18/2019 14:55   DG Pelvis Portable  Result Date: 11/18/2019 CLINICAL DATA:  Status post fall.  Pain.  Initial encounter. EXAM: PORTABLE PELVIS 1-2 VIEWS COMPARISON:  None. FINDINGS: There is no evidence of pelvic fracture or diastasis. No pelvic bone lesions are seen. IMPRESSION: Negative exam. Electronically Signed   By: Inge Rise M.D.   On: 11/18/2019 10:16   DG Chest Portable 1 View  Result Date: 11/18/2019 CLINICAL DATA:  Status post fall today. EXAM: PORTABLE CHEST 1 VIEW COMPARISON:  None. FINDINGS: The lungs are clear. Heart size is upper normal. No pneumothorax or pleural effusion. No acute or focal bony abnormality. IMPRESSION: No acute disease. Electronically Signed   By: Inge Rise M.D.   On: 11/18/2019 10:12   DG Knee Complete 4 Views Left  Result Date: 11/18/2019 CLINICAL DATA:  Status post fall. Right knee pain. Initial encounter. EXAM: LEFT KNEE - COMPLETE 4+ VIEW COMPARISON:  None. FINDINGS: There is no acute bony or joint abnormality. Severe tricompartmental osteoarthritis noted. No joint effusion. IMPRESSION: No acute finding. Severe tricompartmental osteoarthritis. Electronically Signed   By: Inge Rise M.D.   On: 11/18/2019 10:15   ECHOCARDIOGRAM COMPLETE  Result Date: 11/19/2019    ECHOCARDIOGRAM REPORT   Patient Name:   Brittney Shepard Date of Exam: 11/19/2019 Medical Rec #:  [id number]        Height:       65.0 in Accession #:     [id number]       Weight:       292.8 lb Date of Birth:  June 21, 1950        BSA:          2.326 m Patient Age:    69 years         BP:           110/72 mmHg Patient Gender: F                HR:           89 bpm. Exam Location:  [facility] Procedure: 2D Echo, Cardiac Doppler and Color Doppler Indications:     Abnormal ECG 794.31  History:  Patient has prior history of Echocardiogram examinations, most                  recent 03/23/2016. Stroke; Risk Factors:Hypertension and                  Diabetes.  Sonographer:     Sherrie Sport RDCS (AE) Referring Phys:  [id number] [facility] AMIN Diagnosing Phys: Harrell Gave End MD  Sonographer Comments: Technically challenging study due to limited acoustic windows, no apical window and no subcostal window. IMPRESSIONS  1. Left ventricular ejection fraction, by estimation, is >55%. The left ventricle has normal function. Left ventricular endocardial border not optimally defined to evaluate regional wall motion. There is severe left ventricular hypertrophy. Left ventricular diastolic function could not be evaluated.  2. Right ventricular systolic function is normal. The right ventricular size is not well visualized. Tricuspid regurgitation signal is inadequate for assessing PA pressure.  3. The mitral valve is grossly normal. Trivial mitral valve regurgitation.  4. The aortic valve is grossly normal. Aortic valve regurgitation not well assessed. FINDINGS  Left Ventricle: Left ventricular ejection fraction, by estimation, is >55%. The left ventricle has normal function. Left ventricular endocardial border not optimally defined to evaluate regional wall motion. The left ventricular internal cavity size was  normal in size. There is severe left ventricular hypertrophy. Left ventricular diastolic function could not be evaluated. Right Ventricle: The right ventricular size is not well visualized. No increase in right ventricular wall thickness. Right ventricular systolic function is normal.  Tricuspid regurgitation signal is inadequate for assessing PA pressure. Left Atrium: Left atrial size was not well visualized. Right Atrium: Right atrial size was not well visualized. Pericardium: The pericardium was not well visualized. Mitral Valve: The mitral valve is grossly normal. Mild mitral annular calcification. Trivial mitral valve regurgitation. Tricuspid Valve: The tricuspid valve is grossly normal. Tricuspid valve regurgitation is trivial. Aortic Valve: The aortic valve is grossly normal. Aortic valve regurgitation not well assessed. Pulmonic Valve: The pulmonic valve was not well visualized. Pulmonic valve regurgitation is not visualized. No evidence of pulmonic stenosis. Aorta: The aortic root is normal in size and structure. Pulmonary Artery: The pulmonary artery is of normal size. IAS/Shunts: The interatrial septum was not well visualized.  LEFT VENTRICLE PLAX 2D LVIDd:         4.60 cm LVIDs:         2.97 cm LV PW:         1.89 cm LV IVS:        2.25 cm LVOT diam:     2.00 cm LVOT Area:     3.14 cm  LEFT ATRIUM         Index LA diam:    5.70 cm 2.45 cm/m                        PULMONIC VALVE AORTA                 PV Vmax:        0.72 m/s Ao Root diam: 3.50 cm PV Peak grad:   2.1 mmHg                       RVOT Peak grad: 2 mmHg   SHUNTS Systemic Diam: 2.00 cm Nelva Bush MD Electronically signed by Nelva Bush MD Signature Date/Time: 11/19/2019/6:33:33 PM    Final    CT HEMATURIA WORKUP  Result Date: 11/19/2019 CLINICAL DATA:  Hematuria.  Weakness. EXAM: CT ABDOMEN AND PELVIS WITHOUT AND WITH CONTRAST TECHNIQUE: Multidetector CT imaging of the abdomen and pelvis was performed following the standard protocol before and following the bolus administration of intravenous contrast. CONTRAST:  159mL OMNIPAQUE IOHEXOL 300 MG/ML  SOLN COMPARISON:  Pelvic ultrasound obtained yesterday. FINDINGS: Lower chest: Enlarged heart. Minimal pericardial effusion with a maximum thickness of 11 mm. Minimal  bibasilar atelectasis or scarring. Hepatobiliary: 2 tiny gallstones in the gallbladder and ill-defined soft tissue density in the dependent portion of the gallbladder. No gallbladder wall thickening or pericholecystic fluid. Unremarkable liver. Pancreas: Unremarkable. No pancreatic ductal dilatation or surrounding inflammatory changes. Spleen: Normal in size without focal abnormality. Adrenals/Urinary Tract: Rounded fat density left adrenal mass measuring 2.0 cm on image number 23 series 2. Normal appearing right adrenal gland. Mild-to-moderate dilatation of the left renal collecting system and ureter to the level of the ureterovesical junction. At that location, there is an irregular, rounded mass in the bladder measuring 3.7 x 3.7 cm on image number 78 series 2 with a small anterior calcification. This mass measures 4.0 cm in length on sagittal image number 75. To the right of midline in the dependent portion of the bladder, there is a 2nd mass measures 3.1 x 1.8 x 1.7 cm on sagittal image number 67 and axial image number 78 series 2. 3 mm right renal calculus and 1.4 cm rounded, medium density mass arising from the lateral aspect of the midportion of the right kidney, measured on image number 28 series 2. Normal appearing right ureter. Stomach/Bowel: Stomach is within normal limits. Appendix appears normal. No evidence of bowel wall thickening, distention, or inflammatory changes. Vascular/Lymphatic: Atheromatous arterial calcifications without aneurysm. No enlarged lymph nodes. Reproductive: Heterogeneous endometrial mass measuring 4.5 x 4.0 cm on image number 60 series 2 and 6.3 cm in length on sagittal image number 80. No adnexal mass seen. Other: Small amount of subcutaneous air anteriorly on the right, compatible with recent subcutaneous injection. There is also bilateral subcutaneous edema at the level of the lower abdomen, greater on the left. Musculoskeletal: Lumbar and lower thoracic spine degenerative  changes. IMPRESSION: 1. 4.0 x 3.7 x 3.7 cm irregular, rounded mass in the bladder on the left, compatible with a primary bladder carcinoma. This is obstructing the distal left ureter causing mild to moderate left hydronephrosis and hydroureter. 2. 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 3. 3 mm nonobstructing right renal calculus and 1.4 cm right renal mass or proteinaceous cyst. 4. 6.3 x 4.5 x 4.0 cm heterogeneous endometrial mass. 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 5. Calcified cholelithiasis and possible noncalcified cholelithiasis or sludge in the gallbladder 6. Minimal pericardial effusion. 7. Cardiomegaly. 8. 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 Electronically Signed   By: Claudie Revering M.D.   On: 11/19/2019 14:21    Cardiac Studies   Echo 11/20/19 1. Left ventricular ejection fraction, by estimation, is >55%. The left  ventricle has normal function. Left ventricular endocardial border not  optimally defined to evaluate regional wall motion. There is severe left  ventricular hypertrophy. Left  ventricular diastolic function could not be evaluated.  2. Right ventricular systolic function is normal. The right ventricular  size is not well visualized. Tricuspid regurgitation signal is inadequate  for assessing PA pressure.  3. The mitral valve is grossly normal. Trivial mitral valve  regurgitation.  4. The aortic valve is grossly normal.  Aortic valve regurgitation not  well assessed.   Patient Profile     69 y.o. female with a history of HTN, DM2, atrial fibrillation, and who is being seen today for the evaluation of atrial fibrillation with RVR.  Assessment & Plan    Atrial fibrillation with RVR, suspected as permanent --Asx. Suspected to have been in atrial fibrillation for several years. --Treated with AV nodal blocking agents. Recommend ongoing rate control with goal ventricular rate below  110bpm. Currently on both atenolol 50mg  daily and diltiazem 60mg  q12h. --Most recent echo above with EF normal greater than 55%.  Severe LVH.   --Not on chronic anticoagulation with a CHA2DS2VASc score of at least 6 (HTN, age, DM2, strokex2, female). Patient has declined oral anticoagulation and understands the increased risk of stroke, which is calculated at 9.7%. She is continued instead on ASA 81mg  daily.  --Confirmed today that patient still declines anticoagulation. She is agreeable to establish with our office, however.  --No plan for DCCV or TEE/DCCV given suspected permanent Afib and also given anticoagulation refused.   Hypertension --BP well controlled. Continue current medications.   For questions or updates, please contact [place] Please consult [email or web address] for contact info under        Signed, Arvil Chaco, PA-C  11/20/2019, 8:52 AM

## 2019-11-20 NOTE — Anesthesia Preprocedure Evaluation (Addendum)
Anesthesia Evaluation  Patient identified by MRN, date of birth, ID band Patient awake    Reviewed: Allergy & Precautions, H&P , NPO status , Patient's Chart, lab work & pertinent test results  Airway Mallampati: I  TM Distance: >3 FB     Dental   Pulmonary shortness of breath and with exertion, COPD, Not current smoker,    breath sounds clear to auscultation       Cardiovascular hypertension, (-) angina(-) Past MI and (-) Cardiac Stents + dysrhythmias (not on anticoagulation (pt refuses)) Atrial Fibrillation  Rhythm:irregular Rate:Normal     Neuro/Psych CVA (2012), No Residual Symptoms negative psych ROS   GI/Hepatic negative GI ROS, Neg liver ROS,   Endo/Other  diabetesMorbid obesity  Renal/GU      Musculoskeletal   Abdominal   Peds  Hematology  (+) Blood dyscrasia, anemia , Hgb 10.8   Anesthesia Other Findings DNR??  Past Medical History: No date: Diabetes mellitus without complication ([place]) No date: Hypertension 2013: Stroke [facility])  History reviewed. No pertinent surgical history.  BMI    Body Mass Index: 48.72 kg/m      Reproductive/Obstetrics negative OB ROS                            Anesthesia Physical Anesthesia Plan  ASA: III  Anesthesia Plan: General ETT   Post-op Pain Management:    Induction:   PONV Risk Score and Plan: Ondansetron, Dexamethasone, Midazolam and Treatment may vary due to age or medical condition  Airway Management Planned:   Additional Equipment:   Intra-op Plan:   Post-operative Plan:   Informed Consent: I have reviewed the patients History and Physical, chart, labs and discussed the procedure including the risks, benefits and alternatives for the proposed anesthesia with the patient or authorized representative who has indicated his/her understanding and acceptance.     Dental Advisory Given  Plan Discussed with: Anesthesiologist, CRNA  and Surgeon  Anesthesia Plan Comments:         Anesthesia Quick Evaluation

## 2019-11-20 NOTE — Progress Notes (Signed)
Brittney Shepard    MR#:  [id number]  DATE OF BIRTH:  Jul 08, 1950  SUBJECTIVE:  patientcame in with generalized weakness had stumbled out of her bed and fell. Did not lose consciousness. She has noticed some change in the color for urine. Denies any vaginally bleeding that she can recall. Denies any abdominal pain. No nausea vomiting or fever  No new complaints today. Awaiting surgical procedure  REVIEW OF SYSTEMS:   Review of Systems  Constitutional: Negative for chills, fever and weight loss.  HENT: Negative for ear discharge, ear pain and nosebleeds.   Eyes: Negative for blurred vision, pain and discharge.  Respiratory: Negative for sputum production, shortness of breath, wheezing and stridor.   Cardiovascular: Negative for chest pain, palpitations, orthopnea and PND.  Gastrointestinal: Negative for abdominal pain, diarrhea, nausea and vomiting.  Genitourinary: Positive for hematuria. Negative for frequency and urgency.  Musculoskeletal: Positive for falls. Negative for back pain and joint pain.  Neurological: Positive for weakness. Negative for sensory change, speech change and focal weakness.  Psychiatric/Behavioral: Negative for depression and hallucinations. The patient is not nervous/anxious.    Tolerating Diet:NPO at present Tolerating PT: yes--rec rehab  DRUG ALLERGIES:   Allergies  Allergen Reactions  . Penicillin G Hives  . Sulfa Antibiotics Rash    VITALS:  Blood pressure 132/72, pulse (!) 51, temperature 97.7 F (36.5 C), temperature source Oral, resp. rate 18, height 5\' 5"  (1.651 m), weight 132.8 kg, SpO2 95 %.  PHYSICAL EXAMINATION:   Physical Exam  GENERAL:  69 y.o.-year-old patient lying in the bed with no acute distress. Obese-morbidly EYES: Pupils equal, round, reactive to light and accommodation. No scleral icterus.   HEENT: Head atraumatic, normocephalic. Oropharynx and nasopharynx  clear.  NECK:  Supple, no jugular venous distention. No thyroid enlargement, no tenderness.  LUNGS: Normal breath sounds bilaterally, no wheezing, rales, rhonchi. No use of accessory muscles of respiration.  CARDIOVASCULAR: S1, S2 normal. No murmurs, rubs, or gallops.  ABDOMEN: Soft, nontender, nondistended. Bowel sounds present. No organomegaly or mass. Abdominal obesity+ EXTREMITIES: No cyanosis, clubbing or edema b/l.   Right tibial shin blister -POA NEUROLOGIC: Cranial nerves II through XII are intact. No focal Motor or sensory deficits b/l.  weak PSYCHIATRIC:  patient is alert and oriented x 3.  SKIN: No obvious rash, lesion, or ulcer.   LABORATORY PANEL:  CBC Recent Labs  Lab 11/19/19 0418  WBC 7.9  HGB 10.8*  HCT 34.8*  PLT 256    Chemistries  Recent Labs  Lab 11/18/19 0919 11/18/19 0919 11/19/19 0418  NA 137   < > 137  K 4.2   < > 3.8  CL 98   < > 101  CO2 26   < > 29  GLUCOSE 207*   < > 139*  BUN 20   < > 19  CREATININE 1.27*   < > 1.10*  CALCIUM 8.6*   < > 7.9*  MG 2.1  --   --   AST 23  --   --   ALT 12  --   --   ALKPHOS 78  --   --   BILITOT 0.7  --   --    < > = values in this interval not displayed.   Cardiac Enzymes No results for input(s): TROPONINI in the last 168 hours. RADIOLOGY:  US Pelvis Complete  Result Date: 11/18/2019 CLINICAL DATA:  Postmenopausal patient with vaginal  bleeding. EXAM: TRANSABDOMINAL ULTRASOUND OF PELVIS TECHNIQUE: Transabdominal ultrasound examination of the pelvis was performed including evaluation of the uterus, ovaries, adnexal regions, and pelvic cul-de-sac. COMPARISON:  None. FINDINGS: Uterus Measurements: 11.5 x 6.2 x 7.2 cm = volume: 270 mL. No fibroids or other mass visualized. Endometrium Thickness: 3.5 cm.  No focal abnormality visualized. Right ovary Measurements: Not visualized. Left ovary Measurements: Not visualized. Other findings: Imaging of the urinary bladder demonstrates an echogenic lesion within the bladder  measuring 4.3 x 2.8 x 6.5 cm. There is flow within the lesion on Doppler imaging. IMPRESSION: Endometrial thickness is considered abnormal for a post-menopausal female. Endometrial sampling should be considered to exclude carcinoma. Lesion within the urinary bladder with flow on Doppler imaging worrisome for neoplasm. Recommend consult with urology. Electronically Signed   By: Inge Rise M.D.   On: 11/18/2019 14:55   DG OR UROLOGY CYSTO IMAGE ([facility] ONLY)  Result Date: 11/20/2019 There is no interpretation for this exam.  This order is for images obtained during a surgical procedure.  Please See "Surgeries" Tab for more information regarding the procedure.   ECHOCARDIOGRAM COMPLETE  Result Date: 11/19/2019    ECHOCARDIOGRAM REPORT   Patient Name:   [facility] Date of Exam: 11/19/2019 Medical Rec #:  [id number]        Height:       65.0 in Accession #:    [id number]       Weight:       292.8 lb Date of Birth:  July 29, 1950        BSA:          2.326 m Patient Age:    71 years         BP:           110/72 mmHg Patient Gender: F                HR:           89 bpm. Exam Location:  [facility] Procedure: 2D Echo, Cardiac Doppler and Color Doppler Indications:     Abnormal ECG 794.31  History:         Patient has prior history of Echocardiogram examinations, most                  recent 03/23/2016. Stroke; Risk Factors:Hypertension and                  Diabetes.  Sonographer:     Sherrie Sport RDCS (AE) Referring Phys:  [id number] [facility] AMIN Diagnosing Phys: Harrell Gave End MD  Sonographer Comments: Technically challenging study due to limited acoustic windows, no apical window and no subcostal window. IMPRESSIONS  1. Left ventricular ejection fraction, by estimation, is >55%. The left ventricle has normal function. Left ventricular endocardial border not optimally defined to evaluate regional wall motion. There is severe left ventricular hypertrophy. Left ventricular diastolic function could not be evaluated.  2.  Right ventricular systolic function is normal. The right ventricular size is not well visualized. Tricuspid regurgitation signal is inadequate for assessing PA pressure.  3. The mitral valve is grossly normal. Trivial mitral valve regurgitation.  4. The aortic valve is grossly normal. Aortic valve regurgitation not well assessed. FINDINGS  Left Ventricle: Left ventricular ejection fraction, by estimation, is >55%. The left ventricle has normal function. Left ventricular endocardial border not optimally defined to evaluate regional wall motion. The left ventricular internal cavity size was  normal in size. There is severe left ventricular hypertrophy.  Left ventricular diastolic function could not be evaluated. Right Ventricle: The right ventricular size is not well visualized. No increase in right ventricular wall thickness. Right ventricular systolic function is normal. Tricuspid regurgitation signal is inadequate for assessing PA pressure. Left Atrium: Left atrial size was not well visualized. Right Atrium: Right atrial size was not well visualized. Pericardium: The pericardium was not well visualized. Mitral Valve: The mitral valve is grossly normal. Mild mitral annular calcification. Trivial mitral valve regurgitation. Tricuspid Valve: The tricuspid valve is grossly normal. Tricuspid valve regurgitation is trivial. Aortic Valve: The aortic valve is grossly normal. Aortic valve regurgitation not well assessed. Pulmonic Valve: The pulmonic valve was not well visualized. Pulmonic valve regurgitation is not visualized. No evidence of pulmonic stenosis. Aorta: The aortic root is normal in size and structure. Pulmonary Artery: The pulmonary artery is of normal size. IAS/Shunts: The interatrial septum was not well visualized.  LEFT VENTRICLE PLAX 2D LVIDd:         4.60 cm LVIDs:         2.97 cm LV PW:         1.89 cm LV IVS:        2.25 cm LVOT diam:     2.00 cm LVOT Area:     3.14 cm  LEFT ATRIUM         Index LA  diam:    5.70 cm 2.45 cm/m                        PULMONIC VALVE AORTA                 PV Vmax:        0.72 m/s Ao Root diam: 3.50 cm PV Peak grad:   2.1 mmHg                       RVOT Peak grad: 2 mmHg   SHUNTS Systemic Diam: 2.00 cm Nelva Bush MD Electronically signed by Nelva Bush MD Signature Date/Time: 11/19/2019/6:33:33 PM    Final    CT HEMATURIA WORKUP  Addendum Date: 11/20/2019   ADDENDUM REPORT: 11/20/2019 12:05 ADDENDUM: The submitted images are without intravenous contrast. In addition to the above findings, there is an oval, heterogeneous, faintly visualized mass in the inferior aspect of the medial segment of the left lobe of the liver, adjacent to the gallbladder fossa, measuring 4.3 x 3.6 cm in maximum dimensions on image number 31 series 4. This is concerning for a liver metastasis. These results will be called to the ordering clinician or representative by the Radiologist Assistant, and communication documented in the PACS or [employer]. Electronically Signed   By: Claudie Revering M.D.   On: 11/20/2019 12:05   Result Date: 11/20/2019 CLINICAL DATA:  Hematuria.  Weakness. EXAM: CT ABDOMEN AND PELVIS WITHOUT AND WITH CONTRAST TECHNIQUE: Multidetector CT imaging of the abdomen and pelvis was performed following the standard protocol before and following the bolus administration of intravenous contrast. CONTRAST:  145mL OMNIPAQUE IOHEXOL 300 MG/ML  SOLN COMPARISON:  Pelvic ultrasound obtained yesterday. FINDINGS: Lower chest: Enlarged heart. Minimal pericardial effusion with a maximum thickness of 11 mm. Minimal bibasilar atelectasis or scarring. Hepatobiliary: 2 tiny gallstones in the gallbladder and ill-defined soft tissue density in the dependent portion of the gallbladder. No gallbladder wall thickening or pericholecystic fluid. Unremarkable liver. Pancreas: Unremarkable. No pancreatic ductal dilatation or surrounding inflammatory changes. Spleen: Normal in size without focal  abnormality. Adrenals/Urinary Tract: Rounded fat density left adrenal mass measuring 2.0 cm on image number 23 series 2. Normal appearing right adrenal gland. Mild-to-moderate dilatation of the left renal collecting system and ureter to the level of the ureterovesical junction. At that location, there is an irregular, rounded mass in the bladder measuring 3.7 x 3.7 cm on image number 78 series 2 with a small anterior calcification. This mass measures 4.0 cm in length on sagittal image number 75. To the right of midline in the dependent portion of the bladder, there is a 2nd mass measures 3.1 x 1.8 x 1.7 cm on sagittal image number 67 and axial image number 78 series 2. 3 mm right renal calculus and 1.4 cm rounded, medium density mass arising from the lateral aspect of the midportion of the right kidney, measured on image number 28 series 2. Normal appearing right ureter. Stomach/Bowel: Stomach is within normal limits. Appendix appears normal. No evidence of bowel wall thickening, distention, or inflammatory changes. Vascular/Lymphatic: Atheromatous arterial calcifications without aneurysm. No enlarged lymph nodes. Reproductive: Heterogeneous endometrial mass measuring 4.5 x 4.0 cm on image number 60 series 2 and 6.3 cm in length on sagittal image number 80. No adnexal mass seen. Other: Small amount of subcutaneous air anteriorly on the right, compatible with recent subcutaneous injection. There is also bilateral subcutaneous edema at the level of the lower abdomen, greater on the left. Musculoskeletal: Lumbar and lower thoracic spine degenerative changes. IMPRESSION: 1. 4.0 x 3.7 x 3.7 cm irregular, rounded mass in the bladder on the left, compatible with a primary bladder carcinoma. This is obstructing the distal left ureter causing mild to moderate left hydronephrosis and hydroureter. 2. 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 3. 3 mm  nonobstructing right renal calculus and 1.4 cm right renal mass or proteinaceous cyst. 4. 6.3 x 4.5 x 4.0 cm heterogeneous endometrial mass. 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 5. Calcified cholelithiasis and possible noncalcified cholelithiasis or sludge in the gallbladder 6. Minimal pericardial effusion. 7. Cardiomegaly. 8. 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 Electronically Signed: By: Claudie Revering M.D. On: 11/19/2019 14:21   ASSESSMENT AND PLAN:   Brittney Shepard is a 69 y.o. female with medical history significant of diabetes mellitus, hypertension and prior stroke without any significant residual deficit was brought to ED after having a fall. Apparently patient fell while sleeping.  She took a turn and fell down from bed around 2:30 AM.  She remained on the floor till 7 AM when she heard her neighbor and called him for help.  A. fib with RVR.(h/o Afib in old records in 2017) -Rate improved with IV Cardizem.   -Patient was on Cardizem and Tenormin at home and never took her medications--believe for HTN - pt not aware of any prior history of abnormal rhythm or A. Fib.  She was admitted in 2017 with altered mental status and A. fib was mentioned in discharge.  Prior EKG done in 2017 with A. Fib.  On discharge summary she was never started on anticoagulation due to concern of microcytic anemia. -Troponin negative and BNP mildly elevated at 246. -CHA2DS2-VASc score of 4. -Restart home dose of Cardizem and atenolol. -Check TSH. -Cardiology consult with dr Burt Knack [facility] -Urology recommends holding any anticoagulation due to hematuria w/u  Hematuria/?UTI/Bladder mass/lesion  UA with hematuria and pyuria.  History of recurrent urinary infections.  No prior culture found.  She does has some pyuria although remained afebrile and mild leukocytosis which can also be explained with fall. -IV Ceftriaxone for now -Follow-up urine cultures--multiple species.  -since pt is having  urological procedure and h/o UTI's will give tota 5 days IV abxs/ -US pelvis shows new bladder lesion--d/w dr Diamantina Providence  - CT hematuria 1. 4.0 x 3.7 x 3.7 cm irregular, rounded mass in the bladder on the left, compatible with a primary bladder carcinoma. This is obstructing the distal left ureter causing mild to moderate left hydronephrosis and hydroureter. 2. 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 3. 3 mm nonobstructing right renal calculus and 1.4 cm right renal mass or proteinaceous cyst. 4. 6.3 x 4.5 x 4.0 cm heterogeneous endometrial mass. 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 5. Calcified cholelithiasis and possible noncalcified cholelithiasis or sludge in the gallbladder 6. Minimal pericardial effusion. 7. Cardiomegaly. 8. 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 9.there is an oval, heterogeneous, faintly visualized mass in the inferior aspect of the medial segment of the left lobe of the liver, adjacent to the gallbladder fossa, measuring 4.3 x 3.6 cm in maximum dimensions on image number 31 series 4. This is concerning for a liver metastasis  -Gynecology consult with Dr Georgianne Fick-- endoemetrial biopsy as out pt -7/19>>-pt to undergo cystoscopy with tumor removal and left ureteral stent placement -7/19>. Oncology consult with Dr Rush Barer.  Patient has 2 falls recently while sleeping and rolling over from bed.  No history of leg weakness.  She uses walker to walk around. -PT/OT evaluation--recommends rehab   Increased endometrial thickening.  Found on pelvic ultrasound.  Denies any post menopausal bleeding. -Needs gynecologic evaluation as an outpatient  Hypertension. -Continue home dose of atenolol and Cardizem.  Chronic lower extremity edema.  Echocardiogram done in 3790 with diastolic dysfunction. -Continue home dose of Lasix. -Repeat echocardiogram  Type 2 diabetes.  Was on  Metformin at home. - A1c 6.2 -SSI  Morbid obesity. Body mass index is 41.6 kg/m.  DVT prophylaxis: Lovenox Code Status: Full code Family Communication: spoke with  joey--son on hte phone Disposition Plan: Home Consults called: Cardiology, urolgoy Admission status: observation  patient needs further workup for her bladder mass/hematuria. Urology consultation placed. Stat CT is being done. Further recommendations by urology after reviewing CT scan. Cont IV abxs for presumed UTI Cardiology consulted for AFib--pending  TOTAL TIME TAKING CARE OF THIS PATIENT: 25 minutes.  >50% time spent on counselling and coordination of care  Note: This dictation was prepared with Dragon dictation along with smaller phrase technology. Any transcriptional errors that result from this process are unintentional.  Fritzi Mandes M.D    [facility]   CC: Primary care physician; Marguerita Merles, MDPatient ID: Brittney Shepard, female   DOB: 1950/08/30, 69 y.o.   MRN: [id number]

## 2019-11-21 ENCOUNTER — Inpatient Hospital Stay: Payer: Medicare Other

## 2019-11-21 ENCOUNTER — Telehealth: Payer: Self-pay | Admitting: Urology

## 2019-11-21 ENCOUNTER — Encounter: Payer: Self-pay | Admitting: Urology

## 2019-11-21 DIAGNOSIS — R310 Gross hematuria: Secondary | ICD-10-CM

## 2019-11-21 DIAGNOSIS — R160 Hepatomegaly, not elsewhere classified: Secondary | ICD-10-CM

## 2019-11-21 DIAGNOSIS — N939 Abnormal uterine and vaginal bleeding, unspecified: Secondary | ICD-10-CM

## 2019-11-21 DIAGNOSIS — N3289 Other specified disorders of bladder: Secondary | ICD-10-CM

## 2019-11-21 LAB — GLUCOSE, CAPILLARY
Glucose-Capillary: 164 mg/dL — ABNORMAL HIGH (ref 70–99)
Glucose-Capillary: 171 mg/dL — ABNORMAL HIGH (ref 70–99)
Glucose-Capillary: 140 mg/dL — ABNORMAL HIGH (ref 70–99)
Glucose-Capillary: 202 mg/dL — ABNORMAL HIGH (ref 70–99)

## 2019-11-21 MED ORDER — CEPHALEXIN 500 MG PO CAPS
500.0000 mg | ORAL_CAPSULE | Freq: Three times a day (TID) | ORAL | Status: DC
  Administered 2019-11-21 – 2019-11-23 (×7): 500 mg via ORAL
  Filled 2019-11-21 (×7): qty 1

## 2019-11-21 NOTE — Progress Notes (Signed)
Chief Complaint: Patient was seen in consultation today for liver lesion   Referring Physician(s): Dr. Janese Banks  Supervising Physician: Corrie Mckusick  Patient Status: [facility] - In-pt  History of Present Illness: Brittney Shepard is a 69 y.o. female admitted after fall and worked up for hematuria. Found to have bladder mass and liver lesion. Underwent cysto with bladder biopsy and (L)ureteral stent placement MRI abd ordered and after review, IR is asked to perform imge guided biopsy of liver mass.  Past Medical History:  Diagnosis Date  . Diabetes mellitus without complication ([place])   . Hypertension   . Stroke [facility]) 2013    History reviewed. No pertinent surgical history.  Allergies: Penicillin g and Sulfa antibiotics  Medications:  Current Facility-Administered Medications:  .  0.9 %  sodium chloride infusion, , Intravenous, PRN, Billey Co, MD, Stopped at 11/20/19 2050 .  acetaminophen (TYLENOL) tablet 650 mg, 650 mg, Oral, Q6H PRN **OR** acetaminophen (TYLENOL) suppository 650 mg, 650 mg, Rectal, Q6H PRN, Billey Co, MD .  atenolol (TENORMIN) tablet 50 mg, 50 mg, Oral, Daily, Billey Co, MD, 50 mg at 11/21/19 2725 .  cephALEXin (KEFLEX) capsule 500 mg, 500 mg, Oral, Q8H, Fritzi Mandes, MD, 500 mg at 11/21/19 1337 .  Chlorhexidine Gluconate Cloth 2 % PADS 6 each, 6 each, Topical, Daily, Fritzi Mandes, MD, 6 each at 11/21/19 0036 .  diltiazem (CARDIZEM SR) 12 hr capsule 60 mg, 60 mg, Oral, Q12H, Billey Co, MD, 60 mg at 11/21/19 3664 .  furosemide (LASIX) tablet 40 mg, 40 mg, Oral, BID, Billey Co, MD, 40 mg at 11/21/19 4034 .  insulin aspart (novoLOG) injection 0-15 Units, 0-15 Units, Subcutaneous, TID WC, Billey Co, MD, 3 Units at 11/21/19 1222 .  insulin aspart (novoLOG) injection 0-5 Units, 0-5 Units, Subcutaneous, QHS, Billey Co, MD, 3 Units at 11/20/19 2103 .  ondansetron (ZOFRAN) tablet 4 mg, 4 mg, Oral, Q6H PRN **OR** ondansetron  (ZOFRAN) injection 4 mg, 4 mg, Intravenous, Q6H PRN, Nickolas Madrid C, MD .  polyethylene glycol (MIRALAX / GLYCOLAX) packet 17 g, 17 g, Oral, Daily PRN, Nickolas Madrid C, MD .  potassium chloride (KLOR-CON) CR tablet 10 mEq, 10 mEq, Oral, BID, Billey Co, MD, 10 mEq at 11/21/19 7425 .  sodium chloride flush (NS) 0.9 % injection 3 mL, 3 mL, Intravenous, Q12H, Billey Co, MD, 3 mL at 11/21/19 9563    History reviewed. No pertinent family history.  Social History   Socioeconomic History  . Marital status: Widowed    Spouse name: Not on file  . Number of children: Not on file  . Years of education: Not on file  . Highest education level: Not on file  Occupational History  . Not on file  Tobacco Use  . Smoking status: Never Smoker  . Smokeless tobacco: Never Used  Substance and Sexual Activity  . Alcohol use: No  . Drug use: No  . Sexual activity: Not on file  Other Topics Concern  . Not on file  Social History Narrative  . Not on file   Social Determinants of Health   Financial Resource Strain:   . Difficulty of Paying Living Expenses:   Food Insecurity:   . Worried About Charity fundraiser in the Last Year:   . Arboriculturist in the Last Year:   Transportation Needs:   . Film/video editor (Medical):   Marland Kitchen Lack of Transportation (Non-Medical):  Physical Activity:   . Days of Exercise per Week:   . Minutes of Exercise per Session:   Stress:   . Feeling of Stress :   Social Connections:   . Frequency of Communication with Friends and Family:   . Frequency of Social Gatherings with Friends and Family:   . Attends Religious Services:   . Active Member of Clubs or Organizations:   . Attends Archivist Meetings:   Marland Kitchen Marital Status:      Review of Systems: A 12 point ROS discussed and pertinent positives are indicated in the HPI above.  All other systems are negative.  Review of Systems  Vital Signs: BP 101/77 (BP Location: Right Arm)    Pulse 75   Temp 98 F (36.7 C) (Oral)   Resp 20   Ht 5\' 5"  (1.651 m)   Wt 132.8 kg   SpO2 98%   BMI 48.72 kg/m   Physical Exam Constitutional:      Appearance: Normal appearance. She is obese. She is not ill-appearing.  HENT:     Mouth/Throat:     Mouth: Mucous membranes are moist.  Cardiovascular:     Rate and Rhythm: Normal rate and regular rhythm.     Heart sounds: Normal heart sounds.  Pulmonary:     Effort: Pulmonary effort is normal. No respiratory distress.     Breath sounds: Normal breath sounds.  Abdominal:     Palpations: Abdomen is soft. There is no mass.     Tenderness: There is no abdominal tenderness. There is no guarding.  Skin:    General: Skin is warm and dry.  Neurological:     General: No focal deficit present.     Mental Status: She is alert and oriented to person, place, and time.  Psychiatric:        Mood and Affect: Mood normal.        Thought Content: Thought content normal.        Judgment: Judgment normal.      Imaging: DG Tibia/Fibula Right  Result Date: 11/18/2019 CLINICAL DATA:  Status post fall. Right lower leg pain. Initial encounter. EXAM: RIGHT TIBIA AND FIBULA - 2 VIEW COMPARISON:  None. FINDINGS: No acute bony or joint abnormality. Severe knee and talonavicular osteoarthritis noted. Soft tissues are unremarkable. IMPRESSION: No acute abnormality. Electronically Signed   By: Inge Rise M.D.   On: 11/18/2019 10:16   MR PELVIS W WO CONTRAST  Result Date: 11/21/2019 CLINICAL DATA:  Bladder and endometrial masses., concern for hepatic metastatic disease EXAM: MRI ABDOMEN AND PELVIS WITHOUT AND WITH CONTRAST TECHNIQUE: Multiplanar multisequence MR imaging of the abdomen and pelvis was performed both before and after the administration of intravenous contrast. CONTRAST:  102mL GADAVIST GADOBUTROL 1 MMOL/ML IV SOLN COMPARISON:  CT evaluation of November 19, 2019 FINDINGS: COMBINED FINDINGS FOR BOTH MR ABDOMEN AND PELVIS Lower chest: No effusion  or consolidation. Cardiomegaly. Heart is incompletely imaged. Hepatobiliary: Mass in hepatic subsegment IV B along the gallbladder fossa showing target like appearance measuring approximately 3.9 x 3.1 cm. (Image 48, series 28) mass abuts the gallbladder. Trace pericholecystic fluid. No significant wall enhancement or pericholecystic stranding. No biliary duct dilation. Small satellite lesions seen just inferior to the dominant mass measuring approximately 1 cm (image 57, series 28) Pancreas: Multi cystic changes throughout the pancreas (image 25, series 1812 mm cystic lesion in the head of the pancreas. Between 10 and 15 additional small cystic foci about the pancreas with pancreatic  atrophy and mild main duct distension. An example of another lesion is a 13 mm lesion in the uncinate (image 28, series 18) areas without overtly suspicious features otherwise. Spleen:  Spleen normal in size and contour. 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 No sign of hydronephrosis. Small RIGHT renal cyst. Stomach/Bowel: Gastrointestinal tract is normal to the extent evaluated. Study not performed for bowel evaluation. Vascular/Lymphatic: Patent abdominal vasculature with atherosclerotic plaque in the nonaneurysmal abdominal aorta. Retroaortic LEFT renal vein. Celiac nodal enlargement and portacaval nodal enlargement (image 48, series 27) 12 mm portacaval lymph node adjacent to pancreatic head. (Image 90, series 21) 16 mm celiac lymph node.  IVC is patent. No pelvic adenopathy. Pelvic vasculature not well assessed but grossly patent. Reproductive: Thickening and irregularity of the endometrium measuring approximately 4 cm greatest thickness with cystic changes. Myometrial invasion best seen on sagittal image 23, series 35, there is motion on the current exam which limits assessment but suggestion of invasion at least to 50% of the myometrium, oblique post-contrast images raise this question as well but  obliquity challenges the ability to determine depth of myometrial invasion Urinary bladder is decompressed, large tumor removed by trans urethral resection. Assessment of urinary bladder is limited by under distension. Other:  No ascites. Musculoskeletal: Vertebral hemangiomata and endplate degenerative changes. IMPRESSION: 1. Hepatic mass suspicious for metastatic disease. Given other findings outlined below biopsy may be helpful to determine source of metastasis. 2. Endometrial mass with invasive features. Study limited by motion on the current evaluation but imaging suggests myometrial invasion to at least 50% along the anterior uterus. Given above findings depending on results of additional workup; if depth of myometrial invasion will change patient management a repeat evaluation may be helpful in the outpatient setting. 3. Celiac and portacaval nodal enlargement, suspicious for nodal metastases. 4. Assessment of the urinary bladder is limited by under distension large tumor has been resected. Electronically Signed   By: Zetta Bills M.D.   On: 11/21/2019 08:38   MR ABDOMEN W WO CONTRAST  Result Date: 11/21/2019 CLINICAL DATA:  Bladder and endometrial masses., concern for hepatic metastatic disease EXAM: MRI ABDOMEN AND PELVIS WITHOUT AND WITH CONTRAST TECHNIQUE: Multiplanar multisequence MR imaging of the abdomen and pelvis was performed both before and after the administration of intravenous contrast. CONTRAST:  2mL GADAVIST GADOBUTROL 1 MMOL/ML IV SOLN COMPARISON:  CT evaluation of November 19, 2019 FINDINGS: COMBINED FINDINGS FOR BOTH MR ABDOMEN AND PELVIS Lower chest: No effusion or consolidation. Cardiomegaly. Heart is incompletely imaged. Hepatobiliary: Mass in hepatic subsegment IV B along the gallbladder fossa showing target like appearance measuring approximately 3.9 x 3.1 cm. (Image 48, series 28) mass abuts the gallbladder. Trace pericholecystic fluid. No significant wall enhancement or  pericholecystic stranding. No biliary duct dilation. Small satellite lesions seen just inferior to the dominant mass measuring approximately 1 cm (image 57, series 28) Pancreas: Multi cystic changes throughout the pancreas (image 25, series 1812 mm cystic lesion in the head of the pancreas. Between 10 and 15 additional small cystic foci about the pancreas with pancreatic atrophy and mild main duct distension. An example of another lesion is a 13 mm lesion in the uncinate (image 28, series 18) areas without overtly suspicious features otherwise. Spleen:  Spleen normal in size and contour. 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 No sign of hydronephrosis. Small RIGHT renal cyst. Stomach/Bowel: Gastrointestinal tract is normal to the extent evaluated. Study not performed for bowel evaluation. Vascular/Lymphatic: Patent abdominal vasculature  with atherosclerotic plaque in the nonaneurysmal abdominal aorta. Retroaortic LEFT renal vein. Celiac nodal enlargement and portacaval nodal enlargement (image 48, series 27) 12 mm portacaval lymph node adjacent to pancreatic head. (Image 90, series 21) 16 mm celiac lymph node.  IVC is patent. No pelvic adenopathy. Pelvic vasculature not well assessed but grossly patent. Reproductive: Thickening and irregularity of the endometrium measuring approximately 4 cm greatest thickness with cystic changes. Myometrial invasion best seen on sagittal image 23, series 35, there is motion on the current exam which limits assessment but suggestion of invasion at least to 50% of the myometrium, oblique post-contrast images raise this question as well but obliquity challenges the ability to determine depth of myometrial invasion Urinary bladder is decompressed, large tumor removed by trans urethral resection. Assessment of urinary bladder is limited by under distension. Other:  No ascites. Musculoskeletal: Vertebral hemangiomata and endplate degenerative changes.  IMPRESSION: 1. Hepatic mass suspicious for metastatic disease. Given other findings outlined below biopsy may be helpful to determine source of metastasis. 2. Endometrial mass with invasive features. Study limited by motion on the current evaluation but imaging suggests myometrial invasion to at least 50% along the anterior uterus. Given above findings depending on results of additional workup; if depth of myometrial invasion will change patient management a repeat evaluation may be helpful in the outpatient setting. 3. Celiac and portacaval nodal enlargement, suspicious for nodal metastases. 4. Assessment of the urinary bladder is limited by under distension large tumor has been resected. Electronically Signed   By: Zetta Bills M.D.   On: 11/21/2019 08:38   US Pelvis Complete  Result Date: 11/18/2019 CLINICAL DATA:  Postmenopausal patient with vaginal bleeding. EXAM: TRANSABDOMINAL ULTRASOUND OF PELVIS TECHNIQUE: Transabdominal ultrasound examination of the pelvis was performed including evaluation of the uterus, ovaries, adnexal regions, and pelvic cul-de-sac. COMPARISON:  None. FINDINGS: Uterus Measurements: 11.5 x 6.2 x 7.2 cm = volume: 270 mL. No fibroids or other mass visualized. Endometrium Thickness: 3.5 cm.  No focal abnormality visualized. Right ovary Measurements: Not visualized. Left ovary Measurements: Not visualized. Other findings: Imaging of the urinary bladder demonstrates an echogenic lesion within the bladder measuring 4.3 x 2.8 x 6.5 cm. There is flow within the lesion on Doppler imaging. IMPRESSION: Endometrial thickness is considered abnormal for a post-menopausal female. Endometrial sampling should be considered to exclude carcinoma. Lesion within the urinary bladder with flow on Doppler imaging worrisome for neoplasm. Recommend consult with urology. Electronically Signed   By: Inge Rise M.D.   On: 11/18/2019 14:55   DG Pelvis Portable  Result Date: 11/18/2019 CLINICAL DATA:   Status post fall.  Pain.  Initial encounter. EXAM: PORTABLE PELVIS 1-2 VIEWS COMPARISON:  None. FINDINGS: There is no evidence of pelvic fracture or diastasis. No pelvic bone lesions are seen. IMPRESSION: Negative exam. Electronically Signed   By: Inge Rise M.D.   On: 11/18/2019 10:16   DG Chest Portable 1 View  Result Date: 11/18/2019 CLINICAL DATA:  Status post fall today. EXAM: PORTABLE CHEST 1 VIEW COMPARISON:  None. FINDINGS: The lungs are clear. Heart size is upper normal. No pneumothorax or pleural effusion. No acute or focal bony abnormality. IMPRESSION: No acute disease. Electronically Signed   By: Inge Rise M.D.   On: 11/18/2019 10:12   DG Knee Complete 4 Views Left  Result Date: 11/18/2019 CLINICAL DATA:  Status post fall. Right knee pain. Initial encounter. EXAM: LEFT KNEE - COMPLETE 4+ VIEW COMPARISON:  None. FINDINGS: There is no acute bony or  joint abnormality. Severe tricompartmental osteoarthritis noted. No joint effusion. IMPRESSION: No acute finding. Severe tricompartmental osteoarthritis. Electronically Signed   By: Inge Rise M.D.   On: 11/18/2019 10:15   DG OR UROLOGY CYSTO IMAGE ([facility] ONLY)  Result Date: 11/20/2019 There is no interpretation for this exam.  This order is for images obtained during a surgical procedure.  Please See "Surgeries" Tab for more information regarding the procedure.   ECHOCARDIOGRAM COMPLETE  Result Date: 11/19/2019    ECHOCARDIOGRAM REPORT   Patient Name:   [facility] Date of Exam: 11/19/2019 Medical Rec #:  [id number]        Height:       65.0 in Accession #:    [id number]       Weight:       292.8 lb Date of Birth:  [birth year]        BSA:          2.326 m Patient Age:    74 years         BP:           110/72 mmHg Patient Gender: F                HR:           89 bpm. Exam Location:  [facility] Procedure: 2D Echo, Cardiac Doppler and Color Doppler Indications:     Abnormal ECG 794.31  History:         Patient has prior history of  Echocardiogram examinations, most                  recent 03/23/2016. Stroke; Risk Factors:Hypertension and                  Diabetes.  Sonographer:     Sherrie Sport RDCS (AE) Referring Phys:  [id number] [facility] AMIN Diagnosing Phys: Harrell Gave End MD  Sonographer Comments: Technically challenging study due to limited acoustic windows, no apical window and no subcostal window. IMPRESSIONS  1. Left ventricular ejection fraction, by estimation, is >55%. The left ventricle has normal function. Left ventricular endocardial border not optimally defined to evaluate regional wall motion. There is severe left ventricular hypertrophy. Left ventricular diastolic function could not be evaluated.  2. Right ventricular systolic function is normal. The right ventricular size is not well visualized. Tricuspid regurgitation signal is inadequate for assessing PA pressure.  3. The mitral valve is grossly normal. Trivial mitral valve regurgitation.  4. The aortic valve is grossly normal. Aortic valve regurgitation not well assessed. FINDINGS  Left Ventricle: Left ventricular ejection fraction, by estimation, is >55%. The left ventricle has normal function. Left ventricular endocardial border not optimally defined to evaluate regional wall motion. The left ventricular internal cavity size was  normal in size. There is severe left ventricular hypertrophy. Left ventricular diastolic function could not be evaluated. Right Ventricle: The right ventricular size is not well visualized. No increase in right ventricular wall thickness. Right ventricular systolic function is normal. Tricuspid regurgitation signal is inadequate for assessing PA pressure. Left Atrium: Left atrial size was not well visualized. Right Atrium: Right atrial size was not well visualized. Pericardium: The pericardium was not well visualized. Mitral Valve: The mitral valve is grossly normal. Mild mitral annular calcification. Trivial mitral valve regurgitation. Tricuspid  Valve: The tricuspid valve is grossly normal. Tricuspid valve regurgitation is trivial. Aortic Valve: The aortic valve is grossly normal. Aortic valve regurgitation not well assessed. Pulmonic Valve: The pulmonic valve was not well  visualized. Pulmonic valve regurgitation is not visualized. No evidence of pulmonic stenosis. Aorta: The aortic root is normal in size and structure. Pulmonary Artery: The pulmonary artery is of normal size. IAS/Shunts: The interatrial septum was not well visualized.  LEFT VENTRICLE PLAX 2D LVIDd:         4.60 cm LVIDs:         2.97 cm LV PW:         1.89 cm LV IVS:        2.25 cm LVOT diam:     2.00 cm LVOT Area:     3.14 cm  LEFT ATRIUM         Index LA diam:    5.70 cm 2.45 cm/m                        PULMONIC VALVE AORTA                 PV Vmax:        0.72 m/s Ao Root diam: 3.50 cm PV Peak grad:   2.1 mmHg                       RVOT Peak grad: 2 mmHg   SHUNTS Systemic Diam: 2.00 cm Nelva Bush MD Electronically signed by Nelva Bush MD Signature Date/Time: 11/19/2019/6:33:33 PM    Final    CT HEMATURIA WORKUP  Addendum Date: 11/20/2019   ADDENDUM REPORT: 11/20/2019 12:05 ADDENDUM: The submitted images are without intravenous contrast. In addition to the above findings, there is an oval, heterogeneous, faintly visualized mass in the inferior aspect of the medial segment of the left lobe of the liver, adjacent to the gallbladder fossa, measuring 4.3 x 3.6 cm in maximum dimensions on image number 31 series 4. This is concerning for a liver metastasis. These results will be called to the ordering clinician or representative by the Radiologist Assistant, and communication documented in the PACS or [employer]. Electronically Signed   By: Claudie Revering M.D.   On: 11/20/2019 12:05   Result Date: 11/20/2019 CLINICAL DATA:  Hematuria.  Weakness. EXAM: CT ABDOMEN AND PELVIS WITHOUT AND WITH CONTRAST TECHNIQUE: Multidetector CT imaging of the abdomen and pelvis was performed  following the standard protocol before and following the bolus administration of intravenous contrast. CONTRAST:  178mL OMNIPAQUE IOHEXOL 300 MG/ML  SOLN COMPARISON:  Pelvic ultrasound obtained yesterday. FINDINGS: Lower chest: Enlarged heart. Minimal pericardial effusion with a maximum thickness of 11 mm. Minimal bibasilar atelectasis or scarring. Hepatobiliary: 2 tiny gallstones in the gallbladder and ill-defined soft tissue density in the dependent portion of the gallbladder. No gallbladder wall thickening or pericholecystic fluid. Unremarkable liver. Pancreas: Unremarkable. No pancreatic ductal dilatation or surrounding inflammatory changes. Spleen: Normal in size without focal abnormality. Adrenals/Urinary Tract: Rounded fat density left adrenal mass measuring 2.0 cm on image number 23 series 2. Normal appearing right adrenal gland. Mild-to-moderate dilatation of the left renal collecting system and ureter to the level of the ureterovesical junction. At that location, there is an irregular, rounded mass in the bladder measuring 3.7 x 3.7 cm on image number 78 series 2 with a small anterior calcification. This mass measures 4.0 cm in length on sagittal image number 75. To the right of midline in the dependent portion of the bladder, there is a 2nd mass measures 3.1 x 1.8 x 1.7 cm on sagittal image number 67 and axial image number 78 series 2. 3  mm right renal calculus and 1.4 cm rounded, medium density mass arising from the lateral aspect of the midportion of the right kidney, measured on image number 28 series 2. Normal appearing right ureter. Stomach/Bowel: Stomach is within normal limits. Appendix appears normal. No evidence of bowel wall thickening, distention, or inflammatory changes. Vascular/Lymphatic: Atheromatous arterial calcifications without aneurysm. No enlarged lymph nodes. Reproductive: Heterogeneous endometrial mass measuring 4.5 x 4.0 cm on image number 60 series 2 and 6.3 cm in length on  sagittal image number 80. No adnexal mass seen. Other: Small amount of subcutaneous air anteriorly on the right, compatible with recent subcutaneous injection. There is also bilateral subcutaneous edema at the level of the lower abdomen, greater on the left. Musculoskeletal: Lumbar and lower thoracic spine degenerative changes. IMPRESSION: 1. 4.0 x 3.7 x 3.7 cm irregular, rounded mass in the bladder on the left, compatible with a primary bladder carcinoma. This is obstructing the distal left ureter causing mild to moderate left hydronephrosis and hydroureter. 2. 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 3. 3 mm nonobstructing right renal calculus and 1.4 cm right renal mass or proteinaceous cyst. 4. 6.3 x 4.5 x 4.0 cm heterogeneous endometrial mass. 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 5. Calcified cholelithiasis and possible noncalcified cholelithiasis or sludge in the gallbladder 6. Minimal pericardial effusion. 7. Cardiomegaly. 8. 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 Electronically Signed: By: Claudie Revering M.D. On: 11/19/2019 14:21    Labs:  CBC: Recent Labs    11/18/19 0919 11/19/19 0418  WBC 13.6* 7.9  HGB 12.4 10.8*  HCT 39.9 34.8*  PLT 279 256    COAGS: No results for input(s): INR, APTT in the last 8760 hours.  BMP: Recent Labs    11/18/19 0919 11/19/19 0418  NA 137 137  K 4.2 3.8  CL 98 101  CO2 26 29  GLUCOSE 207* 139*  BUN 20 19  CALCIUM 8.6* 7.9*  CREATININE 1.27* 1.10*  GFRNONAA 43* 51*  GFRAA 50* 59*    LIVER FUNCTION TESTS: Recent Labs    11/18/19 0919  BILITOT 0.7  AST 23  ALT 12  ALKPHOS 78  PROT 7.6  ALBUMIN 3.5    TUMOR MARKERS: No results for input(s): AFPTM, CEA, CA199, CHROMGRNA in the last 8760 hours.  Assessment and Plan: Liver mass in setting of newly found bladder mass Oncology discussed with Dr. Anselm Pancoast and requests image guided biopsy of liver  mass. Labs pending Risks and benefits of liver mass was discussed with the patient and/or patient's family including, but not limited to bleeding, infection, damage to adjacent structures or low yield requiring additional tests.  All of the questions were answered and there is agreement to proceed.  Consent signed and in chart.    Thank you for this interesting consult.  I greatly enjoyed meeting Frankie Zito and look forward to participating in their care.  A copy of this report was sent to the requesting provider on this date.  Electronically Signed: Ascencion Dike, PA-C 11/21/2019, 3:04 PM   I spent a total of 20 minutes in face to face in clinical consultation, greater than 50% of which was counseling/coordinating care for liver mass bx

## 2019-11-21 NOTE — Progress Notes (Signed)
PT Cancellation Note  Patient Details Name: Brittney Shepard MRN: [id number] DOB: [birth year]   Cancelled Treatment:    Reason Eval/Treat Not Completed: Other (comment). Pending CT of chest at this time. Not appropriate for therapy at this time, will hold and re-attempt tomorrow.    [place] Depaz 11/21/2019, 1:37 PM  Greggory Stallion, PT, DPT [phone]

## 2019-11-21 NOTE — Telephone Encounter (Signed)
I scheduled pt for Wednesday 7/21 at 1:00.  She is currently still in-patient.  Just F.Y.I.

## 2019-11-21 NOTE — Progress Notes (Addendum)
[facility] NAME: Shey Yott    MR#:  [id number]  DATE OF BIRTH:  01/23/1951  SUBJECTIVE:  patientcame in with generalized weakness had stumbled out of her bed and fell. Did not lose consciousness. She has noticed some change in the color for urine. Denies any vaginally bleeding that she can recall. Denies any abdominal pain. No nausea vomiting or fever  Patient doing a lot better. Had some questions regarding her new findings on MRI. A  REVIEW OF SYSTEMS:   Review of Systems  Constitutional: Negative for chills, fever and weight loss.  HENT: Negative for ear discharge, ear pain and nosebleeds.   Eyes: Negative for blurred vision, pain and discharge.  Respiratory: Negative for sputum production, shortness of breath, wheezing and stridor.   Cardiovascular: Negative for chest pain, palpitations, orthopnea and PND.  Gastrointestinal: Negative for abdominal pain, diarrhea, nausea and vomiting.  Genitourinary: Positive for hematuria. Negative for frequency and urgency.  Musculoskeletal: Positive for falls. Negative for back pain and joint pain.  Neurological: Positive for weakness. Negative for sensory change, speech change and focal weakness.  Psychiatric/Behavioral: Negative for depression and hallucinations. The patient is not nervous/anxious.    Tolerating Diet yes Tolerating PT: yes--rec rehab  DRUG ALLERGIES:   Allergies  Allergen Reactions  . Penicillin G Hives  . Sulfa Antibiotics Rash    VITALS:  Blood pressure 101/77, pulse 75, temperature 98 F (36.7 C), temperature source Oral, resp. rate 20, height 5\' 5"  (1.651 m), weight 132.8 kg, SpO2 98 %.  PHYSICAL EXAMINATION:   Physical Exam  GENERAL:  69 y.o.-year-old patient lying in the bed with no acute distress. Obese-morbidly EYES: Pupils equal, round, reactive to light and accommodation. No scleral icterus.   HEENT: Head atraumatic, normocephalic. Oropharynx and  nasopharynx clear.  NECK:  Supple, no jugular venous distention. No thyroid enlargement, no tenderness.  LUNGS: Normal breath sounds bilaterally, no wheezing, rales, rhonchi. No use of accessory muscles of respiration.  CARDIOVASCULAR: S1, S2 normal. No murmurs, rubs, or gallops.  ABDOMEN: Soft, nontender, nondistended. Bowel sounds present. No organomegaly or mass. Abdominal obesity+ FOLEY + EXTREMITIES: No cyanosis, clubbing or edema b/l.   Right tibial shin blister -POA NEUROLOGIC: Cranial nerves II through XII are intact. No focal Motor or sensory deficits b/l.  weak PSYCHIATRIC:  patient is alert and oriented x 3.  SKIN: No obvious rash, lesion, or ulcer.   LABORATORY PANEL:  CBC Recent Labs  Lab 11/19/19 0418  WBC 7.9  HGB 10.8*  HCT 34.8*  PLT 256    Chemistries  Recent Labs  Lab 11/18/19 0919 11/18/19 0919 11/19/19 0418  NA 137   < > 137  K 4.2   < > 3.8  CL 98   < > 101  CO2 26   < > 29  GLUCOSE 207*   < > 139*  BUN 20   < > 19  CREATININE 1.27*   < > 1.10*  CALCIUM 8.6*   < > 7.9*  MG 2.1  --   --   AST 23  --   --   ALT 12  --   --   ALKPHOS 78  --   --   BILITOT 0.7  --   --    < > = values in this interval not displayed.   Cardiac Enzymes No results for input(s): TROPONINI in the last 168 hours. RADIOLOGY:  MR PELVIS W WO CONTRAST  Result Date: 11/21/2019 CLINICAL DATA:  Bladder and endometrial masses., concern for hepatic metastatic disease EXAM: MRI ABDOMEN AND PELVIS WITHOUT AND WITH CONTRAST TECHNIQUE: Multiplanar multisequence MR imaging of the abdomen and pelvis was performed both before and after the administration of intravenous contrast. CONTRAST:  65mL GADAVIST GADOBUTROL 1 MMOL/ML IV SOLN COMPARISON:  CT evaluation of November 19, 2019 FINDINGS: COMBINED FINDINGS FOR BOTH MR ABDOMEN AND PELVIS Lower chest: No effusion or consolidation. Cardiomegaly. Heart is incompletely imaged. Hepatobiliary: Mass in hepatic subsegment IV B along the gallbladder  fossa showing target like appearance measuring approximately 3.9 x 3.1 cm. (Image 48, series 28) mass abuts the gallbladder. Trace pericholecystic fluid. No significant wall enhancement or pericholecystic stranding. No biliary duct dilation. Small satellite lesions seen just inferior to the dominant mass measuring approximately 1 cm (image 57, series 28) Pancreas: Multi cystic changes throughout the pancreas (image 25, series 1812 mm cystic lesion in the head of the pancreas. Between 10 and 15 additional small cystic foci about the pancreas with pancreatic atrophy and mild main duct distension. An example of another lesion is a 13 mm lesion in the uncinate (image 28, series 18) areas without overtly suspicious features otherwise. Spleen:  Spleen normal in size and contour. 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 No sign of hydronephrosis. Small RIGHT renal cyst. Stomach/Bowel: Gastrointestinal tract is normal to the extent evaluated. Study not performed for bowel evaluation. Vascular/Lymphatic: Patent abdominal vasculature with atherosclerotic plaque in the nonaneurysmal abdominal aorta. Retroaortic LEFT renal vein. Celiac nodal enlargement and portacaval nodal enlargement (image 48, series 27) 12 mm portacaval lymph node adjacent to pancreatic head. (Image 90, series 21) 16 mm celiac lymph node.  IVC is patent. No pelvic adenopathy. Pelvic vasculature not well assessed but grossly patent. Reproductive: Thickening and irregularity of the endometrium measuring approximately 4 cm greatest thickness with cystic changes. Myometrial invasion best seen on sagittal image 23, series 35, there is motion on the current exam which limits assessment but suggestion of invasion at least to 50% of the myometrium, oblique post-contrast images raise this question as well but obliquity challenges the ability to determine depth of myometrial invasion Urinary bladder is decompressed, large tumor removed by  trans urethral resection. Assessment of urinary bladder is limited by under distension. Other:  No ascites. Musculoskeletal: Vertebral hemangiomata and endplate degenerative changes. IMPRESSION: 1. Hepatic mass suspicious for metastatic disease. Given other findings outlined below biopsy may be helpful to determine source of metastasis. 2. Endometrial mass with invasive features. Study limited by motion on the current evaluation but imaging suggests myometrial invasion to at least 50% along the anterior uterus. Given above findings depending on results of additional workup; if depth of myometrial invasion will change patient management a repeat evaluation may be helpful in the outpatient setting. 3. Celiac and portacaval nodal enlargement, suspicious for nodal metastases. 4. Assessment of the urinary bladder is limited by under distension large tumor has been resected. Electronically Signed   By: Zetta Bills M.D.   On: 11/21/2019 08:38   MR ABDOMEN W WO CONTRAST  Result Date: 11/21/2019 CLINICAL DATA:  Bladder and endometrial masses., concern for hepatic metastatic disease EXAM: MRI ABDOMEN AND PELVIS WITHOUT AND WITH CONTRAST TECHNIQUE: Multiplanar multisequence MR imaging of the abdomen and pelvis was performed both before and after the administration of intravenous contrast. CONTRAST:  55mL GADAVIST GADOBUTROL 1 MMOL/ML IV SOLN COMPARISON:  CT evaluation of November 19, 2019 FINDINGS: COMBINED FINDINGS FOR BOTH MR ABDOMEN AND PELVIS Lower  chest: No effusion or consolidation. Cardiomegaly. Heart is incompletely imaged. Hepatobiliary: Mass in hepatic subsegment IV B along the gallbladder fossa showing target like appearance measuring approximately 3.9 x 3.1 cm. (Image 48, series 28) mass abuts the gallbladder. Trace pericholecystic fluid. No significant wall enhancement or pericholecystic stranding. No biliary duct dilation. Small satellite lesions seen just inferior to the dominant mass measuring approximately  1 cm (image 57, series 28) Pancreas: Multi cystic changes throughout the pancreas (image 25, series 1812 mm cystic lesion in the head of the pancreas. Between 10 and 15 additional small cystic foci about the pancreas with pancreatic atrophy and mild main duct distension. An example of another lesion is a 13 mm lesion in the uncinate (image 28, series 18) areas without overtly suspicious features otherwise. Spleen:  Spleen normal in size and contour. 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 No sign of hydronephrosis. Small RIGHT renal cyst. Stomach/Bowel: Gastrointestinal tract is normal to the extent evaluated. Study not performed for bowel evaluation. Vascular/Lymphatic: Patent abdominal vasculature with atherosclerotic plaque in the nonaneurysmal abdominal aorta. Retroaortic LEFT renal vein. Celiac nodal enlargement and portacaval nodal enlargement (image 48, series 27) 12 mm portacaval lymph node adjacent to pancreatic head. (Image 90, series 21) 16 mm celiac lymph node.  IVC is patent. No pelvic adenopathy. Pelvic vasculature not well assessed but grossly patent. Reproductive: Thickening and irregularity of the endometrium measuring approximately 4 cm greatest thickness with cystic changes. Myometrial invasion best seen on sagittal image 23, series 35, there is motion on the current exam which limits assessment but suggestion of invasion at least to 50% of the myometrium, oblique post-contrast images raise this question as well but obliquity challenges the ability to determine depth of myometrial invasion Urinary bladder is decompressed, large tumor removed by trans urethral resection. Assessment of urinary bladder is limited by under distension. Other:  No ascites. Musculoskeletal: Vertebral hemangiomata and endplate degenerative changes. IMPRESSION: 1. Hepatic mass suspicious for metastatic disease. Given other findings outlined below biopsy may be helpful to determine source of  metastasis. 2. Endometrial mass with invasive features. Study limited by motion on the current evaluation but imaging suggests myometrial invasion to at least 50% along the anterior uterus. Given above findings depending on results of additional workup; if depth of myometrial invasion will change patient management a repeat evaluation may be helpful in the outpatient setting. 3. Celiac and portacaval nodal enlargement, suspicious for nodal metastases. 4. Assessment of the urinary bladder is limited by under distension large tumor has been resected. Electronically Signed   By: Zetta Bills M.D.   On: 11/21/2019 08:38   DG OR UROLOGY CYSTO IMAGE ([facility] ONLY)  Result Date: 11/20/2019 There is no interpretation for this exam.  This order is for images obtained during a surgical procedure.  Please See "Surgeries" Tab for more information regarding the procedure.   ASSESSMENT AND PLAN:   Marysol Wellnitz is a 69 y.o. female with medical history significant of diabetes mellitus, hypertension and prior stroke without any significant residual deficit was brought to ED after having a fall. Apparently patient fell while sleeping.  She took a turn and fell down from bed around 2:30 AM.  She remained on the floor till 7 AM when she heard her neighbor and called him for help.   Hematuria/?UTI/Bladder mass/lesion  UA with hematuria and pyuria.  History of recurrent urinary infections.  No prior culture found.  She does has some pyuria although remained afebrile and mild leukocytosis which  can also be explained with fall. -IV Ceftriaxone for now -Follow-up urine cultures--multiple species.  -since pt is having urological procedure and h/o UTI's will give tota 5 days IV abxs/ -US pelvis shows new bladder lesion--d/w dr Diamantina Providence  - CT hematuria 1. 4.0 x 3.7 x 3.7 cm irregular, rounded mass in the bladder on the left, compatible with a primary bladder carcinoma. This is obstructing the distal left ureter causing  mild to moderate left hydronephrosis and hydroureter. 2. 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 3. 3 mm nonobstructing right renal calculus and 1.4 cm right renal mass or proteinaceous cyst. 4. 6.3 x 4.5 x 4.0 cm heterogeneous endometrial mass. 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 5. Calcified cholelithiasis and possible noncalcified cholelithiasis or sludge in the gallbladder 6. Minimal pericardial effusion. 7. Cardiomegaly. 8. 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 9.there is an oval, heterogeneous, faintly visualized mass in the inferior aspect of the medial segment of the left lobe of the liver, adjacent to the gallbladder fossa, measuring 4.3 x 3.6 cm in maximum dimensions on image number 31 series 4. This is concerning for a liver metastasis  -Gynecology consult with Dr Georgianne Fick-- endoemetrial biopsy as out pt -7/19>>-pt to underwent cystoscopy with tumor removal and left ureteral stent placement by Dr. Diamantina Providence-- Leave foley in for one week from date of procedure. -7/19>. Oncology consult with Dr Janese Banks --7/20>. MRI abdomen shows liver mass likely metastatic. IR to perform biopsy tomorrow. Further workup will be done by Dr. Janese Banks at the cancer center  A. fib with RVR.(h/o Afib in old records in 2017)  -Patient was on Cardizem and Tenormin at home and never took her medications--believe for HTN - pt not aware of any prior history of abnormal rhythm or A. Fib.  She was admitted in 2017 with altered mental status and A. fib was mentioned in discharge.  Prior EKG done in 2017 with A. Fib.  On discharge summary she was never started on anticoagulation due to concern of microcytic anemia. -Troponin negative and BNP mildly elevated at 246. -CHA2DS2-VASc score of 4. -Restart home dose of Cardizem and atenolol. -Cardiology consult with dr Burt Knack [facility] -Urology recommends holding any anticoagulation due to  hematuria w/u.  -It seems patient is not keen on taking oral anticoagulation.  Falls.  Patient has 2 falls recently while sleeping and rolling over from bed.  No history of leg weakness.  She uses walker to walk around. -PT/OT evaluation--recommends rehab   Increased endometrial thickening.  Found on pelvic ultrasound.  Denies any post menopausal bleeding. -Needs gynecologic evaluation as an outpatient  Hypertension. -Continue home dose of atenolol and Cardizem.  Chronic lower extremity edema.  Echocardiogram done in 7169 with diastolic dysfunction. -Continue home dose of Lasix. -Repeat echocardiogram  Type 2 diabetes.  Was on Metformin at home. - A1c 6.2 -SSI  Morbid obesity. Body mass index is 41.6 kg/m.  DVT prophylaxis: Lovenox Code Status: Full code Family Communication: spoke with  joey--son on the phone Disposition Plan: rehab in 1 to 2 days Consults called: Cardiology, gynecology, urology, oncology Admission status:  inpatient  Patient to get liver lesion biopsy tomorrow. TOC working on discharge planning to rehab patient will get further workup and treatment plan for Dr. Janese Banks oncology at the cancer center.   TOTAL TIME TAKING CARE OF THIS PATIENT: 25 minutes.  >50% time spent on counselling and coordination of care  Note:  This dictation was prepared with Dragon dictation along with smaller phrase technology. Any transcriptional errors that result from this process are unintentional.  Fritzi Mandes M.D    [facility]   CC: Primary care physician; Marguerita Merles, MDPatient ID: Kerrin Mo, female   DOB: Sep 19, 1950, 69 y.o.   MRN: [id number]

## 2019-11-21 NOTE — Consult Note (Signed)
Hematology/Oncology Consult note [facility] Telephone:([phone] [phone]  Patient Care Team: Marguerita Merles, MD as PCP - General (Family Medicine)   Name of the patient: Brittney Shepard  [id number]  [birth year]    Reason for consult: Bladder mass/endometrial mass/liver lesion   Requesting physician: Dr. Fritzi Mandes  Date of visit: 11/21/2019   History of presenting illness- Patient is a 69 year old female With a past medical history significant for hypertension diabetes and prior stroke who was brought to the ER after she had a mechanical fall.  There were concerns of generalized weakness and patient lives alone.  On arrival to the ER patient was found to have A. fib with RVR.  She also had symptoms of hematuria which prompted a CT hematuria work-up which showed 4 x 3.7 x 3.7 irregular rounded mass in the bladder on the left obstructing the left ureter causing mild to moderate left hydronephrosis and hydroureter.  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  6.3 x 4.5 x 4 cm heterogeneous endometrial mass.  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  Pathology is pending.  GYN has also seen her for the endometrial mass and  plan is for outpatient endometrial biopsy.  At baseline patient lives alone and is independent of her ADLs.  Her son Heron Sabins lives about 30 miles away.  ECOG PS- 3  Pain scale- 0   Review of systems- Review of Systems  Constitutional: Positive for malaise/fatigue. Negative for chills, fever and weight loss.  HENT: Negative for congestion, ear discharge and nosebleeds.   Eyes: Negative for blurred vision.  Respiratory: Negative for cough, hemoptysis, sputum  production, shortness of breath and wheezing.   Cardiovascular: Negative for chest pain, palpitations, orthopnea and claudication.  Gastrointestinal: Negative for abdominal pain, blood in stool, constipation, diarrhea, heartburn, melena, nausea and vomiting.  Genitourinary: Positive for hematuria. Negative for dysuria, flank pain, frequency and urgency.  Musculoskeletal: Positive for falls. Negative for back pain, joint pain and myalgias.  Skin: Negative for rash.  Neurological: Negative for dizziness, tingling, focal weakness, seizures, weakness and headaches.  Endo/Heme/Allergies: Does not bruise/bleed easily.  Psychiatric/Behavioral: Negative for depression and suicidal ideas. The patient does not have insomnia.     Allergies  Allergen Reactions  . Penicillin G Hives  . Sulfa Antibiotics Rash    Patient Active Problem List   Diagnosis Date Noted  . Atrial fibrillation ([place]) 11/19/2019  . Hematuria, gross   . Postmenopausal bleeding   . Abnormal endometrial ultrasound   . Atrial fibrillation with RVR ([place]) 11/18/2019  . Bladder mass   . Fall   . Acute cystitis without hematuria   . DNR (do not resuscitate)   . Palliative care by specialist   . Acute respiratory failure with hypoxia and hypercapnia ([place])   . Atrial fibrillation with rapid ventricular response ([place]) 03/20/2016  . Chronic obstructive pulmonary disease with acute exacerbation ([facility])      Past Medical History:  Diagnosis Date  . Diabetes mellitus without complication ([place])   . Hypertension   . Stroke [facility]) 2013     History reviewed. No pertinent surgical history.  Social History   Socioeconomic History  . Marital status:  Widowed    Spouse name: Not on file  . Number of children: Not on file  . Years of education: Not on file  . Highest education level: Not on file  Occupational History  . Not on file  Tobacco Use  . Smoking status: Never Smoker  . Smokeless tobacco: Never Used  Substance and  Sexual Activity  . Alcohol use: No  . Drug use: No  . Sexual activity: Not on file  Other Topics Concern  . Not on file  Social History Narrative  . Not on file   Social Determinants of Health   Financial Resource Strain:   . Difficulty of Paying Living Expenses:   Food Insecurity:   . Worried About Charity fundraiser in the Last Year:   . Arboriculturist in the Last Year:   Transportation Needs:   . Film/video editor (Medical):   Marland Kitchen Lack of Transportation (Non-Medical):   Physical Activity:   . Days of Exercise per Week:   . Minutes of Exercise per Session:   Stress:   . Feeling of Stress :   Social Connections:   . Frequency of Communication with Friends and Family:   . Frequency of Social Gatherings with Friends and Family:   . Attends Religious Services:   . Active Member of Clubs or Organizations:   . Attends Archivist Meetings:   Marland Kitchen Marital Status:   Intimate Partner Violence:   . Fear of Current or Ex-Partner:   . Emotionally Abused:   Marland Kitchen Physically Abused:   . Sexually Abused:      History reviewed. No pertinent family history.   Current Facility-Administered Medications:  .  0.9 %  sodium chloride infusion, , Intravenous, PRN, Billey Co, MD, Stopped at 11/20/19 2050 .  acetaminophen (TYLENOL) tablet 650 mg, 650 mg, Oral, Q6H PRN **OR** acetaminophen (TYLENOL) suppository 650 mg, 650 mg, Rectal, Q6H PRN, Billey Co, MD .  atenolol (TENORMIN) tablet 50 mg, 50 mg, Oral, Daily, Billey Co, MD, 50 mg at 11/21/19 2620 .  cefTRIAXone (ROCEPHIN) 2 g in sodium chloride 0.9 % 100 mL IVPB, 2 g, Intravenous, Q24H, Billey Co, MD, Stopped at 11/20/19 1930 .  Chlorhexidine Gluconate Cloth 2 % PADS 6 each, 6 each, Topical, Daily, Fritzi Mandes, MD, 6 each at 11/21/19 0036 .  diltiazem (CARDIZEM SR) 12 hr capsule 60 mg, 60 mg, Oral, Q12H, Billey Co, MD, 60 mg at 11/21/19 3559 .  furosemide (LASIX) tablet 40 mg, 40 mg, Oral, BID,  Billey Co, MD, 40 mg at 11/21/19 7416 .  insulin aspart (novoLOG) injection 0-15 Units, 0-15 Units, Subcutaneous, TID WC, Billey Co, MD, 3 Units at 11/21/19 0827 .  insulin aspart (novoLOG) injection 0-5 Units, 0-5 Units, Subcutaneous, QHS, Billey Co, MD, 3 Units at 11/20/19 2103 .  ondansetron (ZOFRAN) tablet 4 mg, 4 mg, Oral, Q6H PRN **OR** ondansetron (ZOFRAN) injection 4 mg, 4 mg, Intravenous, Q6H PRN, Nickolas Madrid C, MD .  polyethylene glycol (MIRALAX / GLYCOLAX) packet 17 g, 17 g, Oral, Daily PRN, Nickolas Madrid C, MD .  potassium chloride (KLOR-CON) CR tablet 10 mEq, 10 mEq, Oral, BID, Billey Co, MD, 10 mEq at 11/21/19 3845 .  sodium chloride flush (NS) 0.9 % injection 3 mL, 3 mL, Intravenous, Q12H, Billey Co, MD, 3 mL at 11/21/19 0833   Physical exam:  Vitals:   11/20/19 1927 11/21/19 0029 11/21/19 0034 11/21/19 3646  BP: 119/67 (!) 97/48 114/67 (!) 113/59  Pulse: 64 (!) 102 90 88  Resp: 17  18   Temp: (!) 97.4 F (36.3 C) 98.6 F (37 C) 98.6 F (37 C) (!) 97.4 F (36.3 C)  TempSrc: Oral Oral  Oral  SpO2: 98% 96% 95% 97%  Weight:      Height:       Physical Exam Constitutional:      General: She is not in acute distress.    Appearance: She is obese.  Cardiovascular:     Rate and Rhythm: Normal rate and regular rhythm.     Heart sounds: Normal heart sounds.  Pulmonary:     Effort: Pulmonary effort is normal.     Breath sounds: Normal breath sounds.  Abdominal:     General: Bowel sounds are normal.     Palpations: Abdomen is soft.     Comments: Obese.  Foley in place  Skin:    General: Skin is warm and dry.  Neurological:     Mental Status: She is alert and oriented to person, place, and time.        CMP Latest Ref Rng & Units 11/19/2019  Glucose 70 - 99 mg/dL 139(H)  BUN 8 - 23 mg/dL 19  Creatinine 0.44 - 1.00 mg/dL 1.10(H)  Sodium 135 - 145 mmol/L 137  Potassium 3.5 - 5.1 mmol/L 3.8  Chloride 98 - 111 mmol/L 101  CO2  22 - 32 mmol/L 29  Calcium 8.9 - 10.3 mg/dL 7.9(L)  Total Protein 6.5 - 8.1 g/dL -  Total Bilirubin 0.3 - 1.2 mg/dL -  Alkaline Phos 38 - 126 U/L -  AST 15 - 41 U/L -  ALT 0 - 44 U/L -   CBC Latest Ref Rng & Units 11/19/2019  WBC 4.0 - 10.5 K/uL 7.9  Hemoglobin 12.0 - 15.0 g/dL 10.8(L)  Hematocrit 36 - 46 % 34.8(L)  Platelets 150 - 400 K/uL 256    @IMAGES @  DG Tibia/Fibula Right  Result Date: 11/18/2019 CLINICAL DATA:  Status post fall. Right lower leg pain. Initial encounter. EXAM: RIGHT TIBIA AND FIBULA - 2 VIEW COMPARISON:  None. FINDINGS: No acute bony or joint abnormality. Severe knee and talonavicular osteoarthritis noted. Soft tissues are unremarkable. IMPRESSION: No acute abnormality. Electronically Signed   By: Inge Rise M.D.   On: 11/18/2019 10:16   MR PELVIS W WO CONTRAST  Result Date: 11/21/2019 CLINICAL DATA:  Bladder and endometrial masses., concern for hepatic metastatic disease EXAM: MRI ABDOMEN AND PELVIS WITHOUT AND WITH CONTRAST TECHNIQUE: Multiplanar multisequence MR imaging of the abdomen and pelvis was performed both before and after the administration of intravenous contrast. CONTRAST:  2mL GADAVIST GADOBUTROL 1 MMOL/ML IV SOLN COMPARISON:  CT evaluation of November 19, 2019 FINDINGS: COMBINED FINDINGS FOR BOTH MR ABDOMEN AND PELVIS Lower chest: No effusion or consolidation. Cardiomegaly. Heart is incompletely imaged. Hepatobiliary: Mass in hepatic subsegment IV B along the gallbladder fossa showing target like appearance measuring approximately 3.9 x 3.1 cm. (Image 48, series 28) mass abuts the gallbladder. Trace pericholecystic fluid. No significant wall enhancement or pericholecystic stranding. No biliary duct dilation. Small satellite lesions seen just inferior to the dominant mass measuring approximately 1 cm (image 57, series 28) Pancreas: Multi cystic changes throughout the pancreas (image 25, series 1812 mm cystic lesion in the head of the pancreas. Between 10  and 15 additional small cystic foci about the pancreas with pancreatic atrophy and mild main duct distension. An example of another  lesion is a 13 mm lesion in the uncinate (image 28, series 18) areas without overtly suspicious features otherwise. Spleen:  Spleen normal in size and contour. 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 No sign of hydronephrosis. Small RIGHT renal cyst. Stomach/Bowel: Gastrointestinal tract is normal to the extent evaluated. Study not performed for bowel evaluation. Vascular/Lymphatic: Patent abdominal vasculature with atherosclerotic plaque in the nonaneurysmal abdominal aorta. Retroaortic LEFT renal vein. Celiac nodal enlargement and portacaval nodal enlargement (image 48, series 27) 12 mm portacaval lymph node adjacent to pancreatic head. (Image 90, series 21) 16 mm celiac lymph node.  IVC is patent. No pelvic adenopathy. Pelvic vasculature not well assessed but grossly patent. Reproductive: Thickening and irregularity of the endometrium measuring approximately 4 cm greatest thickness with cystic changes. Myometrial invasion best seen on sagittal image 23, series 35, there is motion on the current exam which limits assessment but suggestion of invasion at least to 50% of the myometrium, oblique post-contrast images raise this question as well but obliquity challenges the ability to determine depth of myometrial invasion Urinary bladder is decompressed, large tumor removed by trans urethral resection. Assessment of urinary bladder is limited by under distension. Other:  No ascites. Musculoskeletal: Vertebral hemangiomata and endplate degenerative changes. IMPRESSION: 1. Hepatic mass suspicious for metastatic disease. Given other findings outlined below biopsy may be helpful to determine source of metastasis. 2. Endometrial mass with invasive features. Study limited by motion on the current evaluation but imaging suggests myometrial invasion to at least 50%  along the anterior uterus. Given above findings depending on results of additional workup; if depth of myometrial invasion will change patient management a repeat evaluation may be helpful in the outpatient setting. 3. Celiac and portacaval nodal enlargement, suspicious for nodal metastases. 4. Assessment of the urinary bladder is limited by under distension large tumor has been resected. Electronically Signed   By: Zetta Bills M.D.   On: 11/21/2019 08:38   MR ABDOMEN W WO CONTRAST  Result Date: 11/21/2019 CLINICAL DATA:  Bladder and endometrial masses., concern for hepatic metastatic disease EXAM: MRI ABDOMEN AND PELVIS WITHOUT AND WITH CONTRAST TECHNIQUE: Multiplanar multisequence MR imaging of the abdomen and pelvis was performed both before and after the administration of intravenous contrast. CONTRAST:  32mL GADAVIST GADOBUTROL 1 MMOL/ML IV SOLN COMPARISON:  CT evaluation of November 19, 2019 FINDINGS: COMBINED FINDINGS FOR BOTH MR ABDOMEN AND PELVIS Lower chest: No effusion or consolidation. Cardiomegaly. Heart is incompletely imaged. Hepatobiliary: Mass in hepatic subsegment IV B along the gallbladder fossa showing target like appearance measuring approximately 3.9 x 3.1 cm. (Image 48, series 28) mass abuts the gallbladder. Trace pericholecystic fluid. No significant wall enhancement or pericholecystic stranding. No biliary duct dilation. Small satellite lesions seen just inferior to the dominant mass measuring approximately 1 cm (image 57, series 28) Pancreas: Multi cystic changes throughout the pancreas (image 25, series 1812 mm cystic lesion in the head of the pancreas. Between 10 and 15 additional small cystic foci about the pancreas with pancreatic atrophy and mild main duct distension. An example of another lesion is a 13 mm lesion in the uncinate (image 28, series 18) areas without overtly suspicious features otherwise. Spleen:  Spleen normal in size and contour. Adrenals/Urinary Tract: LEFT adrenal  myelolipoma measuring 2.1 cm. No sign of hydronephrosis. Small RIGHT renal cyst. Stomach/Bowel: Gastrointestinal tract is normal to the extent evaluated. Study not performed for bowel evaluation. Vascular/Lymphatic: Patent abdominal vasculature with atherosclerotic plaque in the nonaneurysmal abdominal aorta. Retroaortic LEFT  renal vein. Celiac nodal enlargement and portacaval nodal enlargement (image 48, series 27) 12 mm portacaval lymph node adjacent to pancreatic head. (Image 90, series 21) 16 mm celiac lymph node.  IVC is patent. No pelvic adenopathy. Pelvic vasculature not well assessed but grossly patent. Reproductive: Thickening and irregularity of the endometrium measuring approximately 4 cm greatest thickness with cystic changes. Myometrial invasion best seen on sagittal image 23, series 35, there is motion on the current exam which limits assessment but suggestion of invasion at least to 50% of the myometrium, oblique post-contrast images raise this question as well but obliquity challenges the ability to determine depth of myometrial invasion Urinary bladder is decompressed, large tumor removed by trans urethral resection. Assessment of urinary bladder is limited by under distension. Other:  No ascites. Musculoskeletal: Vertebral hemangiomata and endplate degenerative changes. IMPRESSION: 1. Hepatic mass suspicious for metastatic disease. Given other findings outlined below biopsy may be helpful to determine source of metastasis. 2. Endometrial mass with invasive features. Study limited by motion on the current evaluation but imaging suggests myometrial invasion to at least 50% along the anterior uterus. Given above findings depending on results of additional workup; if depth of myometrial invasion will change patient management a repeat evaluation may be helpful in the outpatient setting. 3. Celiac and portacaval nodal enlargement, suspicious for nodal metastases. 4. Assessment of the urinary bladder is  limited by under distension large tumor has been resected. Electronically Signed   By: Zetta Bills M.D.   On: 11/21/2019 08:38   US Pelvis Complete  Result Date: 11/18/2019 CLINICAL DATA:  Postmenopausal patient with vaginal bleeding. EXAM: TRANSABDOMINAL ULTRASOUND OF PELVIS TECHNIQUE: Transabdominal ultrasound examination of the pelvis was performed including evaluation of the uterus, ovaries, adnexal regions, and pelvic cul-de-sac. COMPARISON:  None. FINDINGS: Uterus Measurements: 11.5 x 6.2 x 7.2 cm = volume: 270 mL. No fibroids or other mass visualized. Endometrium Thickness: 3.5 cm.  No focal abnormality visualized. Right ovary Measurements: Not visualized. Left ovary Measurements: Not visualized. Other findings: Imaging of the urinary bladder demonstrates an echogenic lesion within the bladder measuring 4.3 x 2.8 x 6.5 cm. There is flow within the lesion on Doppler imaging. IMPRESSION: Endometrial thickness is considered abnormal for a post-menopausal female. Endometrial sampling should be considered to exclude carcinoma. Lesion within the urinary bladder with flow on Doppler imaging worrisome for neoplasm. Recommend consult with urology. Electronically Signed   By: Inge Rise M.D.   On: 11/18/2019 14:55   DG Pelvis Portable  Result Date: 11/18/2019 CLINICAL DATA:  Status post fall.  Pain.  Initial encounter. EXAM: PORTABLE PELVIS 1-2 VIEWS COMPARISON:  None. FINDINGS: There is no evidence of pelvic fracture or diastasis. No pelvic bone lesions are seen. IMPRESSION: Negative exam. Electronically Signed   By: Inge Rise M.D.   On: 11/18/2019 10:16   DG Chest Portable 1 View  Result Date: 11/18/2019 CLINICAL DATA:  Status post fall today. EXAM: PORTABLE CHEST 1 VIEW COMPARISON:  None. FINDINGS: The lungs are clear. Heart size is upper normal. No pneumothorax or pleural effusion. No acute or focal bony abnormality. IMPRESSION: No acute disease. Electronically Signed   By: Inge Rise M.D.   On: 11/18/2019 10:12   DG Knee Complete 4 Views Left  Result Date: 11/18/2019 CLINICAL DATA:  Status post fall. Right knee pain. Initial encounter. EXAM: LEFT KNEE - COMPLETE 4+ VIEW COMPARISON:  None. FINDINGS: There is no acute bony or joint abnormality. Severe tricompartmental osteoarthritis noted. No joint effusion. IMPRESSION:  No acute finding. Severe tricompartmental osteoarthritis. Electronically Signed   By: Inge Rise M.D.   On: 11/18/2019 10:15   DG OR UROLOGY CYSTO IMAGE ([facility] ONLY)  Result Date: 11/20/2019 There is no interpretation for this exam.  This order is for images obtained during a surgical procedure.  Please See "Surgeries" Tab for more information regarding the procedure.   ECHOCARDIOGRAM COMPLETE  Result Date: 11/19/2019    ECHOCARDIOGRAM REPORT   Patient Name:   [facility] Date of Exam: 11/19/2019 Medical Rec #:  [id number]        Height:       65.0 in Accession #:    [id number]       Weight:       292.8 lb Date of Birth:  11-03-1950        BSA:          2.326 m Patient Age:    34 years         BP:           110/72 mmHg Patient Gender: F                HR:           89 bpm. Exam Location:  [facility] Procedure: 2D Echo, Cardiac Doppler and Color Doppler Indications:     Abnormal ECG 794.31  History:         Patient has prior history of Echocardiogram examinations, most                  recent 03/23/2016. Stroke; Risk Factors:Hypertension and                  Diabetes.  Sonographer:     Sherrie Sport RDCS (AE) Referring Phys:  [id number] [facility] AMIN Diagnosing Phys: Harrell Gave End MD  Sonographer Comments: Technically challenging study due to limited acoustic windows, no apical window and no subcostal window. IMPRESSIONS  1. Left ventricular ejection fraction, by estimation, is >55%. The left ventricle has normal function. Left ventricular endocardial border not optimally defined to evaluate regional wall motion. There is severe left ventricular hypertrophy. Left  ventricular diastolic function could not be evaluated.  2. Right ventricular systolic function is normal. The right ventricular size is not well visualized. Tricuspid regurgitation signal is inadequate for assessing PA pressure.  3. The mitral valve is grossly normal. Trivial mitral valve regurgitation.  4. The aortic valve is grossly normal. Aortic valve regurgitation not well assessed. FINDINGS  Left Ventricle: Left ventricular ejection fraction, by estimation, is >55%. The left ventricle has normal function. Left ventricular endocardial border not optimally defined to evaluate regional wall motion. The left ventricular internal cavity size was  normal in size. There is severe left ventricular hypertrophy. Left ventricular diastolic function could not be evaluated. Right Ventricle: The right ventricular size is not well visualized. No increase in right ventricular wall thickness. Right ventricular systolic function is normal. Tricuspid regurgitation signal is inadequate for assessing PA pressure. Left Atrium: Left atrial size was not well visualized. Right Atrium: Right atrial size was not well visualized. Pericardium: The pericardium was not well visualized. Mitral Valve: The mitral valve is grossly normal. Mild mitral annular calcification. Trivial mitral valve regurgitation. Tricuspid Valve: The tricuspid valve is grossly normal. Tricuspid valve regurgitation is trivial. Aortic Valve: The aortic valve is grossly normal. Aortic valve regurgitation not well assessed. Pulmonic Valve: The pulmonic valve was not well visualized. Pulmonic valve regurgitation is not visualized. No evidence of pulmonic  stenosis. Aorta: The aortic root is normal in size and structure. Pulmonary Artery: The pulmonary artery is of normal size. IAS/Shunts: The interatrial septum was not well visualized.  LEFT VENTRICLE PLAX 2D LVIDd:         4.60 cm LVIDs:         2.97 cm LV PW:         1.89 cm LV IVS:        2.25 cm LVOT diam:     2.00 cm  LVOT Area:     3.14 cm  LEFT ATRIUM         Index LA diam:    5.70 cm 2.45 cm/m                        PULMONIC VALVE AORTA                 PV Vmax:        0.72 m/s Ao Root diam: 3.50 cm PV Peak grad:   2.1 mmHg                       RVOT Peak grad: 2 mmHg   SHUNTS Systemic Diam: 2.00 cm Nelva Bush MD Electronically signed by Nelva Bush MD Signature Date/Time: 11/19/2019/6:33:33 PM    Final    CT HEMATURIA WORKUP  Addendum Date: 11/20/2019   ADDENDUM REPORT: 11/20/2019 12:05 ADDENDUM: The submitted images are without intravenous contrast. In addition to the above findings, there is an oval, heterogeneous, faintly visualized mass in the inferior aspect of the medial segment of the left lobe of the liver, adjacent to the gallbladder fossa, measuring 4.3 x 3.6 cm in maximum dimensions on image number 31 series 4. This is concerning for a liver metastasis. These results will be called to the ordering clinician or representative by the Radiologist Assistant, and communication documented in the PACS or [employer]. Electronically Signed   By: Claudie Revering M.D.   On: 11/20/2019 12:05   Result Date: 11/20/2019 CLINICAL DATA:  Hematuria.  Weakness. EXAM: CT ABDOMEN AND PELVIS WITHOUT AND WITH CONTRAST TECHNIQUE: Multidetector CT imaging of the abdomen and pelvis was performed following the standard protocol before and following the bolus administration of intravenous contrast. CONTRAST:  164mL OMNIPAQUE IOHEXOL 300 MG/ML  SOLN COMPARISON:  Pelvic ultrasound obtained yesterday. FINDINGS: Lower chest: Enlarged heart. Minimal pericardial effusion with a maximum thickness of 11 mm. Minimal bibasilar atelectasis or scarring. Hepatobiliary: 2 tiny gallstones in the gallbladder and ill-defined soft tissue density in the dependent portion of the gallbladder. No gallbladder wall thickening or pericholecystic fluid. Unremarkable liver. Pancreas: Unremarkable. No pancreatic ductal dilatation or surrounding  inflammatory changes. Spleen: Normal in size without focal abnormality. Adrenals/Urinary Tract: Rounded fat density left adrenal mass measuring 2.0 cm on image number 23 series 2. Normal appearing right adrenal gland. Mild-to-moderate dilatation of the left renal collecting system and ureter to the level of the ureterovesical junction. At that location, there is an irregular, rounded mass in the bladder measuring 3.7 x 3.7 cm on image number 78 series 2 with a small anterior calcification. This mass measures 4.0 cm in length on sagittal image number 75. To the right of midline in the dependent portion of the bladder, there is a 2nd mass measures 3.1 x 1.8 x 1.7 cm on sagittal image number 67 and axial image number 78 series 2. 3 mm right renal calculus and 1.4 cm rounded, medium density  mass arising from the lateral aspect of the midportion of the right kidney, measured on image number 28 series 2. Normal appearing right ureter. Stomach/Bowel: Stomach is within normal limits. Appendix appears normal. No evidence of bowel wall thickening, distention, or inflammatory changes. Vascular/Lymphatic: Atheromatous arterial calcifications without aneurysm. No enlarged lymph nodes. Reproductive: Heterogeneous endometrial mass measuring 4.5 x 4.0 cm on image number 60 series 2 and 6.3 cm in length on sagittal image number 80. No adnexal mass seen. Other: Small amount of subcutaneous air anteriorly on the right, compatible with recent subcutaneous injection. There is also bilateral subcutaneous edema at the level of the lower abdomen, greater on the left. Musculoskeletal: Lumbar and lower thoracic spine degenerative changes. IMPRESSION: 1. 4.0 x 3.7 x 3.7 cm irregular, rounded mass in the bladder on the left, compatible with a primary bladder carcinoma. This is obstructing the distal left ureter causing mild to moderate left hydronephrosis and hydroureter. 2. 3.1 x 1.8 x 1.7 cm in the bladder the right of midline, compatible  with a second primary bladder neoplasm. 3. 3 mm nonobstructing right renal calculus and 1.4 cm right renal mass or proteinaceous cyst. 4. 6.3 x 4.5 x 4.0 cm heterogeneous endometrial mass. 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 5. Calcified cholelithiasis and possible noncalcified cholelithiasis or sludge in the gallbladder 6. Minimal pericardial effusion. 7. Cardiomegaly. 8. 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 Electronically Signed: By: Claudie Revering M.D. On: 11/19/2019 14:21    Assessment and plan- Patient is a 69 y.o. 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  Patient presented with hematuria and found to have 2 separate new bladder masses causing ureteral obstruction and is s/p TURBT.  TURBT showed multiple large bladder masses and left ureteral stent is placed.  Pathology is currently pending.  MRI also shows mass in the liver measuring 3.9 x 3.1 cm as well as endometrial thickening 4 cm with myometrial invasion.  Cystic changes in the pancreas.  Celiac and portocaval nodal enlargement.  At this time I would recommend ultrasound-guided liver biopsy to a certain if it is coming from the bladder versus endometrium.  She will be going for that tomorrow.  Patient and son are agreeable with the procedure.  I will also order a CT chest without contrast to complete her staging work-up.  She will be getting endometrial biopsy as an outpatient.  I have ordered a CA-125  Plan at this time is for the patient to go to a rehab.  I will see her as an outpatient to discuss pathology results and further management.  Presently her performance status is poor and it remains to be seen how much of an improvement she can make at the rehab before treatment recommendations can be made   Thank you for this kind referral and the opportunity to participate  in the care of this patient   Visit Diagnosis 1. Bladder mass   2. Atrial fibrillation with rapid ventricular response ([place])   3. Vaginal bleeding   4. Fall, initial encounter   5. Acute cystitis without hematuria   6. Hematuria, gross     Dr. Randa Evens, MD, MPH [facility] [phone] 11/21/2019 3:47 PM

## 2019-11-21 NOTE — Progress Notes (Signed)
Urology Inpatient Progress Note  Subjective: No acute events overnight. Surgical pathology in progress Foley in place draining yellow urine Patient reports slight left flank twinge this morning, now resolved. MR AP with and without contrast performed postoperatively yesterday notable for endometrial mass with features of myometrial invasion, celiac and portacaval nodal enlargement, and hepatic mass concerning for metastatic disease.  Anti-infectives: Anti-infectives (From admission, onward)   Start     Dose/Rate Route Frequency Ordered Stop   11/21/19 1400  cephALEXin (KEFLEX) capsule 500 mg     Discontinue     500 mg Oral Every 8 hours 11/21/19 1002 11/25/19 1359   11/19/19 1800  cefTRIAXone (ROCEPHIN) 2 g in sodium chloride 0.9 % 100 mL IVPB  Status:  Discontinued        2 g 200 mL/hr over 30 Minutes Intravenous Every 24 hours 11/18/19 1826 11/21/19 1002   11/18/19 1700  cefTRIAXone (ROCEPHIN) 2 g in sodium chloride 0.9 % 100 mL IVPB        2 g 200 mL/hr over 30 Minutes Intravenous  Once 11/18/19 1647 11/18/19 1801      Current Facility-Administered Medications  Medication Dose Route Frequency Provider Last Rate Last Admin  . 0.9 %  sodium chloride infusion   Intravenous PRN Billey Co, MD   Stopped at 11/20/19 2050  . acetaminophen (TYLENOL) tablet 650 mg  650 mg Oral Q6H PRN Billey Co, MD       Or  . acetaminophen (TYLENOL) suppository 650 mg  650 mg Rectal Q6H PRN Billey Co, MD      . atenolol (TENORMIN) tablet 50 mg  50 mg Oral Daily Billey Co, MD   50 mg at 11/21/19 0947  . cephALEXin (KEFLEX) capsule 500 mg  500 mg Oral Q8H Fritzi Mandes, MD      . Chlorhexidine Gluconate Cloth 2 % PADS 6 each  6 each Topical Daily Fritzi Mandes, MD   6 each at 11/21/19 0036  . diltiazem (CARDIZEM SR) 12 hr capsule 60 mg  60 mg Oral Q12H Billey Co, MD   60 mg at 11/21/19 0962  . furosemide (LASIX) tablet 40 mg  40 mg Oral BID Billey Co, MD   40 mg at  11/21/19 8366  . insulin aspart (novoLOG) injection 0-15 Units  0-15 Units Subcutaneous TID WC Billey Co, MD   3 Units at 11/21/19 0827  . insulin aspart (novoLOG) injection 0-5 Units  0-5 Units Subcutaneous QHS Billey Co, MD   3 Units at 11/20/19 2103  . ondansetron (ZOFRAN) tablet 4 mg  4 mg Oral Q6H PRN Billey Co, MD       Or  . ondansetron [facility]) injection 4 mg  4 mg Intravenous Q6H PRN Billey Co, MD      . polyethylene glycol (MIRALAX / GLYCOLAX) packet 17 g  17 g Oral Daily PRN Billey Co, MD      . potassium chloride (KLOR-CON) CR tablet 10 mEq  10 mEq Oral BID Billey Co, MD   10 mEq at 11/21/19 0828  . sodium chloride flush (NS) 0.9 % injection 3 mL  3 mL Intravenous Q12H Billey Co, MD   3 mL at 11/21/19 2947   Objective: Vital signs in last 24 hours: Temp:  [96.5 F (35.8 C)-98.6 F (37 C)] 97.8 F (36.6 C) (07/20 0950) Pulse Rate:  [51-105] 90 (07/20 0950) Resp:  [13-20] 20 (07/20 0950) BP: (97-146)/(48-105) 109/62 (  07/20 0950) SpO2:  [94 %-99 %] 96 % (07/20 0950)  Intake/Output from previous day: 07/19 0701 - 07/20 0700 In: 413.6 [I.V.:213.6; IV Piggyback:200] Out: 900 [Urine:900] Intake/Output this shift: Total I/O In: -  Out: 600 [Urine:600]  Physical Exam Vitals and nursing note reviewed.  Constitutional:      General: She is not in acute distress.    Appearance: She is not ill-appearing, toxic-appearing or diaphoretic.  HENT:     Head: Normocephalic and atraumatic.  Pulmonary:     Effort: Pulmonary effort is normal. No respiratory distress.  Skin:    General: Skin is warm and dry.  Neurological:     Mental Status: She is alert and oriented to person, place, and time.  Psychiatric:        Mood and Affect: Mood normal.        Behavior: Behavior normal.    Lab Results:  Recent Labs    11/19/19 0418  WBC 7.9  HGB 10.8*  HCT 34.8*  PLT 256   BMET Recent Labs    11/19/19 0418  NA 137  K 3.8  CL  101  CO2 29  GLUCOSE 139*  BUN 19  CREATININE 1.10*  CALCIUM 7.9*   Studies/Results: MR PELVIS W WO CONTRAST  Result Date: 11/21/2019 CLINICAL DATA:  Bladder and endometrial masses., concern for hepatic metastatic disease EXAM: MRI ABDOMEN AND PELVIS WITHOUT AND WITH CONTRAST TECHNIQUE: Multiplanar multisequence MR imaging of the abdomen and pelvis was performed both before and after the administration of intravenous contrast. CONTRAST:  85mL GADAVIST GADOBUTROL 1 MMOL/ML IV SOLN COMPARISON:  CT evaluation of November 19, 2019 FINDINGS: COMBINED FINDINGS FOR BOTH MR ABDOMEN AND PELVIS Lower chest: No effusion or consolidation. Cardiomegaly. Heart is incompletely imaged. Hepatobiliary: Mass in hepatic subsegment IV B along the gallbladder fossa showing target like appearance measuring approximately 3.9 x 3.1 cm. (Image 48, series 28) mass abuts the gallbladder. Trace pericholecystic fluid. No significant wall enhancement or pericholecystic stranding. No biliary duct dilation. Small satellite lesions seen just inferior to the dominant mass measuring approximately 1 cm (image 57, series 28) Pancreas: Multi cystic changes throughout the pancreas (image 25, series 1812 mm cystic lesion in the head of the pancreas. Between 10 and 15 additional small cystic foci about the pancreas with pancreatic atrophy and mild main duct distension. An example of another lesion is a 13 mm lesion in the uncinate (image 28, series 18) areas without overtly suspicious features otherwise. Spleen:  Spleen normal in size and contour. 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 No sign of hydronephrosis. Small RIGHT renal cyst. Stomach/Bowel: Gastrointestinal tract is normal to the extent evaluated. Study not performed for bowel evaluation. Vascular/Lymphatic: Patent abdominal vasculature with atherosclerotic plaque in the nonaneurysmal abdominal aorta. Retroaortic LEFT renal vein. Celiac nodal enlargement and  portacaval nodal enlargement (image 48, series 27) 12 mm portacaval lymph node adjacent to pancreatic head. (Image 90, series 21) 16 mm celiac lymph node.  IVC is patent. No pelvic adenopathy. Pelvic vasculature not well assessed but grossly patent. Reproductive: Thickening and irregularity of the endometrium measuring approximately 4 cm greatest thickness with cystic changes. Myometrial invasion best seen on sagittal image 23, series 35, there is motion on the current exam which limits assessment but suggestion of invasion at least to 50% of the myometrium, oblique post-contrast images raise this question as well but obliquity challenges the ability to determine depth of myometrial invasion Urinary bladder is decompressed, large tumor removed by trans urethral  resection. Assessment of urinary bladder is limited by under distension. Other:  No ascites. Musculoskeletal: Vertebral hemangiomata and endplate degenerative changes. IMPRESSION: 1. Hepatic mass suspicious for metastatic disease. Given other findings outlined below biopsy may be helpful to determine source of metastasis. 2. Endometrial mass with invasive features. Study limited by motion on the current evaluation but imaging suggests myometrial invasion to at least 50% along the anterior uterus. Given above findings depending on results of additional workup; if depth of myometrial invasion will change patient management a repeat evaluation may be helpful in the outpatient setting. 3. Celiac and portacaval nodal enlargement, suspicious for nodal metastases. 4. Assessment of the urinary bladder is limited by under distension large tumor has been resected. Electronically Signed   By: Zetta Bills M.D.   On: 11/21/2019 08:38   MR ABDOMEN W WO CONTRAST  Result Date: 11/21/2019 CLINICAL DATA:  Bladder and endometrial masses., concern for hepatic metastatic disease EXAM: MRI ABDOMEN AND PELVIS WITHOUT AND WITH CONTRAST TECHNIQUE: Multiplanar multisequence MR  imaging of the abdomen and pelvis was performed both before and after the administration of intravenous contrast. CONTRAST:  75mL GADAVIST GADOBUTROL 1 MMOL/ML IV SOLN COMPARISON:  CT evaluation of November 19, 2019 FINDINGS: COMBINED FINDINGS FOR BOTH MR ABDOMEN AND PELVIS Lower chest: No effusion or consolidation. Cardiomegaly. Heart is incompletely imaged. Hepatobiliary: Mass in hepatic subsegment IV B along the gallbladder fossa showing target like appearance measuring approximately 3.9 x 3.1 cm. (Image 48, series 28) mass abuts the gallbladder. Trace pericholecystic fluid. No significant wall enhancement or pericholecystic stranding. No biliary duct dilation. Small satellite lesions seen just inferior to the dominant mass measuring approximately 1 cm (image 57, series 28) Pancreas: Multi cystic changes throughout the pancreas (image 25, series 1812 mm cystic lesion in the head of the pancreas. Between 10 and 15 additional small cystic foci about the pancreas with pancreatic atrophy and mild main duct distension. An example of another lesion is a 13 mm lesion in the uncinate (image 28, series 18) areas without overtly suspicious features otherwise. Spleen:  Spleen normal in size and contour. 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 No sign of hydronephrosis. Small RIGHT renal cyst. Stomach/Bowel: Gastrointestinal tract is normal to the extent evaluated. Study not performed for bowel evaluation. Vascular/Lymphatic: Patent abdominal vasculature with atherosclerotic plaque in the nonaneurysmal abdominal aorta. Retroaortic LEFT renal vein. Celiac nodal enlargement and portacaval nodal enlargement (image 48, series 27) 12 mm portacaval lymph node adjacent to pancreatic head. (Image 90, series 21) 16 mm celiac lymph node.  IVC is patent. No pelvic adenopathy. Pelvic vasculature not well assessed but grossly patent. Reproductive: Thickening and irregularity of the endometrium measuring  approximately 4 cm greatest thickness with cystic changes. Myometrial invasion best seen on sagittal image 23, series 35, there is motion on the current exam which limits assessment but suggestion of invasion at least to 50% of the myometrium, oblique post-contrast images raise this question as well but obliquity challenges the ability to determine depth of myometrial invasion Urinary bladder is decompressed, large tumor removed by trans urethral resection. Assessment of urinary bladder is limited by under distension. Other:  No ascites. Musculoskeletal: Vertebral hemangiomata and endplate degenerative changes. IMPRESSION: 1. Hepatic mass suspicious for metastatic disease. Given other findings outlined below biopsy may be helpful to determine source of metastasis. 2. Endometrial mass with invasive features. Study limited by motion on the current evaluation but imaging suggests myometrial invasion to at least 50% along the anterior uterus. Given above  findings depending on results of additional workup; if depth of myometrial invasion will change patient management a repeat evaluation may be helpful in the outpatient setting. 3. Celiac and portacaval nodal enlargement, suspicious for nodal metastases. 4. Assessment of the urinary bladder is limited by under distension large tumor has been resected. Electronically Signed   By: Zetta Bills M.D.   On: 11/21/2019 08:38   Assessment & Plan: 69 year old comorbid female POD1 from TURBT and left ureteral stent placement with Dr. Diamantina Providence for management of a large bladder mass and left hydronephrosis due to tumor obstruction. Intraoperative findings notable for multiple papillary tumors measuring up to 6cm in size within the bladder. Tumor was completely resected and sent for pathology; left ureteral stent placed without difficulty.  Patient is tolerating stent well today, urine has cleared postoperatively. Contrast MR findings concerning for metastatic disease with  hepatic, bladder, nodal, and endometrial involvement, primary/primaries unclear.  I reviewed typical stent symptoms with the patient today including flank pain, groin pain, gross hematuria, dysuria, urgency, and frequency. She expressed understanding. As she is tolerating stent well today, ok to defer anticholinergics.  Plan: -Keep Foley x1 week for bladder healing -Follow surgical pathology with outpatient visit to discuss results  Debroah Loop, PA-C 11/21/2019

## 2019-11-21 NOTE — TOC Progression Note (Addendum)
Transition of Care [facility]) - Progression Note    Patient Details  Name: Brittney Shepard MRN: [id number] Date of Birth: [birth year]  Transition of [facility] CM/SW [place], LCSW Phone Number: 11/21/2019, 10:19 AM  Clinical Narrative: CSW met with patient and provided list of bed offers: [facility], [facility], and [facility]. Patient not interested in any of them. [place] is first preference but they declined. CSW left message for admissions coordinator to check reason and if they can review again. [facility] is second preference (will send referral) and [facility] is third preference (haven't responded to referral yet).    12:22 pm: [facility] will only be able to offer her a bed if oncology follow up appts are scheduled for after she finishes rehab. This is because they would have to cover the cost of the appts, even if it is just for follow up. CSW spoke with [facility] admissions coordinator. This would not be an issue for them if it is just follow up appts. Admissions coordinator will review referral to see if they can offer a bed.  1:38 pm: [facility] is able to offer a bed.  Expected Discharge Plan: [place] Barriers to Discharge: Continued Medical Work up  Expected Discharge Plan and Services Expected Discharge Plan: [place] In-house Referral: Clinical Social Work Discharge Planning Services: CM Consult Post Acute Care Choice: [place] arrangements for the past 2 months: Single Family Home                                       Social Determinants of Health (SDOH) Interventions    Readmission Risk Interventions No flowsheet data found.

## 2019-11-21 NOTE — Progress Notes (Signed)
Progress Note  Patient Name: Brittney Shepard Date of Encounter: 11/21/2019  Primary Cardiologist:[facility], Dr. Saunders Revel rounding  Subjective   S/p urology cytoscopy and L ureteral stent placement.  Still asx with Afib. No chest pain or SOB.  Inpatient Medications    Scheduled Meds: . atenolol  50 mg Oral Daily  . Chlorhexidine Gluconate Cloth  6 each Topical Daily  . diltiazem  60 mg Oral Q12H  . furosemide  40 mg Oral BID  . insulin aspart  0-15 Units Subcutaneous TID WC  . insulin aspart  0-5 Units Subcutaneous QHS  . potassium chloride  10 mEq Oral BID  . sodium chloride flush  3 mL Intravenous Q12H   Continuous Infusions: . sodium chloride Stopped (11/20/19 2050)  . cefTRIAXone (ROCEPHIN)  IV Stopped (11/20/19 1930)   PRN Meds: sodium chloride, acetaminophen **OR** acetaminophen, ondansetron **OR** ondansetron (ZOFRAN) IV, polyethylene glycol   Vital Signs    Vitals:   11/20/19 1927 11/21/19 0029 11/21/19 0034 11/21/19 0432  BP: 119/67 (!) 97/48 114/67 (!) 113/59  Pulse: 64 (!) 102 90 88  Resp: 17  18   Temp: (!) 97.4 F (36.3 C) 98.6 F (37 C) 98.6 F (37 C) (!) 97.4 F (36.3 C)  TempSrc: Oral Oral  Oral  SpO2: 98% 96% 95% 97%  Weight:      Height:        Intake/Output Summary (Last 24 hours) at 11/21/2019 0758 Last data filed at 11/21/2019 0315 Gross per 24 hour  Intake 413.55 ml  Output 900 ml  Net -486.45 ml   Last 3 Weights 11/18/2019 11/18/2019 03/20/2016  Weight (lbs) 292 lb 12.3 oz 250 lb 353 lb 3.2 oz  Weight (kg) 132.8 kg 113.399 kg 160.21 kg      Telemetry    Afib - Personally Reviewed  ECG    No new tracings - Personally Reviewed  Physical Exam   GEN: No acute distress.   Neck: JVD difficult to assess due to body habitus Cardiac: IRIR, no murmurs, rubs, or gallops.  Respiratory: Anteriorly CTAB. GI: Obese, nontender, non-distended  MS: Trace bilateral edema; No deformity. Neuro:  Nonfocal  Psych: Normal affect   Labs     High Sensitivity Troponin:   Recent Labs  Lab 11/18/19 0919 11/18/19 1306  TROPONINIHS 7 7      Chemistry Recent Labs  Lab 11/18/19 0919 11/19/19 0418  NA 137 137  K 4.2 3.8  CL 98 101  CO2 26 29  GLUCOSE 207* 139*  BUN 20 19  CREATININE 1.27* 1.10*  CALCIUM 8.6* 7.9*  PROT 7.6  --   ALBUMIN 3.5  --   AST 23  --   ALT 12  --   ALKPHOS 78  --   BILITOT 0.7  --   GFRNONAA 43* 51*  GFRAA 50* 59*  ANIONGAP 13 7     Hematology Recent Labs  Lab 11/18/19 0919 11/19/19 0418  WBC 13.6* 7.9  RBC 4.54 3.91  HGB 12.4 10.8*  HCT 39.9 34.8*  MCV 87.9 89.0  MCH 27.3 27.6  MCHC 31.1 31.0  RDW 15.1 15.3  PLT 279 256    BNP Recent Labs  Lab 11/18/19 0919  BNP 246.5*     DDimer No results for input(s): DDIMER in the last 168 hours.   Radiology    DG OR UROLOGY CYSTO IMAGE ([place])  Result Date: 11/20/2019 There is no interpretation for this exam.  This order is for images  obtained during a surgical procedure.  Please See "Surgeries" Tab for more information regarding the procedure.   ECHOCARDIOGRAM COMPLETE  Result Date: 11/19/2019    ECHOCARDIOGRAM REPORT   Patient Name:   [name] Date of Exam: 11/19/2019 Medical Rec #:  [id number]        Height:       65.0 in Accession #:    [id number]       Weight:       292.8 lb Date of Birth:  1950-09-14        BSA:          2.326 m Patient Age:    69 years         BP:           110/72 mmHg Patient Gender: F                HR:           89 bpm. Exam Location:  [facility] Procedure: 2D Echo, Cardiac Doppler and Color Doppler Indications:     Abnormal ECG 794.31  History:         Patient has prior history of Echocardiogram examinations, most                  recent 03/23/2016. Stroke; Risk Factors:Hypertension and                  Diabetes.  Sonographer:     Sherrie Sport RDCS (AE) Referring Phys:  [id number] [facility] AMIN Diagnosing Phys: Harrell Gave End MD  Sonographer Comments: Technically challenging study due to limited acoustic  windows, no apical window and no subcostal window. IMPRESSIONS  1. Left ventricular ejection fraction, by estimation, is >55%. The left ventricle has normal function. Left ventricular endocardial border not optimally defined to evaluate regional wall motion. There is severe left ventricular hypertrophy. Left ventricular diastolic function could not be evaluated.  2. Right ventricular systolic function is normal. The right ventricular size is not well visualized. Tricuspid regurgitation signal is inadequate for assessing PA pressure.  3. The mitral valve is grossly normal. Trivial mitral valve regurgitation.  4. The aortic valve is grossly normal. Aortic valve regurgitation not well assessed. FINDINGS  Left Ventricle: Left ventricular ejection fraction, by estimation, is >55%. The left ventricle has normal function. Left ventricular endocardial border not optimally defined to evaluate regional wall motion. The left ventricular internal cavity size was  normal in size. There is severe left ventricular hypertrophy. Left ventricular diastolic function could not be evaluated. Right Ventricle: The right ventricular size is not well visualized. No increase in right ventricular wall thickness. Right ventricular systolic function is normal. Tricuspid regurgitation signal is inadequate for assessing PA pressure. Left Atrium: Left atrial size was not well visualized. Right Atrium: Right atrial size was not well visualized. Pericardium: The pericardium was not well visualized. Mitral Valve: The mitral valve is grossly normal. Mild mitral annular calcification. Trivial mitral valve regurgitation. Tricuspid Valve: The tricuspid valve is grossly normal. Tricuspid valve regurgitation is trivial. Aortic Valve: The aortic valve is grossly normal. Aortic valve regurgitation not well assessed. Pulmonic Valve: The pulmonic valve was not well visualized. Pulmonic valve regurgitation is not visualized. No evidence of pulmonic stenosis.  Aorta: The aortic root is normal in size and structure. Pulmonary Artery: The pulmonary artery is of normal size. IAS/Shunts: The interatrial septum was not well visualized.  LEFT VENTRICLE PLAX 2D LVIDd:         4.60 cm LVIDs:  2.97 cm LV PW:         1.89 cm LV IVS:        2.25 cm LVOT diam:     2.00 cm LVOT Area:     3.14 cm  LEFT ATRIUM         Index LA diam:    5.70 cm 2.45 cm/m                        PULMONIC VALVE AORTA                 PV Vmax:        0.72 m/s Ao Root diam: 3.50 cm PV Peak grad:   2.1 mmHg                       RVOT Peak grad: 2 mmHg   SHUNTS Systemic Diam: 2.00 cm Nelva Bush MD Electronically signed by Nelva Bush MD Signature Date/Time: 11/19/2019/6:33:33 PM    Final    CT HEMATURIA WORKUP  Addendum Date: 11/20/2019   ADDENDUM REPORT: 11/20/2019 12:05 ADDENDUM: The submitted images are without intravenous contrast. In addition to the above findings, there is an oval, heterogeneous, faintly visualized mass in the inferior aspect of the medial segment of the left lobe of the liver, adjacent to the gallbladder fossa, measuring 4.3 x 3.6 cm in maximum dimensions on image number 31 series 4. This is concerning for a liver metastasis. These results will be called to the ordering clinician or representative by the Radiologist Assistant, and communication documented in the PACS or [employer]. Electronically Signed   By: Claudie Revering M.D.   On: 11/20/2019 12:05   Result Date: 11/20/2019 CLINICAL DATA:  Hematuria.  Weakness. EXAM: CT ABDOMEN AND PELVIS WITHOUT AND WITH CONTRAST TECHNIQUE: Multidetector CT imaging of the abdomen and pelvis was performed following the standard protocol before and following the bolus administration of intravenous contrast. CONTRAST:  127mL OMNIPAQUE IOHEXOL 300 MG/ML  SOLN COMPARISON:  Pelvic ultrasound obtained yesterday. FINDINGS: Lower chest: Enlarged heart. Minimal pericardial effusion with a maximum thickness of 11 mm. Minimal bibasilar  atelectasis or scarring. Hepatobiliary: 2 tiny gallstones in the gallbladder and ill-defined soft tissue density in the dependent portion of the gallbladder. No gallbladder wall thickening or pericholecystic fluid. Unremarkable liver. Pancreas: Unremarkable. No pancreatic ductal dilatation or surrounding inflammatory changes. Spleen: Normal in size without focal abnormality. Adrenals/Urinary Tract: Rounded fat density left adrenal mass measuring 2.0 cm on image number 23 series 2. Normal appearing right adrenal gland. Mild-to-moderate dilatation of the left renal collecting system and ureter to the level of the ureterovesical junction. At that location, there is an irregular, rounded mass in the bladder measuring 3.7 x 3.7 cm on image number 78 series 2 with a small anterior calcification. This mass measures 4.0 cm in length on sagittal image number 75. To the right of midline in the dependent portion of the bladder, there is a 2nd mass measures 3.1 x 1.8 x 1.7 cm on sagittal image number 67 and axial image number 78 series 2. 3 mm right renal calculus and 1.4 cm rounded, medium density mass arising from the lateral aspect of the midportion of the right kidney, measured on image number 28 series 2. Normal appearing right ureter. Stomach/Bowel: Stomach is within normal limits. Appendix appears normal. No evidence of bowel wall thickening, distention, or inflammatory changes. Vascular/Lymphatic: Atheromatous arterial calcifications without aneurysm. No enlarged lymph nodes. Reproductive:  Heterogeneous endometrial mass measuring 4.5 x 4.0 cm on image number 60 series 2 and 6.3 cm in length on sagittal image number 80. No adnexal mass seen. Other: Small amount of subcutaneous air anteriorly on the right, compatible with recent subcutaneous injection. There is also bilateral subcutaneous edema at the level of the lower abdomen, greater on the left. Musculoskeletal: Lumbar and lower thoracic spine degenerative changes.  IMPRESSION: 1. 4.0 x 3.7 x 3.7 cm irregular, rounded mass in the bladder on the left, compatible with a primary bladder carcinoma. This is obstructing the distal left ureter causing mild to moderate left hydronephrosis and hydroureter. 2. 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 3. 3 mm nonobstructing right renal calculus and 1.4 cm right renal mass or proteinaceous cyst. 4. 6.3 x 4.5 x 4.0 cm heterogeneous endometrial mass. 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 5. Calcified cholelithiasis and possible noncalcified cholelithiasis or sludge in the gallbladder 6. Minimal pericardial effusion. 7. Cardiomegaly. 8. 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 Electronically Signed: By: Claudie Revering M.D. On: 11/19/2019 14:21    Cardiac Studies   Echo 11/20/19 1. Left ventricular ejection fraction, by estimation, is >55%. The left  ventricle has normal function. Left ventricular endocardial border not  optimally defined to evaluate regional wall motion. There is severe left  ventricular hypertrophy. Left  ventricular diastolic function could not be evaluated.  2. Right ventricular systolic function is normal. The right ventricular  size is not well visualized. Tricuspid regurgitation signal is inadequate  for assessing PA pressure.  3. The mitral valve is grossly normal. Trivial mitral valve  regurgitation.  4. The aortic valve is grossly normal. Aortic valve regurgitation not  well assessed.   Patient Profile     69 y.o. female with a history of HTN, DM2, atrial fibrillation, and who is being seen today for the evaluation of atrial fibrillation with RVR.  Assessment & Plan    Atrial fibrillation with RVR, permanent --Asx. Suspected to have been in atrial fibrillation for several years. --Most recent echo above with EF normal greater than 55%.  Severe LVH.   --Recommend ongoing rate control with goal ventricular  rate below 110bpm. Continue diltiazem, atenolol. Currently rate controlled. --Not on chronic anticoagulation with a CHA2DS2VASc score of at least 6 (HTN, age, DM2, strokex2, female). On discussion, she declines any future plans of anticoagulation. At this time, reasonable to defer anticoagulation in the setting of bladder tumors, possible liver lesion, and likely ongoing procedures. No plan for DCCV or TEE/DCCV.   Hypertension --BP well controlled. Continue current medications.   Bladder cancer, possible liver lesion --Per IM/Urology/pathology. Anticoagulation deferred as above. As noted in urology documentation, high concern for muscle invasion based on intraoperative findings and hydronephrosis.   For questions or updates, please contact [place] Please consult [email or web address] for contact info under        Signed, Arvil Chaco, PA-C  11/21/2019, 7:58 AM

## 2019-11-22 ENCOUNTER — Ambulatory Visit: Payer: Self-pay | Admitting: Urology

## 2019-11-22 ENCOUNTER — Other Ambulatory Visit: Payer: Self-pay | Admitting: Anatomic Pathology & Clinical Pathology

## 2019-11-22 ENCOUNTER — Inpatient Hospital Stay: Payer: Medicare Other

## 2019-11-22 ENCOUNTER — Other Ambulatory Visit: Payer: Medicare Other

## 2019-11-22 DIAGNOSIS — N3289 Other specified disorders of bladder: Secondary | ICD-10-CM | POA: Diagnosis not present

## 2019-11-22 DIAGNOSIS — I4819 Other persistent atrial fibrillation: Secondary | ICD-10-CM | POA: Diagnosis not present

## 2019-11-22 DIAGNOSIS — I10 Essential (primary) hypertension: Secondary | ICD-10-CM

## 2019-11-22 DIAGNOSIS — R531 Weakness: Secondary | ICD-10-CM

## 2019-11-22 DIAGNOSIS — E119 Type 2 diabetes mellitus without complications: Secondary | ICD-10-CM

## 2019-11-22 DIAGNOSIS — C787 Secondary malignant neoplasm of liver and intrahepatic bile duct: Secondary | ICD-10-CM

## 2019-11-22 DIAGNOSIS — C679 Malignant neoplasm of bladder, unspecified: Secondary | ICD-10-CM

## 2019-11-22 LAB — PROTIME-INR
INR: 1.2 (ref 0.8–1.2)
Prothrombin Time: 14.8 seconds (ref 11.4–15.2)

## 2019-11-22 LAB — BASIC METABOLIC PANEL
Anion gap: 12 (ref 5–15)
BUN: 26 mg/dL — ABNORMAL HIGH (ref 8–23)
CO2: 28 mmol/L (ref 22–32)
Calcium: 8.1 mg/dL — ABNORMAL LOW (ref 8.9–10.3)
Chloride: 96 mmol/L — ABNORMAL LOW (ref 98–111)
Creatinine, Ser: 0.95 mg/dL (ref 0.44–1.00)
GFR calc Af Amer: >60 mL/min (ref >60)
GFR calc non Af Amer: >60 mL/min (ref >60)
Glucose, Bld: 141 mg/dL — ABNORMAL HIGH (ref 70–99)
Potassium: 4 mmol/L (ref 3.5–5.1)
Sodium: 136 mmol/L (ref 135–145)

## 2019-11-22 LAB — GLUCOSE, CAPILLARY
Glucose-Capillary: 131 mg/dL — ABNORMAL HIGH (ref 70–99)
Glucose-Capillary: 117 mg/dL — ABNORMAL HIGH (ref 70–99)
Glucose-Capillary: 113 mg/dL — ABNORMAL HIGH (ref 70–99)
Glucose-Capillary: 163 mg/dL — ABNORMAL HIGH (ref 70–99)
Glucose-Capillary: 125 mg/dL — ABNORMAL HIGH (ref 70–99)

## 2019-11-22 LAB — SURGICAL PATHOLOGY

## 2019-11-22 LAB — SARS CORONAVIRUS 2 BY RT PCR (HOSPITAL ORDER, PERFORMED IN ~~LOC~~ HOSPITAL LAB): SARS Coronavirus 2: NEGATIVE

## 2019-11-22 MED ORDER — MIDAZOLAM HCL 5 MG/5ML IJ SOLN
INTRAMUSCULAR | Status: AC
  Filled 2019-11-22: qty 5

## 2019-11-22 MED ORDER — FENTANYL CITRATE (PF) 100 MCG/2ML IJ SOLN
INTRAMUSCULAR | Status: AC | PRN
  Administered 2019-11-22: 50 ug via INTRAVENOUS

## 2019-11-22 MED ORDER — MIDAZOLAM HCL 5 MG/5ML IJ SOLN
INTRAMUSCULAR | Status: AC | PRN
  Administered 2019-11-22: 1 mg via INTRAVENOUS

## 2019-11-22 MED ORDER — FENTANYL CITRATE (PF) 100 MCG/2ML IJ SOLN
INTRAMUSCULAR | Status: AC
  Filled 2019-11-22: qty 2

## 2019-11-22 NOTE — Progress Notes (Signed)
Patient ID: Howard Patton, female   DOB: 10-28-1950, 69 y.o.   MRN: [id number] [facility] PROGRESS NOTE  Kameela Leipold HCW:[id number] DOB: 16-Dec-1950 DOA: 11/18/2019 PCP: Marguerita Merles, MD  HPI/Subjective: Patient had liver biopsy today.  Admitted initially with a fall.  She was found to have atrial fibrillation with rapid ventricular response and a UTI.  Objective: Vitals:   11/22/19 1229 11/22/19 1242  BP: 120/77 133/82  Pulse: 84 92  Resp: 20 20  Temp:  97.9 F (36.6 C)  SpO2: 96% 97%    Intake/Output Summary (Last 24 hours) at 11/22/2019 1730 Last data filed at 11/22/2019 0513 Gross per 24 hour  Intake 240 ml  Output 3800 ml  Net -3560 ml   Filed Weights   11/18/19 0907 11/18/19 1920 11/22/19 1045  Weight: 113.4 kg 132.8 kg 132.8 kg    ROS: Review of Systems  Respiratory: Negative for shortness of breath.   Cardiovascular: Negative for chest pain.  Gastrointestinal: Negative for abdominal pain.   Exam: Physical Exam HENT:     Nose: No mucosal edema.     Mouth/Throat:     Pharynx: No oropharyngeal exudate.  Eyes:     General: Lids are normal.     Conjunctiva/sclera: Conjunctivae normal.     Pupils: Pupils are equal, round, and reactive to light.  Cardiovascular:     Rate and Rhythm: Normal rate. Rhythm irregularly irregular.     Heart sounds: Normal heart sounds, S1 normal and S2 normal.  Pulmonary:     Breath sounds: No decreased breath sounds, wheezing, rhonchi or rales.  Abdominal:     Palpations: Abdomen is soft.     Tenderness: There is no abdominal tenderness.  Musculoskeletal:     Right lower leg: Swelling present.     Left lower leg: Swelling present.  Skin:    General: Skin is warm.     Findings: No rash.  Neurological:     Mental Status: She is alert and oriented to person, place, and time.     Cranial Nerves: No cranial nerve deficit.       Data Reviewed: Basic Metabolic Panel: Recent Labs  Lab 11/18/19 0919  11/19/19 0418 11/22/19 0535  NA 137 137 136  K 4.2 3.8 4.0  CL 98 101 96*  CO2 26 29 28   GLUCOSE 207* 139* 141*  BUN 20 19 26*  CREATININE 1.27* 1.10* 0.95  CALCIUM 8.6* 7.9* 8.1*  MG 2.1  --   --    Liver Function Tests: Recent Labs  Lab 11/18/19 0919  AST 23  ALT 12  ALKPHOS 78  BILITOT 0.7  PROT 7.6  ALBUMIN 3.5   CBC: Recent Labs  Lab 11/18/19 0919 11/19/19 0418  WBC 13.6* 7.9  NEUTROABS 11.5*  --   HGB 12.4 10.8*  HCT 39.9 34.8*  MCV 87.9 89.0  PLT 279 256   Cardiac Enzymes: Recent Labs  Lab 11/18/19 0919  CKTOTAL 66   BNP (last 3 results) Recent Labs    11/18/19 0919  BNP 246.5*     CBG: Recent Labs  Lab 11/21/19 2103 11/22/19 0806 11/22/19 1050 11/22/19 1256 11/22/19 1704  GLUCAP 202* 131* 117* 113* 163*    Recent Results (from the past 240 hour(s))  Urine culture     Status: Abnormal   Collection Time: 11/18/19  3:33 PM   Specimen: Urine, Clean Catch  Result Value Ref Range Status   Specimen Description   Final  URINE, CLEAN CATCH Performed at [facility], [address] [place], [place] [address]    Special Requests   Final    NONE Performed at [facility], [address] [place], [place] [address]    Culture MULTIPLE SPECIES PRESENT, SUGGEST RECOLLECTION (A)  Final   Report Status 11/19/2019 FINAL  Final  SARS Coronavirus 2 by RT PCR (hospital order, performed in [facility] hospital lab) Nasopharyngeal Nasopharyngeal Swab     Status: None   Collection Time: 11/18/19  5:59 PM   Specimen: Nasopharyngeal Swab  Result Value Ref Range Status   SARS Coronavirus 2 NEGATIVE NEGATIVE Final    Comment: (NOTE) SARS-CoV-2 target nucleic acids are NOT DETECTED.  The SARS-CoV-2 RNA is generally detectable in upper and lower respiratory specimens during the acute phase of infection. The lowest concentration of SARS-CoV-2 viral copies this assay can detect is 250 copies / mL. A negative result does not  preclude SARS-CoV-2 infection and should not be used as the sole basis for treatment or other patient management decisions.  A negative result may occur with improper specimen collection / handling, submission of specimen other than nasopharyngeal swab, presence of viral mutation(s) within the areas targeted by this assay, and inadequate number of viral copies (<250 copies / mL). A negative result must be combined with clinical observations, patient history, and epidemiological information.  Fact Sheet for Patients:   StrictlyIdeas.no  Fact Sheet for Healthcare Providers: BankingDealers.co.za  This test is not yet approved or  cleared by the [place] FDA and has been authorized for detection and/or diagnosis of SARS-CoV-2 by FDA under an Emergency Use Authorization (EUA).  This EUA will remain in effect (meaning this test can be used) for the duration of the COVID-19 declaration under Section 564(b)(1) of the Act, 21 U.S.C. section 360bbb-3(b)(1), unless the authorization is terminated or revoked sooner.  Performed at [facility], [address], [place], [place] [address]      Studies: CT CHEST WO CONTRAST  Result Date: 11/21/2019 CLINICAL DATA:  Inpatient. New diagnosis of bladder cancer. Endometrial mass. Liver mass. Chest staging. EXAM: CT CHEST WITHOUT CONTRAST TECHNIQUE: Multidetector CT imaging of the chest was performed following the standard protocol without IV contrast. COMPARISON:  11/29/2019 MRI abdomen/pelvis. 11/19/2019 CT abdomen/pelvis. 11/18/2019 chest radiograph. FINDINGS: Motion degraded scan, particularly at the lung bases, limiting assessment. Cardiovascular: Mild cardiomegaly. No significant pericardial effusion/thickening. Three-vessel coronary atherosclerosis. Atherosclerotic nonaneurysmal thoracic aorta. Top-normal caliber main pulmonary artery (3 point cm diameter). Mediastinum/Nodes: No discrete  thyroid nodules. Unremarkable esophagus. No axillary adenopathy. Lobulated anterior mediastinal 4.1 x 3.6 cm soft tissue mass (series 2/image 46). Otherwise no pathologically enlarged mediastinal or discrete hilar nodes on these noncontrast images. Lungs/Pleura: No pneumothorax. No pleural effusion. No acute consolidative airspace disease, lung masses or significant pulmonary nodules. 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 Exophytic isodense 1.3 cm renal cortical lesion in the lateral upper right kidney (series 2/image 145), not well characterized on MRI from earlier today due to motion degradation. Musculoskeletal: No aggressive appearing focal osseous lesions. Moderate thoracic spondylosis. IMPRESSION: 1. Limited motion degraded scan. 2. 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 No additional potential findings of metastatic disease in the chest. 3. Mild cardiomegaly. Three-vessel coronary atherosclerosis. 4. Left adrenal myelolipoma. 5. Exophytic isodense 1.3 cm renal cortical lesion in the lateral upper right kidney, not well characterized on MRI from earlier today due to motion degradation.  Suggest attention on follow-up CT or MRI abdomen without and with IV contrast in 6-12 months. 6. Aortic Atherosclerosis (ICD10-I70.0). Electronically Signed   By: Ilona Sorrel M.D.   On: 11/21/2019 16:24   MR PELVIS W WO CONTRAST  Result Date: 11/21/2019 CLINICAL DATA:  Bladder and endometrial masses., concern for hepatic metastatic disease EXAM: MRI ABDOMEN AND PELVIS WITHOUT AND WITH CONTRAST TECHNIQUE: Multiplanar multisequence MR imaging of the abdomen and pelvis was performed both before and after the administration of intravenous contrast. CONTRAST:  15mL GADAVIST GADOBUTROL 1 MMOL/ML IV SOLN COMPARISON:  CT evaluation of November 19, 2019 FINDINGS: COMBINED FINDINGS FOR BOTH MR ABDOMEN AND PELVIS Lower chest: No effusion or  consolidation. Cardiomegaly. Heart is incompletely imaged. Hepatobiliary: Mass in hepatic subsegment IV B along the gallbladder fossa showing target like appearance measuring approximately 3.9 x 3.1 cm. (Image 48, series 28) mass abuts the gallbladder. Trace pericholecystic fluid. No significant wall enhancement or pericholecystic stranding. No biliary duct dilation. Small satellite lesions seen just inferior to the dominant mass measuring approximately 1 cm (image 57, series 28) Pancreas: Multi cystic changes throughout the pancreas (image 25, series 1812 mm cystic lesion in the head of the pancreas. Between 10 and 15 additional small cystic foci about the pancreas with pancreatic atrophy and mild main duct distension. An example of another lesion is a 13 mm lesion in the uncinate (image 28, series 18) areas without overtly suspicious features otherwise. Spleen:  Spleen normal in size and contour. 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 No sign of hydronephrosis. Small RIGHT renal cyst. Stomach/Bowel: Gastrointestinal tract is normal to the extent evaluated. Study not performed for bowel evaluation. Vascular/Lymphatic: Patent abdominal vasculature with atherosclerotic plaque in the nonaneurysmal abdominal aorta. Retroaortic LEFT renal vein. Celiac nodal enlargement and portacaval nodal enlargement (image 48, series 27) 12 mm portacaval lymph node adjacent to pancreatic head. (Image 90, series 21) 16 mm celiac lymph node.  IVC is patent. No pelvic adenopathy. Pelvic vasculature not well assessed but grossly patent. Reproductive: Thickening and irregularity of the endometrium measuring approximately 4 cm greatest thickness with cystic changes. Myometrial invasion best seen on sagittal image 23, series 35, there is motion on the current exam which limits assessment but suggestion of invasion at least to 50% of the myometrium, oblique post-contrast images raise this question as well but  obliquity challenges the ability to determine depth of myometrial invasion Urinary bladder is decompressed, large tumor removed by trans urethral resection. Assessment of urinary bladder is limited by under distension. Other:  No ascites. Musculoskeletal: Vertebral hemangiomata and endplate degenerative changes. IMPRESSION: 1. Hepatic mass suspicious for metastatic disease. Given other findings outlined below biopsy may be helpful to determine source of metastasis. 2. Endometrial mass with invasive features. Study limited by motion on the current evaluation but imaging suggests myometrial invasion to at least 50% along the anterior uterus. Given above findings depending on results of additional workup; if depth of myometrial invasion will change patient management a repeat evaluation may be helpful in the outpatient setting. 3. Celiac and portacaval nodal enlargement, suspicious for nodal metastases. 4. Assessment of the urinary bladder is limited by under distension large tumor has been resected. Electronically Signed   By: Zetta Bills M.D.   On: 11/21/2019 08:38   MR ABDOMEN W WO CONTRAST  Result Date: 11/21/2019 CLINICAL DATA:  Bladder and endometrial masses., concern for hepatic metastatic disease EXAM: MRI ABDOMEN AND PELVIS WITHOUT AND WITH CONTRAST TECHNIQUE: Multiplanar multisequence MR imaging of  the abdomen and pelvis was performed both before and after the administration of intravenous contrast. CONTRAST:  77mL GADAVIST GADOBUTROL 1 MMOL/ML IV SOLN COMPARISON:  CT evaluation of November 19, 2019 FINDINGS: COMBINED FINDINGS FOR BOTH MR ABDOMEN AND PELVIS Lower chest: No effusion or consolidation. Cardiomegaly. Heart is incompletely imaged. Hepatobiliary: Mass in hepatic subsegment IV B along the gallbladder fossa showing target like appearance measuring approximately 3.9 x 3.1 cm. (Image 48, series 28) mass abuts the gallbladder. Trace pericholecystic fluid. No significant wall enhancement or  pericholecystic stranding. No biliary duct dilation. Small satellite lesions seen just inferior to the dominant mass measuring approximately 1 cm (image 57, series 28) Pancreas: Multi cystic changes throughout the pancreas (image 25, series 1812 mm cystic lesion in the head of the pancreas. Between 10 and 15 additional small cystic foci about the pancreas with pancreatic atrophy and mild main duct distension. An example of another lesion is a 13 mm lesion in the uncinate (image 28, series 18) areas without overtly suspicious features otherwise. Spleen:  Spleen normal in size and contour. 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 No sign of hydronephrosis. Small RIGHT renal cyst. Stomach/Bowel: Gastrointestinal tract is normal to the extent evaluated. Study not performed for bowel evaluation. Vascular/Lymphatic: Patent abdominal vasculature with atherosclerotic plaque in the nonaneurysmal abdominal aorta. Retroaortic LEFT renal vein. Celiac nodal enlargement and portacaval nodal enlargement (image 48, series 27) 12 mm portacaval lymph node adjacent to pancreatic head. (Image 90, series 21) 16 mm celiac lymph node.  IVC is patent. No pelvic adenopathy. Pelvic vasculature not well assessed but grossly patent. Reproductive: Thickening and irregularity of the endometrium measuring approximately 4 cm greatest thickness with cystic changes. Myometrial invasion best seen on sagittal image 23, series 35, there is motion on the current exam which limits assessment but suggestion of invasion at least to 50% of the myometrium, oblique post-contrast images raise this question as well but obliquity challenges the ability to determine depth of myometrial invasion Urinary bladder is decompressed, large tumor removed by trans urethral resection. Assessment of urinary bladder is limited by under distension. Other:  No ascites. Musculoskeletal: Vertebral hemangiomata and endplate degenerative changes.  IMPRESSION: 1. Hepatic mass suspicious for metastatic disease. Given other findings outlined below biopsy may be helpful to determine source of metastasis. 2. Endometrial mass with invasive features. Study limited by motion on the current evaluation but imaging suggests myometrial invasion to at least 50% along the anterior uterus. Given above findings depending on results of additional workup; if depth of myometrial invasion will change patient management a repeat evaluation may be helpful in the outpatient setting. 3. Celiac and portacaval nodal enlargement, suspicious for nodal metastases. 4. Assessment of the urinary bladder is limited by under distension large tumor has been resected. Electronically Signed   By: Zetta Bills M.D.   On: 11/21/2019 08:38   US BIOPSY (LIVER)  Result Date: 11/22/2019 INDICATION: 69 year old female with a history of liver mass, possible gynecologic malignancy Mets EXAM: ULTRASOUND-GUIDED LIVER MASS BIOPSY MEDICATIONS: None. ANESTHESIA/SEDATION: Moderate (conscious) sedation was employed during this procedure. A total of Versed 1.0 mg and Fentanyl 50 mcg was administered intravenously. Moderate Sedation Time: 10 minutes. The patient's level of consciousness and vital signs were monitored continuously by radiology nursing throughout the procedure under my direct supervision. FLUOROSCOPY TIME:  Ultrasound COMPLICATIONS: None PROCEDURE: Informed written consent was obtained from the patient after a thorough discussion of the procedural risks, benefits and alternatives. All questions were addressed. Maximal Sterile Barrier  Technique was utilized including caps, mask, sterile gowns, sterile gloves, sterile drape, hand hygiene and skin antiseptic. A timeout was performed prior to the initiation of the procedure. Ultrasound survey of the liver performed with images stored and sent to PACs. The epigastric region was prepped with chlorhexidine in a sterile fashion, and a sterile drape  was applied covering the operative field. A sterile gown and sterile gloves were used for the procedure. Local anesthesia was provided with 1% Lidocaine. The patient was prepped and draped sterilely and the skin and subcutaneous tissues were generously infiltrated with 1% lidocaine. A 17 gauge introducer needle was then advanced under ultrasound guidance in sub costal location into the liver, targeting the hypoechoic mass. The stylet was removed, and multiple separate 18 gauge core biopsy were retrieved. Samples were placed into formalin for transportation to the lab. Gel-Foam pledgets were then infused with a small amount of saline for assistance with hemostasis. The needle was removed, and a final ultrasound image was performed. The patient tolerated the procedure well and remained hemodynamically stable throughout. No complications were encountered and no significant blood loss was encounter. IMPRESSION: Status post ultrasound-guided biopsy of liver mass Signed, Dulcy Fanny. Dellia Nims, RPVI [facility] Electronically Signed   By: Corrie Mckusick D.O.   On: 11/22/2019 12:25    Scheduled Meds: . atenolol  50 mg Oral Daily  . cephALEXin  500 mg Oral Q8H  . Chlorhexidine Gluconate Cloth  6 each Topical Daily  . diltiazem  60 mg Oral Q12H  . furosemide  40 mg Oral BID  . insulin aspart  0-15 Units Subcutaneous TID WC  . insulin aspart  0-5 Units Subcutaneous QHS  . potassium chloride  10 mEq Oral BID  . sodium chloride flush  3 mL Intravenous Q12H   Continuous Infusions: . sodium chloride Stopped (11/20/19 2050)    Assessment/Plan:  1. 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  Biopsy today of the liver to see if this is a metastatic lesion.  Will need to follow-up with oncology as outpatient.  Left ureteral stent placement. 2. Persistent atrial fibrillation on diltiazem and atenolol.  Not on anticoagulation secondary to  recent bladder tumor and biopsy. 3. Falls and weakness.  Physical therapy recommends rehab 4. Increased endometrial thickening and MRI suggestive of endometrial mass.  May end up needing gynecological follow-up as outpatient.  Follow-up liver biopsy results. 5. Essential hypertension on atenolol and Cardizem 6. Type 2 diabetes mellitus diet control 7. Morbid obesity with a BMI of 48.72.     Code Status:     Code Status Orders  (From admission, onward)         Start     Ordered   11/18/19 1747  Full code  Continuous        11/18/19 1748        Code Status History    Date Active Date Inactive Code Status Order ID Comments User Context   03/20/2016 1733 03/27/2016 2140 DNR [id number]  Bettey Costa, MD Inpatient   Advance Care Planning Activity     Family Communication: Spoke with son on the phone Disposition Plan: Status is: Inpatient  Dispo: The patient is from: Home              Anticipated d/c is to: Rehab              Anticipated d/c date is: 11/23/2019  Patient currently being diagnosed with bladder cancer.  Biopsy today of the liver to see if this is metastasized from the bladder or from the endometrium.  Consultants:  Urology  Oncology  Cardiology  Time spent: 28 minutes  [place]

## 2019-11-22 NOTE — Care Management Important Message (Signed)
Important Message  Patient Details  Name: Brittney Shepard MRN: [id number] Date of Birth: 29-Mar-1951   Medicare Important Message Given:  Yes     [name] 11/22/2019, 11:49 AM

## 2019-11-22 NOTE — Procedures (Signed)
Interventional Radiology Procedure Note  Procedure: US guided liver mass biopsy. Complications: None Recommendations:  - Ok to shower tomorrow - Do not submerge for 7 days - Routine care   Signed,  Dulcy Fanny. Earleen Newport, DO

## 2019-11-22 NOTE — Progress Notes (Addendum)
   Subjective IR biopsy of liver mass today Denies any flank or abdominal pain  Physical Exam: BP 133/82 (BP Location: Right Arm)   Pulse 92   Temp 97.9 F (36.6 C) (Oral)   Resp 20   Ht 5\' 5"  (1.651 m)   Wt 132.8 kg   SpO2 97%   BMI 48.72 kg/m    Constitutional:  Alert and oriented, No acute distress. Drains: foley with yellow urine  Laboratory Data: Reviewed Renal function normalized after stent placement Pathology from TURBT showing HG T1 urothelial cell carcinoma, muscle was present and not involved   Assessment & Plan:   69 yo F admitted after a fall and found to have multiple large bladder tumors on CT, as well as suspected uterine malignancy, and 3 cm liver mass worrisome for metastatic disease of unclear primary. POD#2 from TURBT and left ureteral stent placement.  Pathology shows high-grade T1 urothelial cell carcinoma, and muscle was present and not involved.  Follow-up liver biopsy pathology Maintain Foley 1 week from TURBT for bladder healing in the setting of extensive resection Further treatment for bladder cancer pending liver biopsy results  I spent 30 total minutes on the floor with greater than 50% spent with the patient in counseling and coordination of care regarding new diagnosis of bladder cancer, and pending liver biopsy results for evaluation of metastatic disease from possibly bladder versus uterine malignancy.   Billey Co, MD

## 2019-11-22 NOTE — Progress Notes (Signed)
Occupational Therapy Treatment Patient Details Name: Brittney Shepard MRN: [id number] DOB: January 25, 1951 Today's Date: 11/22/2019    History of present illness Brittney Shepard is a 69 y.o. female with medical history significant of diabetes mellitus, hypertension and prior stroke without any significant residual deficit was brought to ED after having a fall. Apparently patient fell while sleeping.  She took a turn and fell down from bed around 2:30 AM.  She remained on the floor till 7 AM when she heard her neighbor and called him for help. Patient lives alone and was very concerned going back home stating that she is feeling very weak after this fall and staying on floor for that long time.   OT comments  [name] was seen for OT treatment on this date. Upon arrival to room pt awake/alert, semi-supine in bed, and denying pain. She is agreeable to OT tx session. Pt requesting assistance to wash and comb her hair this date. OT facilitates UB bathing and grooming tasks as described below. See ADL section for additional detail regarding occupational performance. During functional tasks, pt educated on falls prevention strategies and safe use of AE for ADL management. Pt return verbalizes understanding of education provided. Pt making good progress toward goals and continues to benefit from skilled OT services to maximize return to PLOF and minimize risk of future falls, injury, caregiver burden, and readmission. Will continue to follow POC as written. Discharge recommendation remains appropriate.    Follow Up Recommendations  SNF    Equipment Recommendations  Other (comment) (TBD)    Recommendations for Other Services      Precautions / Restrictions Precautions Precautions: Fall Restrictions Weight Bearing Restrictions: No       Mobility Bed Mobility Overal bed mobility: Needs Assistance Bed Mobility: Supine to Sit     Supine to sit: Supervision;HOB elevated     General bed mobility  comments: Pt comes to sitting at EOB with supervision and min cueing for hand placement.  Transfers Overall transfer level: Needs assistance Equipment used: Rolling walker (2 wheeled) Transfers: Sit to/from Stand Sit to Stand: Min assist;From elevated surface         General transfer comment: assist for inititation and achieving upright posture    Balance Overall balance assessment: Needs assistance Sitting-balance support: Feet supported;No upper extremity supported Sitting balance-Leahy Scale: Good Sitting balance - Comments: Steady sitting during static/dynamic tasks.   Standing balance support: Bilateral upper extremity supported;During functional activity Standing balance-Leahy Scale: Fair Standing balance comment: No overt LOB apprecated.                           ADL either performed or assessed with clinical judgement   ADL Overall ADL's : Needs assistance/impaired                                       General ADL Comments: Min A for functional mobility SPT to room recliner. Pt requires min A for  for UB bathing (hair washing) and hair combing this date. Pt noted with increased tangles/matting at back of head and requires assist with detangling this date. She is able to don shampoo cap and use BUE to scrub in shampoo this date. Pt maintains good sitting balance when using BUE for UB tasks, but fatigues quickly with activity this date.     Vision Patient Visual Report: No  change from baseline     Perception     Praxis      Cognition Arousal/Alertness: Awake/alert Behavior During Therapy: WFL for tasks assessed/performed Overall Cognitive Status: Within Functional Limits for tasks assessed                                          Exercises Other Exercises Other Exercises: OT facilitates hair washing using shampoo cap, increased time used to comb pt hair 2/2 significant matting at back of head, and functional mobility.  See ADL section for additional detail regarding occupational performance.   Shoulder Instructions       General Comments Pt vitals remain [facility] t/o session with pt on RA.    Pertinent Vitals/ Pain       Faces Pain Scale: Hurts a little bit Pain Location: Pt endorses some burning at catheter site. Pain Descriptors / Indicators: Burning Pain Intervention(s): Limited activity within patient's tolerance;Monitored during session;Repositioned  Home Living                                          Prior Functioning/Environment              Frequency  Min 1X/week        Progress Toward Goals  OT Goals(current goals can now be found in the care plan section)  Progress towards OT goals: Progressing toward goals  Acute Rehab OT Goals Patient Stated Goal: to get stronger  OT Goal Formulation: With patient Time For Goal Achievement: 12/03/19 Potential to Achieve Goals: [place] Discharge plan remains appropriate    Co-evaluation                 AM-PAC OT "6 Clicks" Daily Activity     Outcome Measure   Help from another person eating meals?: None Help from another person taking care of personal grooming?: A Little Help from another person toileting, which includes using toliet, bedpan, or urinal?: A Lot Help from another person bathing (including washing, rinsing, drying)?: A Lot Help from another person to put on and taking off regular upper body clothing?: A Little Help from another person to put on and taking off regular lower body clothing?: A Lot 6 Click Score: 16    End of Session Equipment Utilized During Treatment: Gait belt;Rolling walker  OT Visit Diagnosis: Other abnormalities of gait and mobility (R26.89);Repeated falls (R29.6)   Activity Tolerance Patient tolerated treatment well   Patient Left in bed;with call bell/phone within reach;with bed alarm set   Nurse Communication          Time: 7564-3329 OT Time Calculation  (min): 33 min  Charges: OT General Charges $OT Visit: 1 Visit OT Treatments $Self Care/Home Management : 23-37 mins  Shara Blazing, M.S., OTR/L Ascom: [phone] 11/22/19, 4:31 PM

## 2019-11-22 NOTE — TOC Progression Note (Addendum)
Transition of Care [facility]) - Progression Note    Patient Details  Name: Brittney Shepard MRN: [id number] Date of Birth: Mar 09, 1951  Transition of Care [facility]) CM/SW [place], LCSW Phone Number: 11/22/2019, 11:58 AM  Clinical Narrative: Patient off unit. RN will notify CSW when she returns so CSW can come by and discuss bed offers.    4:38 pm: Discussed case with Dr. Janese Banks. Patient will follow up with oncology while in rehab. Patient agreeable to bed offer at [facility]. Per MD, likely discharge tomorrow. COVID test has been ordered and insurance authorization started. PT will see her in the morning so we can have an updated note to send to the insurance company.  Expected Discharge Plan: [place] Barriers to Discharge: Continued Medical Work up  Expected Discharge Plan and Services Expected Discharge Plan: [place] In-house Referral: Clinical Social Work Discharge Planning Services: CM Consult Post Acute Care Choice: [place] arrangements for the past 2 months: Single Family Home                                       Social Determinants of Health (SDOH) Interventions    Readmission Risk Interventions No flowsheet data found.

## 2019-11-22 NOTE — Progress Notes (Signed)
PT Cancellation Note  Patient Details Name: Brittney Shepard MRN: [id number] DOB: May 08, 1950   Cancelled Treatment:    Reason Eval/Treat Not Completed: Other (comment) Pt out of room/off the floor at time of attempted treatment. Will re-attempt another time/day when pt available and appropriate.   [place] 11/22/2019, 10:39 AM

## 2019-11-22 NOTE — Progress Notes (Signed)
Carb modified diet ordered as previously ordered. Confirmed  that patient does not have any other procedures scheduled for today that would require a NPO status.

## 2019-11-22 NOTE — Progress Notes (Signed)
Progress Note  Patient Name: Brittney Shepard Date of Encounter: 11/22/2019  [facility] Cardiologist: New, Dr. Saunders Revel   Subjective   No acute events overnight.  Denies chest pain, palpitations or shortness of breath.  Waiting for procedure today.  No bleeding noted in Foley catheter.  Inpatient Medications    Scheduled Meds: . atenolol  50 mg Oral Daily  . cephALEXin  500 mg Oral Q8H  . Chlorhexidine Gluconate Cloth  6 each Topical Daily  . diltiazem  60 mg Oral Q12H  . furosemide  40 mg Oral BID  . insulin aspart  0-15 Units Subcutaneous TID WC  . insulin aspart  0-5 Units Subcutaneous QHS  . potassium chloride  10 mEq Oral BID  . sodium chloride flush  3 mL Intravenous Q12H   Continuous Infusions: . sodium chloride Stopped (11/20/19 2050)   PRN Meds: sodium chloride, acetaminophen **OR** acetaminophen, ondansetron **OR** ondansetron (ZOFRAN) IV, polyethylene glycol   Vital Signs    Vitals:   11/21/19 1157 11/21/19 1931 11/21/19 1935 11/22/19 0403  BP: 101/77 (!) 87/46 118/62 136/78  Pulse: 75 89 89 83  Resp: 20 19    Temp: 98 F (36.7 C) (!) 97.5 F (36.4 C)  (!) 97.4 F (36.3 C)  TempSrc: Oral Oral  Oral  SpO2: 98% 97%  98%  Weight:      Height:        Intake/Output Summary (Last 24 hours) at 11/22/2019 1033 Last data filed at 11/22/2019 0513 Gross per 24 hour  Intake 240 ml  Output 3800 ml  Net -3560 ml   Last 3 Weights 11/18/2019 11/18/2019 03/20/2016  Weight (lbs) 292 lb 12.3 oz 250 lb 353 lb 3.2 oz  Weight (kg) 132.8 kg 113.399 kg 160.21 kg      Telemetry    Atrial fibrillation- Personally Reviewed  ECG    No new tracing- Personally Reviewed  Physical Exam   GEN: No acute distress.  Obese Neck: No JVD Cardiac:  Irregular irregular, no murmurs, rubs, or gallops.  Respiratory:  Decreased breath sounds at bases GI: Soft, nontender, non-distended  MS: No edema; No deformity. Neuro:  Nonfocal  Psych: Normal affect   Labs    High  Sensitivity Troponin:   Recent Labs  Lab 11/18/19 0919 11/18/19 1306  TROPONINIHS 7 7      Chemistry Recent Labs  Lab 11/18/19 0919 11/19/19 0418 11/22/19 0535  NA 137 137 136  K 4.2 3.8 4.0  CL 98 101 96*  CO2 26 29 28   GLUCOSE 207* 139* 141*  BUN 20 19 26*  CREATININE 1.27* 1.10* 0.95  CALCIUM 8.6* 7.9* 8.1*  PROT 7.6  --   --   ALBUMIN 3.5  --   --   AST 23  --   --   ALT 12  --   --   ALKPHOS 78  --   --   BILITOT 0.7  --   --   GFRNONAA 43* 51* >60  GFRAA 50* 59* >60  ANIONGAP 13 7 12      Hematology Recent Labs  Lab 11/18/19 0919 11/19/19 0418  WBC 13.6* 7.9  RBC 4.54 3.91  HGB 12.4 10.8*  HCT 39.9 34.8*  MCV 87.9 89.0  MCH 27.3 27.6  MCHC 31.1 31.0  RDW 15.1 15.3  PLT 279 256    BNP Recent Labs  Lab 11/18/19 0919  BNP 246.5*     DDimer No results for input(s): DDIMER in the last 168  hours.   Radiology    CT CHEST WO CONTRAST  Result Date: 11/21/2019 CLINICAL DATA:  Inpatient. New diagnosis of bladder cancer. Endometrial mass. Liver mass. Chest staging. EXAM: CT CHEST WITHOUT CONTRAST TECHNIQUE: Multidetector CT imaging of the chest was performed following the standard protocol without IV contrast. COMPARISON:  11/29/2019 MRI abdomen/pelvis. 11/19/2019 CT abdomen/pelvis. 11/18/2019 chest radiograph. FINDINGS: Motion degraded scan, particularly at the lung bases, limiting assessment. Cardiovascular: Mild cardiomegaly. No significant pericardial effusion/thickening. Three-vessel coronary atherosclerosis. Atherosclerotic nonaneurysmal thoracic aorta. Top-normal caliber main pulmonary artery (3 point cm diameter). Mediastinum/Nodes: No discrete thyroid nodules. Unremarkable esophagus. No axillary adenopathy. Lobulated anterior mediastinal 4.1 x 3.6 cm soft tissue mass (series 2/image 46). Otherwise no pathologically enlarged mediastinal or discrete hilar nodes on these noncontrast images. Lungs/Pleura: No pneumothorax. No pleural effusion. No acute  consolidative airspace disease, lung masses or significant pulmonary nodules. 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 Exophytic isodense 1.3 cm renal cortical lesion in the lateral upper right kidney (series 2/image 145), not well characterized on MRI from earlier today due to motion degradation. Musculoskeletal: No aggressive appearing focal osseous lesions. Moderate thoracic spondylosis. IMPRESSION: 1. Limited motion degraded scan. 2. 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 No additional potential findings of metastatic disease in the chest. 3. Mild cardiomegaly. Three-vessel coronary atherosclerosis. 4. Left adrenal myelolipoma. 5. Exophytic isodense 1.3 cm renal cortical lesion in the lateral upper right kidney, not well characterized on MRI from earlier today due to motion degradation. Suggest attention on follow-up CT or MRI abdomen without and with IV contrast in 6-12 months. 6. Aortic Atherosclerosis (ICD10-I70.0). Electronically Signed   By: Ilona Sorrel M.D.   On: 11/21/2019 16:24   MR PELVIS W WO CONTRAST  Result Date: 11/21/2019 CLINICAL DATA:  Bladder and endometrial masses., concern for hepatic metastatic disease EXAM: MRI ABDOMEN AND PELVIS WITHOUT AND WITH CONTRAST TECHNIQUE: Multiplanar multisequence MR imaging of the abdomen and pelvis was performed both before and after the administration of intravenous contrast. CONTRAST:  88mL GADAVIST GADOBUTROL 1 MMOL/ML IV SOLN COMPARISON:  CT evaluation of November 19, 2019 FINDINGS: COMBINED FINDINGS FOR BOTH MR ABDOMEN AND PELVIS Lower chest: No effusion or consolidation. Cardiomegaly. Heart is incompletely imaged. Hepatobiliary: Mass in hepatic subsegment IV B along the gallbladder fossa showing target like appearance measuring approximately 3.9 x 3.1 cm. (Image 48, series 28) mass abuts the gallbladder. Trace pericholecystic fluid. No significant wall  enhancement or pericholecystic stranding. No biliary duct dilation. Small satellite lesions seen just inferior to the dominant mass measuring approximately 1 cm (image 57, series 28) Pancreas: Multi cystic changes throughout the pancreas (image 25, series 1812 mm cystic lesion in the head of the pancreas. Between 10 and 15 additional small cystic foci about the pancreas with pancreatic atrophy and mild main duct distension. An example of another lesion is a 13 mm lesion in the uncinate (image 28, series 18) areas without overtly suspicious features otherwise. Spleen:  Spleen normal in size and contour. 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 No sign of hydronephrosis. Small RIGHT renal cyst. Stomach/Bowel: Gastrointestinal tract is normal to the extent evaluated. Study not performed for bowel evaluation. Vascular/Lymphatic: Patent abdominal vasculature with atherosclerotic plaque in the nonaneurysmal abdominal aorta. Retroaortic LEFT renal vein. Celiac nodal enlargement and portacaval nodal enlargement (image 48, series 27) 12 mm portacaval lymph node adjacent to pancreatic head. (Image 90, series 21) 16 mm celiac lymph node.  IVC  is patent. No pelvic adenopathy. Pelvic vasculature not well assessed but grossly patent. Reproductive: Thickening and irregularity of the endometrium measuring approximately 4 cm greatest thickness with cystic changes. Myometrial invasion best seen on sagittal image 23, series 35, there is motion on the current exam which limits assessment but suggestion of invasion at least to 50% of the myometrium, oblique post-contrast images raise this question as well but obliquity challenges the ability to determine depth of myometrial invasion Urinary bladder is decompressed, large tumor removed by trans urethral resection. Assessment of urinary bladder is limited by under distension. Other:  No ascites. Musculoskeletal: Vertebral hemangiomata and endplate degenerative  changes. IMPRESSION: 1. Hepatic mass suspicious for metastatic disease. Given other findings outlined below biopsy may be helpful to determine source of metastasis. 2. Endometrial mass with invasive features. Study limited by motion on the current evaluation but imaging suggests myometrial invasion to at least 50% along the anterior uterus. Given above findings depending on results of additional workup; if depth of myometrial invasion will change patient management a repeat evaluation may be helpful in the outpatient setting. 3. Celiac and portacaval nodal enlargement, suspicious for nodal metastases. 4. Assessment of the urinary bladder is limited by under distension large tumor has been resected. Electronically Signed   By: Zetta Bills M.D.   On: 11/21/2019 08:38   MR ABDOMEN W WO CONTRAST  Result Date: 11/21/2019 CLINICAL DATA:  Bladder and endometrial masses., concern for hepatic metastatic disease EXAM: MRI ABDOMEN AND PELVIS WITHOUT AND WITH CONTRAST TECHNIQUE: Multiplanar multisequence MR imaging of the abdomen and pelvis was performed both before and after the administration of intravenous contrast. CONTRAST:  89mL GADAVIST GADOBUTROL 1 MMOL/ML IV SOLN COMPARISON:  CT evaluation of November 19, 2019 FINDINGS: COMBINED FINDINGS FOR BOTH MR ABDOMEN AND PELVIS Lower chest: No effusion or consolidation. Cardiomegaly. Heart is incompletely imaged. Hepatobiliary: Mass in hepatic subsegment IV B along the gallbladder fossa showing target like appearance measuring approximately 3.9 x 3.1 cm. (Image 48, series 28) mass abuts the gallbladder. Trace pericholecystic fluid. No significant wall enhancement or pericholecystic stranding. No biliary duct dilation. Small satellite lesions seen just inferior to the dominant mass measuring approximately 1 cm (image 57, series 28) Pancreas: Multi cystic changes throughout the pancreas (image 25, series 1812 mm cystic lesion in the head of the pancreas. Between 10 and 15  additional small cystic foci about the pancreas with pancreatic atrophy and mild main duct distension. An example of another lesion is a 13 mm lesion in the uncinate (image 28, series 18) areas without overtly suspicious features otherwise. Spleen:  Spleen normal in size and contour. 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 No sign of hydronephrosis. Small RIGHT renal cyst. Stomach/Bowel: Gastrointestinal tract is normal to the extent evaluated. Study not performed for bowel evaluation. Vascular/Lymphatic: Patent abdominal vasculature with atherosclerotic plaque in the nonaneurysmal abdominal aorta. Retroaortic LEFT renal vein. Celiac nodal enlargement and portacaval nodal enlargement (image 48, series 27) 12 mm portacaval lymph node adjacent to pancreatic head. (Image 90, series 21) 16 mm celiac lymph node.  IVC is patent. No pelvic adenopathy. Pelvic vasculature not well assessed but grossly patent. Reproductive: Thickening and irregularity of the endometrium measuring approximately 4 cm greatest thickness with cystic changes. Myometrial invasion best seen on sagittal image 23, series 35, there is motion on the current exam which limits assessment but suggestion of invasion at least to 50% of the myometrium, oblique post-contrast images raise this question as well but obliquity challenges the  ability to determine depth of myometrial invasion Urinary bladder is decompressed, large tumor removed by trans urethral resection. Assessment of urinary bladder is limited by under distension. Other:  No ascites. Musculoskeletal: Vertebral hemangiomata and endplate degenerative changes. IMPRESSION: 1. Hepatic mass suspicious for metastatic disease. Given other findings outlined below biopsy may be helpful to determine source of metastasis. 2. Endometrial mass with invasive features. Study limited by motion on the current evaluation but imaging suggests myometrial invasion to at least 50% along the  anterior uterus. Given above findings depending on results of additional workup; if depth of myometrial invasion will change patient management a repeat evaluation may be helpful in the outpatient setting. 3. Celiac and portacaval nodal enlargement, suspicious for nodal metastases. 4. Assessment of the urinary bladder is limited by under distension large tumor has been resected. Electronically Signed   By: Zetta Bills M.D.   On: 11/21/2019 08:38   DG OR UROLOGY CYSTO IMAGE ([facility] ONLY)  Result Date: 11/20/2019 There is no interpretation for this exam.  This order is for images obtained during a surgical procedure.  Please See "Surgeries" Tab for more information regarding the procedure.    Cardiac Studies   Echo 11/20/19 1. Left ventricular ejection fraction, by estimation, is >55%. The left  ventricle has normal function. Left ventricular endocardial border not  optimally defined to evaluate regional wall motion. There is severe left  ventricular hypertrophy. Left  ventricular diastolic function could not be evaluated.  2. Right ventricular systolic function is normal. The right ventricular  size is not well visualized. Tricuspid regurgitation signal is inadequate  for assessing PA pressure.  3. The mitral valve is grossly normal. Trivial mitral valve  regurgitation.  4. The aortic valve is grossly normal. Aortic valve regurgitation not  well assessed.   Patient Profile     69 y.o. female with history of hypertension, diabetes, atrial fibrillation, presenting with vaginal bleeding, diagnosed with a bladder mass.  Being seen for fall A. fib RVR  Assessment & Plan    1.  Persistent atrial fibrillation -Heart rate controlled continue diltiazem and atenolol -Not on anticoagulation due to recent bladder tumor, patient undergoing procedure.   -EF normal  2. Hypertension, Blood pressure well controlled.  3.  Bladder tumor, hydronephrosis -Status post ureteral stent -Work-up as  per primary/surgical team  Total encounter time 35 minutes  Greater than 50% was spent in counseling and coordination of care with the patient     Signed, Kate Sable, MD  11/22/2019, 10:33 AM

## 2019-11-23 ENCOUNTER — Telehealth: Payer: Self-pay | Admitting: Obstetrics and Gynecology

## 2019-11-23 ENCOUNTER — Other Ambulatory Visit: Payer: Self-pay | Admitting: Obstetrics and Gynecology

## 2019-11-23 ENCOUNTER — Other Ambulatory Visit (HOSPITAL_COMMUNITY)
Admission: RE | Admit: 2019-11-23 | Discharge: 2019-11-23 | Disposition: A | Discharge status: Home / Self Care | Payer: Medicare Other | Source: Ambulatory Visit | Attending: Obstetrics and Gynecology | Admitting: Obstetrics and Gynecology

## 2019-11-23 DIAGNOSIS — Z124 Encounter for screening for malignant neoplasm of cervix: Secondary | ICD-10-CM

## 2019-11-23 DIAGNOSIS — Z6841 Body Mass Index (BMI) 40.0 and over, adult: Secondary | ICD-10-CM

## 2019-11-23 DIAGNOSIS — N3000 Acute cystitis without hematuria: Secondary | ICD-10-CM

## 2019-11-23 DIAGNOSIS — Z1151 Encounter for screening for human papillomavirus (HPV): Secondary | ICD-10-CM | POA: Insufficient documentation

## 2019-11-23 DIAGNOSIS — C679 Malignant neoplasm of bladder, unspecified: Secondary | ICD-10-CM

## 2019-11-23 DIAGNOSIS — N950 Postmenopausal bleeding: Secondary | ICD-10-CM

## 2019-11-23 DIAGNOSIS — W19XXXA Unspecified fall, initial encounter: Secondary | ICD-10-CM

## 2019-11-23 DIAGNOSIS — C787 Secondary malignant neoplasm of liver and intrahepatic bile duct: Secondary | ICD-10-CM

## 2019-11-23 DIAGNOSIS — R1909 Other intra-abdominal and pelvic swelling, mass and lump: Secondary | ICD-10-CM

## 2019-11-23 LAB — CBC
HCT: 33.6 % — ABNORMAL LOW (ref 36.0–46.0)
Hemoglobin: 11.1 g/dL — ABNORMAL LOW (ref 12.0–15.0)
MCH: 28.3 pg (ref 26.0–34.0)
MCHC: 33 g/dL (ref 30.0–36.0)
MCV: 85.7 fL (ref 80.0–100.0)
Platelets: 299 10*3/uL (ref 150–400)
RBC: 3.92 MIL/uL (ref 3.87–5.11)
RDW: 15.3 % (ref 11.5–15.5)
WBC: 9 10*3/uL (ref 4.0–10.5)
nRBC: 0 % (ref 0.0–0.2)

## 2019-11-23 LAB — BASIC METABOLIC PANEL
Anion gap: 7 (ref 5–15)
BUN: 29 mg/dL — ABNORMAL HIGH (ref 8–23)
CO2: 33 mmol/L — ABNORMAL HIGH (ref 22–32)
Calcium: 7.9 mg/dL — ABNORMAL LOW (ref 8.9–10.3)
Chloride: 99 mmol/L (ref 98–111)
Creatinine, Ser: 1 mg/dL (ref 0.44–1.00)
GFR calc Af Amer: >60 mL/min (ref >60)
GFR calc non Af Amer: 57 mL/min — ABNORMAL LOW (ref >60)
Glucose, Bld: 121 mg/dL — ABNORMAL HIGH (ref 70–99)
Potassium: 3.7 mmol/L (ref 3.5–5.1)
Sodium: 139 mmol/L (ref 135–145)

## 2019-11-23 LAB — GLUCOSE, CAPILLARY
Glucose-Capillary: 111 mg/dL — ABNORMAL HIGH (ref 70–99)
Glucose-Capillary: 143 mg/dL — ABNORMAL HIGH (ref 70–99)

## 2019-11-23 LAB — CA 125: Cancer Antigen (CA) 125: 23.3 U/mL (ref 0.0–38.1)

## 2019-11-23 MED ORDER — CEPHALEXIN 500 MG PO CAPS: 500.0000 mg | ORAL_CAPSULE | Freq: Three times a day (TID) | ORAL | 0 refills | Status: AC

## 2019-11-23 MED ORDER — POLYETHYLENE GLYCOL 3350 17 G PO PACK: 17.0000 g | PACK | Freq: Every day | ORAL | 0 refills | Status: DC | PRN

## 2019-11-23 NOTE — Progress Notes (Signed)
[place] visited pt. per referral via secure chat from [place]; pt. reportedly requesting info re: advanced directives.  Pt. lying in bed when [facility] arrived; pt. shared that she has just been diagnosed w/cancer; 'I can't believe this is happening to me' she said tearfully.  Pt.'s husband died two years ago from prostate cancer; sister, both parents also died of cancer.  Pt. lives alone but son appears to be significant source of support; in conversation, [facility] gained impression that it was the son who had requested info re: advanced directive; pt. thought AD had to do w/assets, property, etc. and continued under this impression despite [place] attempts to explain otherwise.  Pt. requested prayer for healing and for strength to get through this difficult time; [place] lifted pt. and her family up in prayer.  Pt. grateful for [facility] visit and anticipates discharge today to [facility].    11/23/19 1300  Clinical Encounter Type  Visited With Patient;Health care provider  Visit Type Initial;Psychological support;Spiritual support;Social support  Referral From Social work  Spiritual Encounters  Spiritual Needs Emotional;Prayer  Stress Factors  Patient Stress Factors Health changes;Family relationships;Major life changes  Advance Directives (For Healthcare)  Does Patient Have a Medical Advance Directive? No

## 2019-11-23 NOTE — Telephone Encounter (Signed)
-----   Message from Malachy Mood, MD sent at 11/23/2019 12:52 PM EDT ----- Regarding: Endometrial biopsy Lets do Monday 7/26 at 1:10PM

## 2019-11-23 NOTE — TOC Transition Note (Signed)
Transition of Care [facility]) - CM/SW Discharge Note   Patient Details  Name: Brittney Shepard MRN: [id number] Date of Birth: February 14, 1951  Transition of Care [facility]) CM/SW Contact:  Candie Chroman, LCSW Phone Number: 11/23/2019, 1:40 PM   Clinical Narrative: Patient has orders to discharge to [facility] today. RN will call report to [phone] (Room 714). EMS transport has been arranged. Patient is first on the list. No further concerns. CSW signing off.  Final next level of care: Skilled Nursing Facility Barriers to Discharge: Barriers Resolved   Patient Goals and CMS Choice Patient states their goals for this hospitalization and ongoing recovery are:: Get better   Choice offered to / list presented to : Patient  Discharge Placement   Existing PASRR number confirmed : 11/19/19          Patient chooses bed at: [facility] [place] Patient to be transferred to facility by: EMS Name of family member notified: [name] Patient and family notified of of transfer: 11/23/19  Discharge Plan and Services In-house Referral: Clinical Social Work Discharge Planning Services: [employer] Consult Post Acute Care Choice: [place]                               Social Determinants of Health (SDOH) Interventions     Readmission Risk Interventions No flowsheet data found.

## 2019-11-23 NOTE — Consult Note (Signed)
Called regarding patient bladder biopsy which was positive for carincoma.  Bedside pap collected, unable to perform endometrial biopsy will have patient follow up in clinic in the next week to obtain endometrial biopsy.   Malachy Mood, MD, [facility], [facility] 11/23/2019, 12:22 PM

## 2019-11-23 NOTE — Telephone Encounter (Signed)
Called and left voicemail for patient to call back to confirm scheduled appointment

## 2019-11-23 NOTE — Discharge Planning (Signed)
RN attempted to contact [facility] to give report on pt. X2 tries, no answer. DC Summary placed in packet to [facility] to [facility].

## 2019-11-23 NOTE — Discharge Summary (Signed)
[place] [facility] [place] NAME: Brittney Shepard    MR#:  [id number]  DATE OF BIRTH:  1951-04-04  DATE OF ADMISSION:  11/18/2019 ADMITTING PHYSICIAN: Lorella Nimrod, MD  DATE OF DISCHARGE: 11/23/2019  PRIMARY CARE PHYSICIAN: Marguerita Merles, MD    ADMISSION DIAGNOSIS:  Vaginal bleeding [N93.9] Bladder mass [N32.89] Atrial fibrillation with rapid ventricular response ([place]) [I48.91] Acute cystitis without hematuria [N30.00] Atrial fibrillation with RVR ([place]) [I48.91] Fall, initial encounter [W19.XXXA] Atrial fibrillation ([place]) [I48.91]  DISCHARGE DIAGNOSIS:  Active Problems:   Atrial fibrillation with RVR (HCC)   Hematuria, gross   Atrial fibrillation (HCC)   Postmenopausal bleeding   Abnormal endometrial ultrasound   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HCC)   Weakness   Liver metastases (HCC)   Essential hypertension   Type 2 diabetes mellitus without complication, without long-term current use of insulin ([place])   Morbid obesity with BMI of 45.0-49.9, adult ([place])   SECONDARY DIAGNOSIS:   Past Medical History:  Diagnosis Date  . Diabetes mellitus without complication ([place])   . Hypertension   . Stroke [facility]) 2013    HOSPITAL COURSE:   1.  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  Patient had a biopsy of the liver on 11/22/2019 which results are still pending at this time.  We will follow up with oncology and urology as outpatient.  The patient also had a left ureteral stent placement.  Foley catheter to remain in place until 11/27/2019.  If able to get a urology appointment on that date they can remove the Foley at that time or this can be done in the facility.  Follow-up with Dr. Diamantina Providence and Dr. Janese Banks as outpatient. 2.  Persistent atrial fibrillation on diltiazem and atenolol.  No anticoagulation at this time secondary to recent biopsies.  Follow-up with cardiology as outpatient.  Hopefully at that time can start blood thinner.   Higher risk of stroke with now being on anticoagulation 3.  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 4.  Falls and weakness.  Physical therapy recommends rehab. 5.  Increased endometrial thickening and MRI suggestive of endometrial mass.  Will have patient follow-up with gynecology as outpatient.  Follow-up liver biopsy results. 6.  Essential hypertension on atenolol and Cardizem 7.  Type 2 diabetes mellitus.  Can restart Glucophage as outpatient 8.  Morbid obesity with BMI 48.72  DISCHARGE CONDITIONS:   Fair  CONSULTS OBTAINED:  Treatment Team:  Sherren Mocha, MD Billey Co, MD Sindy Guadeloupe, MD  DRUG ALLERGIES:   Allergies  Allergen Reactions  . Penicillin G Hives  . Sulfa Antibiotics Rash    DISCHARGE MEDICATIONS:   Allergies as of 11/23/2019      Reactions   Penicillin G Hives   Sulfa Antibiotics Rash      Medication List    STOP taking these medications   aspirin EC 81 MG tablet   ferrous sulfate 325 (65 FE) MG tablet     TAKE these medications   acetaminophen 325 MG tablet Commonly known as: TYLENOL Take 2 tablets (650 mg total) by mouth every 6 (six) hours as needed for mild pain (or Fever >/= 101).   atenolol 50 MG tablet Commonly known as: TENORMIN Take 1 tablet (50 mg total) by mouth daily.   cephALEXin 500 MG capsule Commonly known as: KEFLEX Take 1 capsule (500 mg total) by mouth every 8 (eight) hours for 2 days.  diltiazem 60 MG 12 hr capsule Commonly known as: CARDIZEM SR Take 1 capsule (60 mg total) by mouth every 12 (twelve) hours.   furosemide 40 MG tablet Commonly known as: Lasix Take 1 tablet (40 mg total) by mouth 2 (two) times daily.   ipratropium-albuterol 0.5-2.5 (3) MG/3ML Soln Commonly known as: DUONEB Take 3 mLs by nebulization 3 (three) times daily.   metFORMIN 500 MG tablet Commonly known as: GLUCOPHAGE Take 500 mg by mouth daily with  breakfast.   polyethylene glycol 17 g packet Commonly known as: MIRALAX / GLYCOLAX Take 17 g by mouth daily as needed for mild constipation.   potassium chloride 10 MEQ tablet Commonly known as: KLOR-CON Take 10 mEq by mouth 2 (two) times daily.   senna-docusate 8.6-50 MG tablet Commonly known as: Senokot-S Take 1 tablet by mouth 2 (two) times daily.        DISCHARGE INSTRUCTIONS:   Follow-up with team at rehab 1 day Follow-up with Dr. Janese Banks oncology 5 days early next week for biopsy of the liver results Follow-up with urology 1 week for Foley removal on 11/27/2019  If you experience worsening of your admission symptoms, develop shortness of breath, life threatening emergency, suicidal or homicidal thoughts you must seek medical attention immediately by calling 911 or calling your MD immediately  if symptoms less severe.  You Must read complete instructions/literature along with all the possible adverse reactions/side effects for all the Medicines you take and that have been prescribed to you. Take any new Medicines after you have completely understood and accept all the possible adverse reactions/side effects.   Please note  You were cared for by a hospitalist during your hospital stay. If you have any questions about your discharge medications or the care you received while you were in the hospital after you are discharged, you can call the unit and asked to speak with the hospitalist on call if the hospitalist that took care of you is not available. Once you are discharged, your primary care physician will handle any further medical issues. Please note that NO REFILLS for any discharge medications will be authorized once you are discharged, as it is imperative that you return to your primary care physician (or establish a relationship with a primary care physician if you do not have one) for your aftercare needs so that they can reassess your need for medications and monitor your lab  values.    Today   CHIEF COMPLAINT:   Chief Complaint  Patient presents with  . Fall    HISTORY OF PRESENT ILLNESS:  Brittney Shepard  is a 69 y.o. female came in after fall and found to be in rapid atrial fibrillation   VITAL SIGNS:  Blood pressure 119/69, pulse 81, temperature 97.8 F (36.6 C), temperature source Oral, resp. rate 20, height 5\' 5"  (1.651 m), weight 132.8 kg, SpO2 97 %.  I/O:    Intake/Output Summary (Last 24 hours) at 11/23/2019 1033 Last data filed at 11/23/2019 0602 Gross per 24 hour  Intake 600 ml  Output 4000 ml  Net -3400 ml    PHYSICAL EXAMINATION:  GENERAL:  69 y.o.-year-old patient lying in the bed with no acute distress.  EYES: Pupils equal, round, reactive to light and accommodation. No scleral icterus. HEENT: Head atraumatic, normocephalic. Oropharynx and nasopharynx clear.   LUNGS: Normal breath sounds bilaterally, no wheezing, rales,rhonchi or crepitation. No use of accessory muscles of respiration.  CARDIOVASCULAR: S1, S2 irregular regular. No murmurs, rubs, or gallops.  ABDOMEN: Soft, non-tende.  EXTREMITIES: 2+ pedal edema.  NEUROLOGIC: Cranial nerves II through XII are intact. Muscle strength 5/5 in all extremities. Sensation intact. Gait not checked.  PSYCHIATRIC: The patient is alert and oriented x 3.  SKIN: No obvious rash, lesion, or ulcer.   DATA REVIEW:   CBC Recent Labs  Lab 11/23/19 0527  WBC 9.0  HGB 11.1*  HCT 33.6*  PLT 299    Chemistries  Recent Labs  Lab 11/18/19 0919 11/19/19 0418 11/23/19 0527  NA 137   < > 139  K 4.2   < > 3.7  CL 98   < > 99  CO2 26   < > 33*  GLUCOSE 207*   < > 121*  BUN 20   < > 29*  CREATININE 1.27*   < > 1.00  CALCIUM 8.6*   < > 7.9*  MG 2.1  --   --   AST 23  --   --   ALT 12  --   --   ALKPHOS 78  --   --   BILITOT 0.7  --   --    < > = values in this interval not displayed.   Microbiology Results  Results for orders placed or performed during the hospital encounter of  11/18/19  Urine culture     Status: Abnormal   Collection Time: 11/18/19  3:33 PM   Specimen: Urine, Clean Catch  Result Value Ref Range Status   Specimen Description   Final    URINE, CLEAN CATCH Performed at [facility], [address], [place], [place] [address]    Special Requests   Final    NONE Performed at [facility], [address] [place], [place] [address]    Culture MULTIPLE SPECIES PRESENT, SUGGEST RECOLLECTION (A)  Final   Report Status 11/19/2019 FINAL  Final  SARS Coronavirus 2 by RT PCR (hospital order, performed in [facility] hospital lab) Nasopharyngeal Nasopharyngeal Swab     Status: None   Collection Time: 11/18/19  5:59 PM   Specimen: Nasopharyngeal Swab  Result Value Ref Range Status   SARS Coronavirus 2 NEGATIVE NEGATIVE Final    Comment: (NOTE) SARS-CoV-2 target nucleic acids are NOT DETECTED.  The SARS-CoV-2 RNA is generally detectable in upper and lower respiratory specimens during the acute phase of infection. The lowest concentration of SARS-CoV-2 viral copies this assay can detect is 250 copies / mL. A negative result does not preclude SARS-CoV-2 infection and should not be used as the sole basis for treatment or other patient management decisions.  A negative result may occur with improper specimen collection / handling, submission of specimen other than nasopharyngeal swab, presence of viral mutation(s) within the areas targeted by this assay, and inadequate number of viral copies (<250 copies / mL). A negative result must be combined with clinical observations, patient history, and epidemiological information.  Fact Sheet for Patients:   StrictlyIdeas.no  Fact Sheet for Healthcare Providers: BankingDealers.co.za  This test is not yet approved or  cleared by the [place] FDA and has been authorized for detection and/or diagnosis of SARS-CoV-2 by FDA under an Emergency  Use Authorization (EUA).  This EUA will remain in effect (meaning this test can be used) for the duration of the COVID-19 declaration under Section 564(b)(1) of the Act, 21 U.S.C. section 360bbb-3(b)(1), unless the authorization is terminated or revoked sooner.  Performed at [facility], [address], [place], [place] [address]  SARS Coronavirus 2 by RT PCR (hospital order, performed in [facility] hospital lab) Nasopharyngeal Nasopharyngeal Swab     Status: None   Collection Time: 11/22/19  5:56 PM   Specimen: Nasopharyngeal Swab  Result Value Ref Range Status   SARS Coronavirus 2 NEGATIVE NEGATIVE Final    Comment: (NOTE) SARS-CoV-2 target nucleic acids are NOT DETECTED.  The SARS-CoV-2 RNA is generally detectable in upper and lower respiratory specimens during the acute phase of infection. The lowest concentration of SARS-CoV-2 viral copies this assay can detect is 250 copies / mL. A negative result does not preclude SARS-CoV-2 infection and should not be used as the sole basis for treatment or other patient management decisions.  A negative result may occur with improper specimen collection / handling, submission of specimen other than nasopharyngeal swab, presence of viral mutation(s) within the areas targeted by this assay, and inadequate number of viral copies (<250 copies / mL). A negative result must be combined with clinical observations, patient history, and epidemiological information.  Fact Sheet for Patients:   StrictlyIdeas.no  Fact Sheet for Healthcare Providers: BankingDealers.co.za  This test is not yet approved or  cleared by the [place] FDA and has been authorized for detection and/or diagnosis of SARS-CoV-2 by FDA under an Emergency Use Authorization (EUA).  This EUA will remain in effect (meaning this test can be used) for the duration of the COVID-19 declaration under Section 564(b)(1) of  the Act, 21 U.S.C. section 360bbb-3(b)(1), unless the authorization is terminated or revoked sooner.  Performed at [facility], [address] [place], [place] [address]     RADIOLOGY:  CT CHEST WO CONTRAST  Result Date: 11/21/2019 CLINICAL DATA:  Inpatient. New diagnosis of bladder cancer. Endometrial mass. Liver mass. Chest staging. EXAM: CT CHEST WITHOUT CONTRAST TECHNIQUE: Multidetector CT imaging of the chest was performed following the standard protocol without IV contrast. COMPARISON:  11/29/2019 MRI abdomen/pelvis. 11/19/2019 CT abdomen/pelvis. 11/18/2019 chest radiograph. FINDINGS: Motion degraded scan, particularly at the lung bases, limiting assessment. Cardiovascular: Mild cardiomegaly. No significant pericardial effusion/thickening. Three-vessel coronary atherosclerosis. Atherosclerotic nonaneurysmal thoracic aorta. Top-normal caliber main pulmonary artery (3 point cm diameter). Mediastinum/Nodes: No discrete thyroid nodules. Unremarkable esophagus. No axillary adenopathy. Lobulated anterior mediastinal 4.1 x 3.6 cm soft tissue mass (series 2/image 46). Otherwise no pathologically enlarged mediastinal or discrete hilar nodes on these noncontrast images. Lungs/Pleura: No pneumothorax. No pleural effusion. No acute consolidative airspace disease, lung masses or significant pulmonary nodules. 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 Exophytic isodense 1.3 cm renal cortical lesion in the lateral upper right kidney (series 2/image 145), not well characterized on MRI from earlier today due to motion degradation. Musculoskeletal: No aggressive appearing focal osseous lesions. Moderate thoracic spondylosis. IMPRESSION: 1. Limited motion degraded scan. 2. 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 No additional potential findings of metastatic disease in the chest. 3. Mild cardiomegaly.  Three-vessel coronary atherosclerosis. 4. Left adrenal myelolipoma. 5. Exophytic isodense 1.3 cm renal cortical lesion in the lateral upper right kidney, not well characterized on MRI from earlier today due to motion degradation. Suggest attention on follow-up CT or MRI abdomen without and with IV contrast in 6-12 months. 6. Aortic Atherosclerosis (ICD10-I70.0). Electronically Signed   By: Ilona Sorrel M.D.   On: 11/21/2019 16:24   US BIOPSY (LIVER)  Result Date: 11/22/2019 INDICATION: 69 year old female with a history of liver mass, possible gynecologic malignancy [place] EXAM: ULTRASOUND-GUIDED LIVER MASS  BIOPSY MEDICATIONS: None. ANESTHESIA/SEDATION: Moderate (conscious) sedation was employed during this procedure. A total of Versed 1.0 mg and Fentanyl 50 mcg was administered intravenously. Moderate Sedation Time: 10 minutes. The patient's level of consciousness and vital signs were monitored continuously by radiology nursing throughout the procedure under my direct supervision. FLUOROSCOPY TIME:  Ultrasound COMPLICATIONS: None PROCEDURE: Informed written consent was obtained from the patient after a thorough discussion of the procedural risks, benefits and alternatives. All questions were addressed. Maximal Sterile Barrier Technique was utilized including caps, mask, sterile gowns, sterile gloves, sterile drape, hand hygiene and skin antiseptic. A timeout was performed prior to the initiation of the procedure. Ultrasound survey of the liver performed with images stored and sent to PACs. The epigastric region was prepped with chlorhexidine in a sterile fashion, and a sterile drape was applied covering the operative field. A sterile gown and sterile gloves were used for the procedure. Local anesthesia was provided with 1% Lidocaine. The patient was prepped and draped sterilely and the skin and subcutaneous tissues were generously infiltrated with 1% lidocaine. A 17 gauge introducer needle was then advanced under  ultrasound guidance in sub costal location into the liver, targeting the hypoechoic mass. The stylet was removed, and multiple separate 18 gauge core biopsy were retrieved. Samples were placed into formalin for transportation to the lab. Gel-Foam pledgets were then infused with a small amount of saline for assistance with hemostasis. The needle was removed, and a final ultrasound image was performed. The patient tolerated the procedure well and remained hemodynamically stable throughout. No complications were encountered and no significant blood loss was encounter. IMPRESSION: Status post ultrasound-guided biopsy of liver mass Signed, Dulcy Fanny. Dellia Nims, RPVI [facility] Radiology Electronically Signed   By: Corrie Mckusick D.O.   On: 11/22/2019 12:25    Management plans discussed with the patient, family and they are in agreement.  CODE STATUS:     Code Status Orders  (From admission, onward)         Start     Ordered   11/18/19 1747  Full code  Continuous        11/18/19 1748        Code Status History    Date Active Date Inactive Code Status Order ID Comments User Context   03/20/2016 1733 03/27/2016 2140 DNR [id number]  Bettey Costa, MD Inpatient   Advance Care Planning Activity      TOTAL TIME TAKING CARE OF THIS PATIENT: 34 minutes.    Loletha Grayer M.D on 11/23/2019 at 10:33 AM  Between 7am to 6pm - Pager - [phone]  After 6pm go to [email or web address] - password [email or web address]  [facility]  CC: Primary care physician; Marguerita Merles, MD

## 2019-11-23 NOTE — Progress Notes (Signed)
Progress Note  Patient Name: Brittney Shepard Date of Encounter: 11/23/2019  Primary Cardiologist: New to [facility] - consult by Burt Knack  Subjective   No chest pain, dyspnea, palpitations, dizziness, presyncope, or syncope.   Inpatient Medications    Scheduled Meds: . atenolol  50 mg Oral Daily  . cephALEXin  500 mg Oral Q8H  . Chlorhexidine Gluconate Cloth  6 each Topical Daily  . diltiazem  60 mg Oral Q12H  . furosemide  40 mg Oral BID  . insulin aspart  0-15 Units Subcutaneous TID WC  . insulin aspart  0-5 Units Subcutaneous QHS  . potassium chloride  10 mEq Oral BID  . sodium chloride flush  3 mL Intravenous Q12H   Continuous Infusions: . sodium chloride Stopped (11/20/19 2050)   PRN Meds: sodium chloride, acetaminophen **OR** acetaminophen, ondansetron **OR** ondansetron (ZOFRAN) IV, polyethylene glycol   Vital Signs    Vitals:   11/22/19 1229 11/22/19 1242 11/22/19 2012 11/23/19 0455  BP: 120/77 133/82 123/69 119/69  Pulse: 84 92 75 81  Resp: 20 20 20 20   Temp:  97.9 F (36.6 C) 97.7 F (36.5 C) 97.8 F (36.6 C)  TempSrc:  Oral Oral Oral  SpO2: 96% 97% 97% 97%  Weight:      Height:        Intake/Output Summary (Last 24 hours) at 11/23/2019 0723 Last data filed at 11/23/2019 0602 Gross per 24 hour  Intake 600 ml  Output 4000 ml  Net -3400 ml   Filed Weights   11/18/19 0907 11/18/19 1920 11/22/19 1045  Weight: 113.4 kg 132.8 kg 132.8 kg    Telemetry    Afib, 80s bpm - Personally Reviewed  ECG    No new tracings - Personally Reviewed  Physical Exam   GEN: No acute distress.   Neck: No JVD. Cardiac: Irregularly irregular, no murmurs, rubs, or gallops.  Respiratory: Clear to auscultation bilaterally.  GI: Soft, nontender, non-distended.   MS: No edema; No deformity. Neuro:  Alert and oriented x 3; Nonfocal.  Psych: Normal affect.  Labs    Chemistry Recent Labs  Lab 11/18/19 0919 11/18/19 0919 11/19/19 0418 11/22/19 0535  11/23/19 0527  NA 137   < > 137 136 139  K 4.2   < > 3.8 4.0 3.7  CL 98   < > 101 96* 99  CO2 26   < > 29 28 33*  GLUCOSE 207*   < > 139* 141* 121*  BUN 20   < > 19 26* 29*  CREATININE 1.27*   < > 1.10* 0.95 1.00  CALCIUM 8.6*   < > 7.9* 8.1* 7.9*  PROT 7.6  --   --   --   --   ALBUMIN 3.5  --   --   --   --   AST 23  --   --   --   --   ALT 12  --   --   --   --   ALKPHOS 78  --   --   --   --   BILITOT 0.7  --   --   --   --   GFRNONAA 43*   < > 51* >60 57*  GFRAA 50*   < > 59* >60 >60  ANIONGAP 13   < > 7 12 7    < > = values in this interval not displayed.     Hematology Recent Labs  Lab 11/18/19 0919 11/19/19 2706  11/23/19 0527  WBC 13.6* 7.9 9.0  RBC 4.54 3.91 3.92  HGB 12.4 10.8* 11.1*  HCT 39.9 34.8* 33.6*  MCV 87.9 89.0 85.7  MCH 27.3 27.6 28.3  MCHC 31.1 31.0 33.0  RDW 15.1 15.3 15.3  PLT 279 256 299    Cardiac EnzymesNo results for input(s): TROPONINI in the last 168 hours. No results for input(s): TROPIPOC in the last 168 hours.   BNP Recent Labs  Lab 11/18/19 0919  BNP 246.5*     DDimer No results for input(s): DDIMER in the last 168 hours.   Radiology    CT CHEST WO CONTRAST  Result Date: 11/21/2019 IMPRESSION: 1. Limited motion degraded scan. 2. 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 No additional potential findings of metastatic disease in the chest. 3. Mild cardiomegaly. Three-vessel coronary atherosclerosis. 4. Left adrenal myelolipoma. 5. Exophytic isodense 1.3 cm renal cortical lesion in the lateral upper right kidney, not well characterized on MRI from earlier today due to motion degradation. Suggest attention on follow-up CT or MRI abdomen without and with IV contrast in 6-12 months. 6. Aortic Atherosclerosis (ICD10-I70.0). Electronically Signed   By: Ilona Sorrel M.D.   On: 11/21/2019 16:24   US BIOPSY (LIVER)  Result Date: 11/22/2019 IMPRESSION:  Status post ultrasound-guided biopsy of liver mass Signed, Dulcy Fanny. Dellia Nims, RPVI [facility] Radiology Electronically Signed   By: Corrie Mckusick D.O.   On: 11/22/2019 12:25    Cardiac Studies   2D echo 11/19/2019: 1. Left ventricular ejection fraction, by estimation, is >55%. The left  ventricle has normal function. Left ventricular endocardial border not  optimally defined to evaluate regional wall motion. There is severe left  ventricular hypertrophy. Left  ventricular diastolic function could not be evaluated.  2. Right ventricular systolic function is normal. The right ventricular  size is not well visualized. Tricuspid regurgitation signal is inadequate  for assessing PA pressure.  3. The mitral valve is grossly normal. Trivial mitral valve  regurgitation.  4. The aortic valve is grossly normal. Aortic valve regurgitation not  well assessed.   Patient Profile     69 y.o. female with history of suspected permanent Afib, DM, HTN who we are seeing for Afib with RVR.  Assessment & Plan    1. Suspected permanent Afib: -Previously noted to be in Afib in 2017, lost to follow up thereafter -Ventricular rates well controlled -Continue PTA atenolol and Cardizem SR -Start [facility] when/if able from a urological/medical perspective given bladder tumor and recent biopsy   -CHADS2VASc 6  2. HTN: -Blood pressure well controlled -Continue medications as above   For questions or updates, please contact [place] Please consult [email or web address] for contact info under Cardiology/STEMI.    Signed, Christell Faith, PA-C [place] Pager: [phone] 11/23/2019, 7:23 AM

## 2019-11-23 NOTE — Progress Notes (Signed)
Hematology/Oncology Consult note [facility]  Telephone:([phone] [phone]  Patient Care Team: Marguerita Merles, MD as PCP - General (Family Medicine)   Name of the patient: Brittney Shepard  [id number]  15-Dec-1950   Date of visit: 11/23/2019  Interval history-[facility]  No further hematuria  ECOG PS- 3 Pain scale- 0   Review of systems- Review of Systems  Constitutional: Positive for malaise/fatigue. Negative for chills, fever and weight loss.  HENT: Negative for congestion, ear discharge and nosebleeds.   Eyes: Negative for blurred vision.  Respiratory: Negative for cough, hemoptysis, sputum production, shortness of breath and wheezing.   Cardiovascular: Negative for chest pain, palpitations, orthopnea and claudication.  Gastrointestinal: Negative for abdominal pain, blood in stool, constipation, diarrhea, heartburn, melena, nausea and vomiting.  Genitourinary: Negative for dysuria, flank pain, frequency, hematuria and urgency.  Musculoskeletal: Negative for back pain, joint pain and myalgias.  Skin: Negative for rash.  Neurological: Negative for dizziness, tingling, focal weakness, seizures, weakness and headaches.  Endo/Heme/Allergies: Does not bruise/bleed easily.  Psychiatric/Behavioral: Negative for depression and suicidal ideas. The patient does not have insomnia.       Allergies  Allergen Reactions  . Penicillin G Hives  . Sulfa Antibiotics Rash     Past Medical History:  Diagnosis Date  . Diabetes mellitus without complication ([place])   . Hypertension   . Stroke [facility]) 2013     Past Surgical History:  Procedure Laterality Date  . CYSTOSCOPY W/ RETROGRADES N/A 11/20/2019   Procedure: CYSTOSCOPY WITH RETROGRADE PYELOGRAM;  Surgeon: Billey Co, MD;  Location: [facility] ORS;  Service: Urology;  Laterality: N/A;  . CYSTOSCOPY WITH STENT PLACEMENT Left 11/20/2019   Procedure: CYSTOSCOPY WITH  STENT PLACEMENT;  Surgeon: Billey Co, MD;  Location: [facility] ORS;  Service: Urology;  Laterality: Left;  . TRANSURETHRAL RESECTION OF BLADDER TUMOR N/A 11/20/2019   Procedure: TRANSURETHRAL RESECTION OF BLADDER TUMOR (TURBT);  Surgeon: Billey Co, MD;  Location: [facility] ORS;  Service: Urology;  Laterality: N/A;    Social History   Socioeconomic History  . Marital status: Widowed    Spouse name: Not on file  . Number of children: Not on file  . Years of education: Not on file  . Highest education level: Not on file  Occupational History  . Not on file  Tobacco Use  . Smoking status: Never Smoker  . Smokeless tobacco: Never Used  Substance and Sexual Activity  . Alcohol use: No  . Drug use: No  . Sexual activity: Not on file  Other Topics Concern  . Not on file  Social History Narrative  . Not on file   Social Determinants of Health   Financial Resource Strain:   . Difficulty of Paying Living Expenses:   Food Insecurity:   . Worried About Charity fundraiser in the Last Year:   . Arboriculturist in the Last Year:   Transportation Needs:   . Film/video editor (Medical):   Marland Kitchen Lack of Transportation (Non-Medical):   Physical Activity:   . Days of Exercise per Week:   . Minutes of Exercise per Session:   Stress:   . Feeling of Stress :   Social Connections:   . Frequency of Communication with Friends and Family:   . Frequency of Social Gatherings with Friends and Family:   . Attends Religious Services:   . Active Member of Clubs or Organizations:   .  Attends Archivist Meetings:   Marland Kitchen Marital Status:   Intimate Partner Violence:   . Fear of Current or Ex-Partner:   . Emotionally Abused:   Marland Kitchen Physically Abused:   . Sexually Abused:     History reviewed. No pertinent family history.   Current Facility-Administered Medications:  .  0.9 %  sodium chloride infusion, , Intravenous, PRN, Billey Co, MD, Stopped at 11/20/19 2050 .  acetaminophen  (TYLENOL) tablet 650 mg, 650 mg, Oral, Q6H PRN **OR** acetaminophen (TYLENOL) suppository 650 mg, 650 mg, Rectal, Q6H PRN, Billey Co, MD .  atenolol (TENORMIN) tablet 50 mg, 50 mg, Oral, Daily, Billey Co, MD, 50 mg at 11/23/19 0851 .  cephALEXin (KEFLEX) capsule 500 mg, 500 mg, Oral, Q8H, Fritzi Mandes, MD, 500 mg at 11/23/19 0548 .  Chlorhexidine Gluconate Cloth 2 % PADS 6 each, 6 each, Topical, Daily, Fritzi Mandes, MD, 6 each at 11/22/19 2230 .  diltiazem (CARDIZEM SR) 12 hr capsule 60 mg, 60 mg, Oral, Q12H, Billey Co, MD, 60 mg at 11/23/19 0851 .  furosemide (LASIX) tablet 40 mg, 40 mg, Oral, BID, Billey Co, MD, 40 mg at 11/23/19 0850 .  insulin aspart (novoLOG) injection 0-15 Units, 0-15 Units, Subcutaneous, TID WC, Billey Co, MD, 3 Units at 11/22/19 1734 .  insulin aspart (novoLOG) injection 0-5 Units, 0-5 Units, Subcutaneous, QHS, Billey Co, MD, 2 Units at 11/21/19 2110 .  ondansetron (ZOFRAN) tablet 4 mg, 4 mg, Oral, Q6H PRN **OR** ondansetron (ZOFRAN) injection 4 mg, 4 mg, Intravenous, Q6H PRN, Nickolas Madrid C, MD .  polyethylene glycol (MIRALAX / GLYCOLAX) packet 17 g, 17 g, Oral, Daily PRN, Nickolas Madrid C, MD .  potassium chloride (KLOR-CON) CR tablet 10 mEq, 10 mEq, Oral, BID, Billey Co, MD, 10 mEq at 11/23/19 0855 .  sodium chloride flush (NS) 0.9 % injection 3 mL, 3 mL, Intravenous, Q12H, Billey Co, MD, 3 mL at 11/23/19 0851  Physical exam:  Vitals:   11/22/19 1229 11/22/19 1242 11/22/19 2012 11/23/19 0455  BP: 120/77 133/82 123/69 119/69  Pulse: 84 92 75 81  Resp: _0 Temp:  97.9 F (36.6 C) 97.7 F (36.5 C) 97.8 F (36.6 C)  TempSrc:  Oral Oral Oral  SpO2: 96% 97% 97% 97%  Weight:      Height:       Physical Exam Constitutional:      General: She is not in acute distress.    Appearance: She is obese.  Cardiovascular:     Rate and Rhythm: Normal rate and regular rhythm.     Heart sounds: Normal heart  sounds.  Pulmonary:     Effort: Pulmonary effort is normal.     Breath sounds: Normal breath sounds.  Abdominal:     General: Bowel sounds are normal.     Palpations: Abdomen is soft.     Comments: obese  Skin:    General: Skin is warm and dry.  Neurological:     Mental Status: She is alert and oriented to person, place, and time.      CMP Latest Ref Rng & Units 11/23/2019  Glucose 70 - 99 mg/dL 121(H)  BUN 8 - 23 mg/dL 29(H)  Creatinine 0.44 - 1.00 mg/dL 1.00  Sodium 135 - 145 mmol/L 139  Potassium 3.5 - 5.1 mmol/L 3.7  Chloride 98 - 111 mmol/L 99  CO2 22 - 32 mmol/L 33(H)  Calcium 8.9 - 10.3  mg/dL 7.9(L)  Total Protein 6.5 - 8.1 g/dL -  Total Bilirubin 0.3 - 1.2 mg/dL -  Alkaline Phos 38 - 126 U/L -  AST 15 - 41 U/L -  ALT 0 - 44 U/L -   CBC Latest Ref Rng & Units 11/23/2019  WBC 4.0 - 10.5 K/uL 9.0  Hemoglobin 12.0 - 15.0 g/dL 11.1(L)  Hematocrit 36 - 46 % 33.6(L)  Platelets 150 - 400 K/uL 299    _0 @  DG Tibia/Fibula Right  Result Date: 11/18/2019 CLINICAL DATA:  Status post fall. Right lower leg pain. Initial encounter. EXAM: RIGHT TIBIA AND FIBULA - 2 VIEW COMPARISON:  None. FINDINGS: No acute bony or joint abnormality. Severe knee and talonavicular osteoarthritis noted. Soft tissues are unremarkable. IMPRESSION: No acute abnormality. Electronically Signed   By: Inge Rise M.D.   On: 11/18/2019 10:16   CT CHEST WO CONTRAST  Result Date: 11/21/2019 CLINICAL DATA:  Inpatient. New diagnosis of bladder cancer. Endometrial mass. Liver mass. Chest staging. EXAM: CT CHEST WITHOUT CONTRAST TECHNIQUE: Multidetector CT imaging of the chest was performed following the standard protocol without IV contrast. COMPARISON:  11/29/2019 MRI abdomen/pelvis. 11/19/2019 CT abdomen/pelvis. 11/18/2019 chest radiograph. FINDINGS: Motion degraded scan, particularly at the lung bases, limiting assessment. Cardiovascular: Mild cardiomegaly. No significant pericardial  effusion/thickening. Three-vessel coronary atherosclerosis. Atherosclerotic nonaneurysmal thoracic aorta. Top-normal caliber main pulmonary artery (3 point cm diameter). Mediastinum/Nodes: No discrete thyroid nodules. Unremarkable esophagus. No axillary adenopathy. Lobulated anterior mediastinal 4.1 x 3.6 cm soft tissue mass (series 2/image 46). Otherwise no pathologically enlarged mediastinal or discrete hilar nodes on these noncontrast images. Lungs/Pleura: No pneumothorax. No pleural effusion. No acute consolidative airspace disease, lung masses or significant pulmonary nodules. 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 Exophytic isodense 1.3 cm renal cortical lesion in the lateral upper right kidney (series 2/image 145), not well characterized on MRI from earlier today due to motion degradation. Musculoskeletal: No aggressive appearing focal osseous lesions. Moderate thoracic spondylosis. IMPRESSION: 1. Limited motion degraded scan. 2. 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 No additional potential findings of metastatic disease in the chest. 3. Mild cardiomegaly. Three-vessel coronary atherosclerosis. 4. Left adrenal myelolipoma. 5. Exophytic isodense 1.3 cm renal cortical lesion in the lateral upper right kidney, not well characterized on MRI from earlier today due to motion degradation. Suggest attention on follow-up CT or MRI abdomen without and with IV contrast in 6-12 months. 6. Aortic Atherosclerosis (ICD10-I70.0). Electronically Signed   By: Ilona Sorrel M.D.   On: 11/21/2019 16:24   MR PELVIS W WO CONTRAST  Result Date: 11/21/2019 CLINICAL DATA:  Bladder and endometrial masses., concern for hepatic metastatic disease EXAM: MRI ABDOMEN AND PELVIS WITHOUT AND WITH CONTRAST TECHNIQUE: Multiplanar multisequence MR imaging of the abdomen and pelvis was performed both before and after the administration of  intravenous contrast. CONTRAST:  34m GADAVIST GADOBUTROL 1 MMOL/ML IV SOLN COMPARISON:  CT evaluation of November 19, 2019 FINDINGS: COMBINED FINDINGS FOR BOTH MR ABDOMEN AND PELVIS Lower chest: No effusion or consolidation. Cardiomegaly. Heart is incompletely imaged. Hepatobiliary: Mass in hepatic subsegment IV B along the gallbladder fossa showing target like appearance measuring approximately 3.9 x 3.1 cm. (Image 48, series 28) mass abuts the gallbladder. Trace pericholecystic fluid. No significant wall enhancement or pericholecystic stranding. No biliary duct dilation. Small satellite lesions seen just inferior to the dominant mass measuring approximately 1 cm (image 57, series 28) Pancreas: Multi cystic changes throughout the  pancreas (image 25, series 1812 mm cystic lesion in the head of the pancreas. Between 10 and 15 additional small cystic foci about the pancreas with pancreatic atrophy and mild main duct distension. An example of another lesion is a 13 mm lesion in the uncinate (image 28, series 18) areas without overtly suspicious features otherwise. Spleen:  Spleen normal in size and contour. 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 No sign of hydronephrosis. Small RIGHT renal cyst. Stomach/Bowel: Gastrointestinal tract is normal to the extent evaluated. Study not performed for bowel evaluation. Vascular/Lymphatic: Patent abdominal vasculature with atherosclerotic plaque in the nonaneurysmal abdominal aorta. Retroaortic LEFT renal vein. Celiac nodal enlargement and portacaval nodal enlargement (image 48, series 27) 12 mm portacaval lymph node adjacent to pancreatic head. (Image 90, series 21) 16 mm celiac lymph node.  IVC is patent. No pelvic adenopathy. Pelvic vasculature not well assessed but grossly patent. Reproductive: Thickening and irregularity of the endometrium measuring approximately 4 cm greatest thickness with cystic changes. Myometrial invasion best seen on sagittal  image 23, series 35, there is motion on the current exam which limits assessment but suggestion of invasion at least to 50% of the myometrium, oblique post-contrast images raise this question as well but obliquity challenges the ability to determine depth of myometrial invasion Urinary bladder is decompressed, large tumor removed by trans urethral resection. Assessment of urinary bladder is limited by under distension. Other:  No ascites. Musculoskeletal: Vertebral hemangiomata and endplate degenerative changes. IMPRESSION: 1. Hepatic mass suspicious for metastatic disease. Given other findings outlined below biopsy may be helpful to determine source of metastasis. 2. Endometrial mass with invasive features. Study limited by motion on the current evaluation but imaging suggests myometrial invasion to at least 50% along the anterior uterus. Given above findings depending on results of additional workup; if depth of myometrial invasion will change patient management a repeat evaluation may be helpful in the outpatient setting. 3. Celiac and portacaval nodal enlargement, suspicious for nodal metastases. 4. Assessment of the urinary bladder is limited by under distension large tumor has been resected. Electronically Signed   By: Zetta Bills M.D.   On: 11/21/2019 08:38   MR ABDOMEN W WO CONTRAST  Result Date: 11/21/2019 CLINICAL DATA:  Bladder and endometrial masses., concern for hepatic metastatic disease EXAM: MRI ABDOMEN AND PELVIS WITHOUT AND WITH CONTRAST TECHNIQUE: Multiplanar multisequence MR imaging of the abdomen and pelvis was performed both before and after the administration of intravenous contrast. CONTRAST:  63m GADAVIST GADOBUTROL 1 MMOL/ML IV SOLN COMPARISON:  CT evaluation of November 19, 2019 FINDINGS: COMBINED FINDINGS FOR BOTH MR ABDOMEN AND PELVIS Lower chest: No effusion or consolidation. Cardiomegaly. Heart is incompletely imaged. Hepatobiliary: Mass in hepatic subsegment IV B along the  gallbladder fossa showing target like appearance measuring approximately 3.9 x 3.1 cm. (Image 48, series 28) mass abuts the gallbladder. Trace pericholecystic fluid. No significant wall enhancement or pericholecystic stranding. No biliary duct dilation. Small satellite lesions seen just inferior to the dominant mass measuring approximately 1 cm (image 57, series 28) Pancreas: Multi cystic changes throughout the pancreas (image 25, series 1812 mm cystic lesion in the head of the pancreas. Between 10 and 15 additional small cystic foci about the pancreas with pancreatic atrophy and mild main duct distension. An example of another lesion is a 13 mm lesion in the uncinate (image 28, series 18) areas without overtly suspicious features otherwise. Spleen:  Spleen normal in size and contour. 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  No sign of hydronephrosis. Small RIGHT renal cyst. Stomach/Bowel: Gastrointestinal tract is normal to the extent evaluated. Study not performed for bowel evaluation. Vascular/Lymphatic: Patent abdominal vasculature with atherosclerotic plaque in the nonaneurysmal abdominal aorta. Retroaortic LEFT renal vein. Celiac nodal enlargement and portacaval nodal enlargement (image 48, series 27) 12 mm portacaval lymph node adjacent to pancreatic head. (Image 90, series 21) 16 mm celiac lymph node.  IVC is patent. No pelvic adenopathy. Pelvic vasculature not well assessed but grossly patent. Reproductive: Thickening and irregularity of the endometrium measuring approximately 4 cm greatest thickness with cystic changes. Myometrial invasion best seen on sagittal image 23, series 35, there is motion on the current exam which limits assessment but suggestion of invasion at least to 50% of the myometrium, oblique post-contrast images raise this question as well but obliquity challenges the ability to determine depth of myometrial invasion Urinary bladder is decompressed, large tumor  removed by trans urethral resection. Assessment of urinary bladder is limited by under distension. Other:  No ascites. Musculoskeletal: Vertebral hemangiomata and endplate degenerative changes. IMPRESSION: 1. Hepatic mass suspicious for metastatic disease. Given other findings outlined below biopsy may be helpful to determine source of metastasis. 2. Endometrial mass with invasive features. Study limited by motion on the current evaluation but imaging suggests myometrial invasion to at least 50% along the anterior uterus. Given above findings depending on results of additional workup; if depth of myometrial invasion will change patient management a repeat evaluation may be helpful in the outpatient setting. 3. Celiac and portacaval nodal enlargement, suspicious for nodal metastases. 4. Assessment of the urinary bladder is limited by under distension large tumor has been resected. Electronically Signed   By: Zetta Bills M.D.   On: 11/21/2019 08:38   US Pelvis Complete  Result Date: 11/18/2019 CLINICAL DATA:  Postmenopausal patient with vaginal bleeding. EXAM: TRANSABDOMINAL ULTRASOUND OF PELVIS TECHNIQUE: Transabdominal ultrasound examination of the pelvis was performed including evaluation of the uterus, ovaries, adnexal regions, and pelvic cul-de-sac. COMPARISON:  None. FINDINGS: Uterus Measurements: 11.5 x 6.2 x 7.2 cm = volume: 270 mL. No fibroids or other mass visualized. Endometrium Thickness: 3.5 cm.  No focal abnormality visualized. Right ovary Measurements: Not visualized. Left ovary Measurements: Not visualized. Other findings: Imaging of the urinary bladder demonstrates an echogenic lesion within the bladder measuring 4.3 x 2.8 x 6.5 cm. There is flow within the lesion on Doppler imaging. IMPRESSION: Endometrial thickness is considered abnormal for a post-menopausal female. Endometrial sampling should be considered to exclude carcinoma. Lesion within the urinary bladder with flow on Doppler imaging  worrisome for neoplasm. Recommend consult with urology. Electronically Signed   By: Inge Rise M.D.   On: 11/18/2019 14:55   DG Pelvis Portable  Result Date: 11/18/2019 CLINICAL DATA:  Status post fall.  Pain.  Initial encounter. EXAM: PORTABLE PELVIS 1-2 VIEWS COMPARISON:  None. FINDINGS: There is no evidence of pelvic fracture or diastasis. No pelvic bone lesions are seen. IMPRESSION: Negative exam. Electronically Signed   By: Inge Rise M.D.   On: 11/18/2019 10:16   US BIOPSY (LIVER)  Result Date: 11/22/2019 INDICATION: 69 year old female with a history of liver mass, possible gynecologic malignancy [place] EXAM: ULTRASOUND-GUIDED LIVER MASS BIOPSY MEDICATIONS: None. ANESTHESIA/SEDATION: Moderate (conscious) sedation was employed during this procedure. A total of Versed 1.0 mg and Fentanyl 50 mcg was administered intravenously. Moderate Sedation Time: 10 minutes. The patient's level of consciousness and vital signs were monitored continuously by radiology nursing throughout the procedure under my direct supervision. FLUOROSCOPY  TIME:  Ultrasound COMPLICATIONS: None PROCEDURE: Informed written consent was obtained from the patient after a thorough discussion of the procedural risks, benefits and alternatives. All questions were addressed. Maximal Sterile Barrier Technique was utilized including caps, mask, sterile gowns, sterile gloves, sterile drape, hand hygiene and skin antiseptic. A timeout was performed prior to the initiation of the procedure. Ultrasound survey of the liver performed with images stored and sent to PACs. The epigastric region was prepped with chlorhexidine in a sterile fashion, and a sterile drape was applied covering the operative field. A sterile gown and sterile gloves were used for the procedure. Local anesthesia was provided with 1% Lidocaine. The patient was prepped and draped sterilely and the skin and subcutaneous tissues were generously infiltrated with 1% lidocaine.  A 17 gauge introducer needle was then advanced under ultrasound guidance in sub costal location into the liver, targeting the hypoechoic mass. The stylet was removed, and multiple separate 18 gauge core biopsy were retrieved. Samples were placed into formalin for transportation to the lab. Gel-Foam pledgets were then infused with a small amount of saline for assistance with hemostasis. The needle was removed, and a final ultrasound image was performed. The patient tolerated the procedure well and remained hemodynamically stable throughout. No complications were encountered and no significant blood loss was encounter. IMPRESSION: Status post ultrasound-guided biopsy of liver mass Signed, Dulcy Fanny. Dellia Nims, RPVI [facility] Electronically Signed   By: Corrie Mckusick D.O.   On: 11/22/2019 12:25   DG Chest Portable 1 View  Result Date: 11/18/2019 CLINICAL DATA:  Status post fall today. EXAM: PORTABLE CHEST 1 VIEW COMPARISON:  None. FINDINGS: The lungs are clear. Heart size is upper normal. No pneumothorax or pleural effusion. No acute or focal bony abnormality. IMPRESSION: No acute disease. Electronically Signed   By: Inge Rise M.D.   On: 11/18/2019 10:12   DG Knee Complete 4 Views Left  Result Date: 11/18/2019 CLINICAL DATA:  Status post fall. Right knee pain. Initial encounter. EXAM: LEFT KNEE - COMPLETE 4+ VIEW COMPARISON:  None. FINDINGS: There is no acute bony or joint abnormality. Severe tricompartmental osteoarthritis noted. No joint effusion. IMPRESSION: No acute finding. Severe tricompartmental osteoarthritis. Electronically Signed   By: Inge Rise M.D.   On: 11/18/2019 10:15   DG OR UROLOGY CYSTO IMAGE ([facility] ONLY)  Result Date: 11/20/2019 There is no interpretation for this exam.  This order is for images obtained during a surgical procedure.  Please See "Surgeries" Tab for more information regarding the procedure.    ECHOCARDIOGRAM COMPLETE  Result Date: 11/19/2019    ECHOCARDIOGRAM REPORT   Patient Name:   [facility] Date of Exam: 11/19/2019 Medical Rec #:  [id number]        Height:       65.0 in Accession #:    [id number]       Weight:       292.8 lb Date of Birth:  [birth year]        BSA:          2.326 m Patient Age:    34 years         BP:           110/72 mmHg Patient Gender: F                HR:           89 bpm. Exam Location:  [facility] Procedure: 2D Echo, Cardiac Doppler and Color  Doppler Indications:     Abnormal ECG 794.31  History:         Patient has prior history of Echocardiogram examinations, most                  recent 03/23/2016. Stroke; Risk Factors:Hypertension and                  Diabetes.  Sonographer:     Sherrie Sport RDCS (AE) Referring Phys:  [id number] [facility] AMIN Diagnosing Phys: Harrell Gave End MD  Sonographer Comments: Technically challenging study due to limited acoustic windows, no apical window and no subcostal window. IMPRESSIONS  1. Left ventricular ejection fraction, by estimation, is >55%. The left ventricle has normal function. Left ventricular endocardial border not optimally defined to evaluate regional wall motion. There is severe left ventricular hypertrophy. Left ventricular diastolic function could not be evaluated.  2. Right ventricular systolic function is normal. The right ventricular size is not well visualized. Tricuspid regurgitation signal is inadequate for assessing PA pressure.  3. The mitral valve is grossly normal. Trivial mitral valve regurgitation.  4. The aortic valve is grossly normal. Aortic valve regurgitation not well assessed. FINDINGS  Left Ventricle: Left ventricular ejection fraction, by estimation, is >55%. The left ventricle has normal function. Left ventricular endocardial border not optimally defined to evaluate regional wall motion. The left ventricular internal cavity size was  normal in size. There is severe left ventricular hypertrophy. Left ventricular  diastolic function could not be evaluated. Right Ventricle: The right ventricular size is not well visualized. No increase in right ventricular wall thickness. Right ventricular systolic function is normal. Tricuspid regurgitation signal is inadequate for assessing PA pressure. Left Atrium: Left atrial size was not well visualized. Right Atrium: Right atrial size was not well visualized. Pericardium: The pericardium was not well visualized. Mitral Valve: The mitral valve is grossly normal. Mild mitral annular calcification. Trivial mitral valve regurgitation. Tricuspid Valve: The tricuspid valve is grossly normal. Tricuspid valve regurgitation is trivial. Aortic Valve: The aortic valve is grossly normal. Aortic valve regurgitation not well assessed. Pulmonic Valve: The pulmonic valve was not well visualized. Pulmonic valve regurgitation is not visualized. No evidence of pulmonic stenosis. Aorta: The aortic root is normal in size and structure. Pulmonary Artery: The pulmonary artery is of normal size. IAS/Shunts: The interatrial septum was not well visualized.  LEFT VENTRICLE PLAX 2D LVIDd:         4.60 cm LVIDs:         2.97 cm LV PW:         1.89 cm LV IVS:        2.25 cm LVOT diam:     2.00 cm LVOT Area:     3.14 cm  LEFT ATRIUM         Index LA diam:    5.70 cm 2.45 cm/m                        PULMONIC VALVE AORTA                 PV Vmax:        0.72 m/s Ao Root diam: 3.50 cm PV Peak grad:   2.1 mmHg                       RVOT Peak grad: 2 mmHg   SHUNTS Systemic Diam: 2.00 cm Nelva Bush MD Electronically signed by Nelva Bush MD Signature  Date/Time: 11/19/2019/6:33:33 PM    Final    CT HEMATURIA WORKUP  Addendum Date: 11/20/2019   ADDENDUM REPORT: 11/20/2019 12:05 ADDENDUM: The submitted images are without intravenous contrast. In addition to the above findings, there is an oval, heterogeneous, faintly visualized mass in the inferior aspect of the medial segment of the left lobe of the liver,  adjacent to the gallbladder fossa, measuring 4.3 x 3.6 cm in maximum dimensions on image number 31 series 4. This is concerning for a liver metastasis. These results will be called to the ordering clinician or representative by the Radiologist Assistant, and communication documented in the PACS or [employer]. Electronically Signed   By: Claudie Revering M.D.   On: 11/20/2019 12:05   Result Date: 11/20/2019 CLINICAL DATA:  Hematuria.  Weakness. EXAM: CT ABDOMEN AND PELVIS WITHOUT AND WITH CONTRAST TECHNIQUE: Multidetector CT imaging of the abdomen and pelvis was performed following the standard protocol before and following the bolus administration of intravenous contrast. CONTRAST:  126m OMNIPAQUE IOHEXOL 300 MG/ML  SOLN COMPARISON:  Pelvic ultrasound obtained yesterday. FINDINGS: Lower chest: Enlarged heart. Minimal pericardial effusion with a maximum thickness of 11 mm. Minimal bibasilar atelectasis or scarring. Hepatobiliary: 2 tiny gallstones in the gallbladder and ill-defined soft tissue density in the dependent portion of the gallbladder. No gallbladder wall thickening or pericholecystic fluid. Unremarkable liver. Pancreas: Unremarkable. No pancreatic ductal dilatation or surrounding inflammatory changes. Spleen: Normal in size without focal abnormality. Adrenals/Urinary Tract: Rounded fat density left adrenal mass measuring 2.0 cm on image number 23 series 2. Normal appearing right adrenal gland. Mild-to-moderate dilatation of the left renal collecting system and ureter to the level of the ureterovesical junction. At that location, there is an irregular, rounded mass in the bladder measuring 3.7 x 3.7 cm on image number 78 series 2 with a small anterior calcification. This mass measures 4.0 cm in length on sagittal image number 75. To the right of midline in the dependent portion of the bladder, there is a 2nd mass measures 3.1 x 1.8 x 1.7 cm on sagittal image number 67 and axial image number 78 series  2. 3 mm right renal calculus and 1.4 cm rounded, medium density mass arising from the lateral aspect of the midportion of the right kidney, measured on image number 28 series 2. Normal appearing right ureter. Stomach/Bowel: Stomach is within normal limits. Appendix appears normal. No evidence of bowel wall thickening, distention, or inflammatory changes. Vascular/Lymphatic: Atheromatous arterial calcifications without aneurysm. No enlarged lymph nodes. Reproductive: Heterogeneous endometrial mass measuring 4.5 x 4.0 cm on image number 60 series 2 and 6.3 cm in length on sagittal image number 80. No adnexal mass seen. Other: Small amount of subcutaneous air anteriorly on the right, compatible with recent subcutaneous injection. There is also bilateral subcutaneous edema at the level of the lower abdomen, greater on the left. Musculoskeletal: Lumbar and lower thoracic spine degenerative changes. IMPRESSION: 1. 4.0 x 3.7 x 3.7 cm irregular, rounded mass in the bladder on the left, compatible with a primary bladder carcinoma. This is obstructing the distal left ureter causing mild to moderate left hydronephrosis and hydroureter. 2. 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 3. 3 mm nonobstructing right renal calculus and 1.4 cm right renal mass or proteinaceous cyst. 4. 6.3 x 4.5 x 4.0 cm heterogeneous endometrial mass. 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 5. Calcified cholelithiasis and possible noncalcified cholelithiasis or  sludge in the gallbladder 6. Minimal pericardial effusion. 7. Cardiomegaly. 8. 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 Electronically Signed: By: Claudie Revering M.D. On: 11/19/2019 14:21     Assessment and plan- Patient is a 69 y.o. female with morbid obesity admitted for generalized weakness and hematuria and found to have bladder mass, endometrial mass and liver met  1.  Patient is s/p TURBT.  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  Patient also has a hepatic mass which was biopsied 2 days ago and results are currently pending.  Patient also has an endometrial mass and it remains to be seen in the liver mass is metastatic endometrial cancer or bladder cancer.  Patient was also seen by Dr. Georgianne Fick and a bedside endometrial biopsy was attempted but could not be completed.  At this time plan is to wait for liver biopsy results to come back.  If it does not show endometrial cancer then she would need outpatient endometrial biopsy.  Also discussed the results of CT chest without contrast which showed a anterior mediastinal 4.1 x 3.6 cm soft tissue mass indeterminate for nodal metastases versus primary thymic neoplasm.  Patient is morbidly obese and would not be a surgical candidate for this.  I am inclined to watch this mediastinal mass at this time since we are dealing with 2 possible other malignancies  At this time plan is for patient to go to [facility] today.  Depending on liver biopsy patient may need an outpatient endometrial biopsy.  I will be on vacation and therefore patient will be seen by my colleague Dr. Rogue Bussing on 12/04/2019 at the cancer center to discuss biopsy results and further management.  It remains to be seen if she would be a candidate for systemic treatment based on her performance status after her rehab stay.  If liver metastases is consistent with bladder carcinoma single agent immunotherapy is certainly a consideration     Visit Diagnosis 1. Bladder mass   2. Atrial fibrillation with rapid ventricular response ([place])   3. Vaginal bleeding   4. Fall, initial encounter   5. Acute cystitis without hematuria   6. Hematuria, gross   7. Liver metastases ([place])   8. 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 Randa Evens, MD, MPH [facility] [phone] 11/23/2019 1:32 PM

## 2019-11-23 NOTE — TOC Progression Note (Addendum)
Transition of Care [facility]) - Progression Note    Patient Details  Name: Brittney Shepard MRN: [id number] Date of Birth: 1951/04/26  Transition of Care [facility]) CM/SW [place], LCSW Phone Number: 11/23/2019, 10:35 AM  Clinical Narrative: Patient has insurance approval to discharge to [facility] today. Patient is aware. Left voicemail for her son.    10:48 am: Received call back from son. Provided update. He said that last night patient had mentioned wanting to set up her living will. CSW sent message to chaplain to see if she could be seen before she leaves today. If not, will send her to SNF with advanced directives booklet. SNF admissions coordinator said their social worker could assist as well. Auth number obtained: [id number].  11:47 am: Brittney Shepard is going to discuss Advanced Directives with patient. Will setup transport when he is done.  Expected Discharge Plan: [place] Barriers to Discharge: Continued Medical Work up  Expected Discharge Plan and Services Expected Discharge Plan: [place] In-house Referral: Clinical Social Work Discharge Planning Services: CM Consult Post Acute Care Choice: [place] arrangements for the past 2 months: Single Family Home Expected Discharge Date: 11/23/19                                     Social Determinants of Health (SDOH) Interventions    Readmission Risk Interventions No flowsheet data found.

## 2019-11-23 NOTE — TOC Progression Note (Signed)
Transition of Care [facility]) - Progression Note    Patient Details  Name: Brittney Shepard MRN: [id number] Date of Birth: 1950-10-06  Transition of Care [facility]) CM/SW [place], LCSW Phone Number: 11/23/2019, 8:50 AM  Clinical Narrative:   Insurance authorization is still pending. COVID test completed yesterday and was negative.  Expected Discharge Plan: [place] Barriers to Discharge: Continued Medical Work up  Expected Discharge Plan and Services Expected Discharge Plan: [place] In-house Referral: Clinical Social Work Discharge Planning Services: CM Consult Post Acute Care Choice: [place] arrangements for the past 2 months: Single Family Home                                       Social Determinants of Health (SDOH) Interventions    Readmission Risk Interventions No flowsheet data found.

## 2019-11-24 ENCOUNTER — Ambulatory Visit: Payer: Self-pay | Admitting: Obstetrics and Gynecology

## 2019-11-27 ENCOUNTER — Encounter: Payer: Self-pay | Admitting: Obstetrics and Gynecology

## 2019-11-27 ENCOUNTER — Other Ambulatory Visit: Payer: Self-pay

## 2019-11-27 ENCOUNTER — Ambulatory Visit (INDEPENDENT_AMBULATORY_CARE_PROVIDER_SITE_OTHER): Payer: Medicare Other | Admitting: Obstetrics and Gynecology

## 2019-11-27 ENCOUNTER — Ambulatory Visit: Payer: Self-pay | Admitting: Obstetrics and Gynecology

## 2019-11-27 DIAGNOSIS — N895 Stricture and atresia of vagina: Secondary | ICD-10-CM | POA: Diagnosis not present

## 2019-11-27 DIAGNOSIS — N950 Postmenopausal bleeding: Secondary | ICD-10-CM | POA: Diagnosis not present

## 2019-11-27 DIAGNOSIS — R935 Abnormal findings on diagnostic imaging of other abdominal regions, including retroperitoneum: Secondary | ICD-10-CM | POA: Diagnosis not present

## 2019-11-27 LAB — CYTOLOGY - PAP
Comment: NEGATIVE
Diagnosis: NEGATIVE
High risk HPV: NEGATIVE

## 2019-11-27 NOTE — Progress Notes (Signed)
Obstetrics & Gynecology Office Visit   Chief Complaint:  Chief Complaint  Patient presents with  . EMB    History of Present Illness: 69 year old female who on recent ER visit initially prompted by fall was noted to have a bladder mass on imaging to evaluate for fractures.  Transurethral resection on 11/20/2019 revealed a high grade papillary urothelial carcinoma.  CT scan and ultrasound revealed thickened endometrium.  Further imaging via MRI revealed significantly thickened and cystic endometrium with myometrial invasion favored, as well as a liver lesion which was biopsied on 11/22/2019 with pathology pending.  Pap smear was obtained 12/31/2019 (this was a pap manually guided as patient did not tolerate bedside speculum exam and cervix was unable to be visualized).  She presents today for repeat attempt at endometrial biopsy in order to obtain tissue pathology to guide further treatment.  The patient remains in SN[facility] post discharge.     Review of Systems:Review of Systems  Constitutional: Negative.   Gastrointestinal: Positive for abdominal pain. Negative for constipation, diarrhea, nausea and vomiting.  Genitourinary: Negative.   Musculoskeletal: Positive for back pain and myalgias.     Past Medical History:  Past Medical History:  Diagnosis Date  . Diabetes mellitus without complication ([place])   . Hypertension   . Stroke [facility]) 2013    Past Surgical History:  Past Surgical History:  Procedure Laterality Date  . CYSTOSCOPY W/ RETROGRADES N/A 11/20/2019   Procedure: CYSTOSCOPY WITH RETROGRADE PYELOGRAM;  Surgeon: Billey Co, MD;  Location: [facility] ORS;  Service: Urology;  Laterality: N/A;  . CYSTOSCOPY WITH STENT PLACEMENT Left 11/20/2019   Procedure: CYSTOSCOPY WITH STENT PLACEMENT;  Surgeon: Billey Co, MD;  Location: [facility] ORS;  Service: Urology;  Laterality: Left;  . TRANSURETHRAL RESECTION OF BLADDER TUMOR N/A 11/20/2019   Procedure: TRANSURETHRAL RESECTION OF  BLADDER TUMOR (TURBT);  Surgeon: Billey Co, MD;  Location: [facility] ORS;  Service: Urology;  Laterality: N/A;    Gynecologic History: No LMP recorded. Patient is postmenopausal.  Obstetric History: No obstetric history on file.  Family History:  History reviewed. No pertinent family history.  Social History:  Social History   Socioeconomic History  . Marital status: Widowed    Spouse name: Not on file  . Number of children: Not on file  . Years of education: Not on file  . Highest education level: Not on file  Occupational History  . Not on file  Tobacco Use  . Smoking status: Never Smoker  . Smokeless tobacco: Never Used  Substance and Sexual Activity  . Alcohol use: No  . Drug use: No  . Sexual activity: Not on file  Other Topics Concern  . Not on file  Social History Narrative  . Not on file   Social Determinants of Health   Financial Resource Strain:   . Difficulty of Paying Living Expenses:   Food Insecurity:   . Worried About Charity fundraiser in the Last Year:   . Arboriculturist in the Last Year:   Transportation Needs:   . Film/video editor (Medical):   Marland Kitchen Lack of Transportation (Non-Medical):   Physical Activity:   . Days of Exercise per Week:   . Minutes of Exercise per Session:   Stress:   . Feeling of Stress :   Social Connections:   . Frequency of Communication with Friends and Family:   . Frequency of Social Gatherings with Friends and Family:   .  Attends Religious Services:   . Active Member of Clubs or Organizations:   . Attends Archivist Meetings:   Marland Kitchen Marital Status:   Intimate Partner Violence:   . Fear of Current or Ex-Partner:   . Emotionally Abused:   Marland Kitchen Physically Abused:   . Sexually Abused:     Allergies:  Allergies  Allergen Reactions  . Penicillin G Hives  . Sulfa Antibiotics Rash    Medications: Prior to Admission medications   Medication Sig Start Date End Date Taking? Authorizing Provider    acetaminophen (TYLENOL) 325 MG tablet Take 2 tablets (650 mg total) by mouth every 6 (six) hours as needed for mild pain (or Fever >/= 101). 03/27/16  Yes Mody, Ulice Bold, MD  atenolol (TENORMIN) 50 MG tablet Take 1 tablet (50 mg total) by mouth daily. 03/28/16  Yes Mody, Ulice Bold, MD  diltiazem (CARDIZEM SR) 60 MG 12 hr capsule Take 1 capsule (60 mg total) by mouth every 12 (twelve) hours. 03/27/16  Yes Mody, Ulice Bold, MD  furosemide (LASIX) 40 MG tablet Take 1 tablet (40 mg total) by mouth 2 (two) times daily. 03/27/16  Yes Mody, Sital, MD  ipratropium-albuterol (DUONEB) 0.5-2.5 (3) MG/3ML SOLN Take 3 mLs by nebulization 3 (three) times daily. 03/27/16  Yes Mody, Ulice Bold, MD  metFORMIN (GLUCOPHAGE) 500 MG tablet Take 500 mg by mouth daily with breakfast.   Yes [provider]  polyethylene glycol (MIRALAX / GLYCOLAX) 17 g packet Take 17 g by mouth daily as needed for mild constipation. 11/23/19  Yes Wieting, Richard, MD  potassium chloride (KLOR-CON) 10 MEQ tablet Take 10 mEq by mouth 2 (two) times daily.   Yes [provider]  senna-docusate (SENOKOT-S) 8.6-50 MG tablet Take 1 tablet by mouth 2 (two) times daily. 03/27/16  Yes Bettey Costa, MD    Physical Exam  No LMP recorded. Patient is postmenopausal.  General: NAD HEENT: normocephalic, anicteric Pulmonary: No increased work of breathing Genitourinary:  External: Some erythema and irriation of the perineum.  Normal urethral meatus with indwelling foley in place draining clear urine, normal  Bartholin's and Skene's glands.    Vagina: Normal vaginal mucosa, mild bleeding noted, no evidence of prolapse.  The introitus is significantly narrowed  Cervix: Extremely anteverted beneath the pubic bone.  Given the narrowing of the introitus the speculum is unable to be sufficently opened to allow visualization of the cervix.  Attempt to localize the cervix using and endosee device was unsucessful   Uterus:Exam limited by habitus.  No  CMT  Rectal: deferred  Lymphatic: no evidence of inguinal lymphadenopathy Extremities: no edema, erythema, or tenderness Neurologic: Grossly intact Psychiatric: mood appropriate, affect full  Female chaperone present for pelvic  portions of the physical exam  Assessment: 69 y.o. postmenopausal bleeding, abnormal imaged endometrium, and vaginal stenosis  Plan: Problem List Items Addressed This Visit      Other   Postmenopausal bleeding   Relevant Orders   Ambulatory referral to Gynecologic Oncology   Abnormal endometrial ultrasound   Relevant Orders   Ambulatory referral to Gynecologic Oncology    Other Visit Diagnoses    Vaginal stenosis    -  Primary   Relevant Orders   Ambulatory referral to Gynecologic Oncology     1) PMB - Inability of visualize cervix to obtain endometrial biopsy today.  Given the anatomic challenges noted I do not feel operative hysteroscopy D&C will be any more successful in visualizing the cervix and obtaining tissue.  Will refer to gynecology  oncology for further management recommendations.  2) Return if symptoms worsen or fail to improve.   Malachy Mood, MD, [facility], [facility] 11/27/2019, 8:08 PM

## 2019-11-27 NOTE — Telephone Encounter (Signed)
Patient confirmed scheduled appointment

## 2019-11-29 ENCOUNTER — Ambulatory Visit: Payer: Self-pay | Admitting: Urology

## 2019-11-30 ENCOUNTER — Other Ambulatory Visit: Payer: Medicare Other

## 2019-12-01 NOTE — Progress Notes (Signed)
Tumor Board Documentation  Brittney Shepard was presented at our Tumor Board on 11/30/2019, which included representatives from medical oncology, radiation oncology, surgical oncology, internal medicine, navigation, pathology, radiology, surgical, pharmacy, genetics, research, palliative care, pulmonology.  Brittney Shepard currently presents as a new patient, for [place], for new positive pathology with history of the following treatments: active survellience, surgical intervention(s).  Additionally, we reviewed previous medical and familial history, history of present illness, and recent lab results along with all available histopathologic and imaging studies. The tumor board considered available treatment options and made the following recommendations: Additional screening (Beta HCG, AFP) Referral to GYN Oncology for endometrial mass  The following procedures/referrals were also placed: No orders of the defined types were placed in this encounter.   Clinical Trial Status: not discussed   Staging used: To be determined  AJCC Staging:       Group: Papillary Urothelial Bladder Cancer and Poorly Differentiated Carcinoma of Liver awaiting further testing   National site-specific guidelines   were discussed with respect to the case.  Tumor board is a meeting of clinicians from various specialty areas who evaluate and discuss patients for whom a multidisciplinary approach is being considered. Final determinations in the plan of care are those of the provider(s). The responsibility for follow up of recommendations given during tumor board is that of the provider.   Today's extended care, comprehensive team conference, Brittney Shepard was not present for the discussion and was not examined.   Multidisciplinary Tumor Board is a multidisciplinary case peer review process.  Decisions discussed in the Multidisciplinary Tumor Board reflect the opinions of the specialists present at the conference without having examined  the patient.  Ultimately, treatment and diagnostic decisions rest with the primary provider(s) and the patient.

## 2019-12-04 ENCOUNTER — Inpatient Hospital Stay: Payer: Medicare Other

## 2019-12-04 ENCOUNTER — Inpatient Hospital Stay: Payer: Medicare Other | Attending: Internal Medicine | Admitting: Internal Medicine

## 2019-12-04 ENCOUNTER — Ambulatory Visit: Payer: Medicare Other | Admitting: Physician Assistant

## 2019-12-04 ENCOUNTER — Other Ambulatory Visit: Payer: Self-pay

## 2019-12-04 ENCOUNTER — Encounter: Payer: Self-pay | Admitting: Internal Medicine

## 2019-12-04 ENCOUNTER — Telehealth: Payer: Self-pay | Admitting: Internal Medicine

## 2019-12-04 ENCOUNTER — Ambulatory Visit (INDEPENDENT_AMBULATORY_CARE_PROVIDER_SITE_OTHER): Payer: Medicare Other | Admitting: Physician Assistant

## 2019-12-04 VITALS — Ht 65.0 in | Wt 292.0 lb

## 2019-12-04 VITALS — BP 106/66 | HR 53 | Temp 97.0°F | Resp 22 | Ht 65.0 in | Wt 292.0 lb

## 2019-12-04 DIAGNOSIS — Z809 Family history of malignant neoplasm, unspecified: Secondary | ICD-10-CM | POA: Insufficient documentation

## 2019-12-04 DIAGNOSIS — Z8551 Personal history of malignant neoplasm of bladder: Secondary | ICD-10-CM | POA: Diagnosis not present

## 2019-12-04 DIAGNOSIS — I5032 Chronic diastolic (congestive) heart failure: Secondary | ICD-10-CM | POA: Diagnosis not present

## 2019-12-04 DIAGNOSIS — C541 Malignant neoplasm of endometrium: Secondary | ICD-10-CM | POA: Diagnosis not present

## 2019-12-04 DIAGNOSIS — Z7984 Long term (current) use of oral hypoglycemic drugs: Secondary | ICD-10-CM | POA: Insufficient documentation

## 2019-12-04 DIAGNOSIS — C679 Malignant neoplasm of bladder, unspecified: Secondary | ICD-10-CM

## 2019-12-04 DIAGNOSIS — C787 Secondary malignant neoplasm of liver and intrahepatic bile duct: Secondary | ICD-10-CM

## 2019-12-04 DIAGNOSIS — E279 Disorder of adrenal gland, unspecified: Secondary | ICD-10-CM | POA: Insufficient documentation

## 2019-12-04 DIAGNOSIS — I4891 Unspecified atrial fibrillation: Secondary | ICD-10-CM | POA: Insufficient documentation

## 2019-12-04 DIAGNOSIS — Z7982 Long term (current) use of aspirin: Secondary | ICD-10-CM | POA: Diagnosis not present

## 2019-12-04 DIAGNOSIS — C801 Malignant (primary) neoplasm, unspecified: Secondary | ICD-10-CM

## 2019-12-04 DIAGNOSIS — Z79899 Other long term (current) drug therapy: Secondary | ICD-10-CM | POA: Insufficient documentation

## 2019-12-04 DIAGNOSIS — Z8041 Family history of malignant neoplasm of ovary: Secondary | ICD-10-CM | POA: Insufficient documentation

## 2019-12-04 DIAGNOSIS — Z7189 Other specified counseling: Secondary | ICD-10-CM

## 2019-12-04 DIAGNOSIS — R1900 Intra-abdominal and pelvic swelling, mass and lump, unspecified site: Secondary | ICD-10-CM | POA: Diagnosis not present

## 2019-12-04 DIAGNOSIS — E119 Type 2 diabetes mellitus without complications: Secondary | ICD-10-CM | POA: Insufficient documentation

## 2019-12-04 DIAGNOSIS — Z87440 Personal history of urinary (tract) infections: Secondary | ICD-10-CM | POA: Insufficient documentation

## 2019-12-04 DIAGNOSIS — Z8673 Personal history of transient ischemic attack (TIA), and cerebral infarction without residual deficits: Secondary | ICD-10-CM | POA: Diagnosis not present

## 2019-12-04 DIAGNOSIS — I110 Hypertensive heart disease with heart failure: Secondary | ICD-10-CM | POA: Insufficient documentation

## 2019-12-04 LAB — COMPREHENSIVE METABOLIC PANEL
ALT: 16 U/L (ref 0–44)
AST: 24 U/L (ref 15–41)
Albumin: 3.5 g/dL (ref 3.5–5.0)
Alkaline Phosphatase: 87 U/L (ref 38–126)
Anion gap: 12 (ref 5–15)
BUN: 23 mg/dL (ref 8–23)
CO2: 28 mmol/L (ref 22–32)
Calcium: 8 mg/dL — ABNORMAL LOW (ref 8.9–10.3)
Chloride: 94 mmol/L — ABNORMAL LOW (ref 98–111)
Creatinine, Ser: 0.97 mg/dL (ref 0.44–1.00)
GFR calc Af Amer: >60 mL/min (ref >60)
GFR calc non Af Amer: 59 mL/min — ABNORMAL LOW (ref >60)
Glucose, Bld: 175 mg/dL — ABNORMAL HIGH (ref 70–99)
Potassium: 3.6 mmol/L (ref 3.5–5.1)
Sodium: 134 mmol/L — ABNORMAL LOW (ref 135–145)
Total Bilirubin: 1 mg/dL (ref 0.3–1.2)
Total Protein: 7.4 g/dL (ref 6.5–8.1)

## 2019-12-04 LAB — CBC WITH DIFFERENTIAL/PLATELET
Abs Immature Granulocytes: 0.04 10*3/uL (ref 0.00–0.07)
Basophils Absolute: 0.1 10*3/uL (ref 0.0–0.1)
Basophils Relative: 1 %
Eosinophils Absolute: 0.4 10*3/uL (ref 0.0–0.5)
Eosinophils Relative: 4 %
HCT: 39.7 % (ref 36.0–46.0)
Hemoglobin: 12.6 g/dL (ref 12.0–15.0)
Immature Granulocytes: 0 %
Lymphocytes Relative: 13 %
Lymphs Abs: 1.2 10*3/uL (ref 0.7–4.0)
MCH: 27.1 pg (ref 26.0–34.0)
MCHC: 31.7 g/dL (ref 30.0–36.0)
MCV: 85.4 fL (ref 80.0–100.0)
Monocytes Absolute: 0.5 10*3/uL (ref 0.1–1.0)
Monocytes Relative: 6 %
Neutro Abs: 6.9 10*3/uL (ref 1.7–7.7)
Neutrophils Relative %: 76 %
Platelets: 286 10*3/uL (ref 150–400)
RBC: 4.65 MIL/uL (ref 3.87–5.11)
RDW: 14.7 % (ref 11.5–15.5)
WBC: 9.1 10*3/uL (ref 4.0–10.5)
nRBC: 0 % (ref 0.0–0.2)

## 2019-12-04 LAB — LACTATE DEHYDROGENASE: LDH: 152 U/L (ref 98–192)

## 2019-12-04 LAB — SURGICAL PATHOLOGY

## 2019-12-04 NOTE — Progress Notes (Signed)
12/04/2019 10:31 AM   Brittney Shepard 1950/07/26 [id number]  CC: Chief Complaint  Patient presents with   Other    Catheter removal    HPI: Brittney Shepard is a 69 y.o. female with a recent hospitalization after a fall who was subsequently found to have multiple large bladder tumors on CT, suspected uterine malignancy, and a liver mass concerning for metastatic disease with unclear primary.  She underwent TURBT and left ureteral stent placement with Dr. Diamantina Providence while inpatient on 11/20/2019 for management of a large bladder tumor overlying the left UO.  She presents today for postoperative Foley removal.    Today, she reports occasional sensations of urinary urgency associated with urinary drainage into her Foley catheter.  She has no other concerns today.  She denies a history of urinary retention.  PMH: Past Medical History:  Diagnosis Date   Diabetes mellitus without complication ([place])    Hypertension    Stroke [facility]) 2013    Surgical History: Past Surgical History:  Procedure Laterality Date   CYSTOSCOPY W/ RETROGRADES N/A 11/20/2019   Procedure: CYSTOSCOPY WITH RETROGRADE PYELOGRAM;  Surgeon: Billey Co, MD;  Location: [facility] ORS;  Service: Urology;  Laterality: N/A;   CYSTOSCOPY WITH STENT PLACEMENT Left 11/20/2019   Procedure: CYSTOSCOPY WITH STENT PLACEMENT;  Surgeon: Billey Co, MD;  Location: [facility] ORS;  Service: Urology;  Laterality: Left;   TRANSURETHRAL RESECTION OF BLADDER TUMOR N/A 11/20/2019   Procedure: TRANSURETHRAL RESECTION OF BLADDER TUMOR (TURBT);  Surgeon: Billey Co, MD;  Location: [facility] ORS;  Service: Urology;  Laterality: N/A;    Home Medications:  Allergies as of 12/04/2019      Reactions   Penicillin G Hives   Sulfa Antibiotics Rash      Medication List       Accurate as of December 04, 2019 10:31 AM. If you have any questions, ask your nurse or doctor.        acetaminophen 325 MG tablet Commonly known as: TYLENOL Take 2  tablets (650 mg total) by mouth every 6 (six) hours as needed for mild pain (or Fever >/= 101).   atenolol 50 MG tablet Commonly known as: TENORMIN Take 1 tablet (50 mg total) by mouth daily.   diltiazem 60 MG 12 hr capsule Commonly known as: CARDIZEM SR Take 1 capsule (60 mg total) by mouth every 12 (twelve) hours.   furosemide 40 MG tablet Commonly known as: Lasix Take 1 tablet (40 mg total) by mouth 2 (two) times daily.   ipratropium-albuterol 0.5-2.5 (3) MG/3ML Soln Commonly known as: DUONEB Take 3 mLs by nebulization 3 (three) times daily.   metFORMIN 500 MG tablet Commonly known as: GLUCOPHAGE Take 500 mg by mouth daily with breakfast.   polyethylene glycol 17 g packet Commonly known as: MIRALAX / GLYCOLAX Take 17 g by mouth daily as needed for mild constipation.   potassium chloride 10 MEQ tablet Commonly known as: KLOR-CON Take 10 mEq by mouth 2 (two) times daily.   senna-docusate 8.6-50 MG tablet Commonly known as: Senokot-S Take 1 tablet by mouth 2 (two) times daily.       Allergies:  Allergies  Allergen Reactions   Penicillin G Hives   Sulfa Antibiotics Rash    Family History: No family history on file.  Social History:   reports that she has never smoked. She has never used smokeless tobacco. She reports that she does not drink alcohol and does not use drugs.  Physical Exam: Ht  5\' 5"  (1.651 m)    Wt (!) 292 lb (132.5 kg)    BMI 48.59 kg/m   Constitutional:  Alert and oriented, no acute distress, nontoxic appearing HEENT: [place], AT Cardiovascular: No clubbing, cyanosis, or edema Respiratory: Normal respiratory effort, no increased work of breathing Skin: No rashes, bruises or suspicious lesions Neurologic: Grossly intact, no focal deficits, moving all 4 extremities Psychiatric: Normal mood and affect  Assessment & Plan:   1. 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  Counseled patient to follow-up in clinic if she develops difficulty urinating.  Expressed understanding.  Will arrange close follow-up with Dr. Diamantina Providence to discuss pathology results and treatment planning.  Return in about 2 days (around 12/06/2019) for Discuss pathology results with Dr. Diamantina Providence.  Debroah Loop, PA-C  [facility] [address], [place], [place] [address] [phone]

## 2019-12-04 NOTE — Progress Notes (Signed)
[place] NOTE  Patient Care Team: Brittney Merles, MD as PCP - General (Family Medicine)  CHIEF COMPLAINTS/PURPOSE OF CONSULTATION: Cancer  #  Oncology History Overview Note  # 2021- Liver bx-poorly differentiated CK 7+ carcinoma- DIAGNOSIS:  A. LIVER; ULTRASOUND-GUIDED CORE NEEDLE BIOPSY:  - POSITIVE FOR MALIGNANCY.  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   Comment:  Immunohistochemical studies show tumor cells to be positive for CK7 and  CK19, with focal faint expression of CD20. Tumor cells are negative for  CDX-2, GATA-3, TTF-1, and Pax-8. Additional stains were attempted, but  malignant tissue is exhausted in the core utilized for these studies.   The immunohistochemical pattern is relatively non-specific. The  patient's recent diagnosis of invasive urothelial carcinoma is noted,  but the histologic pattern does not appear similar, and lack of staining  for GATA3 would suggest against this. Concern for a gynecologic  malignancy is also noted, but lack of marking for Pax-8 would suggest  against this. CK7 positivity may be seen in carcinomas arising in the  upper GI tract, lung, pancreatobiliary tree, and thymus, among other  sites. Lack of marking for TTF-1 would suggest against lung. The  presence of CK19 staining, while not specific, may be seen in carcinomas  arising from the pancreatobiliary tree, as well as urinary bladder,  endometrium, lung, and kidney. Recent laboratory testing indicates liver  function testing within normal limits. Clinical and radiographic  correlation is recommended.   # JULY 2021- cystocopy- DIAGNOSIS: A. 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  - NO MUSCULARIS PROPRIA IDENTIFIED.   B. 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  - MUSCULARIS PROPRIA PRESENT AND UNINVOLVED.   # ENDOMETRIAL MASS [Dr.Staebler]- unable to Bx- sec to cervical stenosis  # Mediastinal mass  Lobulated anterior mediastinal 4.1 x 3.6 cm soft tissue mass, indeterminate for nodal metastasis versus primary thymic neoplasm. No additional potential findings of metastatic disease in the chest. 3. Mild cardiomegaly. Three-vessel coronary atherosclerosis. 4. Left adrenal myelolipoma. 5. Exophytic isodense 1.3 cm renal cortical lesion in the lateral upper right kidney  # CHF-compensated; morbid obesity; diabetes; COPD [not on O2]; A. Fib- not on anticoagulation.    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[place])  11/22/2019 Initial Diagnosis   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[place])   12/04/2019 -  Chemotherapy   The patient had DEXAMETHASONE 4 MG PO TABS, 8 mg, Oral, Daily, 0 of 1 cycle, Start date: --, End date: -- PALONOSETRON HCL INJECTION 0.25 MG/5ML, 0.25 mg, Intravenous,  Once, 0 of 3 cycles PEGFILGRASTIM-JMDB 6 MG/0.6ML [place] SOSY, 6 mg, Subcutaneous,  Once, 0 of 3 cycles CARBOPLATIN CHEMO IV INFUSION ORDERABLE (BY AUC), , Intravenous,  Once, 0 of 3 cycles FOSAPREPITANT IV INFUSION 150 MG, 150 mg, Intravenous,  Once, 0 of 3 cycles PACLITAXEL CHEMO IV INFUSION ORDERABLE (> 80 MG/M2), 225 mg/m2, Intravenous,  Once, 0 of 3 cycles  for chemotherapy treatment.    Liver metastases ([place])   Initial Diagnosis   Liver metastases ([place])   12/04/2019 -  Chemotherapy   The patient had DEXAMETHASONE 4 MG PO TABS, 8 mg, Oral, Daily, 0 of 1 cycle, Start date: --, End date: -- PALONOSETRON HCL INJECTION 0.25 MG/5ML, 0.25 mg, Intravenous,  Once, 0 of 3 cycles PEGFILGRASTIM-JMDB 6 MG/0.6ML [place] SOSY, 6 mg, Subcutaneous,  Once, 0 of 3 cycles CARBOPLATIN CHEMO IV  INFUSION ORDERABLE (BY AUC), , Intravenous,  Once, 0 of 3 cycles FOSAPREPITANT IV INFUSION 150 MG, 150 mg, Intravenous,  Once, 0 of 3 cycles PACLITAXEL CHEMO IV  INFUSION ORDERABLE (> 80 MG/M2), 225 mg/m2, Intravenous,  Once, 0 of 3 cycles  for chemotherapy treatment.       HISTORY OF PRESENTING ILLNESS:  Brittney Shepard 69 y.o.  female with multiple medical problems including CHF morbid obesity A. Fib-not on anticoagulation; history of stroke COPD not on home O2 is here for follow-up after recent hospital visit.  Patient was evaluated by Dr. Janese Shepard in the hospital; patient is here for follow-up with me in Dr. Elroy Shepard absence  Patient stated she recently fell but seems a mechanical fall.  However further work-up led to mass in the bladder for which she had TURBT.  On further imaging noted to have a uterine mass; and also liver mass; and anterior mediastinal mass on CT chest.  Patient went on to have biopsy of the liver mass.  She was also evaluated by gynecology-unable to biopsy uterine mass given the concerns of cervical stenosis.  Given the overall debility/patient is currently at [facility].  She plans to stay there for 1 more week.   Review of Systems  Constitutional: Positive for malaise/fatigue and weight loss. Negative for chills, diaphoresis and fever.  HENT: Negative for nosebleeds and sore throat.   Eyes: Negative for double vision.  Respiratory: Negative for cough, hemoptysis, sputum production, shortness of breath and wheezing.   Cardiovascular: Positive for leg swelling. Negative for chest pain, palpitations and orthopnea.  Gastrointestinal: Negative for abdominal pain, blood in stool, constipation, diarrhea, heartburn, melena, nausea and vomiting.  Genitourinary: Negative for dysuria, frequency and urgency.  Musculoskeletal: Positive for back pain and joint pain.  Skin: Negative.  Negative for itching and rash.  Neurological: Negative for dizziness, tingling, focal weakness, weakness and headaches.  Endo/Heme/Allergies: Does not bruise/bleed easily.  Psychiatric/Behavioral: Negative for depression. The patient is not  nervous/anxious and does not have insomnia.      MEDICAL HISTORY:  Past Medical History:  Diagnosis Date  . A-fib ([place])   . Bladder cancer ([place])   . CHF (congestive heart failure) ([place])   . Chronic pain   . Cognitive communication deficit   . Diabetes mellitus without complication ([place])   . Hypertension   . Hypokalemia   . Muscle weakness (generalized)   . 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 Secondary malignant neoplasm of liver ([place])   . Stroke [facility]) 2013  . Urinary tract infection, site not specified     SURGICAL HISTORY: Past Surgical History:  Procedure Laterality Date  . CYSTOSCOPY W/ RETROGRADES N/A 11/20/2019   Procedure: CYSTOSCOPY WITH RETROGRADE PYELOGRAM;  Surgeon: Billey Co, MD;  Location: [facility] ORS;  Service: Urology;  Laterality: N/A;  . CYSTOSCOPY WITH STENT PLACEMENT Left 11/20/2019   Procedure: CYSTOSCOPY WITH STENT PLACEMENT;  Surgeon: Billey Co, MD;  Location: [facility] OR[facility];  Service: Urology;  Laterality: Left;  . TRANSURETHRAL RESECTION OF BLADDER TUMOR N/A 11/20/2019   Procedure: TRANSURETHRAL RESECTION OF BLADDER TUMOR (TURBT);  Surgeon: Billey Co, MD;  Location: [facility] ORS;  Service: Urology;  Laterality: N/A;    SOCIAL HISTORY: Social History   Socioeconomic History  . Marital status: Widowed    Spouse name: Not on file  . Number of children: Not on file  . Years of education: Not on file  . Highest education level: Not on file  Occupational History  . Not on file  Tobacco Use  . Smoking status: Never Smoker  . Smokeless tobacco: Never Used  Substance and Sexual Activity  . Alcohol use: No  . Drug use: No  . Sexual activity: Not Currently  Other Topics Concern  . Not on file  Social History Narrative   Retd. 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 Has one son.    Social Determinants of Health   Financial Resource Strain:   . Difficulty of Paying Living Expenses:   Food Insecurity:   .  Worried About Charity fundraiser in the Last Year:   . Arboriculturist in the Last Year:   Transportation Needs:   . Film/video editor (Medical):   Marland Kitchen Lack of Transportation (Non-Medical):   Physical Activity:   . Days of Exercise per Week:   . Minutes of Exercise per Session:   Stress:   . Feeling of Stress :   Social Connections:   . Frequency of Communication with Friends and Family:   . Frequency of Social Gatherings with Friends and Family:   . Attends Religious Services:   . Active Member of Clubs or Organizations:   . Attends Archivist Meetings:   Marland Kitchen Marital Status:   Intimate Partner Violence:   . Fear of Current or Ex-Partner:   . Emotionally Abused:   Marland Kitchen Physically Abused:   . Sexually Abused:     FAMILY HISTORY: Family History  Problem Relation Age of Onset  . Ovarian cancer Mother   . Cancer Sister     ALLERGIES:  is allergic to penicillin g and sulfa antibiotics.  MEDICATIONS:  Current Outpatient Medications  Medication Sig Dispense Refill  . acetaminophen (TYLENOL) 325 MG tablet Take 2 tablets (650 mg total) by mouth every 6 (six) hours as needed for mild pain (or Fever >/= 101). 60 tablet 0  . atenolol (TENORMIN) 50 MG tablet Take 1 tablet (50 mg total) by mouth daily. 30 tablet 0  . barrier cream (NON-SPECIFIED) CREA Apply 1 application topically 2 (two) times daily as needed.    . diltiazem (CARDIZEM SR) 60 MG 12 hr capsule Take 1 capsule (60 mg total) by mouth every 12 (twelve) hours. 60 capsule 0  . furosemide (LASIX) 40 MG tablet Take 1 tablet (40 mg total) by mouth 2 (two) times daily. 30 tablet 0  . ipratropium-albuterol (DUONEB) 0.5-2.5 (3) MG/3ML SOLN Take 3 mLs by nebulization 3 (three) times daily. 360 mL 0  . metFORMIN (GLUCOPHAGE) 500 MG tablet Take 500 mg by mouth daily with breakfast.    . nystatin (NYSTATIN) powder Apply 1 application topically 2 (two) times daily.    . polyethylene glycol (MIRALAX / GLYCOLAX) 17 g packet Take  17 g by mouth daily as needed for mild constipation. 14 each 0  . potassium chloride (KLOR-CON) 10 MEQ tablet Take 10 mEq by mouth 2 (two) times daily.    Marland Kitchen senna-docusate (SENOKOT-S) 8.6-50 MG tablet Take 1 tablet by mouth 2 (two) times daily. 60 tablet 0   No current facility-administered medications for this visit.      Marland Kitchen  PHYSICAL EXAMINATION: ECOG PERFORMANCE STATUS: 2 - Symptomatic, <50% confined to bed  Vitals:   12/04/19 1051  BP: 106/66  Pulse: 53  Resp: 22  Temp: (!) 97 F (36.1 C)   Filed Weights   12/04/19 1048  Weight: (!) 292 lb (132.5 kg)    Physical Exam  Constitutional:      Comments: Morbidly obese Caucasian female patient.  She is in a wheelchair.  She is alone.  HENT:     Head: Normocephalic and atraumatic.     Mouth/Throat:     Pharynx: No oropharyngeal exudate.  Eyes:     Pupils: Pupils are equal, round, and reactive to light.  Cardiovascular:     Rate and Rhythm: Normal rate and regular rhythm.  Pulmonary:     Effort: No respiratory distress.     Breath sounds: No wheezing.     Comments: Decreased air entry bilaterally. Abdominal:     General: Bowel sounds are normal. There is no distension.     Palpations: Abdomen is soft. There is no mass.     Tenderness: There is no abdominal tenderness. There is no guarding or rebound.  Musculoskeletal:        General: No tenderness. Normal range of motion.     Cervical back: Normal range of motion and neck supple.  Skin:    General: Skin is warm.  Neurological:     Mental Status: She is alert and oriented to person, place, and time.  Psychiatric:        Mood and Affect: Affect normal.      LABORATORY DATA:  I have reviewed the data as listed Lab Results  Component Value Date   WBC 9.0 11/23/2019   HGB 11.1 (L) 11/23/2019   HCT 33.6 (L) 11/23/2019   MCV 85.7 11/23/2019   PLT 299 11/23/2019   Recent Labs    11/18/19 0919 11/18/19 0919 11/19/19 0418 11/22/19 0535 11/23/19 0527  NA 137    < > 137 136 139  K 4.2   < > 3.8 4.0 3.7  CL 98   < > 101 96* 99  CO2 26   < > 29 28 33*  GLUCOSE 207*   < > 139* 141* 121*  BUN 20   < > 19 26* 29*  CREATININE 1.27*   < > 1.10* 0.95 1.00  CALCIUM 8.6*   < > 7.9* 8.1* 7.9*  GFRNONAA 43*   < > 51* >60 57*  GFRAA 50*   < > 59* >60 >60  PROT 7.6  --   --   --   --   ALBUMIN 3.5  --   --   --   --   AST 23  --   --   --   --   ALT 12  --   --   --   --   ALKPHOS 78  --   --   --   --   BILITOT 0.7  --   --   --   --    < > = values in this interval not displayed.    RADIOGRAPHIC STUDIES: I have personally reviewed the radiological images as listed and agreed with the findings in the report. DG Tibia/Fibula Right  Result Date: 11/18/2019 CLINICAL DATA:  Status post fall. Right lower leg pain. Initial encounter. EXAM: RIGHT TIBIA AND FIBULA - 2 VIEW COMPARISON:  None. FINDINGS: No acute bony or joint abnormality. Severe knee and talonavicular osteoarthritis noted. Soft tissues are unremarkable. IMPRESSION: No acute abnormality. Electronically Signed   By: Inge Rise M.D.   On: 11/18/2019 10:16   CT CHEST WO CONTRAST  Result Date: 11/21/2019 CLINICAL DATA:  Inpatient. New diagnosis of bladder cancer. Endometrial mass. Liver mass. Chest staging. EXAM: CT CHEST WITHOUT CONTRAST TECHNIQUE: Multidetector CT  imaging of the chest was performed following the standard protocol without IV contrast. COMPARISON:  11/29/2019 MRI abdomen/pelvis. 11/19/2019 CT abdomen/pelvis. 11/18/2019 chest radiograph. FINDINGS: Motion degraded scan, particularly at the lung bases, limiting assessment. Cardiovascular: Mild cardiomegaly. No significant pericardial effusion/thickening. Three-vessel coronary atherosclerosis. Atherosclerotic nonaneurysmal thoracic aorta. Top-normal caliber main pulmonary artery (3 point cm diameter). Mediastinum/Nodes: No discrete thyroid nodules. Unremarkable esophagus. No axillary adenopathy. Lobulated anterior mediastinal 4.1 x 3.6 cm  soft tissue mass (series 2/image 46). Otherwise no pathologically enlarged mediastinal or discrete hilar nodes on these noncontrast images. Lungs/Pleura: No pneumothorax. No pleural effusion. No acute consolidative airspace disease, lung masses or significant pulmonary nodules. 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 Exophytic isodense 1.3 cm renal cortical lesion in the lateral upper right kidney (series 2/image 145), not well characterized on MRI from earlier today due to motion degradation. Musculoskeletal: No aggressive appearing focal osseous lesions. Moderate thoracic spondylosis. IMPRESSION: 1. Limited motion degraded scan. 2. 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 No additional potential findings of metastatic disease in the chest. 3. Mild cardiomegaly. Three-vessel coronary atherosclerosis. 4. Left adrenal myelolipoma. 5. Exophytic isodense 1.3 cm renal cortical lesion in the lateral upper right kidney, not well characterized on MRI from earlier today due to motion degradation. Suggest attention on follow-up CT or MRI abdomen without and with IV contrast in 6-12 months. 6. Aortic Atherosclerosis (ICD10-I70.0). Electronically Signed   By: Delbert Phenix M.D.   On: 11/21/2019 16:24   MR PELVIS W WO CONTRAST  Result Date: 11/21/2019 CLINICAL DATA:  Bladder and endometrial masses., concern for hepatic metastatic disease EXAM: MRI ABDOMEN AND PELVIS WITHOUT AND WITH CONTRAST TECHNIQUE: Multiplanar multisequence MR imaging of the abdomen and pelvis was performed both before and after the administration of intravenous contrast. CONTRAST:  41mL GADAVIST GADOBUTROL 1 MMOL/ML IV SOLN COMPARISON:  CT evaluation of November 19, 2019 FINDINGS: COMBINED FINDINGS FOR BOTH MR ABDOMEN AND PELVIS Lower chest: No effusion or consolidation. Cardiomegaly. Heart is incompletely imaged. Hepatobiliary: Mass in hepatic subsegment IV B  along the gallbladder fossa showing target like appearance measuring approximately 3.9 x 3.1 cm. (Image 48, series 28) mass abuts the gallbladder. Trace pericholecystic fluid. No significant wall enhancement or pericholecystic stranding. No biliary duct dilation. Small satellite lesions seen just inferior to the dominant mass measuring approximately 1 cm (image 57, series 28) Pancreas: Multi cystic changes throughout the pancreas (image 25, series 1812 mm cystic lesion in the head of the pancreas. Between 10 and 15 additional small cystic foci about the pancreas with pancreatic atrophy and mild main duct distension. An example of another lesion is a 13 mm lesion in the uncinate (image 28, series 18) areas without overtly suspicious features otherwise. Spleen:  Spleen normal in size and contour. 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 No sign of hydronephrosis. Small RIGHT renal cyst. Stomach/Bowel: Gastrointestinal tract is normal to the extent evaluated. Study not performed for bowel evaluation. Vascular/Lymphatic: Patent abdominal vasculature with atherosclerotic plaque in the nonaneurysmal abdominal aorta. Retroaortic LEFT renal vein. Celiac nodal enlargement and portacaval nodal enlargement (image 48, series 27) 12 mm portacaval lymph node adjacent to pancreatic head. (Image 90, series 21) 16 mm celiac lymph node.  IVC is patent. No pelvic adenopathy. Pelvic vasculature not well assessed but grossly patent. Reproductive: Thickening and irregularity of the endometrium measuring approximately 4 cm greatest thickness with cystic changes. Myometrial invasion best seen on sagittal image 23, series  20, there is motion on the current exam which limits assessment but suggestion of invasion at least to 50% of the myometrium, oblique post-contrast images raise this question as well but obliquity challenges the ability to determine depth of myometrial invasion Urinary bladder is decompressed,  large tumor removed by trans urethral resection. Assessment of urinary bladder is limited by under distension. Other:  No ascites. Musculoskeletal: Vertebral hemangiomata and endplate degenerative changes. IMPRESSION: 1. Hepatic mass suspicious for metastatic disease. Given other findings outlined below biopsy may be helpful to determine source of metastasis. 2. Endometrial mass with invasive features. Study limited by motion on the current evaluation but imaging suggests myometrial invasion to at least 50% along the anterior uterus. Given above findings depending on results of additional workup; if depth of myometrial invasion will change patient management a repeat evaluation may be helpful in the outpatient setting. 3. Celiac and portacaval nodal enlargement, suspicious for nodal metastases. 4. Assessment of the urinary bladder is limited by under distension large tumor has been resected. Electronically Signed   By: Zetta Bills M.D.   On: 11/21/2019 08:38   MR ABDOMEN W WO CONTRAST  Result Date: 11/21/2019 CLINICAL DATA:  Bladder and endometrial masses., concern for hepatic metastatic disease EXAM: MRI ABDOMEN AND PELVIS WITHOUT AND WITH CONTRAST TECHNIQUE: Multiplanar multisequence MR imaging of the abdomen and pelvis was performed both before and after the administration of intravenous contrast. CONTRAST:  78m GADAVIST GADOBUTROL 1 MMOL/ML IV SOLN COMPARISON:  CT evaluation of November 19, 2019 FINDINGS: COMBINED FINDINGS FOR BOTH MR ABDOMEN AND PELVIS Lower chest: No effusion or consolidation. Cardiomegaly. Heart is incompletely imaged. Hepatobiliary: Mass in hepatic subsegment IV B along the gallbladder fossa showing target like appearance measuring approximately 3.9 x 3.1 cm. (Image 48, series 28) mass abuts the gallbladder. Trace pericholecystic fluid. No significant wall enhancement or pericholecystic stranding. No biliary duct dilation. Small satellite lesions seen just inferior to the dominant mass  measuring approximately 1 cm (image 57, series 28) Pancreas: Multi cystic changes throughout the pancreas (image 25, series 1812 mm cystic lesion in the head of the pancreas. Between 10 and 15 additional small cystic foci about the pancreas with pancreatic atrophy and mild main duct distension. An example of another lesion is a 13 mm lesion in the uncinate (image 28, series 18) areas without overtly suspicious features otherwise. Spleen:  Spleen normal in size and contour. 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 No sign of hydronephrosis. Small RIGHT renal cyst. Stomach/Bowel: Gastrointestinal tract is normal to the extent evaluated. Study not performed for bowel evaluation. Vascular/Lymphatic: Patent abdominal vasculature with atherosclerotic plaque in the nonaneurysmal abdominal aorta. Retroaortic LEFT renal vein. Celiac nodal enlargement and portacaval nodal enlargement (image 48, series 27) 12 mm portacaval lymph node adjacent to pancreatic head. (Image 90, series 21) 16 mm celiac lymph node.  IVC is patent. No pelvic adenopathy. Pelvic vasculature not well assessed but grossly patent. Reproductive: Thickening and irregularity of the endometrium measuring approximately 4 cm greatest thickness with cystic changes. Myometrial invasion best seen on sagittal image 23, series 35, there is motion on the current exam which limits assessment but suggestion of invasion at least to 50% of the myometrium, oblique post-contrast images raise this question as well but obliquity challenges the ability to determine depth of myometrial invasion Urinary bladder is decompressed, large tumor removed by trans urethral resection. Assessment of urinary bladder is limited by under distension. Other:  No ascites. Musculoskeletal: Vertebral hemangiomata and endplate degenerative changes.  IMPRESSION: 1. Hepatic mass suspicious for metastatic disease. Given other findings outlined below biopsy may be helpful to  determine source of metastasis. 2. Endometrial mass with invasive features. Study limited by motion on the current evaluation but imaging suggests myometrial invasion to at least 50% along the anterior uterus. Given above findings depending on results of additional workup; if depth of myometrial invasion will change patient management a repeat evaluation may be helpful in the outpatient setting. 3. Celiac and portacaval nodal enlargement, suspicious for nodal metastases. 4. Assessment of the urinary bladder is limited by under distension large tumor has been resected. Electronically Signed   By: Zetta Bills M.D.   On: 11/21/2019 08:38   US Pelvis Complete  Result Date: 11/18/2019 CLINICAL DATA:  Postmenopausal patient with vaginal bleeding. EXAM: TRANSABDOMINAL ULTRASOUND OF PELVIS TECHNIQUE: Transabdominal ultrasound examination of the pelvis was performed including evaluation of the uterus, ovaries, adnexal regions, and pelvic cul-de-sac. COMPARISON:  None. FINDINGS: Uterus Measurements: 11.5 x 6.2 x 7.2 cm = volume: 270 mL. No fibroids or other mass visualized. Endometrium Thickness: 3.5 cm.  No focal abnormality visualized. Right ovary Measurements: Not visualized. Left ovary Measurements: Not visualized. Other findings: Imaging of the urinary bladder demonstrates an echogenic lesion within the bladder measuring 4.3 x 2.8 x 6.5 cm. There is flow within the lesion on Doppler imaging. IMPRESSION: Endometrial thickness is considered abnormal for a post-menopausal female. Endometrial sampling should be considered to exclude carcinoma. Lesion within the urinary bladder with flow on Doppler imaging worrisome for neoplasm. Recommend consult with urology. Electronically Signed   By: Inge Rise M.D.   On: 11/18/2019 14:55   DG Pelvis Portable  Result Date: 11/18/2019 CLINICAL DATA:  Status post fall.  Pain.  Initial encounter. EXAM: PORTABLE PELVIS 1-2 VIEWS COMPARISON:  None. FINDINGS: There is no  evidence of pelvic fracture or diastasis. No pelvic bone lesions are seen. IMPRESSION: Negative exam. Electronically Signed   By: Inge Rise M.D.   On: 11/18/2019 10:16   US BIOPSY (LIVER)  Result Date: 11/22/2019 INDICATION: 69 year old female with a history of liver mass, possible gynecologic malignancy [place] EXAM: ULTRASOUND-GUIDED LIVER MASS BIOPSY MEDICATIONS: None. ANESTHESIA/SEDATION: Moderate (conscious) sedation was employed during this procedure. A total of Versed 1.0 mg and Fentanyl 50 mcg was administered intravenously. Moderate Sedation Time: 10 minutes. The patient's level of consciousness and vital signs were monitored continuously by radiology nursing throughout the procedure under my direct supervision. FLUOROSCOPY TIME:  Ultrasound COMPLICATIONS: None PROCEDURE: Informed written consent was obtained from the patient after a thorough discussion of the procedural risks, benefits and alternatives. All questions were addressed. Maximal Sterile Barrier Technique was utilized including caps, mask, sterile gowns, sterile gloves, sterile drape, hand hygiene and skin antiseptic. A timeout was performed prior to the initiation of the procedure. Ultrasound survey of the liver performed with images stored and sent to PACs. The epigastric region was prepped with chlorhexidine in a sterile fashion, and a sterile drape was applied covering the operative field. A sterile gown and sterile gloves were used for the procedure. Local anesthesia was provided with 1% Lidocaine. The patient was prepped and draped sterilely and the skin and subcutaneous tissues were generously infiltrated with 1% lidocaine. A 17 gauge introducer needle was then advanced under ultrasound guidance in sub costal location into the liver, targeting the hypoechoic mass. The stylet was removed, and multiple separate 18 gauge core biopsy were retrieved. Samples were placed into formalin for transportation to the lab. Gel-Foam pledgets  were  then infused with a small amount of saline for assistance with hemostasis. The needle was removed, and a final ultrasound image was performed. The patient tolerated the procedure well and remained hemodynamically stable throughout. No complications were encountered and no significant blood loss was encounter. IMPRESSION: Status post ultrasound-guided biopsy of liver mass Signed, Dulcy Fanny. Dellia Nims, RPVI [facility] Radiology Electronically Signed   By: Corrie Mckusick D.O.   On: 11/22/2019 12:25   DG Chest Portable 1 View  Result Date: 11/18/2019 CLINICAL DATA:  Status post fall today. EXAM: PORTABLE CHEST 1 VIEW COMPARISON:  None. FINDINGS: The lungs are clear. Heart size is upper normal. No pneumothorax or pleural effusion. No acute or focal bony abnormality. IMPRESSION: No acute disease. Electronically Signed   By: Inge Rise M.D.   On: 11/18/2019 10:12   DG Knee Complete 4 Views Left  Result Date: 11/18/2019 CLINICAL DATA:  Status post fall. Right knee pain. Initial encounter. EXAM: LEFT KNEE - COMPLETE 4+ VIEW COMPARISON:  None. FINDINGS: There is no acute bony or joint abnormality. Severe tricompartmental osteoarthritis noted. No joint effusion. IMPRESSION: No acute finding. Severe tricompartmental osteoarthritis. Electronically Signed   By: Inge Rise M.D.   On: 11/18/2019 10:15   DG OR UROLOGY CYSTO IMAGE ([facility] ONLY)  Result Date: 11/20/2019 There is no interpretation for this exam.  This order is for images obtained during a surgical procedure.  Please See "Surgeries" Tab for more information regarding the procedure.   ECHOCARDIOGRAM COMPLETE  Result Date: 11/19/2019    ECHOCARDIOGRAM REPORT   Patient Name:   [facility] Date of Exam: 11/19/2019 Medical Rec #:  [id number]        Height:       65.0 in Accession #:    [id number]       Weight:       292.8 lb Date of Birth:  1951-03-09        BSA:          2.326 m Patient Age:     27 years         BP:           110/72 mmHg Patient Gender: F                HR:           89 bpm. Exam Location:  [facility] Procedure: 2D Echo, Cardiac Doppler and Color Doppler Indications:     Abnormal ECG 794.31  History:         Patient has prior history of Echocardiogram examinations, most                  recent 03/23/2016. Stroke; Risk Factors:Hypertension and                  Diabetes.  Sonographer:     Sherrie Sport RDCS (AE) Referring Phys:  [id number] [facility] AMIN Diagnosing Phys: Harrell Gave End MD  Sonographer Comments: Technically challenging study due to limited acoustic windows, no apical window and no subcostal window. IMPRESSIONS  1. Left ventricular ejection fraction, by estimation, is >55%. The left ventricle has normal function. Left ventricular endocardial border not optimally defined to evaluate regional wall motion. There is severe left ventricular hypertrophy. Left ventricular diastolic function could not be evaluated.  2. Right ventricular systolic function is normal. The right ventricular size is not well visualized. Tricuspid regurgitation signal is inadequate for assessing PA pressure.  3. The mitral valve  is grossly normal. Trivial mitral valve regurgitation.  4. The aortic valve is grossly normal. Aortic valve regurgitation not well assessed. FINDINGS  Left Ventricle: Left ventricular ejection fraction, by estimation, is >55%. The left ventricle has normal function. Left ventricular endocardial border not optimally defined to evaluate regional wall motion. The left ventricular internal cavity size was  normal in size. There is severe left ventricular hypertrophy. Left ventricular diastolic function could not be evaluated. Right Ventricle: The right ventricular size is not well visualized. No increase in right ventricular wall thickness. Right ventricular systolic function is normal. Tricuspid regurgitation signal is inadequate for assessing PA pressure. Left Atrium: Left atrial size was not well  visualized. Right Atrium: Right atrial size was not well visualized. Pericardium: The pericardium was not well visualized. Mitral Valve: The mitral valve is grossly normal. Mild mitral annular calcification. Trivial mitral valve regurgitation. Tricuspid Valve: The tricuspid valve is grossly normal. Tricuspid valve regurgitation is trivial. Aortic Valve: The aortic valve is grossly normal. Aortic valve regurgitation not well assessed. Pulmonic Valve: The pulmonic valve was not well visualized. Pulmonic valve regurgitation is not visualized. No evidence of pulmonic stenosis. Aorta: The aortic root is normal in size and structure. Pulmonary Artery: The pulmonary artery is of normal size. IAS/Shunts: The interatrial septum was not well visualized.  LEFT VENTRICLE PLAX 2D LVIDd:         4.60 cm LVIDs:         2.97 cm LV PW:         1.89 cm LV IVS:        2.25 cm LVOT diam:     2.00 cm LVOT Area:     3.14 cm  LEFT ATRIUM         Index LA diam:    5.70 cm 2.45 cm/m                        PULMONIC VALVE AORTA                 PV Vmax:        0.72 m/s Ao Root diam: 3.50 cm PV Peak grad:   2.1 mmHg                       RVOT Peak grad: 2 mmHg   SHUNTS Systemic Diam: 2.00 cm Nelva Bush MD Electronically signed by Nelva Bush MD Signature Date/Time: 11/19/2019/6:33:33 PM    Final    CT HEMATURIA WORKUP  Addendum Date: 11/20/2019   ADDENDUM REPORT: 11/20/2019 12:05 ADDENDUM: The submitted images are without intravenous contrast. In addition to the above findings, there is an oval, heterogeneous, faintly visualized mass in the inferior aspect of the medial segment of the left lobe of the liver, adjacent to the gallbladder fossa, measuring 4.3 x 3.6 cm in maximum dimensions on image number 31 series 4. This is concerning for a liver metastasis. These results will be called to the ordering clinician or representative by the Radiologist Assistant, and communication documented in the PACS or [employer].  Electronically Signed   By: Claudie Revering M.D.   On: 11/20/2019 12:05   Result Date: 11/20/2019 CLINICAL DATA:  Hematuria.  Weakness. EXAM: CT ABDOMEN AND PELVIS WITHOUT AND WITH CONTRAST TECHNIQUE: Multidetector CT imaging of the abdomen and pelvis was performed following the standard protocol before and following the bolus administration of intravenous contrast. CONTRAST:  153m OMNIPAQUE IOHEXOL 300 MG/ML  SOLN COMPARISON:  Pelvic ultrasound obtained yesterday. FINDINGS: Lower chest: Enlarged heart. Minimal pericardial effusion with a maximum thickness of 11 mm. Minimal bibasilar atelectasis or scarring. Hepatobiliary: 2 tiny gallstones in the gallbladder and ill-defined soft tissue density in the dependent portion of the gallbladder. No gallbladder wall thickening or pericholecystic fluid. Unremarkable liver. Pancreas: Unremarkable. No pancreatic ductal dilatation or surrounding inflammatory changes. Spleen: Normal in size without focal abnormality. Adrenals/Urinary Tract: Rounded fat density left adrenal mass measuring 2.0 cm on image number 23 series 2. Normal appearing right adrenal gland. Mild-to-moderate dilatation of the left renal collecting system and ureter to the level of the ureterovesical junction. At that location, there is an irregular, rounded mass in the bladder measuring 3.7 x 3.7 cm on image number 78 series 2 with a small anterior calcification. This mass measures 4.0 cm in length on sagittal image number 75. To the right of midline in the dependent portion of the bladder, there is a 2nd mass measures 3.1 x 1.8 x 1.7 cm on sagittal image number 67 and axial image number 78 series 2. 3 mm right renal calculus and 1.4 cm rounded, medium density mass arising from the lateral aspect of the midportion of the right kidney, measured on image number 28 series 2. Normal appearing right ureter. Stomach/Bowel: Stomach is within normal limits. Appendix appears normal. No evidence of bowel wall  thickening, distention, or inflammatory changes. Vascular/Lymphatic: Atheromatous arterial calcifications without aneurysm. No enlarged lymph nodes. Reproductive: Heterogeneous endometrial mass measuring 4.5 x 4.0 cm on image number 60 series 2 and 6.3 cm in length on sagittal image number 80. No adnexal mass seen. Other: Small amount of subcutaneous air anteriorly on the right, compatible with recent subcutaneous injection. There is also bilateral subcutaneous edema at the level of the lower abdomen, greater on the left. Musculoskeletal: Lumbar and lower thoracic spine degenerative changes. IMPRESSION: 1. 4.0 x 3.7 x 3.7 cm irregular, rounded mass in the bladder on the left, compatible with a primary bladder carcinoma. This is obstructing the distal left ureter causing mild to moderate left hydronephrosis and hydroureter. 2. 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 3. 3 mm nonobstructing right renal calculus and 1.4 cm right renal mass or proteinaceous cyst. 4. 6.3 x 4.5 x 4.0 cm heterogeneous endometrial mass. 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 5. Calcified cholelithiasis and possible noncalcified cholelithiasis or sludge in the gallbladder 6. Minimal pericardial effusion. 7. Cardiomegaly. 8. 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 Electronically Signed: By: Claudie Revering M.D. 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  Thymic versus others.  Recommend PET scan for further evaluation check-tumor markers-AFP LDH beta-hCG.  Recommend foundation 1  #Long discussion the patient regarding-metastatic malignancy/stage IV incurable disease.  Would recommend palliative therapy-to help decrease the size of the tumor; helping survival.  #Given unclear primary [endometrial/?   Bladder vs others]-carbotaxol chemotherapy; every 3 weeks x 6 cycles; recommend growth factor support. Discussed the potential side effects including but not limited to-increasing fatigue, nausea vomiting, diarrhea, hair loss, sores in the mouth, increase risk of infection and also neuropathy.  After lengthy treatment patient is interested in treatment; however is reluctant with chemotherapy given the side effects.  Wants to look out for other options.  Discussed option of immunotherapy-awaiting foundation 1; will check MSI.   #  Endometrial mass-invasion of the muscle/based on imaging highly concerning for malignancy; unable to biopsy given cervical stenosis.  will discuss with gynecology oncology  # Urothelial-s/p resection high-grade urothelial malignancy; no evidence of any muscle involvement.   #Anterior mediastinal mass-~4 cm in size primary versus metastatic-await PET scan; tumor markers.  # Back pain-question malignancy versus others-await PET scan  #Genetics counseling-given multiple malignancy in the family including ovarian cancer with mother-I think patient is a candidate for genetic testing.  Will refer to genetics down the line.  # I reviewed the blood work- with the patient in detail; also reviewed the imaging independently [as summarized above]; and with the patient in detail.   # DISPOSITION: foundation One-  # labs- cbc/cmpl/LDH; AFP; beta- HCG # PET scan ASAP # chemo education carbo-Taxol /chemocare-next week # follow up MD; 1-2 days after PET scan-Dr.B   All questions were answered. The patient knows to call the clinic with any problems, questions or concerns.    Cammie Sickle, MD 12/04/2019 12:10 PM

## 2019-12-04 NOTE — Assessment & Plan Note (Addendum)
#  Carcinoma of unknown primary -metastatic carcinoma involving the liver-primary is unclear--bladder versus endometrial versus others-?  Thymic versus others.  Recommend PET scan for further evaluation check-tumor markers-AFP LDH beta-hCG.  Recommend foundation 1  #Long discussion the patient regarding-metastatic malignancy/stage IV incurable disease.  Would recommend palliative therapy-to help decrease the size of the tumor; helping survival.  #Given unclear primary [endometrial/?  Bladder vs others]-carbotaxol chemotherapy; every 3 weeks x 6 cycles; recommend growth factor support. Discussed the potential side effects including but not limited to-increasing fatigue, nausea vomiting, diarrhea, hair loss, sores in the mouth, increase risk of infection and also neuropathy.  After lengthy treatment patient is interested in treatment; however is reluctant with chemotherapy given the side effects.  Wants to look out for other options.  Discussed option of immunotherapy-awaiting foundation 1; will check MSI.   # Endometrial mass-invasion of the muscle/based on imaging highly concerning for malignancy; unable to biopsy given cervical stenosis.  will discuss with gynecology oncology  # Urothelial-s/p resection high-grade urothelial malignancy; no evidence of any muscle involvement.   #Anterior mediastinal mass-~4 cm in size primary versus metastatic-await PET scan; tumor markers.  # Back pain-question malignancy versus others-await PET scan  #Genetics counseling-given multiple malignancy in the family including ovarian cancer with mother-I think patient is a candidate for genetic testing.  Will refer to genetics down the line.  # I reviewed the blood work- with the patient in detail; also reviewed the imaging independently [as summarized above]; and with the patient in detail.   # DISPOSITION: foundation One-  # labs- cbc/cmpl/LDH; AFP; beta- HCG # PET scan ASAP # chemo education carbo-Taxol  /chemocare-next week # follow up MD; 1-2 days after PET scan-Dr.B

## 2019-12-04 NOTE — Progress Notes (Signed)
START OFF PATHWAY REGIMEN - Other   [id number]:Carboplatin + Paclitaxel (6/225) q21d:   A cycle is every 21 days:     Paclitaxel      Carboplatin   **Always confirm dose/schedule in your pharmacy ordering system**  Patient Characteristics: Intent of Therapy: Non-Curative / Palliative Intent, Discussed with Patient

## 2019-12-04 NOTE — Telephone Encounter (Signed)
On 8/02-spoke with patient's son Nirel Babler [phone] his mother's complicated medical diagnosis- "carcinoma-unclear primary" awaiting further work-up with a PET scan.  Foundation 1.  Discussed that unfortunately malignancy is incurable/stage IV disease.  Currently awaiting GYN oncology evaluation.

## 2019-12-04 NOTE — Progress Notes (Signed)
Catheter Removal  Patient is present today for a catheter removal.  78ml of water was drained from the balloon. A 22FR foley cath was removed from the bladder no complications were noted . Patient tolerated well.  Performed by: Debroah Loop, PA-C

## 2019-12-05 LAB — AFP TUMOR MARKER: AFP, Serum, Tumor Marker: 1.1 ng/mL (ref 0.0–8.3)

## 2019-12-05 LAB — BETA HCG QUANT (REF LAB): hCG Quant: <1 m[IU]/mL

## 2019-12-05 NOTE — Progress Notes (Signed)
See documentation note. No show for this appt.

## 2019-12-06 ENCOUNTER — Other Ambulatory Visit: Payer: Self-pay

## 2019-12-06 ENCOUNTER — Encounter: Payer: Self-pay | Admitting: Nurse Practitioner

## 2019-12-06 ENCOUNTER — Ambulatory Visit (INDEPENDENT_AMBULATORY_CARE_PROVIDER_SITE_OTHER): Payer: Medicare Other | Admitting: Urology

## 2019-12-06 ENCOUNTER — Encounter: Payer: Self-pay | Admitting: Urology

## 2019-12-06 ENCOUNTER — Inpatient Hospital Stay (HOSPITAL_BASED_OUTPATIENT_CLINIC_OR_DEPARTMENT_OTHER): Payer: Medicare Other | Admitting: Obstetrics and Gynecology

## 2019-12-06 ENCOUNTER — Telehealth: Payer: Self-pay | Admitting: Obstetrics and Gynecology

## 2019-12-06 VITALS — BP 93/57 | HR 56

## 2019-12-06 DIAGNOSIS — C672 Malignant neoplasm of lateral wall of bladder: Secondary | ICD-10-CM

## 2019-12-06 DIAGNOSIS — N1339 Other hydronephrosis: Secondary | ICD-10-CM

## 2019-12-06 DIAGNOSIS — C801 Malignant (primary) neoplasm, unspecified: Secondary | ICD-10-CM

## 2019-12-06 NOTE — Telephone Encounter (Signed)
Pt called about missing her GYN appt today. She would like someone to call her to reschedule. I looked at the schedule for next week, but it's full and Philip Aspen in out of office. Please advise on reschedule and contact patient.

## 2019-12-06 NOTE — Progress Notes (Signed)
   12/06/2019 9:00 AM   Brittney Shepard [birth year] [id number]  Reason for visit: Discuss bladder pathology  HPI: I saw Brittney Shepard back in urology clinic to discuss pathology from recent TURBT and left ureteral stent placement.  She is a comorbid 69 year old female with multiple medical problems including CHF, morbid obesity, atrial fibrillation, history of stroke, and COPD who was recently hospitalized after a fall.  She was found to have a large 6 cm bladder tumor, as well as a thymic mass, and liver mass that ultimately was biopsied and showed poorly differentiated carcinoma of unclear primary, but not felt to be from the bladder.  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  A left ureteral stent was able to be placed for preoperative hydronephrosis.  Renal function improved after left ureteral stent placement from creatinine of 1.27 on admission to 0.97 at discharge.  She denies any pain or urinary symptoms at this time.  Her Foley was removed in clinic 2 days ago, and she has been voiding spontaneously.  She was discussed at tumor board, and oncology is planning paclitaxel/carboplatin with palliative intent.  PET scan is planned for 12/12/2019.  I personally reviewed the CT and MRI imaging with thymic mass and liver mass, and liver biopsy showing poorly differentiated carcinoma of unknown primary.  We reviewed her complex new diagnoses at length today.  She has HG T1 bladder cancer, with no evidence of muscle invasion on her initial TURBT, and left ureteral stent was placed for left-sided hydronephrosis, with improvement in renal function.  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  She also has endometrial thickening worrisome for an additional gynecological malignancy.  We discussed  options at length including repeat TURBT, BCG, or observation.  In the setting of her number of comorbidities and metastatic disease with relatively poor prognosis, I recommended observation at this time with a repeat clinic cystoscopy in 3 months.  We will plan to maintain her left ureteral stent for the time being to optimize her renal function in the setting of pending chemotherapy.  The left ureteral stent can stay in for 9 to 12 months before being changed.  She is in agreement with this plan moving forward.  I discussed her case with Dr. Rogue Bussing via epic chat regarding her complex oncologic care.  RTC 3 months for clinic cystoscopy   [place], [place] [address], [place], [place] [address] [phone]

## 2019-12-07 NOTE — Patient Instructions (Signed)
Paclitaxel injection What is this medicine? PACLITAXEL (PAK li TAX el) is a chemotherapy drug. It targets fast dividing cells, like cancer cells, and causes these cells to die. This medicine is used to treat ovarian cancer, breast cancer, lung cancer, Kaposi's sarcoma, and other cancers. This medicine may be used for other purposes; ask your health care provider or pharmacist if you have questions. COMMON BRAND NAME(S): Onxol, Taxol What should I tell my health care provider before I take this medicine? They need to know if you have any of these conditions:  history of irregular heartbeat  liver disease  low blood counts, like low white cell, platelet, or red cell counts  lung or breathing disease, like asthma  tingling of the fingers or toes, or other nerve disorder  an unusual or allergic reaction to paclitaxel, alcohol, polyoxyethylated castor oil, other chemotherapy, other medicines, foods, dyes, or preservatives  pregnant or trying to get pregnant  breast-feeding How should I use this medicine? This drug is given as an infusion into a vein. It is administered in a hospital or clinic by a specially trained health care professional. Talk to your pediatrician regarding the use of this medicine in children. Special care may be needed. Overdosage: If you think you have taken too much of this medicine contact a poison control center or emergency room at once. NOTE: This medicine is only for you. Do not share this medicine with others. What if I miss a dose? It is important not to miss your dose. Call your doctor or health care professional if you are unable to keep an appointment. What may interact with this medicine? Do not take this medicine with any of the following medications:  disulfiram  metronidazole This medicine may also interact with the following medications:  antiviral medicines for hepatitis, HIV or AIDS  certain antibiotics like erythromycin and  clarithromycin  certain medicines for fungal infections like ketoconazole and itraconazole  certain medicines for seizures like carbamazepine, phenobarbital, phenytoin  gemfibrozil  nefazodone  rifampin  St. John's wort This list may not describe all possible interactions. Give your health care provider a list of all the medicines, herbs, non-prescription drugs, or dietary supplements you use. Also tell them if you smoke, drink alcohol, or use illegal drugs. Some items may interact with your medicine. What should I watch for while using this medicine? Your condition will be monitored carefully while you are receiving this medicine. You will need important blood work done while you are taking this medicine. This medicine can cause serious allergic reactions. To reduce your risk you will need to take other medicine(s) before treatment with this medicine. If you experience allergic reactions like skin rash, itching or hives, swelling of the face, lips, or tongue, tell your doctor or health care professional right away. In some cases, you may be given additional medicines to help with side effects. Follow all directions for their use. This drug may make you feel generally unwell. This is not uncommon, as chemotherapy can affect healthy cells as well as cancer cells. Report any side effects. Continue your course of treatment even though you feel ill unless your doctor tells you to stop. Call your doctor or health care professional for advice if you get a fever, chills or sore throat, or other symptoms of a cold or flu. Do not treat yourself. This drug decreases your body's ability to fight infections. Try to avoid being around people who are sick. This medicine may increase your risk to bruise   or bleed. Call your doctor or health care professional if you notice any unusual bleeding. Be careful brushing and flossing your teeth or using a toothpick because you may get an infection or bleed more easily.  If you have any dental work done, tell your dentist you are receiving this medicine. Avoid taking products that contain aspirin, acetaminophen, ibuprofen, naproxen, or ketoprofen unless instructed by your doctor. These medicines may hide a fever. Do not become pregnant while taking this medicine. Women should inform their doctor if they wish to become pregnant or think they might be pregnant. There is a potential for serious side effects to an unborn child. Talk to your health care professional or pharmacist for more information. Do not breast-feed an infant while taking this medicine. Men are advised not to father a child while receiving this medicine. This product may contain alcohol. Ask your pharmacist or healthcare provider if this medicine contains alcohol. Be sure to tell all healthcare providers you are taking this medicine. Certain medicines, like metronidazole and disulfiram, can cause an unpleasant reaction when taken with alcohol. The reaction includes flushing, headache, nausea, vomiting, sweating, and increased thirst. The reaction can last from 30 minutes to several hours. What side effects may I notice from receiving this medicine? Side effects that you should report to your doctor or health care professional as soon as possible:  allergic reactions like skin rash, itching or hives, swelling of the face, lips, or tongue  breathing problems  changes in vision  fast, irregular heartbeat  high or low blood pressure  mouth sores  pain, tingling, numbness in the hands or feet  signs of decreased platelets or bleeding - bruising, pinpoint red spots on the skin, black, tarry stools, blood in the urine  signs of decreased red blood cells - unusually weak or tired, feeling faint or lightheaded, falls  signs of infection - fever or chills, cough, sore throat, pain or difficulty passing urine  signs and symptoms of liver injury like dark yellow or brown urine; general ill feeling or  flu-like symptoms; light-colored stools; loss of appetite; nausea; right upper belly pain; unusually weak or tired; yellowing of the eyes or skin  swelling of the ankles, feet, hands  unusually slow heartbeat Side effects that usually do not require medical attention (report to your doctor or health care professional if they continue or are bothersome):  diarrhea  hair loss  loss of appetite  muscle or joint pain  nausea, vomiting  pain, redness, or irritation at site where injected  tiredness This list may not describe all possible side effects. Call your doctor for medical advice about side effects. You may report side effects to FDA at 1-800-FDA-1088. Where should I keep my medicine? This drug is given in a hospital or clinic and will not be stored at home. NOTE: This sheet is a summary. It may not cover all possible information. If you have questions about this medicine, talk to your doctor, pharmacist, or health care provider.  2020 [employer] Standard (2016-12-22 13:14:55) Carboplatin injection What is this medicine? CARBOPLATIN (KAR boe pla tin) is a chemotherapy drug. It targets fast dividing cells, like cancer cells, and causes these cells to die. This medicine is used to treat ovarian cancer and many other cancers. This medicine may be used for other purposes; ask your health care provider or pharmacist if you have questions. COMMON BRAND NAME(S): Paraplatin What should I tell my health care provider before I take this medicine? They need to   know if you have any of these conditions:  blood disorders  hearing problems  kidney disease  recent or ongoing radiation therapy  an unusual or allergic reaction to carboplatin, cisplatin, other chemotherapy, other medicines, foods, dyes, or preservatives  pregnant or trying to get pregnant  breast-feeding How should I use this medicine? This drug is usually given as an infusion into a vein. It is administered in a  hospital or clinic by a specially trained health care professional. Talk to your pediatrician regarding the use of this medicine in children. Special care may be needed. Overdosage: If you think you have taken too much of this medicine contact a poison control center or emergency room at once. NOTE: This medicine is only for you. Do not share this medicine with others. What if I miss a dose? It is important not to miss a dose. Call your doctor or health care professional if you are unable to keep an appointment. What may interact with this medicine?  medicines for seizures  medicines to increase blood counts like filgrastim, pegfilgrastim, sargramostim  some antibiotics like amikacin, gentamicin, neomycin, streptomycin, tobramycin  vaccines Talk to your doctor or health care professional before taking any of these medicines:  acetaminophen  aspirin  ibuprofen  ketoprofen  naproxen This list may not describe all possible interactions. Give your health care provider a list of all the medicines, herbs, non-prescription drugs, or dietary supplements you use. Also tell them if you smoke, drink alcohol, or use illegal drugs. Some items may interact with your medicine. What should I watch for while using this medicine? Your condition will be monitored carefully while you are receiving this medicine. You will need important blood work done while you are taking this medicine. This drug may make you feel generally unwell. This is not uncommon, as chemotherapy can affect healthy cells as well as cancer cells. Report any side effects. Continue your course of treatment even though you feel ill unless your doctor tells you to stop. In some cases, you may be given additional medicines to help with side effects. Follow all directions for their use. Call your doctor or health care professional for advice if you get a fever, chills or sore throat, or other symptoms of a cold or flu. Do not treat  yourself. This drug decreases your body's ability to fight infections. Try to avoid being around people who are sick. This medicine may increase your risk to bruise or bleed. Call your doctor or health care professional if you notice any unusual bleeding. Be careful brushing and flossing your teeth or using a toothpick because you may get an infection or bleed more easily. If you have any dental work done, tell your dentist you are receiving this medicine. Avoid taking products that contain aspirin, acetaminophen, ibuprofen, naproxen, or ketoprofen unless instructed by your doctor. These medicines may hide a fever. Do not become pregnant while taking this medicine. Women should inform their doctor if they wish to become pregnant or think they might be pregnant. There is a potential for serious side effects to an unborn child. Talk to your health care professional or pharmacist for more information. Do not breast-feed an infant while taking this medicine. What side effects may I notice from receiving this medicine? Side effects that you should report to your doctor or health care professional as soon as possible:  allergic reactions like skin rash, itching or hives, swelling of the face, lips, or tongue  signs of infection - fever or   chills, cough, sore throat, pain or difficulty passing urine  signs of decreased platelets or bleeding - bruising, pinpoint red spots on the skin, black, tarry stools, nosebleeds  signs of decreased red blood cells - unusually weak or tired, fainting spells, lightheadedness  breathing problems  changes in hearing  changes in vision  chest pain  high blood pressure  low blood counts - This drug may decrease the number of white blood cells, red blood cells and platelets. You may be at increased risk for infections and bleeding.  nausea and vomiting  pain, swelling, redness or irritation at the injection site  pain, tingling, numbness in the hands or  feet  problems with balance, talking, walking  trouble passing urine or change in the amount of urine Side effects that usually do not require medical attention (report to your doctor or health care professional if they continue or are bothersome):  hair loss  loss of appetite  metallic taste in the mouth or changes in taste This list may not describe all possible side effects. Call your doctor for medical advice about side effects. You may report side effects to FDA at 1-800-FDA-1088. Where should I keep my medicine? This drug is given in a hospital or clinic and will not be stored at home. NOTE: This sheet is a summary. It may not cover all possible information. If you have questions about this medicine, talk to your doctor, pharmacist, or health care provider.  2020 [employer]/Gold Standard (2007-07-26 14:38:05) Pegfilgrastim injection What is this medicine? PEGFILGRASTIM (PEG fil gra stim) is a long-acting granulocyte colony-stimulating factor that stimulates the growth of neutrophils, a type of white blood cell important in the body's fight against infection. It is used to reduce the incidence of fever and infection in patients with certain types of cancer who are receiving chemotherapy that affects the bone marrow, and to increase survival after being exposed to high doses of radiation. This medicine may be used for other purposes; ask your health care provider or pharmacist if you have questions. COMMON BRAND NAME(S): Steve Rattler, Ziextenzo What should I tell my health care provider before I take this medicine? They need to know if you have any of these conditions:  kidney disease  latex allergy  ongoing radiation therapy  sickle cell disease  skin reactions to acrylic adhesives (On-Body Injector only)  an unusual or allergic reaction to pegfilgrastim, filgrastim, other medicines, foods, dyes, or preservatives  pregnant or trying to get  pregnant  breast-feeding How should I use this medicine? This medicine is for injection under the skin. If you get this medicine at home, you will be taught how to prepare and give the pre-filled syringe or how to use the On-body Injector. Refer to the patient Instructions for Use for detailed instructions. Use exactly as directed. Tell your healthcare provider immediately if you suspect that the On-body Injector may not have performed as intended or if you suspect the use of the On-body Injector resulted in a missed or partial dose. It is important that you put your used needles and syringes in a special sharps container. Do not put them in a trash can. If you do not have a sharps container, call your pharmacist or healthcare provider to get one. Talk to your pediatrician regarding the use of this medicine in children. While this drug may be prescribed for selected conditions, precautions do apply. Overdosage: If you think you have taken too much of this medicine contact a poison control center  or emergency room at once. NOTE: This medicine is only for you. Do not share this medicine with others. What if I miss a dose? It is important not to miss your dose. Call your doctor or health care professional if you miss your dose. If you miss a dose due to an On-body Injector failure or leakage, a new dose should be administered as soon as possible using a single prefilled syringe for manual use. What may interact with this medicine? Interactions have not been studied. Give your health care provider a list of all the medicines, herbs, non-prescription drugs, or dietary supplements you use. Also tell them if you smoke, drink alcohol, or use illegal drugs. Some items may interact with your medicine. This list may not describe all possible interactions. Give your health care provider a list of all the medicines, herbs, non-prescription drugs, or dietary supplements you use. Also tell them if you smoke, drink  alcohol, or use illegal drugs. Some items may interact with your medicine. What should I watch for while using this medicine? You may need blood work done while you are taking this medicine. If you are going to need a MRI, CT scan, or other procedure, tell your doctor that you are using this medicine (On-Body Injector only). What side effects may I notice from receiving this medicine? Side effects that you should report to your doctor or health care professional as soon as possible:  allergic reactions like skin rash, itching or hives, swelling of the face, lips, or tongue  back pain  dizziness  fever  pain, redness, or irritation at site where injected  pinpoint red spots on the skin  red or dark-brown urine  shortness of breath or breathing problems  stomach or side pain, or pain at the shoulder  swelling  tiredness  trouble passing urine or change in the amount of urine Side effects that usually do not require medical attention (report to your doctor or health care professional if they continue or are bothersome):  bone pain  muscle pain This list may not describe all possible side effects. Call your doctor for medical advice about side effects. You may report side effects to FDA at 1-800-FDA-1088. Where should I keep my medicine? Keep out of the reach of children. If you are using this medicine at home, you will be instructed on how to store it. Throw away any unused medicine after the expiration date on the label. NOTE: This sheet is a summary. It may not cover all possible information. If you have questions about this medicine, talk to your doctor, pharmacist, or health care provider.  2020 [employer] Standard (2017-07-26 16:57:08)

## 2019-12-08 ENCOUNTER — Other Ambulatory Visit: Payer: Self-pay

## 2019-12-08 ENCOUNTER — Encounter: Payer: Self-pay | Admitting: Cardiology

## 2019-12-08 ENCOUNTER — Ambulatory Visit (INDEPENDENT_AMBULATORY_CARE_PROVIDER_SITE_OTHER): Payer: Medicare Other | Admitting: Cardiology

## 2019-12-08 VITALS — BP 102/70 | HR 69 | Ht 65.0 in

## 2019-12-08 DIAGNOSIS — I10 Essential (primary) hypertension: Secondary | ICD-10-CM | POA: Diagnosis not present

## 2019-12-08 DIAGNOSIS — I4819 Other persistent atrial fibrillation: Secondary | ICD-10-CM | POA: Diagnosis not present

## 2019-12-08 MED ORDER — ATENOLOL 50 MG PO TABS: 25.0000 mg | ORAL_TABLET | Freq: Every day | ORAL | 0 refills | Status: DC

## 2019-12-08 MED ORDER — ASPIRIN EC 81 MG PO TBEC
81.0000 mg | DELAYED_RELEASE_TABLET | Freq: Every day | ORAL | 3 refills | Status: DC
Start: 2019-12-08 — End: 2019-12-22

## 2019-12-08 NOTE — Progress Notes (Signed)
Cardiology Office Note:    Date:  12/08/2019   ID:  Brittney Shepard, DOB 11-Jun-1950, MRN [id number]  PCP:  Marguerita Merles, MD  [facility] Cardiologist:  Kate Sable, MD  [place] Electrophysiologist:  None   Referring MD: Marguerita Merles, MD   Chief Complaint  Patient presents with  . Follow-up    Hospital follow for Afib. Medications verbally reviewed with patient.     History of Present Illness:    Brittney Shepard is a 69 y.o. female with a hx of persistent A. fib, hypertension, bladder tumor status TURBT 11/2019 who presents for follow-up.  Patient admitted to the hospital on 11/18/2019 after a fall.  She was noted to be in atrial fibrillation with rapid ventricular response.  Rate control was performed with beta-blockers and calcium channel blockers.  Anticoagulation was discussed but patient wanted to continue on aspirin despite CHA2DS2-VASc of 5.  She was diagnosed with a bladder tumor and was undergoing procedure , hence also preventing anticoagulation initiation.  Patient had biopsy performed to bladder tumor.  Results are still pending.  She takes aspirin 81 mg daily.  Denies any bleeding issues.  She states her blood pressures have been running a little low.    Past Medical History:  Diagnosis Date  . A-fib ([place])   . Bladder cancer ([place])   . CHF (congestive heart failure) ([place])   . Chronic pain   . Cognitive communication deficit   . Diabetes mellitus without complication ([place])   . Hypertension   . Hypokalemia   . Muscle weakness (generalized)   . 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 Secondary malignant neoplasm of liver ([place])   . Stroke [facility]) 2013  . Urinary tract infection, site not specified     Past Surgical History:  Procedure Laterality Date  . CYSTOSCOPY W/ RETROGRADES N/A 11/20/2019   Procedure: CYSTOSCOPY WITH RETROGRADE PYELOGRAM;  Surgeon: Billey Co, MD;  Location: [facility] ORS;  Service: Urology;  Laterality: N/A;  .  CYSTOSCOPY WITH STENT PLACEMENT Left 11/20/2019   Procedure: CYSTOSCOPY WITH STENT PLACEMENT;  Surgeon: Billey Co, MD;  Location: [facility] ORS;  Service: Urology;  Laterality: Left;  . TRANSURETHRAL RESECTION OF BLADDER TUMOR N/A 11/20/2019   Procedure: TRANSURETHRAL RESECTION OF BLADDER TUMOR (TURBT);  Surgeon: Billey Co, MD;  Location: [facility] ORS;  Service: Urology;  Laterality: N/A;    Current Medications: Current Meds  Medication Sig  . acetaminophen (TYLENOL) 325 MG tablet Take 2 tablets (650 mg total) by mouth every 6 (six) hours as needed for mild pain (or Fever >/= 101).  Marland Kitchen albuterol (VENTOLIN HFA) 108 (90 Base) MCG/ACT inhaler Inhale 2 puffs into the lungs every 6 (six) hours as needed for wheezing or shortness of breath.  Marland Kitchen atenolol (TENORMIN) 50 MG tablet Take 0.5 tablets (25 mg total) by mouth daily.  . barrier cream (NON-SPECIFIED) CREA Apply 1 application topically 2 (two) times daily as needed.  . diltiazem (CARDIZEM SR) 60 MG 12 hr capsule Take 1 capsule (60 mg total) by mouth every 12 (twelve) hours.  . furosemide (LASIX) 40 MG tablet Take 1 tablet (40 mg total) by mouth 2 (two) times daily.  Marland Kitchen ipratropium-albuterol (DUONEB) 0.5-2.5 (3) MG/3ML SOLN Take 3 mLs by nebulization 3 (three) times daily.  . metFORMIN (GLUCOPHAGE) 500 MG tablet Take 500 mg by mouth 2 (two) times daily with a meal.   . Multiple Vitamin (MULTIVITAMIN) tablet Take 1 tablet by mouth daily.  Marland Kitchen  nystatin (NYSTATIN) powder Apply 1 application topically 2 (two) times daily.  . polyethylene glycol (MIRALAX / GLYCOLAX) 17 g packet Take 17 g by mouth daily as needed for mild constipation.  . potassium chloride (KLOR-CON) 10 MEQ tablet Take 10 mEq by mouth 2 (two) times daily.  Marland Kitchen senna-docusate (SENOKOT-S) 8.6-50 MG tablet Take 1 tablet by mouth 2 (two) times daily.  . [DISCONTINUED] atenolol (TENORMIN) 50 MG tablet Take 1 tablet (50 mg total) by mouth daily.  . [DISCONTINUED] atenolol (TENORMIN) 50 MG  tablet Take 0.5 tablets (25 mg total) by mouth daily.     Allergies:   Penicillin g and Sulfa antibiotics   Social History   Socioeconomic History  . Marital status: Widowed    Spouse name: Not on file  . Number of children: Not on file  . Years of education: Not on file  . Highest education level: Not on file  Occupational History  . Not on file  Tobacco Use  . Smoking status: Never Smoker  . Smokeless tobacco: Never Used  Substance and Sexual Activity  . Alcohol use: No  . Drug use: No  . Sexual activity: Not Currently  Other Topics Concern  . Not on file  Social History Narrative   Retd. 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 Has one son.    Social Determinants of Health   Financial Resource Strain:   . Difficulty of Paying Living Expenses:   Food Insecurity:   . Worried About Charity fundraiser in the Last Year:   . Arboriculturist in the Last Year:   Transportation Needs:   . Film/video editor (Medical):   Marland Kitchen Lack of Transportation (Non-Medical):   Physical Activity:   . Days of Exercise per Week:   . Minutes of Exercise per Session:   Stress:   . Feeling of Stress :   Social Connections:   . Frequency of Communication with Friends and Family:   . Frequency of Social Gatherings with Friends and Family:   . Attends Religious Services:   . Active Member of Clubs or Organizations:   . Attends Archivist Meetings:   Marland Kitchen Marital Status:      Family History: The patient's family history includes Cancer in her sister; Ovarian cancer in her mother.  ROS:   Please see the history of present illness.     All other systems reviewed and are negative.  EKGs/Labs/Other Studies Reviewed:    The following studies were reviewed today:   EKG:  EKG is  ordered today.  The ekg ordered today demonstrates atrial fibrillation, heart rate 69  Recent Labs: 11/18/2019: B Natriuretic Peptide 246.5; Magnesium 2.1; TSH 1.372 12/04/2019: ALT  16; BUN 23; Creatinine, Ser 0.97; Hemoglobin 12.6; Platelets 286; Potassium 3.6; Sodium 134  Recent Lipid Panel    Component Value Date/Time   CHOL 120 03/22/2016 0601   TRIG 102 03/22/2016 0601   HDL 29 (L) 03/22/2016 0601   CHOLHDL 4.1 03/22/2016 0601   VLDL 20 03/22/2016 0601   LDLCALC 71 03/22/2016 0601    Physical Exam:    VS:  BP 102/70 (BP Location: Left Arm, Patient Position: Sitting, Cuff Size: Large)   Pulse 69   Ht 5\' 5"  (1.651 m)   SpO2 96%   BMI 48.59 kg/m     Wt Readings from Last 3 Encounters:  12/04/19 (!) 292 lb (132.5 kg)  12/04/19 (!) 292 lb (132.5 kg)  11/22/19  292 lb 12.3 oz (132.8 kg)     GEN:  Well nourished, well developed in no acute distress HEENT: Normal NECK: No JVD; No carotid bruits LYMPHATICS: No lymphadenopathy CARDIAC: Irregular irregular, no murmurs RESPIRATORY: Clear anteriorly ABDOMEN: Soft, non-tender, non-distended MUSCULOSKELETAL:  2+ lymphedema/ edema; No deformity  SKIN: Warm and dry NEUROLOGIC:  Alert and oriented x 3 PSYCHIATRIC:  Normal affect   ASSESSMENT:    1. Persistent atrial fibrillation ([place])   2. Hypertension, unspecified type   3. Morbid obesity ([place])    PLAN:    In order of problems listed above:  1. Persistent atrial fibrillation, CHA2DS2-VASc score 5.  Heart rate controlled.  Patient declined starting anticoagulation, takes aspirin 81 mg daily.  Continue Cardizem, decrease atenolol to 25 mg daily.  Recent echocardiogram showed EF 55%. 2. History of hypertension, blood pressures today low normal.  Decrease atenolol to 25 mg daily.  If blood pressure still low at follow-up visit, will consider stopping. 3. Morbidly obese, weight loss recommended  Total encounter time 40 minutes  Greater than 50% was spent in counseling and coordination of care with the patient    Medication Adjustments/Labs and Tests Ordered: Current medicines are reviewed at length with the patient today.  Concerns regarding medicines  are outlined above.  Orders Placed This Encounter  Procedures  . EKG 12-Lead   Meds ordered this encounter  Medications  . DISCONTD: atenolol (TENORMIN) 50 MG tablet    Sig: Take 0.5 tablets (25 mg total) by mouth daily.    Dispense:  15 tablet    Refill:  0  . aspirin EC 81 MG tablet    Sig: Take 1 tablet (81 mg total) by mouth daily. Swallow whole.    Dispense:  90 tablet    Refill:  3  . atenolol (TENORMIN) 50 MG tablet    Sig: Take 0.5 tablets (25 mg total) by mouth daily.    Dispense:  15 tablet    Refill:  0    Patient Instructions  Medication Instructions:   Your physician has recommended you make the following change in your medication:   DECREASE your atenolol (TENORMIN) 50 MG tablet: Take 0.5 tablets (25 mg total) by mouth daily.  *If you need a refill on your cardiac medications before your next appointment, please call your pharmacy*   Lab Work: None Ordered If you have labs (blood work) drawn today and your tests are completely normal, you will receive your results only by: Marland Kitchen MyChart Message (if you have MyChart) OR . A paper copy in the mail If you have any lab test that is abnormal or we need to change your treatment, we will call you to review the results.   Testing/Procedures: None Ordered   Follow-Up: At [facility], you and your health needs are our priority.  As part of our continuing mission to provide you with exceptional heart care, we have created designated Provider Care Teams.  These Care Teams include your primary Cardiologist (physician) and Advanced Practice Providers (APPs -  Physician Assistants and Nurse Practitioners) who all work together to provide you with the care you need, when you need it.  We recommend signing up for the patient portal called "MyChart".  Sign up information is provided on this After Visit Summary.  MyChart is used to connect with patients for Virtual Visits (Telemedicine).  Patients are able to view lab/test  results, encounter notes, upcoming appointments, etc.  Non-urgent messages can be sent to your provider as  well.   To learn more about what you can do with MyChart, go to [email or web address].    Your next appointment:   2 month(s)  The format for your next appointment:   In Person  Provider:   Kate Sable, MD   Other Instructions      Signed, Kate Sable, MD  12/08/2019 1:22 PM    [place]

## 2019-12-08 NOTE — Patient Instructions (Signed)
Medication Instructions:   Your physician has recommended you make the following change in your medication:   DECREASE your atenolol (TENORMIN) 50 MG tablet: Take 0.5 tablets (25 mg total) by mouth daily.  *If you need a refill on your cardiac medications before your next appointment, please call your pharmacy*   Lab Work: None Ordered If you have labs (blood work) drawn today and your tests are completely normal, you will receive your results only by: Marland Kitchen MyChart Message (if you have MyChart) OR . A paper copy in the mail If you have any lab test that is abnormal or we need to change your treatment, we will call you to review the results.   Testing/Procedures: None Ordered   Follow-Up: At [facility], you and your health needs are our priority.  As part of our continuing mission to provide you with exceptional heart care, we have created designated Provider Care Teams.  These Care Teams include your primary Cardiologist (physician) and Advanced Practice Providers (APPs -  Physician Assistants and Nurse Practitioners) who all work together to provide you with the care you need, when you need it.  We recommend signing up for the patient portal called "MyChart".  Sign up information is provided on this After Visit Summary.  MyChart is used to connect with patients for Virtual Visits (Telemedicine).  Patients are able to view lab/test results, encounter notes, upcoming appointments, etc.  Non-urgent messages can be sent to your provider as well.   To learn more about what you can do with MyChart, go to [email or web address].    Your next appointment:   2 month(s)  The format for your next appointment:   In Person  Provider:   Kate Sable, MD   Other Instructions

## 2019-12-11 ENCOUNTER — Inpatient Hospital Stay: Payer: Medicare Other

## 2019-12-11 ENCOUNTER — Encounter: Payer: Self-pay | Admitting: Internal Medicine

## 2019-12-11 ENCOUNTER — Telehealth: Payer: Self-pay

## 2019-12-11 ENCOUNTER — Inpatient Hospital Stay (HOSPITAL_BASED_OUTPATIENT_CLINIC_OR_DEPARTMENT_OTHER): Payer: Medicare Other | Admitting: Oncology

## 2019-12-11 ENCOUNTER — Other Ambulatory Visit: Payer: Self-pay

## 2019-12-11 DIAGNOSIS — C801 Malignant (primary) neoplasm, unspecified: Secondary | ICD-10-CM | POA: Diagnosis not present

## 2019-12-11 DIAGNOSIS — C541 Malignant neoplasm of endometrium: Secondary | ICD-10-CM | POA: Diagnosis not present

## 2019-12-11 DIAGNOSIS — C787 Secondary malignant neoplasm of liver and intrahepatic bile duct: Secondary | ICD-10-CM

## 2019-12-11 NOTE — Telephone Encounter (Signed)
Met with Ms. Niemann following chemo education class in the lobby. Rescheduled appointment with gyn oncology following PET scan on 8/11 at 1000. Pet is scheduled for 8/10 at 1030 with arrival at 1000. Called [facility] and notified of new gyn oncology appointment. Confirmed they have PET scan details.

## 2019-12-11 NOTE — Progress Notes (Signed)
[name]  Telephone:([phone] [phone]  Patient Care Team: Marguerita Merles, MD as PCP - General (Family Medicine) Kate Sable, MD as PCP - Cardiology (Cardiology)   Name of the patient: Brittney Shepard  [id number]  1950/06/27   Date of visit: 12/12/19  Diagnosis-GYN cancer  Chief complaint/Reason for visit- Initial Meeting for [facility], preparing for starting chemotherapy  Heme/Onc history:  Oncology History Overview Note  # 2021- Liver bx-poorly differentiated CK 7+ carcinoma- DIAGNOSIS:  A. LIVER; ULTRASOUND-GUIDED CORE NEEDLE BIOPSY:  - POSITIVE FOR MALIGNANCY.  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   Comment:  Immunohistochemical studies show tumor cells to be positive for CK7 and  CK19, with focal faint expression of CD20. Tumor cells are negative for  CDX-2, GATA-3, TTF-1, and Pax-8. Additional stains were attempted, but  malignant tissue is exhausted in the core utilized for these studies.   The immunohistochemical pattern is relatively non-specific. The  patient's recent diagnosis of invasive urothelial carcinoma is noted,  but the histologic pattern does not appear similar, and lack of staining  for GATA3 would suggest against this. Concern for a gynecologic  malignancy is also noted, but lack of marking for Pax-8 would suggest  against this. CK7 positivity may be seen in carcinomas arising in the  upper GI tract, lung, pancreatobiliary tree, and thymus, among other  sites. Lack of marking for TTF-1 would suggest against lung. The  presence of CK19 staining, while not specific, may be seen in carcinomas  arising from the pancreatobiliary tree, as well as urinary bladder,  endometrium, lung, and kidney. Recent laboratory testing indicates liver  function testing within normal limits. Clinical and radiographic  correlation is recommended.   # JULY 2021- cystocopy- DIAGNOSIS:  A. 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  - NO MUSCULARIS PROPRIA IDENTIFIED.   B. 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  - MUSCULARIS PROPRIA PRESENT AND UNINVOLVED.   # ENDOMETRIAL MASS [Dr.Staebler]- unable to Bx- sec to cervical stenosis  # Mediastinal mass  Lobulated anterior mediastinal 4.1 x 3.6 cm soft tissue mass, indeterminate for nodal metastasis versus primary thymic neoplasm. No additional potential findings of metastatic disease in the chest. 3. Mild cardiomegaly. Three-vessel coronary atherosclerosis. 4. Left adrenal myelolipoma. 5. Exophytic isodense 1.3 cm renal cortical lesion in the lateral upper right kidney  # CHF-compensated; morbid obesity; diabetes; COPD [not on O2]; A. Fib- not on anticoagulation.    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[place])   12/04/2019 -  Chemotherapy   The patient had DEXAMETHASONE 4 MG PO TABS, 8 mg, Oral, Daily, 0 of 1 cycle, Start date: --, End date: -- PALONOSETRON HCL INJECTION 0.25 MG/5ML, 0.25 mg, Intravenous,  Once, 0 of 3 cycles PEGFILGRASTIM-JMDB 6 MG/0.6ML [place] SOSY, 6 mg, Subcutaneous,  Once, 0 of 3 cycles CARBOPLATIN CHEMO IV INFUSION ORDERABLE (BY AUC), , Intravenous,  Once, 0 of 3 cycles FOSAPREPITANT IV INFUSION 150 MG, 150 mg, Intravenous,  Once, 0 of 3 cycles PACLITAXEL CHEMO IV INFUSION ORDERABLE (> 80 MG/M2), 225 mg/m2, Intravenous,  Once, 0 of 3 cycles  for chemotherapy treatment.    Liver metastases ([place])   Initial Diagnosis   Liver metastases ([place])   12/04/2019 -  Chemotherapy   The patient had DEXAMETHASONE 4  MG PO TABS, 8 mg, Oral, Daily, 0 of 1 cycle, Start date: --, End date: -- PALONOSETRON HCL INJECTION 0.25 MG/5ML,  0.25 mg, Intravenous,  Once, 0 of 3 cycles PEGFILGRASTIM-JMDB 6 MG/0.6ML [place] SOSY, 6 mg, Subcutaneous,  Once, 0 of 3 cycles CARBOPLATIN CHEMO IV INFUSION ORDERABLE (BY AUC), , Intravenous,  Once, 0 of 3 cycles FOSAPREPITANT IV INFUSION 150 MG, 150 mg, Intravenous,  Once, 0 of 3 cycles PACLITAXEL CHEMO IV INFUSION ORDERABLE (> 80 MG/M2), 225 mg/m2, Intravenous,  Once, 0 of 3 cycles  for chemotherapy treatment.      Interval history-Brittney Shepard is a 69 year old female who presents to chemo care clinic today for initial meeting in preparation for starting chemotherapy. I introduced the chemo care clinic and we discussed that the role of the clinic is to assist those who are at an increased risk of emergency room visits and/or complications during the course of chemotherapy treatment. We discussed that the increased risk takes into account factors such as age, performance status, and co-morbidities. We also discussed that for some, this might include barriers to care such as not having a primary care provider, lack of insurance/transportation, or not being able to afford medications. We discussed that the goal of the program is to help prevent unplanned ER visits and help reduce complications during chemotherapy. We do this by discussing specific risk factors to each individual and identifying ways that we can help improve these risk factors and reduce barriers to care.   ECOG FS:2 - Symptomatic, <50% confined to bed  Review of systems- Review of Systems  Constitutional: Positive for malaise/fatigue and weight loss. Negative for chills and fever.  HENT: Negative for congestion, ear pain and tinnitus.   Eyes: Negative.  Negative for blurred vision and double vision.  Respiratory: Negative.  Negative for cough, sputum production and shortness of breath.   Cardiovascular: Negative.  Negative for chest pain, palpitations and leg swelling.  Gastrointestinal: Negative.  Negative for abdominal pain,  constipation, diarrhea, nausea and vomiting.  Genitourinary: Negative for dysuria, frequency and urgency.  Musculoskeletal: Positive for back pain and joint pain. Negative for falls.  Skin: Negative.  Negative for rash.  Neurological: Positive for weakness. Negative for headaches.  Endo/Heme/Allergies: Negative.  Does not bruise/bleed easily.  Psychiatric/Behavioral: Negative.  Negative for depression. The patient is not nervous/anxious and does not have insomnia.      Current treatment-carbotaxol  Allergies  Allergen Reactions  . Penicillin G Hives  . Sulfa Antibiotics Rash    Past Medical History:  Diagnosis Date  . A-fib ([place])   . Bladder cancer ([place])   . CHF (congestive heart failure) ([place])   . Chronic pain   . Cognitive communication deficit   . Diabetes mellitus without complication ([place])   . Hypertension   . Hypokalemia   . Muscle weakness (generalized)   . 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 Secondary malignant neoplasm of liver ([place])   . Stroke [facility]) 2013  . Urinary tract infection, site not specified     Past Surgical History:  Procedure Laterality Date  . CYSTOSCOPY W/ RETROGRADES N/A 11/20/2019   Procedure: CYSTOSCOPY WITH RETROGRADE PYELOGRAM;  Surgeon: Billey Co, MD;  Location: [facility] ORS;  Service: Urology;  Laterality: N/A;  . CYSTOSCOPY WITH STENT PLACEMENT Left 11/20/2019   Procedure: CYSTOSCOPY WITH STENT PLACEMENT;  Surgeon: Billey Co, MD;  Location: [facility] ORS;  Service: Urology;  Laterality: Left;  . TRANSURETHRAL RESECTION OF BLADDER TUMOR N/A  11/20/2019   Procedure: TRANSURETHRAL RESECTION OF BLADDER TUMOR (TURBT);  Surgeon: Billey Co, MD;  Location: [facility] ORS;  Service: Urology;  Laterality: N/A;    Social History   Socioeconomic History  . Marital status: Widowed    Spouse name: Not on file  . Number of children: Not on file  . Years of education: Not on file  . Highest education level: Not on file  Occupational  History  . Not on file  Tobacco Use  . Smoking status: Never Smoker  . Smokeless tobacco: Never Used  Substance and Sexual Activity  . Alcohol use: No  . Drug use: No  . Sexual activity: Not Currently  Other Topics Concern  . Not on file  Social History Narrative   Retd. 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 Has one son.    Social Determinants of Health   Financial Resource Strain:   . Difficulty of Paying Living Expenses:   Food Insecurity:   . Worried About Charity fundraiser in the Last Year:   . Arboriculturist in the Last Year:   Transportation Needs:   . Film/video editor (Medical):   Marland Kitchen Lack of Transportation (Non-Medical):   Physical Activity:   . Days of Exercise per Week:   . Minutes of Exercise per Session:   Stress:   . Feeling of Stress :   Social Connections:   . Frequency of Communication with Friends and Family:   . Frequency of Social Gatherings with Friends and Family:   . Attends Religious Services:   . Active Member of Clubs or Organizations:   . Attends Archivist Meetings:   Marland Kitchen Marital Status:   Intimate Partner Violence:   . Fear of Current or Ex-Partner:   . Emotionally Abused:   Marland Kitchen Physically Abused:   . Sexually Abused:     Family History  Problem Relation Age of Onset  . Ovarian cancer Mother   . Cancer Sister      Current Outpatient Medications:  .  acetaminophen (TYLENOL) 325 MG tablet, Take 2 tablets (650 mg total) by mouth every 6 (six) hours as needed for mild pain (or Fever >/= 101)., Disp: 60 tablet, Rfl: 0 .  albuterol (VENTOLIN HFA) 108 (90 Base) MCG/ACT inhaler, Inhale 2 puffs into the lungs every 6 (six) hours as needed for wheezing or shortness of breath., Disp: , Rfl:  .  aspirin EC 81 MG tablet, Take 1 tablet (81 mg total) by mouth daily. Swallow whole., Disp: 90 tablet, Rfl: 3 .  atenolol (TENORMIN) 50 MG tablet, Take 0.5 tablets (25 mg total) by mouth daily., Disp: 15 tablet, Rfl:  0 .  barrier cream (NON-SPECIFIED) CREA, Apply 1 application topically 2 (two) times daily as needed., Disp: , Rfl:  .  diltiazem (CARDIZEM SR) 60 MG 12 hr capsule, Take 1 capsule (60 mg total) by mouth every 12 (twelve) hours., Disp: 60 capsule, Rfl: 0 .  furosemide (LASIX) 40 MG tablet, Take 1 tablet (40 mg total) by mouth 2 (two) times daily., Disp: 30 tablet, Rfl: 0 .  ipratropium-albuterol (DUONEB) 0.5-2.5 (3) MG/3ML SOLN, Take 3 mLs by nebulization 3 (three) times daily., Disp: 360 mL, Rfl: 0 .  metFORMIN (GLUCOPHAGE) 500 MG tablet, Take 500 mg by mouth 2 (two) times daily with a meal. , Disp: , Rfl:  .  Multiple Vitamin (MULTIVITAMIN) tablet, Take 1 tablet by mouth daily., Disp: , Rfl:  .  nystatin (NYSTATIN) powder, Apply 1 application topically 2 (two) times daily., Disp: , Rfl:  .  polyethylene glycol (MIRALAX / GLYCOLAX) 17 g packet, Take 17 g by mouth daily as needed for mild constipation., Disp: 14 each, Rfl: 0 .  potassium chloride (KLOR-CON) 10 MEQ tablet, Take 10 mEq by mouth 2 (two) times daily., Disp: , Rfl:  .  senna-docusate (SENOKOT-S) 8.6-50 MG tablet, Take 1 tablet by mouth 2 (two) times daily., Disp: 60 tablet, Rfl: 0  Physical exam: There were no vitals filed for this visit. Physical Exam Constitutional:      Appearance: Normal appearance.  HENT:     Head: Normocephalic and atraumatic.  Eyes:     Pupils: Pupils are equal, round, and reactive to light.  Cardiovascular:     Rate and Rhythm: Normal rate and regular rhythm.     Heart sounds: Normal heart sounds. No murmur heard.   Pulmonary:     Effort: Pulmonary effort is normal.     Breath sounds: Normal breath sounds. No wheezing.  Abdominal:     General: Bowel sounds are normal. There is no distension.     Palpations: Abdomen is soft.     Tenderness: There is no abdominal tenderness.  Musculoskeletal:        General: Normal range of motion.     Cervical back: Normal range of motion.  Skin:    General: Skin  is warm and dry.     Findings: No rash.  Neurological:     Mental Status: She is alert and oriented to person, place, and time.  Psychiatric:        Judgment: Judgment normal.      CMP Latest Ref Rng & Units 12/04/2019  Glucose 70 - 99 mg/dL 175(H)  BUN 8 - 23 mg/dL 23  Creatinine 0.44 - 1.00 mg/dL 0.97  Sodium 135 - 145 mmol/L 134(L)  Potassium 3.5 - 5.1 mmol/L 3.6  Chloride 98 - 111 mmol/L 94(L)  CO2 22 - 32 mmol/L 28  Calcium 8.9 - 10.3 mg/dL 8.0(L)  Total Protein 6.5 - 8.1 g/dL 7.4  Total Bilirubin 0.3 - 1.2 mg/dL 1.0  Alkaline Phos 38 - 126 U/L 87  AST 15 - 41 U/L 24  ALT 0 - 44 U/L 16   CBC Latest Ref Rng & Units 12/04/2019  WBC 4.0 - 10.5 K/uL 9.1  Hemoglobin 12.0 - 15.0 g/dL 12.6  Hematocrit 36 - 46 % 39.7  Platelets 150 - 400 K/uL 286    No images are attached to the encounter.  DG Tibia/Fibula Right  Result Date: 11/18/2019 CLINICAL DATA:  Status post fall. Right lower leg pain. Initial encounter. EXAM: RIGHT TIBIA AND FIBULA - 2 VIEW COMPARISON:  None. FINDINGS: No acute bony or joint abnormality. Severe knee and talonavicular osteoarthritis noted. Soft tissues are unremarkable. IMPRESSION: No acute abnormality. Electronically Signed   By: Inge Rise M.D.   On: 11/18/2019 10:16   CT CHEST WO CONTRAST  Result Date: 11/21/2019 CLINICAL DATA:  Inpatient. New diagnosis of bladder cancer. Endometrial mass. Liver mass. Chest staging. EXAM: CT CHEST WITHOUT CONTRAST TECHNIQUE: Multidetector CT imaging of the chest was performed following the standard protocol without IV contrast. COMPARISON:  11/29/2019 MRI abdomen/pelvis. 11/19/2019 CT abdomen/pelvis. 11/18/2019 chest radiograph. FINDINGS: Motion degraded scan, particularly at the lung bases, limiting assessment. Cardiovascular: Mild cardiomegaly. No significant pericardial effusion/thickening. Three-vessel coronary atherosclerosis. Atherosclerotic nonaneurysmal thoracic aorta. Top-normal caliber main pulmonary artery (3  point cm diameter). Mediastinum/Nodes: No discrete thyroid  nodules. Unremarkable esophagus. No axillary adenopathy. Lobulated anterior mediastinal 4.1 x 3.6 cm soft tissue mass (series 2/image 46). Otherwise no pathologically enlarged mediastinal or discrete hilar nodes on these noncontrast images. Lungs/Pleura: No pneumothorax. No pleural effusion. No acute consolidative airspace disease, lung masses or significant pulmonary nodules. 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 Exophytic isodense 1.3 cm renal cortical lesion in the lateral upper right kidney (series 2/image 145), not well characterized on MRI from earlier today due to motion degradation. Musculoskeletal: No aggressive appearing focal osseous lesions. Moderate thoracic spondylosis. IMPRESSION: 1. Limited motion degraded scan. 2. 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 No additional potential findings of metastatic disease in the chest. 3. Mild cardiomegaly. Three-vessel coronary atherosclerosis. 4. Left adrenal myelolipoma. 5. Exophytic isodense 1.3 cm renal cortical lesion in the lateral upper right kidney, not well characterized on MRI from earlier today due to motion degradation. Suggest attention on follow-up CT or MRI abdomen without and with IV contrast in 6-12 months. 6. Aortic Atherosclerosis (ICD10-I70.0). Electronically Signed   By: Ilona Sorrel M.D.   On: 11/21/2019 16:24   MR PELVIS W WO CONTRAST  Result Date: 11/21/2019 CLINICAL DATA:  Bladder and endometrial masses., concern for hepatic metastatic disease EXAM: MRI ABDOMEN AND PELVIS WITHOUT AND WITH CONTRAST TECHNIQUE: Multiplanar multisequence MR imaging of the abdomen and pelvis was performed both before and after the administration of intravenous contrast. CONTRAST:  38mL GADAVIST GADOBUTROL 1 MMOL/ML IV SOLN COMPARISON:  CT evaluation of November 19, 2019 FINDINGS: COMBINED FINDINGS FOR  BOTH MR ABDOMEN AND PELVIS Lower chest: No effusion or consolidation. Cardiomegaly. Heart is incompletely imaged. Hepatobiliary: Mass in hepatic subsegment IV B along the gallbladder fossa showing target like appearance measuring approximately 3.9 x 3.1 cm. (Image 48, series 28) mass abuts the gallbladder. Trace pericholecystic fluid. No significant wall enhancement or pericholecystic stranding. No biliary duct dilation. Small satellite lesions seen just inferior to the dominant mass measuring approximately 1 cm (image 57, series 28) Pancreas: Multi cystic changes throughout the pancreas (image 25, series 1812 mm cystic lesion in the head of the pancreas. Between 10 and 15 additional small cystic foci about the pancreas with pancreatic atrophy and mild main duct distension. An example of another lesion is a 13 mm lesion in the uncinate (image 28, series 18) areas without overtly suspicious features otherwise. Spleen:  Spleen normal in size and contour. 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 No sign of hydronephrosis. Small RIGHT renal cyst. Stomach/Bowel: Gastrointestinal tract is normal to the extent evaluated. Study not performed for bowel evaluation. Vascular/Lymphatic: Patent abdominal vasculature with atherosclerotic plaque in the nonaneurysmal abdominal aorta. Retroaortic LEFT renal vein. Celiac nodal enlargement and portacaval nodal enlargement (image 48, series 27) 12 mm portacaval lymph node adjacent to pancreatic head. (Image 90, series 21) 16 mm celiac lymph node.  IVC is patent. No pelvic adenopathy. Pelvic vasculature not well assessed but grossly patent. Reproductive: Thickening and irregularity of the endometrium measuring approximately 4 cm greatest thickness with cystic changes. Myometrial invasion best seen on sagittal image 23, series 35, there is motion on the current exam which limits assessment but suggestion of invasion at least to 50% of the myometrium, oblique  post-contrast images raise this question as well but obliquity challenges the ability to determine depth of myometrial invasion Urinary bladder is decompressed, large tumor removed by trans urethral resection. Assessment of urinary bladder is limited by under distension. Other:  No ascites. Musculoskeletal: Vertebral hemangiomata and endplate degenerative changes. IMPRESSION: 1. Hepatic mass suspicious for metastatic disease. Given other findings outlined below biopsy may be helpful to determine source of metastasis. 2. Endometrial mass with invasive features. Study limited by motion on the current evaluation but imaging suggests myometrial invasion to at least 50% along the anterior uterus. Given above findings depending on results of additional workup; if depth of myometrial invasion will change patient management a repeat evaluation may be helpful in the outpatient setting. 3. Celiac and portacaval nodal enlargement, suspicious for nodal metastases. 4. Assessment of the urinary bladder is limited by under distension large tumor has been resected. Electronically Signed   By: Zetta Bills M.D.   On: 11/21/2019 08:38   MR ABDOMEN W WO CONTRAST  Result Date: 11/21/2019 CLINICAL DATA:  Bladder and endometrial masses., concern for hepatic metastatic disease EXAM: MRI ABDOMEN AND PELVIS WITHOUT AND WITH CONTRAST TECHNIQUE: Multiplanar multisequence MR imaging of the abdomen and pelvis was performed both before and after the administration of intravenous contrast. CONTRAST:  73mL GADAVIST GADOBUTROL 1 MMOL/ML IV SOLN COMPARISON:  CT evaluation of November 19, 2019 FINDINGS: COMBINED FINDINGS FOR BOTH MR ABDOMEN AND PELVIS Lower chest: No effusion or consolidation. Cardiomegaly. Heart is incompletely imaged. Hepatobiliary: Mass in hepatic subsegment IV B along the gallbladder fossa showing target like appearance measuring approximately 3.9 x 3.1 cm. (Image 48, series 28) mass abuts the gallbladder. Trace pericholecystic  fluid. No significant wall enhancement or pericholecystic stranding. No biliary duct dilation. Small satellite lesions seen just inferior to the dominant mass measuring approximately 1 cm (image 57, series 28) Pancreas: Multi cystic changes throughout the pancreas (image 25, series 1812 mm cystic lesion in the head of the pancreas. Between 10 and 15 additional small cystic foci about the pancreas with pancreatic atrophy and mild main duct distension. An example of another lesion is a 13 mm lesion in the uncinate (image 28, series 18) areas without overtly suspicious features otherwise. Spleen:  Spleen normal in size and contour. 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 No sign of hydronephrosis. Small RIGHT renal cyst. Stomach/Bowel: Gastrointestinal tract is normal to the extent evaluated. Study not performed for bowel evaluation. Vascular/Lymphatic: Patent abdominal vasculature with atherosclerotic plaque in the nonaneurysmal abdominal aorta. Retroaortic LEFT renal vein. Celiac nodal enlargement and portacaval nodal enlargement (image 48, series 27) 12 mm portacaval lymph node adjacent to pancreatic head. (Image 90, series 21) 16 mm celiac lymph node.  IVC is patent. No pelvic adenopathy. Pelvic vasculature not well assessed but grossly patent. Reproductive: Thickening and irregularity of the endometrium measuring approximately 4 cm greatest thickness with cystic changes. Myometrial invasion best seen on sagittal image 23, series 35, there is motion on the current exam which limits assessment but suggestion of invasion at least to 50% of the myometrium, oblique post-contrast images raise this question as well but obliquity challenges the ability to determine depth of myometrial invasion Urinary bladder is decompressed, large tumor removed by trans urethral resection. Assessment of urinary bladder is limited by under distension. Other:  No ascites. Musculoskeletal: Vertebral hemangiomata  and endplate degenerative changes. IMPRESSION: 1. Hepatic mass suspicious for metastatic disease. Given other findings outlined below biopsy may be helpful to determine source of metastasis. 2. Endometrial mass with invasive features. Study limited by motion on the current evaluation but imaging suggests myometrial invasion to at least 50% along the anterior uterus. Given above findings depending on results of additional workup; if depth of myometrial invasion  will change patient management a repeat evaluation may be helpful in the outpatient setting. 3. Celiac and portacaval nodal enlargement, suspicious for nodal metastases. 4. Assessment of the urinary bladder is limited by under distension large tumor has been resected. Electronically Signed   By: Zetta Bills M.D.   On: 11/21/2019 08:38   US Pelvis Complete  Result Date: 11/18/2019 CLINICAL DATA:  Postmenopausal patient with vaginal bleeding. EXAM: TRANSABDOMINAL ULTRASOUND OF PELVIS TECHNIQUE: Transabdominal ultrasound examination of the pelvis was performed including evaluation of the uterus, ovaries, adnexal regions, and pelvic cul-de-sac. COMPARISON:  None. FINDINGS: Uterus Measurements: 11.5 x 6.2 x 7.2 cm = volume: 270 mL. No fibroids or other mass visualized. Endometrium Thickness: 3.5 cm.  No focal abnormality visualized. Right ovary Measurements: Not visualized. Left ovary Measurements: Not visualized. Other findings: Imaging of the urinary bladder demonstrates an echogenic lesion within the bladder measuring 4.3 x 2.8 x 6.5 cm. There is flow within the lesion on Doppler imaging. IMPRESSION: Endometrial thickness is considered abnormal for a post-menopausal female. Endometrial sampling should be considered to exclude carcinoma. Lesion within the urinary bladder with flow on Doppler imaging worrisome for neoplasm. Recommend consult with urology. Electronically Signed   By: Inge Rise M.D.   On: 11/18/2019 14:55   DG Pelvis  Portable  Result Date: 11/18/2019 CLINICAL DATA:  Status post fall.  Pain.  Initial encounter. EXAM: PORTABLE PELVIS 1-2 VIEWS COMPARISON:  None. FINDINGS: There is no evidence of pelvic fracture or diastasis. No pelvic bone lesions are seen. IMPRESSION: Negative exam. Electronically Signed   By: Inge Rise M.D.   On: 11/18/2019 10:16   NM PET Image Initial (PI) Skull Base To Thigh  Result Date: 12/12/2019 CLINICAL DATA:  Initial treatment strategy for metastatic disease of unknown primary. Prior trans urethral resection of bladder tumor 11/19/2019. Biopsy of liver 11/22/2019. EXAM: NUCLEAR MEDICINE PET SKULL BASE TO THIGH TECHNIQUE: 14.4 mCi F-18 FDG was injected intravenously. Full-ring PET imaging was performed from the skull base to thigh after the radiotracer. CT data was obtained and used for attenuation correction and anatomic localization. Fasting blood glucose: 137 mg/dl COMPARISON:  Chest CT 11/22/2019. Abdominopelvic MRI 11/21/2019. Abdominopelvic CT of 11/20/2019. FINDINGS: Mediastinal blood pool activity: SUV max 3.3 Liver activity: SUV max NA NECK: No areas of abnormal hypermetabolism. Incidental CT findings: Cerebral atrophy.  No cervical adenopathy. CHEST: No pulmonary parenchymal or thoracic nodal hypermetabolism. No hypermetabolism to correspond to the previously described macrolobulated anterior mediastinal mass. 4.3 x 3.1 cm and 14.5 HU on 80/3. Incidental CT findings: Deferred to recent diagnostic CT. Cardiomegaly. Aortic and coronary artery atherosclerosis. ABDOMEN/PELVIS: Segment 4 B hypermetabolic liver mass measures 4.9 x 3.9 cm and a S.U.V. max of 22.0 on 145/3. Porta hepatis nodal metastasis at 1.2 cm and a S.U.V. max of 10.2 on 135/3. More equivocal abdominal retroperitoneal nodes, including at 1.1 cm and a S.U.V. max of 3.6 in the pre caval space on 151/3. No pelvic nodal hypermetabolism. Hypermetabolism corresponding to endometrial soft tissue fullness and heterogeneity.  Example at on the order of 4.5 cm and a S.U.V. max of 14.1 on 220/3. The bladder is not well evaluated secondary to physiologic urinary hypermetabolism. There is also urinary contamination about the perineum. Incidental CT findings: Deferred to recent CT and MRI. Abdominal aortic atherosclerosis. Foley catheter within the urinary bladder. Left ureteric stent without significant hydronephrosis. SKELETON: No abnormal marrow activity. Incidental CT findings: none IMPRESSION: 1. Hypermetabolic endometrial mass, consistent with carcinoma. 2. Metastatic disease to the liver  and upper abdominal node or nodes as detailed above. Given the distribution of metastasis, and the extent of endometrial hypermetabolism, endometrial primary is favored. 3. No thoracic hypermetabolic metastasis. The macrolobulated anterior mediastinal mass is not significantly hypermetabolic. Favor pericardial or bronchogenic cyst. Thymoma or less likely thymic carcinoma cannot be entirely excluded but are felt less likely. Given comorbidities, of questionable clinical significance. Consider chest CT follow-up at 3 months. Electronically Signed   By: Abigail Miyamoto M.D.   On: 12/12/2019 14:46   US BIOPSY (LIVER)  Result Date: 11/22/2019 INDICATION: 69 year old female with a history of liver mass, possible gynecologic malignancy [place] EXAM: ULTRASOUND-GUIDED LIVER MASS BIOPSY MEDICATIONS: None. ANESTHESIA/SEDATION: Moderate (conscious) sedation was employed during this procedure. A total of Versed 1.0 mg and Fentanyl 50 mcg was administered intravenously. Moderate Sedation Time: 10 minutes. The patient's level of consciousness and vital signs were monitored continuously by radiology nursing throughout the procedure under my direct supervision. FLUOROSCOPY TIME:  Ultrasound COMPLICATIONS: None PROCEDURE: Informed written consent was obtained from the patient after a thorough discussion of the procedural risks, benefits and alternatives. All questions  were addressed. Maximal Sterile Barrier Technique was utilized including caps, mask, sterile gowns, sterile gloves, sterile drape, hand hygiene and skin antiseptic. A timeout was performed prior to the initiation of the procedure. Ultrasound survey of the liver performed with images stored and sent to PACs. The epigastric region was prepped with chlorhexidine in a sterile fashion, and a sterile drape was applied covering the operative field. A sterile gown and sterile gloves were used for the procedure. Local anesthesia was provided with 1% Lidocaine. The patient was prepped and draped sterilely and the skin and subcutaneous tissues were generously infiltrated with 1% lidocaine. A 17 gauge introducer needle was then advanced under ultrasound guidance in sub costal location into the liver, targeting the hypoechoic mass. The stylet was removed, and multiple separate 18 gauge core biopsy were retrieved. Samples were placed into formalin for transportation to the lab. Gel-Foam pledgets were then infused with a small amount of saline for assistance with hemostasis. The needle was removed, and a final ultrasound image was performed. The patient tolerated the procedure well and remained hemodynamically stable throughout. No complications were encountered and no significant blood loss was encounter. IMPRESSION: Status post ultrasound-guided biopsy of liver mass Signed, Dulcy Fanny. Dellia Nims, RPVI [facility] Radiology Electronically Signed   By: Corrie Mckusick D.O.   On: 11/22/2019 12:25   DG Chest Portable 1 View  Result Date: 11/18/2019 CLINICAL DATA:  Status post fall today. EXAM: PORTABLE CHEST 1 VIEW COMPARISON:  None. FINDINGS: The lungs are clear. Heart size is upper normal. No pneumothorax or pleural effusion. No acute or focal bony abnormality. IMPRESSION: No acute disease. Electronically Signed   By: Inge Rise M.D.   On: 11/18/2019 10:12   DG Knee Complete  4 Views Left  Result Date: 11/18/2019 CLINICAL DATA:  Status post fall. Right knee pain. Initial encounter. EXAM: LEFT KNEE - COMPLETE 4+ VIEW COMPARISON:  None. FINDINGS: There is no acute bony or joint abnormality. Severe tricompartmental osteoarthritis noted. No joint effusion. IMPRESSION: No acute finding. Severe tricompartmental osteoarthritis. Electronically Signed   By: Inge Rise M.D.   On: 11/18/2019 10:15   DG OR UROLOGY CYSTO IMAGE ([facility] ONLY)  Result Date: 11/20/2019 There is no interpretation for this exam.  This order is for images obtained during a surgical procedure.  Please See "Surgeries" Tab for more information regarding the  procedure.   ECHOCARDIOGRAM COMPLETE  Result Date: 11/19/2019    ECHOCARDIOGRAM REPORT   Patient Name:   [facility] Date of Exam: 11/19/2019 Medical Rec #:  [id number]        Height:       65.0 in Accession #:    [id number]       Weight:       292.8 lb Date of Birth:  06-15-1950        BSA:          2.326 m Patient Age:    69 years         BP:           110/72 mmHg Patient Gender: F                HR:           89 bpm. Exam Location:  [facility] Procedure: 2D Echo, Cardiac Doppler and Color Doppler Indications:     Abnormal ECG 794.31  History:         Patient has prior history of Echocardiogram examinations, most                  recent 03/23/2016. Stroke; Risk Factors:Hypertension and                  Diabetes.  Sonographer:     Sherrie Sport RDCS (AE) Referring Phys:  [id number] [facility] AMIN Diagnosing Phys: Harrell Gave End MD  Sonographer Comments: Technically challenging study due to limited acoustic windows, no apical window and no subcostal window. IMPRESSIONS  1. Left ventricular ejection fraction, by estimation, is >55%. The left ventricle has normal function. Left ventricular endocardial border not optimally defined to evaluate regional wall motion. There is severe left ventricular hypertrophy. Left ventricular diastolic function could not be evaluated.  2.  Right ventricular systolic function is normal. The right ventricular size is not well visualized. Tricuspid regurgitation signal is inadequate for assessing PA pressure.  3. The mitral valve is grossly normal. Trivial mitral valve regurgitation.  4. The aortic valve is grossly normal. Aortic valve regurgitation not well assessed. FINDINGS  Left Ventricle: Left ventricular ejection fraction, by estimation, is >55%. The left ventricle has normal function. Left ventricular endocardial border not optimally defined to evaluate regional wall motion. The left ventricular internal cavity size was  normal in size. There is severe left ventricular hypertrophy. Left ventricular diastolic function could not be evaluated. Right Ventricle: The right ventricular size is not well visualized. No increase in right ventricular wall thickness. Right ventricular systolic function is normal. Tricuspid regurgitation signal is inadequate for assessing PA pressure. Left Atrium: Left atrial size was not well visualized. Right Atrium: Right atrial size was not well visualized. Pericardium: The pericardium was not well visualized. Mitral Valve: The mitral valve is grossly normal. Mild mitral annular calcification. Trivial mitral valve regurgitation. Tricuspid Valve: The tricuspid valve is grossly normal. Tricuspid valve regurgitation is trivial. Aortic Valve: The aortic valve is grossly normal. Aortic valve regurgitation not well assessed. Pulmonic Valve: The pulmonic valve was not well visualized. Pulmonic valve regurgitation is not visualized. No evidence of pulmonic stenosis. Aorta: The aortic root is normal in size and structure. Pulmonary Artery: The pulmonary artery is of normal size. IAS/Shunts: The interatrial septum was not well visualized.  LEFT VENTRICLE PLAX 2D LVIDd:         4.60 cm LVIDs:         2.97 cm LV PW:  1.89 cm LV IVS:        2.25 cm LVOT diam:     2.00 cm LVOT Area:     3.14 cm  LEFT ATRIUM         Index LA  diam:    5.70 cm 2.45 cm/m                        PULMONIC VALVE AORTA                 PV Vmax:        0.72 m/s Ao Root diam: 3.50 cm PV Peak grad:   2.1 mmHg                       RVOT Peak grad: 2 mmHg   SHUNTS Systemic Diam: 2.00 cm Nelva Bush MD Electronically signed by Nelva Bush MD Signature Date/Time: 11/19/2019/6:33:33 PM    Final    CT HEMATURIA WORKUP  Addendum Date: 11/20/2019   ADDENDUM REPORT: 11/20/2019 12:05 ADDENDUM: The submitted images are without intravenous contrast. In addition to the above findings, there is an oval, heterogeneous, faintly visualized mass in the inferior aspect of the medial segment of the left lobe of the liver, adjacent to the gallbladder fossa, measuring 4.3 x 3.6 cm in maximum dimensions on image number 31 series 4. This is concerning for a liver metastasis. These results will be called to the ordering clinician or representative by the Radiologist Assistant, and communication documented in the PACS or [employer]. Electronically Signed   By: Claudie Revering M.D.   On: 11/20/2019 12:05   Result Date: 11/20/2019 CLINICAL DATA:  Hematuria.  Weakness. EXAM: CT ABDOMEN AND PELVIS WITHOUT AND WITH CONTRAST TECHNIQUE: Multidetector CT imaging of the abdomen and pelvis was performed following the standard protocol before and following the bolus administration of intravenous contrast. CONTRAST:  157mL OMNIPAQUE IOHEXOL 300 MG/ML  SOLN COMPARISON:  Pelvic ultrasound obtained yesterday. FINDINGS: Lower chest: Enlarged heart. Minimal pericardial effusion with a maximum thickness of 11 mm. Minimal bibasilar atelectasis or scarring. Hepatobiliary: 2 tiny gallstones in the gallbladder and ill-defined soft tissue density in the dependent portion of the gallbladder. No gallbladder wall thickening or pericholecystic fluid. Unremarkable liver. Pancreas: Unremarkable. No pancreatic ductal dilatation or surrounding inflammatory changes. Spleen: Normal in size without focal  abnormality. Adrenals/Urinary Tract: Rounded fat density left adrenal mass measuring 2.0 cm on image number 23 series 2. Normal appearing right adrenal gland. Mild-to-moderate dilatation of the left renal collecting system and ureter to the level of the ureterovesical junction. At that location, there is an irregular, rounded mass in the bladder measuring 3.7 x 3.7 cm on image number 78 series 2 with a small anterior calcification. This mass measures 4.0 cm in length on sagittal image number 75. To the right of midline in the dependent portion of the bladder, there is a 2nd mass measures 3.1 x 1.8 x 1.7 cm on sagittal image number 67 and axial image number 78 series 2. 3 mm right renal calculus and 1.4 cm rounded, medium density mass arising from the lateral aspect of the midportion of the right kidney, measured on image number 28 series 2. Normal appearing right ureter. Stomach/Bowel: Stomach is within normal limits. Appendix appears normal. No evidence of bowel wall thickening, distention, or inflammatory changes. Vascular/Lymphatic: Atheromatous arterial calcifications without aneurysm. No enlarged lymph nodes. Reproductive: Heterogeneous endometrial mass measuring 4.5 x 4.0 cm on image number  60 series 2 and 6.3 cm in length on sagittal image number 80. No adnexal mass seen. Other: Small amount of subcutaneous air anteriorly on the right, compatible with recent subcutaneous injection. There is also bilateral subcutaneous edema at the level of the lower abdomen, greater on the left. Musculoskeletal: Lumbar and lower thoracic spine degenerative changes. IMPRESSION: 1. 4.0 x 3.7 x 3.7 cm irregular, rounded mass in the bladder on the left, compatible with a primary bladder carcinoma. This is obstructing the distal left ureter causing mild to moderate left hydronephrosis and hydroureter. 2. 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 3. 3 mm  nonobstructing right renal calculus and 1.4 cm right renal mass or proteinaceous cyst. 4. 6.3 x 4.5 x 4.0 cm heterogeneous endometrial mass. 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 5. Calcified cholelithiasis and possible noncalcified cholelithiasis or sludge in the gallbladder 6. Minimal pericardial effusion. 7. Cardiomegaly. 8. 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 Electronically Signed: By: Claudie Revering M.D. On: 11/19/2019 14:21     Assessment and plan- Patient is a 69 y.o. female who presents to [facility] for initial meeting in preparation for starting chemotherapy for the treatment of stage IV unknown malignancy.   1. HPI: Brittney Shepard is a 69 year old female with past medical history for CHF, morbid obesity, A. fib history of stroke and COPD who was recently hospitalized due to a mechanical fall.  Work-up included imaging showing a mass in her bladder.  She is status post TURBT.  Discovered a uterine mass, liver mass and mediastinal mass on CT of her chest.  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  She is not a surgical candidate.  Plan is to begin neoadjuvant chemotherapy.  Having PET done tomorrow.  2. Chemo Care Clinic/High Risk for ER/Hospitalization during chemotherapy- We discussed the role of the chemo care clinic and identified patient specific risk factors. I discus plan is to begin sed that patient was identified as high risk primarily based on: Stage of disease and comorbidities.  Patient has past medical history positive for: Past Medical History:  Diagnosis Date  . A-fib ([place])   . Bladder cancer ([place])   . CHF (congestive heart failure) ([place])   . Chronic pain   . Cognitive communication deficit   . Diabetes mellitus without complication ([place])   . Hypertension   . Hypokalemia   . Muscle weakness (generalized)   . 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 Secondary malignant neoplasm of liver ([place])   . Stroke [facility])  2013  . Urinary tract infection, site not specified     Patient has past surgical history positive for: Past Surgical History:  Procedure Laterality Date  . CYSTOSCOPY W/ RETROGRADES N/A 11/20/2019   Procedure: CYSTOSCOPY WITH RETROGRADE PYELOGRAM;  Surgeon: Billey Co, MD;  Location: [facility] ORS;  Service: Urology;  Laterality: N/A;  . CYSTOSCOPY WITH STENT PLACEMENT Left 11/20/2019   Procedure: CYSTOSCOPY WITH STENT PLACEMENT;  Surgeon: Billey Co, MD;  Location: [facility] ORS;  Service: Urology;  Laterality: Left;  . TRANSURETHRAL RESECTION OF BLADDER TUMOR N/A 11/20/2019   Procedure: TRANSURETHRAL RESECTION OF BLADDER TUMOR (TURBT);  Surgeon: Billey Co, MD;  Location: [facility] ORS;  Service: Urology;  Laterality: N/A;    Based on our high risk symptom management report; this patient has a high risk of ED utilization.  The percentage below indicates how "at  risk "  this patient based on the factors in this table within one year.   General Risk Score: 8  Values used to calculate this score:   Points  Metrics      1        Age: 42      2        Hospital Admissions: 2      1        ED Visits: 1      1        Has Chronic Obstructive Pulmonary Disease: Yes      1        Has Diabetes: Yes      1        Has Congestive Heart Failure: Yes      1        Has liver disease: Yes      0        Has Depression: No      0        Current PCP: Marguerita Merles, MD      0        Has Medicaid: No    3. We discussed that social determinants of health may have significant impacts on health and outcomes for cancer patients.  Today we discussed specific social determinants of performance status, alcohol use, depression, financial needs, food insecurity, housing, interpersonal violence, social connections, stress, tobacco use, and transportation.    After lengthy discussion the following were identified as areas of need: None at this time.   Outpatient services: We discussed options including home  based and outpatient services, DME and care program. We discusssed that patients who participate in regular physical activity report fewer negative impacts of cancer and treatments and report less fatigue.   Financial Concerns: We discussed that living with cancer can create tremendous financial burden.  We discussed options for assistance. I asked that if assistance is needed in affording medications or paying bills to please let us know so that we can provide assistance. We discussed options for food including social services, [employer] ($50 every 2 weeks) and onsite food pantry.  We will also notify [facility]ab[facility] Lister [facility] to see if cancer center can provide additional support.  Referral to Social work: Introduced Education officer, museum Elease Etienne and the services he can provide such as support with [employer], cell phone and gas vouchers.   Support groups: We discussed options for support groups at the cancer center. If interested, please notify nurse navigator to enroll. We discussed options for managing stress including healthy eating, exercise as well as participating in no charge counseling services at the cancer center and support groups.  If these are of interest, patient can notify either myself or primary nursing team.We discussed options for management including medications and referral to quit Smart program  Transportation: We discussed options for transportation including acta, paratransit, bus routes, link transit, taxi/uber/lyft, and cancer center [place].  I have notified primary oncology team who will help assist with arranging Lucianne Lei transportation for appointments when/if needed. We also discussed options for transportation on short notice/acute visits.  Palliative care services: We have palliative care services available in the cancer center to discuss goals of care and advanced care planning.  Please let us know if you have any questions or would like to speak to our palliative nurse  practitioner.  Symptom Management Clinic: We discussed our symptom management clinic which is available for acute concerns while receiving  treatment such as nausea, vomiting or diarrhea.  We can be reached via telephone at [phone] or through my chart.  We are available for virtual or in person visits on the same day from 830 to 4 PM Monday through Friday. She denies needing specific assistance at this time and She will be followed by Dr. Janese Banks clinical team.  Plan: Discussed symptom management clinic. Discussed palliative care services. Discussed resources that are available here at the cancer center. Discussed medications and new prescriptions to begin treatment such as anti-nausea or steroids.   Disposition: RTC tomorrow for Pet Scan.  RTC on 12/14/19 for labs, md assess and chemo.   Visit Diagnosis 1. Cancer with unknown primary site [facility])   2. Liver metastases ([place])     Patient expressed understanding and was in agreement with this plan. She also understands that She can call clinic at any time with any questions, concerns, or complaints.   Greater than 50% was spent in counseling and coordination of care with this patient including but not limited to discussion of the relevant topics above (See A&P) including, but not limited to diagnosis and management of acute and chronic medical conditions.   [place] [facility] [place]  CC: Dr. Janese Banks

## 2019-12-12 ENCOUNTER — Other Ambulatory Visit: Payer: Self-pay

## 2019-12-12 ENCOUNTER — Ambulatory Visit
Admission: RE | Admit: 2019-12-12 | Discharge: 2019-12-12 | Disposition: A | Discharge status: Home / Self Care | Payer: No Typology Code available for payment source | Source: Ambulatory Visit | Attending: Internal Medicine | Admitting: Internal Medicine

## 2019-12-12 DIAGNOSIS — C787 Secondary malignant neoplasm of liver and intrahepatic bile duct: Secondary | ICD-10-CM | POA: Diagnosis present

## 2019-12-12 DIAGNOSIS — Z906 Acquired absence of other parts of urinary tract: Secondary | ICD-10-CM | POA: Diagnosis not present

## 2019-12-12 DIAGNOSIS — C772 Secondary and unspecified malignant neoplasm of intra-abdominal lymph nodes: Secondary | ICD-10-CM | POA: Insufficient documentation

## 2019-12-12 DIAGNOSIS — R222 Localized swelling, mass and lump, trunk: Secondary | ICD-10-CM | POA: Diagnosis not present

## 2019-12-12 DIAGNOSIS — C801 Malignant (primary) neoplasm, unspecified: Secondary | ICD-10-CM

## 2019-12-12 DIAGNOSIS — C541 Malignant neoplasm of endometrium: Secondary | ICD-10-CM | POA: Diagnosis not present

## 2019-12-12 LAB — GLUCOSE, CAPILLARY: Glucose-Capillary: 137 mg/dL — ABNORMAL HIGH (ref 70–99)

## 2019-12-12 MED ORDER — FLUDEOXYGLUCOSE F - 18 (FDG) INJECTION
14.1000 | Freq: Once | INTRAVENOUS | Status: AC | PRN
  Administered 2019-12-12: 14.4 via INTRAVENOUS

## 2019-12-13 ENCOUNTER — Other Ambulatory Visit: Payer: Self-pay | Admitting: Internal Medicine

## 2019-12-13 ENCOUNTER — Telehealth: Payer: Self-pay | Admitting: Internal Medicine

## 2019-12-13 ENCOUNTER — Inpatient Hospital Stay: Payer: Medicare Other

## 2019-12-13 NOTE — Telephone Encounter (Signed)
Spoke to Dr.Secord-PET scan concerning for endometrial primary; although pathology contrary.  Recommends evaluation with gynecology oncology; next week.  kristie-please set up appointment.  Patient will follow up with Dr. Janese Banks as planned.

## 2019-12-14 ENCOUNTER — Encounter: Payer: Self-pay | Admitting: Oncology

## 2019-12-14 ENCOUNTER — Encounter: Payer: Self-pay | Admitting: Internal Medicine

## 2019-12-14 ENCOUNTER — Other Ambulatory Visit: Payer: Self-pay

## 2019-12-14 ENCOUNTER — Inpatient Hospital Stay (HOSPITAL_BASED_OUTPATIENT_CLINIC_OR_DEPARTMENT_OTHER): Payer: Medicare Other | Admitting: Oncology

## 2019-12-14 ENCOUNTER — Inpatient Hospital Stay (HOSPITAL_BASED_OUTPATIENT_CLINIC_OR_DEPARTMENT_OTHER): Payer: Medicare Other | Admitting: Hospice and Palliative Medicine

## 2019-12-14 VITALS — BP 103/67 | HR 80 | Temp 97.3°F | Resp 18 | Wt 270.0 lb

## 2019-12-14 DIAGNOSIS — Z87440 Personal history of urinary (tract) infections: Secondary | ICD-10-CM | POA: Diagnosis not present

## 2019-12-14 DIAGNOSIS — C801 Malignant (primary) neoplasm, unspecified: Secondary | ICD-10-CM | POA: Diagnosis not present

## 2019-12-14 DIAGNOSIS — Z515 Encounter for palliative care: Secondary | ICD-10-CM

## 2019-12-14 DIAGNOSIS — Z79899 Other long term (current) drug therapy: Secondary | ICD-10-CM | POA: Diagnosis not present

## 2019-12-14 DIAGNOSIS — N9489 Other specified conditions associated with female genital organs and menstrual cycle: Secondary | ICD-10-CM | POA: Diagnosis not present

## 2019-12-14 DIAGNOSIS — Z7189 Other specified counseling: Secondary | ICD-10-CM | POA: Diagnosis not present

## 2019-12-14 DIAGNOSIS — C541 Malignant neoplasm of endometrium: Secondary | ICD-10-CM | POA: Diagnosis not present

## 2019-12-14 DIAGNOSIS — Z8041 Family history of malignant neoplasm of ovary: Secondary | ICD-10-CM | POA: Diagnosis not present

## 2019-12-14 DIAGNOSIS — Z809 Family history of malignant neoplasm, unspecified: Secondary | ICD-10-CM | POA: Diagnosis not present

## 2019-12-14 DIAGNOSIS — E119 Type 2 diabetes mellitus without complications: Secondary | ICD-10-CM | POA: Diagnosis not present

## 2019-12-14 DIAGNOSIS — Z8673 Personal history of transient ischemic attack (TIA), and cerebral infarction without residual deficits: Secondary | ICD-10-CM | POA: Diagnosis not present

## 2019-12-14 DIAGNOSIS — E279 Disorder of adrenal gland, unspecified: Secondary | ICD-10-CM | POA: Diagnosis not present

## 2019-12-14 DIAGNOSIS — Z7982 Long term (current) use of aspirin: Secondary | ICD-10-CM | POA: Diagnosis not present

## 2019-12-14 DIAGNOSIS — C787 Secondary malignant neoplasm of liver and intrahepatic bile duct: Secondary | ICD-10-CM

## 2019-12-14 DIAGNOSIS — C671 Malignant neoplasm of dome of bladder: Secondary | ICD-10-CM

## 2019-12-14 DIAGNOSIS — I5032 Chronic diastolic (congestive) heart failure: Secondary | ICD-10-CM | POA: Diagnosis not present

## 2019-12-14 DIAGNOSIS — R1900 Intra-abdominal and pelvic swelling, mass and lump, unspecified site: Secondary | ICD-10-CM | POA: Diagnosis not present

## 2019-12-14 DIAGNOSIS — Z7984 Long term (current) use of oral hypoglycemic drugs: Secondary | ICD-10-CM | POA: Diagnosis not present

## 2019-12-14 DIAGNOSIS — I110 Hypertensive heart disease with heart failure: Secondary | ICD-10-CM | POA: Diagnosis not present

## 2019-12-14 DIAGNOSIS — Z8551 Personal history of malignant neoplasm of bladder: Secondary | ICD-10-CM | POA: Diagnosis not present

## 2019-12-14 DIAGNOSIS — I4891 Unspecified atrial fibrillation: Secondary | ICD-10-CM | POA: Diagnosis not present

## 2019-12-14 NOTE — Progress Notes (Signed)
[place]  [phone] [phone]   Name: Brittney Shepard Date: 12/14/2019 MRN: [id number]  DOB: 03-Oct-1950  Patient Care Team: Marguerita Merles, MD as PCP - General (Family Medicine) Kate Sable, MD as PCP - Cardiology (Cardiology) Clent Jacks, RN as Oncology Nurse Navigator    REASON FOR CONSULTATION: Brittney Shepard is a 69 y.o. female with multiple medical problems including history of CHF, COPD, A. fib not on anticoagulation, history of CVA, who was incidentally found to have a large bladder mass on imaging following a fall.  Patient subsequently underwent TURBT on 11/20/2019 with ureteral stent placement by Dr. Diamantina Providence.  Pathology revealed a high-grade urothelial carcinoma.  Patient also underwent biopsy of a liver mass on 11/22/2019 with findings consistent for poorly differentiated carcinoma.  Patient is not felt to be a surgical candidate for her stage IV cancer.  She is pending initiation of palliative chemotherapy.  Palliative care was consulted help address goals.  SOCIAL HISTORY:     reports that she has never smoked. She has never used smokeless tobacco. She reports that she does not drink alcohol and does not use drugs.   Patient is widowed for the past several years.  She has a son who lives about 30 miles away.  Patient lives at home alone.  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  ADVANCE DIRECTIVES:  Does not have  CODE STATUS:   PAST MEDICAL HISTORY: Past Medical History:  Diagnosis Date  . A-fib ([place])   . Bladder cancer ([place])   . CHF (congestive heart failure) ([place])   . Chronic pain   . Cognitive communication deficit   . Diabetes mellitus without complication ([place])   . Hypertension   . Hypokalemia   . Muscle weakness (generalized)   . 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 Secondary malignant neoplasm of liver ([place])   . Stroke [facility]) 2013    . Urinary tract infection, site not specified     PAST SURGICAL HISTORY:  Past Surgical History:  Procedure Laterality Date  . CYSTOSCOPY W/ RETROGRADES N/A 11/20/2019   Procedure: CYSTOSCOPY WITH RETROGRADE PYELOGRAM;  Surgeon: Billey Co, MD;  Location: [facility] ORS;  Service: Urology;  Laterality: N/A;  . CYSTOSCOPY WITH STENT PLACEMENT Left 11/20/2019   Procedure: CYSTOSCOPY WITH STENT PLACEMENT;  Surgeon: Billey Co, MD;  Location: [facility] ORS;  Service: Urology;  Laterality: Left;  . TRANSURETHRAL RESECTION OF BLADDER TUMOR N/A 11/20/2019   Procedure: TRANSURETHRAL RESECTION OF BLADDER TUMOR (TURBT);  Surgeon: Billey Co, MD;  Location: [facility] ORS;  Service: Urology;  Laterality: N/A;    HEMATOLOGY/ONCOLOGY HISTORY:  Oncology History Overview Note  # 2021- Liver bx-poorly differentiated CK 7+ carcinoma- DIAGNOSIS:  A. LIVER; ULTRASOUND-GUIDED CORE NEEDLE BIOPSY:  - POSITIVE FOR MALIGNANCY.  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   Comment:  Immunohistochemical studies show tumor cells to be positive for CK7 and  CK19, with focal faint expression of CD20. Tumor cells are negative for  CDX-2, GATA-3, TTF-1, and Pax-8. Additional stains were attempted, but  malignant tissue is exhausted in the core utilized for these studies.   The immunohistochemical pattern is relatively non-specific. The  patient's recent diagnosis of invasive urothelial carcinoma is noted,  but the histologic pattern does not appear similar, and lack of staining  for GATA3 would suggest against this. Concern for a gynecologic  malignancy is also noted, but lack  of marking for Pax-8 would suggest  against this. CK7 positivity may be seen in carcinomas arising in the  upper GI tract, lung, pancreatobiliary tree, and thymus, among other  sites. Lack of marking for TTF-1 would suggest against lung. The  presence of CK19 staining, while not specific, may be seen in carcinomas  arising from the  pancreatobiliary tree, as well as urinary bladder,  endometrium, lung, and kidney. Recent laboratory testing indicates liver  function testing within normal limits. Clinical and radiographic  correlation is recommended.   # JULY 2021- cystocopy- DIAGNOSIS: A. 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  - NO MUSCULARIS PROPRIA IDENTIFIED.   B. 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  - MUSCULARIS PROPRIA PRESENT AND UNINVOLVED.   # ENDOMETRIAL MASS [Dr.Staebler]- unable to Bx- sec to cervical stenosis  # Mediastinal mass  Lobulated anterior mediastinal 4.1 x 3.6 cm soft tissue mass, indeterminate for nodal metastasis versus primary thymic neoplasm. No additional potential findings of metastatic disease in the chest. 3. Mild cardiomegaly. Three-vessel coronary atherosclerosis. 4. Left adrenal myelolipoma. 5. Exophytic isodense 1.3 cm renal cortical lesion in the lateral upper right kidney  # CHF-compensated; morbid obesity; diabetes; COPD [not on O2]; A. Fib- not on anticoagulation.    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[place])  11/22/2019 Initial Diagnosis   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[place])   12/04/2019 -  Chemotherapy   The patient had dexamethasone (DECADRON) 4 MG tablet, 8 mg, Oral, Daily, 0 of 1 cycle, Start date: --, End date: -- palonosetron (ALOXI) injection 0.25 mg, 0.25 mg, Intravenous,  Once, 0 of 6 cycles pegfilgrastim (NEULASTA ONPRO KIT) injection 6 mg, 6 mg, Subcutaneous, Once, 0 of 6 cycles CARBOplatin (PARAPLATIN) in sodium chloride 0.9 % 100 mL chemo infusion, , Intravenous,  Once, 0 of 6 cycles fosaprepitant (EMEND) 150 mg in sodium chloride 0.9 % 145 mL IVPB, 150 mg, Intravenous,  Once, 0 of 6 cycles PACLitaxel (TAXOL) 558 mg in sodium chloride  0.9 % 500 mL chemo infusion (> 73m/m2), 225 mg/m2, Intravenous,  Once, 0 of 6 cycles  for chemotherapy treatment.    Liver metastases ([place]   Initial Diagnosis   Liver metastases ([place]   12/04/2019 -  Chemotherapy   The patient had dexamethasone (DECADRON) 4 MG tablet, 8 mg, Oral, Daily, 0 of 1 cycle, Start date: --, End date: -- palonosetron (ALOXI) injection 0.25 mg, 0.25 mg, Intravenous,  Once, 0 of 6 cycles pegfilgrastim (NEULASTA ONPRO KIT) injection 6 mg, 6 mg, Subcutaneous, Once, 0 of 6 cycles CARBOplatin (PARAPLATIN) in sodium chloride 0.9 % 100 mL chemo infusion, , Intravenous,  Once, 0 of 6 cycles fosaprepitant (EMEND) 150 mg in sodium chloride 0.9 % 145 mL IVPB, 150 mg, Intravenous,  Once, 0 of 6 cycles PACLitaxel (TAXOL) 558 mg in sodium chloride 0.9 % 500 mL chemo infusion (> 855mm2), 225 mg/m2, Intravenous,  Once, 0 of 6 cycles  for chemotherapy treatment.      ALLERGIES:  is allergic to penicillin g and sulfa antibiotics.  MEDICATIONS:  Current Outpatient Medications  Medication Sig Dispense Refill  . acetaminophen (TYLENOL) 325 MG tablet Take 2 tablets (650 mg total) by mouth every 6 (six) hours as needed for mild pain (or Fever >/= 101). 60 tablet 0  . albuterol (VENTOLIN HFA) 108 (90 Base) MCG/ACT inhaler Inhale 2 puffs into the lungs every 6 (six)  hours as needed for wheezing or shortness of breath.    Marland Kitchen aspirin EC 81 MG tablet Take 1 tablet (81 mg total) by mouth daily. Swallow whole. 90 tablet 3  . atenolol (TENORMIN) 50 MG tablet Take 0.5 tablets (25 mg total) by mouth daily. 15 tablet 0  . barrier cream (NON-SPECIFIED) CREA Apply 1 application topically 2 (two) times daily as needed.    . diltiazem (CARDIZEM SR) 60 MG 12 hr capsule Take 1 capsule (60 mg total) by mouth every 12 (twelve) hours. 60 capsule 0  . furosemide (LASIX) 40 MG tablet Take 1 tablet (40 mg total) by mouth 2 (two) times daily. 30 tablet 0  . ipratropium-albuterol (DUONEB) 0.5-2.5 (3) MG/3ML  SOLN Take 3 mLs by nebulization 3 (three) times daily. 360 mL 0  . metFORMIN (GLUCOPHAGE) 500 MG tablet Take 500 mg by mouth 2 (two) times daily with a meal.     . Multiple Vitamin (MULTIVITAMIN) tablet Take 1 tablet by mouth daily.    Marland Kitchen nystatin (NYSTATIN) powder Apply 1 application topically 2 (two) times daily.    . polyethylene glycol (MIRALAX / GLYCOLAX) 17 g packet Take 17 g by mouth daily as needed for mild constipation. 14 each 0  . potassium chloride (KLOR-CON) 10 MEQ tablet Take 10 mEq by mouth 2 (two) times daily.    Marland Kitchen senna-docusate (SENOKOT-S) 8.6-50 MG tablet Take 1 tablet by mouth 2 (two) times daily. 60 tablet 0   No current facility-administered medications for this visit.    VITAL SIGNS: There were no vitals taken for this visit. There were no vitals filed for this visit.  Estimated body mass index is 48.59 kg/m as calculated from the following:   Height as of 12/08/19: _0  (1.651 m).   Weight as of 12/04/19: 292 lb (132.5 kg).  LABS: CBC:    Component Value Date/Time   WBC 9.1 12/04/2019 1158   HGB 12.6 12/04/2019 1158   HCT 39.7 12/04/2019 1158   PLT 286 12/04/2019 1158   MCV 85.4 12/04/2019 1158   NEUTROABS 6.9 12/04/2019 1158   LYMPHSABS 1.2 12/04/2019 1158   MONOABS 0.5 12/04/2019 1158   EOSABS 0.4 12/04/2019 1158   BASOSABS 0.1 12/04/2019 1158   Comprehensive Metabolic Panel:    Component Value Date/Time   NA 134 (L) 12/04/2019 1158   K 3.6 12/04/2019 1158   CL 94 (L) 12/04/2019 1158   CO2 28 12/04/2019 1158   BUN 23 12/04/2019 1158   CREATININE 0.97 12/04/2019 1158   GLUCOSE 175 (H) 12/04/2019 1158   CALCIUM 8.0 (L) 12/04/2019 1158   AST 24 12/04/2019 1158   ALT 16 12/04/2019 1158   ALKPHOS 87 12/04/2019 1158   BILITOT 1.0 12/04/2019 1158   PROT 7.4 12/04/2019 1158   ALBUMIN 3.5 12/04/2019 1158    RADIOGRAPHIC STUDIES: DG Tibia/Fibula Right  Result Date: 11/18/2019 CLINICAL DATA:  Status post fall. Right lower leg pain. Initial encounter.  EXAM: RIGHT TIBIA AND FIBULA - 2 VIEW COMPARISON:  None. FINDINGS: No acute bony or joint abnormality. Severe knee and talonavicular osteoarthritis noted. Soft tissues are unremarkable. IMPRESSION: No acute abnormality. Electronically Signed   By: Inge Rise M.D.   On: 11/18/2019 10:16   CT CHEST WO CONTRAST  Result Date: 11/21/2019 CLINICAL DATA:  Inpatient. New diagnosis of bladder cancer. Endometrial mass. Liver mass. Chest staging. EXAM: CT CHEST WITHOUT CONTRAST TECHNIQUE: Multidetector CT imaging of the chest was performed following the standard protocol without IV contrast. COMPARISON:  11/29/2019 MRI abdomen/pelvis. 11/19/2019 CT abdomen/pelvis. 11/18/2019 chest radiograph. FINDINGS: Motion degraded scan, particularly at the lung bases, limiting assessment. Cardiovascular: Mild cardiomegaly. No significant pericardial effusion/thickening. Three-vessel coronary atherosclerosis. Atherosclerotic nonaneurysmal thoracic aorta. Top-normal caliber main pulmonary artery (3 point cm diameter). Mediastinum/Nodes: No discrete thyroid nodules. Unremarkable esophagus. No axillary adenopathy. Lobulated anterior mediastinal 4.1 x 3.6 cm soft tissue mass (series 2/image 46). Otherwise no pathologically enlarged mediastinal or discrete hilar nodes on these noncontrast images. Lungs/Pleura: No pneumothorax. No pleural effusion. No acute consolidative airspace disease, lung masses or significant pulmonary nodules. 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 Exophytic isodense 1.3 cm renal cortical lesion in the lateral upper right kidney (series 2/image 145), not well characterized on MRI from earlier today due to motion degradation. Musculoskeletal: No aggressive appearing focal osseous lesions. Moderate thoracic spondylosis. IMPRESSION: 1. Limited motion degraded scan. 2. 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 No  additional potential findings of metastatic disease in the chest. 3. Mild cardiomegaly. Three-vessel coronary atherosclerosis. 4. Left adrenal myelolipoma. 5. Exophytic isodense 1.3 cm renal cortical lesion in the lateral upper right kidney, not well characterized on MRI from earlier today due to motion degradation. Suggest attention on follow-up CT or MRI abdomen without and with IV contrast in 6-12 months. 6. Aortic Atherosclerosis (ICD10-I70.0). Electronically Signed   By: Ilona Sorrel M.D.   On: 11/21/2019 16:24   MR PELVIS W WO CONTRAST  Result Date: 11/21/2019 CLINICAL DATA:  Bladder and endometrial masses., concern for hepatic metastatic disease EXAM: MRI ABDOMEN AND PELVIS WITHOUT AND WITH CONTRAST TECHNIQUE: Multiplanar multisequence MR imaging of the abdomen and pelvis was performed both before and after the administration of intravenous contrast. CONTRAST:  64m GADAVIST GADOBUTROL 1 MMOL/ML IV SOLN COMPARISON:  CT evaluation of November 19, 2019 FINDINGS: COMBINED FINDINGS FOR BOTH MR ABDOMEN AND PELVIS Lower chest: No effusion or consolidation. Cardiomegaly. Heart is incompletely imaged. Hepatobiliary: Mass in hepatic subsegment IV B along the gallbladder fossa showing target like appearance measuring approximately 3.9 x 3.1 cm. (Image 48, series 28) mass abuts the gallbladder. Trace pericholecystic fluid. No significant wall enhancement or pericholecystic stranding. No biliary duct dilation. Small satellite lesions seen just inferior to the dominant mass measuring approximately 1 cm (image 57, series 28) Pancreas: Multi cystic changes throughout the pancreas (image 25, series 1812 mm cystic lesion in the head of the pancreas. Between 10 and 15 additional small cystic foci about the pancreas with pancreatic atrophy and mild main duct distension. An example of another lesion is a 13 mm lesion in the uncinate (image 28, series 18) areas without overtly suspicious features otherwise. Spleen:  Spleen normal  in size and contour. 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 No sign of hydronephrosis. Small RIGHT renal cyst. Stomach/Bowel: Gastrointestinal tract is normal to the extent evaluated. Study not performed for bowel evaluation. Vascular/Lymphatic: Patent abdominal vasculature with atherosclerotic plaque in the nonaneurysmal abdominal aorta. Retroaortic LEFT renal vein. Celiac nodal enlargement and portacaval nodal enlargement (image 48, series 27) 12 mm portacaval lymph node adjacent to pancreatic head. (Image 90, series 21) 16 mm celiac lymph node.  IVC is patent. No pelvic adenopathy. Pelvic vasculature not well assessed but grossly patent. Reproductive: Thickening and irregularity of the endometrium measuring approximately 4 cm greatest thickness with cystic changes. Myometrial invasion best seen on sagittal image 23, series 35, there is motion on the current exam which limits assessment but suggestion of invasion  at least to 50% of the myometrium, oblique post-contrast images raise this question as well but obliquity challenges the ability to determine depth of myometrial invasion Urinary bladder is decompressed, large tumor removed by trans urethral resection. Assessment of urinary bladder is limited by under distension. Other:  No ascites. Musculoskeletal: Vertebral hemangiomata and endplate degenerative changes. IMPRESSION: 1. Hepatic mass suspicious for metastatic disease. Given other findings outlined below biopsy may be helpful to determine source of metastasis. 2. Endometrial mass with invasive features. Study limited by motion on the current evaluation but imaging suggests myometrial invasion to at least 50% along the anterior uterus. Given above findings depending on results of additional workup; if depth of myometrial invasion will change patient management a repeat evaluation may be helpful in the outpatient setting. 3. Celiac and portacaval nodal enlargement, suspicious for  nodal metastases. 4. Assessment of the urinary bladder is limited by under distension large tumor has been resected. Electronically Signed   By: Zetta Bills M.D.   On: 11/21/2019 08:38   MR ABDOMEN W WO CONTRAST  Result Date: 11/21/2019 CLINICAL DATA:  Bladder and endometrial masses., concern for hepatic metastatic disease EXAM: MRI ABDOMEN AND PELVIS WITHOUT AND WITH CONTRAST TECHNIQUE: Multiplanar multisequence MR imaging of the abdomen and pelvis was performed both before and after the administration of intravenous contrast. CONTRAST:  37m GADAVIST GADOBUTROL 1 MMOL/ML IV SOLN COMPARISON:  CT evaluation of November 19, 2019 FINDINGS: COMBINED FINDINGS FOR BOTH MR ABDOMEN AND PELVIS Lower chest: No effusion or consolidation. Cardiomegaly. Heart is incompletely imaged. Hepatobiliary: Mass in hepatic subsegment IV B along the gallbladder fossa showing target like appearance measuring approximately 3.9 x 3.1 cm. (Image 48, series 28) mass abuts the gallbladder. Trace pericholecystic fluid. No significant wall enhancement or pericholecystic stranding. No biliary duct dilation. Small satellite lesions seen just inferior to the dominant mass measuring approximately 1 cm (image 57, series 28) Pancreas: Multi cystic changes throughout the pancreas (image 25, series 1812 mm cystic lesion in the head of the pancreas. Between 10 and 15 additional small cystic foci about the pancreas with pancreatic atrophy and mild main duct distension. An example of another lesion is a 13 mm lesion in the uncinate (image 28, series 18) areas without overtly suspicious features otherwise. Spleen:  Spleen normal in size and contour. 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 No sign of hydronephrosis. Small RIGHT renal cyst. Stomach/Bowel: Gastrointestinal tract is normal to the extent evaluated. Study not performed for bowel evaluation. Vascular/Lymphatic: Patent abdominal vasculature with atherosclerotic plaque  in the nonaneurysmal abdominal aorta. Retroaortic LEFT renal vein. Celiac nodal enlargement and portacaval nodal enlargement (image 48, series 27) 12 mm portacaval lymph node adjacent to pancreatic head. (Image 90, series 21) 16 mm celiac lymph node.  IVC is patent. No pelvic adenopathy. Pelvic vasculature not well assessed but grossly patent. Reproductive: Thickening and irregularity of the endometrium measuring approximately 4 cm greatest thickness with cystic changes. Myometrial invasion best seen on sagittal image 23, series 35, there is motion on the current exam which limits assessment but suggestion of invasion at least to 50% of the myometrium, oblique post-contrast images raise this question as well but obliquity challenges the ability to determine depth of myometrial invasion Urinary bladder is decompressed, large tumor removed by trans urethral resection. Assessment of urinary bladder is limited by under distension. Other:  No ascites. Musculoskeletal: Vertebral hemangiomata and endplate degenerative changes. IMPRESSION: 1. Hepatic mass suspicious for metastatic disease. Given other findings outlined below biopsy may  be helpful to determine source of metastasis. 2. Endometrial mass with invasive features. Study limited by motion on the current evaluation but imaging suggests myometrial invasion to at least 50% along the anterior uterus. Given above findings depending on results of additional workup; if depth of myometrial invasion will change patient management a repeat evaluation may be helpful in the outpatient setting. 3. Celiac and portacaval nodal enlargement, suspicious for nodal metastases. 4. Assessment of the urinary bladder is limited by under distension large tumor has been resected. Electronically Signed   By: Zetta Bills M.D.   On: 11/21/2019 08:38   US Pelvis Complete  Result Date: 11/18/2019 CLINICAL DATA:  Postmenopausal patient with vaginal bleeding. EXAM: TRANSABDOMINAL ULTRASOUND  OF PELVIS TECHNIQUE: Transabdominal ultrasound examination of the pelvis was performed including evaluation of the uterus, ovaries, adnexal regions, and pelvic cul-de-sac. COMPARISON:  None. FINDINGS: Uterus Measurements: 11.5 x 6.2 x 7.2 cm = volume: 270 mL. No fibroids or other mass visualized. Endometrium Thickness: 3.5 cm.  No focal abnormality visualized. Right ovary Measurements: Not visualized. Left ovary Measurements: Not visualized. Other findings: Imaging of the urinary bladder demonstrates an echogenic lesion within the bladder measuring 4.3 x 2.8 x 6.5 cm. There is flow within the lesion on Doppler imaging. IMPRESSION: Endometrial thickness is considered abnormal for a post-menopausal female. Endometrial sampling should be considered to exclude carcinoma. Lesion within the urinary bladder with flow on Doppler imaging worrisome for neoplasm. Recommend consult with urology. Electronically Signed   By: Inge Rise M.D.   On: 11/18/2019 14:55   DG Pelvis Portable  Result Date: 11/18/2019 CLINICAL DATA:  Status post fall.  Pain.  Initial encounter. EXAM: PORTABLE PELVIS 1-2 VIEWS COMPARISON:  None. FINDINGS: There is no evidence of pelvic fracture or diastasis. No pelvic bone lesions are seen. IMPRESSION: Negative exam. Electronically Signed   By: Inge Rise M.D.   On: 11/18/2019 10:16   NM PET Image Initial (PI) Skull Base To Thigh  Result Date: 12/12/2019 CLINICAL DATA:  Initial treatment strategy for metastatic disease of unknown primary. Prior trans urethral resection of bladder tumor 11/19/2019. Biopsy of liver 11/22/2019. EXAM: NUCLEAR MEDICINE PET SKULL BASE TO THIGH TECHNIQUE: 14.4 mCi F-18 FDG was injected intravenously. Full-ring PET imaging was performed from the skull base to thigh after the radiotracer. CT data was obtained and used for attenuation correction and anatomic localization. Fasting blood glucose: 137 mg/dl COMPARISON:  Chest CT 11/22/2019. Abdominopelvic MRI  11/21/2019. Abdominopelvic CT of 11/20/2019. FINDINGS: Mediastinal blood pool activity: SUV max 3.3 Liver activity: SUV max NA NECK: No areas of abnormal hypermetabolism. Incidental CT findings: Cerebral atrophy.  No cervical adenopathy. CHEST: No pulmonary parenchymal or thoracic nodal hypermetabolism. No hypermetabolism to correspond to the previously described macrolobulated anterior mediastinal mass. 4.3 x 3.1 cm and 14.5 HU on 80/3. Incidental CT findings: Deferred to recent diagnostic CT. Cardiomegaly. Aortic and coronary artery atherosclerosis. ABDOMEN/PELVIS: Segment 4 B hypermetabolic liver mass measures 4.9 x 3.9 cm and a S.U.V. max of 22.0 on 145/3. Porta hepatis nodal metastasis at 1.2 cm and a S.U.V. max of 10.2 on 135/3. More equivocal abdominal retroperitoneal nodes, including at 1.1 cm and a S.U.V. max of 3.6 in the pre caval space on 151/3. No pelvic nodal hypermetabolism. Hypermetabolism corresponding to endometrial soft tissue fullness and heterogeneity. Example at on the order of 4.5 cm and a S.U.V. max of 14.1 on 220/3. The bladder is not well evaluated secondary to physiologic urinary hypermetabolism. There is also urinary contamination about the  perineum. Incidental CT findings: Deferred to recent CT and MRI. Abdominal aortic atherosclerosis. Foley catheter within the urinary bladder. Left ureteric stent without significant hydronephrosis. SKELETON: No abnormal marrow activity. Incidental CT findings: none IMPRESSION: 1. Hypermetabolic endometrial mass, consistent with carcinoma. 2. Metastatic disease to the liver and upper abdominal node or nodes as detailed above. Given the distribution of metastasis, and the extent of endometrial hypermetabolism, endometrial primary is favored. 3. No thoracic hypermetabolic metastasis. The macrolobulated anterior mediastinal mass is not significantly hypermetabolic. Favor pericardial or bronchogenic cyst. Thymoma or less likely thymic carcinoma cannot be  entirely excluded but are felt less likely. Given comorbidities, of questionable clinical significance. Consider chest CT follow-up at 3 months. Electronically Signed   By: Abigail Miyamoto M.D.   On: 12/12/2019 14:46   US BIOPSY (LIVER)  Result Date: 11/22/2019 INDICATION: 69 year old female with a history of liver mass, possible gynecologic malignancy [place] EXAM: ULTRASOUND-GUIDED LIVER MASS BIOPSY MEDICATIONS: None. ANESTHESIA/SEDATION: Moderate (conscious) sedation was employed during this procedure. A total of Versed 1.0 mg and Fentanyl 50 mcg was administered intravenously. Moderate Sedation Time: 10 minutes. The patient's level of consciousness and vital signs were monitored continuously by radiology nursing throughout the procedure under my direct supervision. FLUOROSCOPY TIME:  Ultrasound COMPLICATIONS: None PROCEDURE: Informed written consent was obtained from the patient after a thorough discussion of the procedural risks, benefits and alternatives. All questions were addressed. Maximal Sterile Barrier Technique was utilized including caps, mask, sterile gowns, sterile gloves, sterile drape, hand hygiene and skin antiseptic. A timeout was performed prior to the initiation of the procedure. Ultrasound survey of the liver performed with images stored and sent to PACs. The epigastric region was prepped with chlorhexidine in a sterile fashion, and a sterile drape was applied covering the operative field. A sterile gown and sterile gloves were used for the procedure. Local anesthesia was provided with 1% Lidocaine. The patient was prepped and draped sterilely and the skin and subcutaneous tissues were generously infiltrated with 1% lidocaine. A 17 gauge introducer needle was then advanced under ultrasound guidance in sub costal location into the liver, targeting the hypoechoic mass. The stylet was removed, and multiple separate 18 gauge core biopsy were retrieved. Samples were placed into formalin for  transportation to the lab. Gel-Foam pledgets were then infused with a small amount of saline for assistance with hemostasis. The needle was removed, and a final ultrasound image was performed. The patient tolerated the procedure well and remained hemodynamically stable throughout. No complications were encountered and no significant blood loss was encounter. IMPRESSION: Status post ultrasound-guided biopsy of liver mass Signed, Dulcy Fanny. Dellia Nims, RPVI [facility] Radiology Electronically Signed   By: Corrie Mckusick D.O.   On: 11/22/2019 12:25   DG Chest Portable 1 View  Result Date: 11/18/2019 CLINICAL DATA:  Status post fall today. EXAM: PORTABLE CHEST 1 VIEW COMPARISON:  None. FINDINGS: The lungs are clear. Heart size is upper normal. No pneumothorax or pleural effusion. No acute or focal bony abnormality. IMPRESSION: No acute disease. Electronically Signed   By: Inge Rise M.D.   On: 11/18/2019 10:12   DG Knee Complete 4 Views Left  Result Date: 11/18/2019 CLINICAL DATA:  Status post fall. Right knee pain. Initial encounter. EXAM: LEFT KNEE - COMPLETE 4+ VIEW COMPARISON:  None. FINDINGS: There is no acute bony or joint abnormality. Severe tricompartmental osteoarthritis noted. No joint effusion. IMPRESSION: No acute finding. Severe tricompartmental osteoarthritis. Electronically Signed   By: Marcello Moores  Dalessio M.D.   On: 11/18/2019 10:15   DG OR UROLOGY CYSTO IMAGE ([facility] ONLY)  Result Date: 11/20/2019 There is no interpretation for this exam.  This order is for images obtained during a surgical procedure.  Please See "Surgeries" Tab for more information regarding the procedure.   ECHOCARDIOGRAM COMPLETE  Result Date: 11/19/2019    ECHOCARDIOGRAM REPORT   Patient Name:   [facility] Date of Exam: 11/19/2019 Medical Rec #:  [id number]        Height:       65.0 in Accession #:    [id number]       Weight:       292.8 lb Date of Birth:  1951-02-04         BSA:          2.326 m Patient Age:    46 years         BP:           110/72 mmHg Patient Gender: F                HR:           89 bpm. Exam Location:  [facility] Procedure: 2D Echo, Cardiac Doppler and Color Doppler Indications:     Abnormal ECG 794.31  History:         Patient has prior history of Echocardiogram examinations, most                  recent 03/23/2016. Stroke; Risk Factors:Hypertension and                  Diabetes.  Sonographer:     Sherrie Sport RDCS (AE) Referring Phys:  [id number] [facility] AMIN Diagnosing Phys: Harrell Gave End MD  Sonographer Comments: Technically challenging study due to limited acoustic windows, no apical window and no subcostal window. IMPRESSIONS  1. Left ventricular ejection fraction, by estimation, is >55%. The left ventricle has normal function. Left ventricular endocardial border not optimally defined to evaluate regional wall motion. There is severe left ventricular hypertrophy. Left ventricular diastolic function could not be evaluated.  2. Right ventricular systolic function is normal. The right ventricular size is not well visualized. Tricuspid regurgitation signal is inadequate for assessing PA pressure.  3. The mitral valve is grossly normal. Trivial mitral valve regurgitation.  4. The aortic valve is grossly normal. Aortic valve regurgitation not well assessed. FINDINGS  Left Ventricle: Left ventricular ejection fraction, by estimation, is >55%. The left ventricle has normal function. Left ventricular endocardial border not optimally defined to evaluate regional wall motion. The left ventricular internal cavity size was  normal in size. There is severe left ventricular hypertrophy. Left ventricular diastolic function could not be evaluated. Right Ventricle: The right ventricular size is not well visualized. No increase in right ventricular wall thickness. Right ventricular systolic function is normal. Tricuspid regurgitation signal is inadequate for assessing PA  pressure. Left Atrium: Left atrial size was not well visualized. Right Atrium: Right atrial size was not well visualized. Pericardium: The pericardium was not well visualized. Mitral Valve: The mitral valve is grossly normal. Mild mitral annular calcification. Trivial mitral valve regurgitation. Tricuspid Valve: The tricuspid valve is grossly normal. Tricuspid valve regurgitation is trivial. Aortic Valve: The aortic valve is grossly normal. Aortic valve regurgitation not well assessed. Pulmonic Valve: The pulmonic valve was not well visualized. Pulmonic valve regurgitation is not visualized. No evidence of pulmonic stenosis. Aorta: The aortic root is normal in size and structure. Pulmonary  Artery: The pulmonary artery is of normal size. IAS/Shunts: The interatrial septum was not well visualized.  LEFT VENTRICLE PLAX 2D LVIDd:         4.60 cm LVIDs:         2.97 cm LV PW:         1.89 cm LV IVS:        2.25 cm LVOT diam:     2.00 cm LVOT Area:     3.14 cm  LEFT ATRIUM         Index LA diam:    5.70 cm 2.45 cm/m                        PULMONIC VALVE AORTA                 PV Vmax:        0.72 m/s Ao Root diam: 3.50 cm PV Peak grad:   2.1 mmHg                       RVOT Peak grad: 2 mmHg   SHUNTS Systemic Diam: 2.00 cm Nelva Bush MD Electronically signed by Nelva Bush MD Signature Date/Time: 11/19/2019/6:33:33 PM    Final    CT HEMATURIA WORKUP  Addendum Date: 11/20/2019   ADDENDUM REPORT: 11/20/2019 12:05 ADDENDUM: The submitted images are without intravenous contrast. In addition to the above findings, there is an oval, heterogeneous, faintly visualized mass in the inferior aspect of the medial segment of the left lobe of the liver, adjacent to the gallbladder fossa, measuring 4.3 x 3.6 cm in maximum dimensions on image number 31 series 4. This is concerning for a liver metastasis. These results will be called to the ordering clinician or representative by the Radiologist Assistant, and communication  documented in the PACS or [employer]. Electronically Signed   By: Claudie Revering M.D.   On: 11/20/2019 12:05   Result Date: 11/20/2019 CLINICAL DATA:  Hematuria.  Weakness. EXAM: CT ABDOMEN AND PELVIS WITHOUT AND WITH CONTRAST TECHNIQUE: Multidetector CT imaging of the abdomen and pelvis was performed following the standard protocol before and following the bolus administration of intravenous contrast. CONTRAST:  12m OMNIPAQUE IOHEXOL 300 MG/ML  SOLN COMPARISON:  Pelvic ultrasound obtained yesterday. FINDINGS: Lower chest: Enlarged heart. Minimal pericardial effusion with a maximum thickness of 11 mm. Minimal bibasilar atelectasis or scarring. Hepatobiliary: 2 tiny gallstones in the gallbladder and ill-defined soft tissue density in the dependent portion of the gallbladder. No gallbladder wall thickening or pericholecystic fluid. Unremarkable liver. Pancreas: Unremarkable. No pancreatic ductal dilatation or surrounding inflammatory changes. Spleen: Normal in size without focal abnormality. Adrenals/Urinary Tract: Rounded fat density left adrenal mass measuring 2.0 cm on image number 23 series 2. Normal appearing right adrenal gland. Mild-to-moderate dilatation of the left renal collecting system and ureter to the level of the ureterovesical junction. At that location, there is an irregular, rounded mass in the bladder measuring 3.7 x 3.7 cm on image number 78 series 2 with a small anterior calcification. This mass measures 4.0 cm in length on sagittal image number 75. To the right of midline in the dependent portion of the bladder, there is a 2nd mass measures 3.1 x 1.8 x 1.7 cm on sagittal image number 67 and axial image number 78 series 2. 3 mm right renal calculus and 1.4 cm rounded, medium density mass arising from the lateral aspect of the midportion of the right kidney,  measured on image number 28 series 2. Normal appearing right ureter. Stomach/Bowel: Stomach is within normal limits. Appendix appears  normal. No evidence of bowel wall thickening, distention, or inflammatory changes. Vascular/Lymphatic: Atheromatous arterial calcifications without aneurysm. No enlarged lymph nodes. Reproductive: Heterogeneous endometrial mass measuring 4.5 x 4.0 cm on image number 60 series 2 and 6.3 cm in length on sagittal image number 80. No adnexal mass seen. Other: Small amount of subcutaneous air anteriorly on the right, compatible with recent subcutaneous injection. There is also bilateral subcutaneous edema at the level of the lower abdomen, greater on the left. Musculoskeletal: Lumbar and lower thoracic spine degenerative changes. IMPRESSION: 1. 4.0 x 3.7 x 3.7 cm irregular, rounded mass in the bladder on the left, compatible with a primary bladder carcinoma. This is obstructing the distal left ureter causing mild to moderate left hydronephrosis and hydroureter. 2. 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 3. 3 mm nonobstructing right renal calculus and 1.4 cm right renal mass or proteinaceous cyst. 4. 6.3 x 4.5 x 4.0 cm heterogeneous endometrial mass. 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 5. Calcified cholelithiasis and possible noncalcified cholelithiasis or sludge in the gallbladder 6. Minimal pericardial effusion. 7. Cardiomegaly. 8. 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 Electronically Signed: By: Claudie Revering M.D. On: 11/19/2019 14:21    PERFORMANCE STATUS (ECOG) : 3 - Symptomatic, >50% confined to bed  Review of Systems Unless otherwise noted, a complete review of systems is negative.  Physical Exam General: NAD, morbidly obese Pulmonary: Unlabored Extremities: no edema, no joint deformities Skin: no rashes Neurological: Weakness but otherwise nonfocal  IMPRESSION: Met with patient today to discuss goals.  Introduced palliative care services and attempted to establish therapeutic rapport.  Patient feels she is  doing reasonably well.  She denies any significant symptomatic complaints today.  She feels like she is doing better since discharging from the hospital.  Patient says that she is still trying to "process" her cancer diagnosis.  Chemotherapy has been discussed as an option but patient remains undecided.  She will see Dr. Janese Banks today to discuss treatment options.  Remains at [facility] for rehab.  She says that she is doing better with ambulating in the hallways with use of an assistive device.  Patient says that there is not currently a date set for discharge from the SNF.  Ultimate plan is for her to return home.  Patient says that she has both Medicare and Medicaid.  Medicaid would increase options for in-home care including [facility]S, PCS, or [facility]ACE.  I encouraged patient to speak with the facility social worker to help coordinate resources.  I will also make a referral for palliative care to follow at the SNF.  Patient says that she does not have any ACP documents established.  She would want her son to be her decision-maker if necessary.  Note that in 2017, patient was DNR while hospitalized.  Today, she was undecided about CODE STATUS.  I suggested that she think about this decision and talk with her family.  She will benefit from future completion of a MOST form.  PLAN: -Continue current scope of treatment -Referral to palliative care at SNF -We will benefit from future MOST form -RTC in 2 to 3 weeks   Patient expressed understanding and was in agreement with this plan. She also understands that She can call the clinic at any time with any questions, concerns,  or complaints.     Time Total: 20 minutes  Visit consisted of counseling and education dealing with the complex and emotionally intense issues of symptom management and palliative care in the setting of serious and potentially life-threatening illness.Greater than 50%  of this time was spent counseling and coordinating care related to the  above assessment and plan.  Signed by: Altha Harm, PhD, NP-C

## 2019-12-14 NOTE — Telephone Encounter (Signed)
Brittney Shepard, they have decided they would like for her to see gyn onc. She is coming in today at 3p to see Dr. Janese Banks. Will you see if she can come on 8/18 at 3p? If she can, will you please schedule her and notify [facility].

## 2019-12-15 ENCOUNTER — Encounter: Payer: Self-pay | Admitting: Internal Medicine

## 2019-12-15 ENCOUNTER — Telehealth: Payer: Self-pay | Admitting: Oncology

## 2019-12-15 NOTE — Telephone Encounter (Signed)
12/15/2019  Called and confirmed palliative care appt w/ Josh Borders on 8/27 @ 2:00 pm, right after follow up w/ Dr. Janese Banks. Appt made per NP request. SRW

## 2019-12-15 NOTE — Progress Notes (Signed)
Hematology/Oncology Consult note [facility]  [phone] [phone]  Patient Care Team: Marguerita Merles, MD as PCP - General (Family Medicine) Kate Sable, MD as PCP - Cardiology (Cardiology) Clent Jacks, RN as Oncology Nurse Navigator   Name of the patient: Brittney Shepard  [id number]  21-Jun-1950   Date of visit: 12/15/19  Diagnosis- 1.  Superficial nonmuscle invasive bladder cancer 2.  Liver metastases: Carcinoma of unknown primary 3.  Endometrial mass-no biopsy at  Chief complaint/ Reason for visit-discuss further management of carcinoma of unknown primary  Heme/Onc history:  Oncology History Overview Note  # 2021- Liver bx-poorly differentiated CK 7+ carcinoma- DIAGNOSIS:  A. LIVER; ULTRASOUND-GUIDED CORE NEEDLE BIOPSY:  - POSITIVE FOR MALIGNANCY.  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   Comment:  Immunohistochemical studies show tumor cells to be positive for CK7 and  CK19, with focal faint expression of CD20. Tumor cells are negative for  CDX-2, GATA-3, TTF-1, and Pax-8. Additional stains were attempted, but  malignant tissue is exhausted in the core utilized for these studies.   The immunohistochemical pattern is relatively non-specific. The  patient's recent diagnosis of invasive urothelial carcinoma is noted,  but the histologic pattern does not appear similar, and lack of staining  for GATA3 would suggest against this. Concern for a gynecologic  malignancy is also noted, but lack of marking for Pax-8 would suggest  against this. CK7 positivity may be seen in carcinomas arising in the  upper GI tract, lung, pancreatobiliary tree, and thymus, among other  sites. Lack of marking for TTF-1 would suggest against lung. The  presence of CK19 staining, while not specific, may be seen in carcinomas  arising from the pancreatobiliary tree, as well as urinary bladder,  endometrium, lung, and kidney. Recent  laboratory testing indicates liver  function testing within normal limits. Clinical and radiographic  correlation is recommended.   # JULY 2021- cystocopy- DIAGNOSIS: A. 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  - NO MUSCULARIS PROPRIA IDENTIFIED.   B. 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  - MUSCULARIS PROPRIA PRESENT AND UNINVOLVED.   # ENDOMETRIAL MASS [Dr.Staebler]- unable to Bx- sec to cervical stenosis  # Mediastinal mass  Lobulated anterior mediastinal 4.1 x 3.6 cm soft tissue mass, indeterminate for nodal metastasis versus primary thymic neoplasm. No additional potential findings of metastatic disease in the chest. 3. Mild cardiomegaly. Three-vessel coronary atherosclerosis. 4. Left adrenal myelolipoma. 5. Exophytic isodense 1.3 cm renal cortical lesion in the lateral upper right kidney  # CHF-compensated; morbid obesity; diabetes; COPD [not on O2]; A. Fib- not on anticoagulation.    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[place])  11/22/2019 Initial Diagnosis   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[place])   12/04/2019 -  Chemotherapy   The patient had dexamethasone (DECADRON) 4 MG tablet, 8 mg, Oral, Daily, 0 of 1 cycle, Start date: --, End date: -- palonosetron (ALOXI) injection 0.25 mg, 0.25 mg, Intravenous,  Once, 0 of 6 cycles pegfilgrastim (NEULASTA ONPRO KIT) injection 6 mg, 6 mg, Subcutaneous, Once, 0 of 6 cycles CARBOplatin (PARAPLATIN) in sodium chloride 0.9 % 100 mL chemo infusion, , Intravenous,  Once, 0 of 6 cycles fosaprepitant (EMEND) 150 mg in sodium chloride 0.9 % 145 mL IVPB, 150 mg, Intravenous,  Once, 0 of 6 cycles PACLitaxel (TAXOL) 558 mg in sodium chloride 0.9 % 500 mL  chemo infusion (> 54m/m2), 225 mg/m2, Intravenous,  Once, 0 of 6 cycles  for  chemotherapy treatment.    Liver metastases ([place]   Initial Diagnosis   Liver metastases ([facility]   12/04/2019 -  Chemotherapy   The patient had dexamethasone (DECADRON) 4 MG tablet, 8 mg, Oral, Daily, 0 of 1 cycle, Start date: --, End date: -- palonosetron (ALOXI) injection 0.25 mg, 0.25 mg, Intravenous,  Once, 0 of 6 cycles pegfilgrastim (NEULASTA ONPRO KIT) injection 6 mg, 6 mg, Subcutaneous, Once, 0 of 6 cycles CARBOplatin (PARAPLATIN) in sodium chloride 0.9 % 100 mL chemo infusion, , Intravenous,  Once, 0 of 6 cycles fosaprepitant (EMEND) 150 mg in sodium chloride 0.9 % 145 mL IVPB, 150 mg, Intravenous,  Once, 0 of 6 cycles PACLitaxel (TAXOL) 558 mg in sodium chloride 0.9 % 500 mL chemo infusion (> 814mm2), 225 mg/m2, Intravenous,  Once, 0 of 6 cycles  for chemotherapy treatment.       Interval history-patient is still at rehab and will likely get discharged by next week.  She hopes to go home to be able to live independently but still requires assistance at the rehab for her ADLs.  She has chronic back pain but denies any new complaints at this time.  Denies any vaginal bleeding  ECOG PS- 2 Pain scale- 0 Opioid associated constipation- no  Review of systems- Review of Systems  Constitutional: Positive for malaise/fatigue. Negative for chills, fever and weight loss.  HENT: Negative for congestion, ear discharge and nosebleeds.   Eyes: Negative for blurred vision.  Respiratory: Negative for cough, hemoptysis, sputum production, shortness of breath and wheezing.   Cardiovascular: Negative for chest pain, palpitations, orthopnea and claudication.  Gastrointestinal: Negative for abdominal pain, blood in stool, constipation, diarrhea, heartburn, melena, nausea and vomiting.  Genitourinary: Negative for dysuria, flank pain, frequency, hematuria and urgency.  Musculoskeletal: Positive for back pain. Negative for joint pain and myalgias.  Skin: Negative for rash.  Neurological: Negative  for dizziness, tingling, focal weakness, seizures, weakness and headaches.  Endo/Heme/Allergies: Does not bruise/bleed easily.  Psychiatric/Behavioral: Negative for depression and suicidal ideas. The patient does not have insomnia.       Allergies  Allergen Reactions  . Penicillin G Hives  . Sulfa Antibiotics Rash     Past Medical History:  Diagnosis Date  . A-fib ([facility]  . Bladder cancer ([facility]  . CHF (congestive heart failure) ([facility]  . Chronic pain   . Cognitive communication deficit   . Diabetes mellitus without complication ([facility]  . Hypertension   . Hypokalemia   . Muscle weakness (generalized)   . 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 Secondary malignant neoplasm of liver ([facility]  . [facility]  . Urinary tract infection, site not specified      Past Surgical History:  Procedure Laterality Date  . CYSTOSCOPY W/ RETROGRADES N/A 11/20/2019   Procedure: CYSTOSCOPY WITH RETROGRADE PYELOGRAM;  Surgeon: SnBilley CoMD;  Location: [facility] ORS;  Service: Urology;  Laterality: N/A;  . CYSTOSCOPY WITH STENT PLACEMENT Left 11/20/2019   Procedure: CYSTOSCOPY WITH STENT PLACEMENT;  Surgeon: SnBilley CoMD;  Location: [facility] ORS;  Service: Urology;  Laterality: Left;  . TRANSURETHRAL RESECTION OF BLADDER TUMOR N/A 11/20/2019   Procedure: TRANSURETHRAL RESECTION OF BLADDER TUMOR (TURBT);  Surgeon: SnBilley CoMD;  Location: [facility] ORS;  Service: Urology;  Laterality: N/A;    Social History   Socioeconomic History  . Marital  status: Widowed    Spouse name: Not on file  . Number of children: Not on file  . Years of education: Not on file  . Highest education level: Not on file  Occupational History  . Not on file  Tobacco Use  . Smoking status: Never Smoker  . Smokeless tobacco: Never Used  Substance and Sexual Activity  . Alcohol use: No  . Drug use: No  . Sexual activity: Not Currently  Other Topics Concern  . Not on file  Social History  Narrative   Retd. 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 Has one son.    Social Determinants of Health   Financial Resource Strain:   . Difficulty of Paying Living Expenses:   Food Insecurity:   . Worried About Charity fundraiser in the Last Year:   . Arboriculturist in the Last Year:   Transportation Needs:   . Film/video editor (Medical):   Marland Kitchen Lack of Transportation (Non-Medical):   Physical Activity:   . Days of Exercise per Week:   . Minutes of Exercise per Session:   Stress:   . Feeling of Stress :   Social Connections:   . Frequency of Communication with Friends and Family:   . Frequency of Social Gatherings with Friends and Family:   . Attends Religious Services:   . Active Member of Clubs or Organizations:   . Attends Archivist Meetings:   Marland Kitchen Marital Status:   Intimate Partner Violence:   . Fear of Current or Ex-Partner:   . Emotionally Abused:   Marland Kitchen Physically Abused:   . Sexually Abused:     Family History  Problem Relation Age of Onset  . Ovarian cancer Mother   . Cancer Sister      Current Outpatient Medications:  .  acetaminophen (TYLENOL) 325 MG tablet, Take 2 tablets (650 mg total) by mouth every 6 (six) hours as needed for mild pain (or Fever >/= 101)., Disp: 60 tablet, Rfl: 0 .  albuterol (VENTOLIN HFA) 108 (90 Base) MCG/ACT inhaler, Inhale 2 puffs into the lungs every 6 (six) hours as needed for wheezing or shortness of breath., Disp: , Rfl:  .  atenolol (TENORMIN) 50 MG tablet, Take 0.5 tablets (25 mg total) by mouth daily., Disp: 15 tablet, Rfl: 0 .  barrier cream (NON-SPECIFIED) CREA, Apply 1 application topically 2 (two) times daily as needed., Disp: , Rfl:  .  diltiazem (CARDIZEM SR) 60 MG 12 hr capsule, Take 1 capsule (60 mg total) by mouth every 12 (twelve) hours., Disp: 60 capsule, Rfl: 0 .  furosemide (LASIX) 40 MG tablet, Take 1 tablet (40 mg total) by mouth 2 (two) times daily., Disp: 30 tablet, Rfl: 0 .   metFORMIN (GLUCOPHAGE) 500 MG tablet, Take 500 mg by mouth 2 (two) times daily with a meal. , Disp: , Rfl:  .  Multiple Vitamin (MULTIVITAMIN) tablet, Take 1 tablet by mouth daily., Disp: , Rfl:  .  polyethylene glycol (MIRALAX / GLYCOLAX) 17 g packet, Take 17 g by mouth daily as needed for mild constipation., Disp: 14 each, Rfl: 0 .  senna-docusate (SENOKOT-S) 8.6-50 MG tablet, Take 1 tablet by mouth 2 (two) times daily., Disp: 60 tablet, Rfl: 0 .  aspirin EC 81 MG tablet, Take 1 tablet (81 mg total) by mouth daily. Swallow whole. (Patient not taking: Reported on 12/14/2019), Disp: 90 tablet, Rfl: 3 .  ipratropium-albuterol (DUONEB) 0.5-2.5 (3) MG/3ML SOLN, Take  3 mLs by nebulization 3 (three) times daily. (Patient not taking: Reported on 12/14/2019), Disp: 360 mL, Rfl: 0 .  nystatin (NYSTATIN) powder, Apply 1 application topically 2 (two) times daily. (Patient not taking: Reported on 12/14/2019), Disp: , Rfl:  .  potassium chloride (KLOR-CON) 10 MEQ tablet, Take 10 mEq by mouth 2 (two) times daily. (Patient not taking: Reported on 12/14/2019), Disp: , Rfl:   Physical exam:  Vitals:   12/14/19 1458  BP: 103/67  Pulse: 80  Resp: 18  Temp: (!) 97.3 F (36.3 C)  TempSrc: Tympanic  SpO2: 95%  Weight: 270 lb (122.5 kg)   Physical Exam Constitutional:      Appearance: She is obese.     Comments: She is sitting in a wheelchair.  Appears in no acute distress  Cardiovascular:     Rate and Rhythm: Normal rate and regular rhythm.     Heart sounds: Normal heart sounds.  Pulmonary:     Effort: Pulmonary effort is normal.     Breath sounds: Normal breath sounds.  Abdominal:     General: Bowel sounds are normal.     Palpations: Abdomen is soft.  Skin:    General: Skin is warm and dry.  Neurological:     Mental Status: She is alert and oriented to person, place, and time.      CMP Latest Ref Rng & Units 12/04/2019  Glucose 70 - 99 mg/dL 175(H)  BUN 8 - 23 mg/dL 23  Creatinine 0.44 - 1.00 mg/dL  0.97  Sodium 135 - 145 mmol/L 134(L)  Potassium 3.5 - 5.1 mmol/L 3.6  Chloride 98 - 111 mmol/L 94(L)  CO2 22 - 32 mmol/L 28  Calcium 8.9 - 10.3 mg/dL 8.0(L)  Total Protein 6.5 - 8.1 g/dL 7.4  Total Bilirubin 0.3 - 1.2 mg/dL 1.0  Alkaline Phos 38 - 126 U/L 87  AST 15 - 41 U/L 24  ALT 0 - 44 U/L 16   CBC Latest Ref Rng & Units 12/04/2019  WBC 4.0 - 10.5 K/uL 9.1  Hemoglobin 12.0 - 15.0 g/dL 12.6  Hematocrit 36 - 46 % 39.7  Platelets 150 - 400 K/uL 286    No images are attached to the encounter.  DG Tibia/Fibula Right  Result Date: 11/18/2019 CLINICAL DATA:  Status post fall. Right lower leg pain. Initial encounter. EXAM: RIGHT TIBIA AND FIBULA - 2 VIEW COMPARISON:  None. FINDINGS: No acute bony or joint abnormality. Severe knee and talonavicular osteoarthritis noted. Soft tissues are unremarkable. IMPRESSION: No acute abnormality. Electronically Signed   By: Inge Rise M.D.   On: 11/18/2019 10:16   CT CHEST WO CONTRAST  Result Date: 11/21/2019 CLINICAL DATA:  Inpatient. New diagnosis of bladder cancer. Endometrial mass. Liver mass. Chest staging. EXAM: CT CHEST WITHOUT CONTRAST TECHNIQUE: Multidetector CT imaging of the chest was performed following the standard protocol without IV contrast. COMPARISON:  11/29/2019 MRI abdomen/pelvis. 11/19/2019 CT abdomen/pelvis. 11/18/2019 chest radiograph. FINDINGS: Motion degraded scan, particularly at the lung bases, limiting assessment. Cardiovascular: Mild cardiomegaly. No significant pericardial effusion/thickening. Three-vessel coronary atherosclerosis. Atherosclerotic nonaneurysmal thoracic aorta. Top-normal caliber main pulmonary artery (3 point cm diameter). Mediastinum/Nodes: No discrete thyroid nodules. Unremarkable esophagus. No axillary adenopathy. Lobulated anterior mediastinal 4.1 x 3.6 cm soft tissue mass (series 2/image 46). Otherwise no pathologically enlarged mediastinal or discrete hilar nodes on these noncontrast images.  Lungs/Pleura: No pneumothorax. No pleural effusion. No acute consolidative airspace disease, lung masses or significant pulmonary nodules. 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  Exophytic isodense 1.3 cm renal cortical lesion in the lateral upper right kidney (series 2/image 145), not well characterized on MRI from earlier today due to motion degradation. Musculoskeletal: No aggressive appearing focal osseous lesions. Moderate thoracic spondylosis. IMPRESSION: 1. Limited motion degraded scan. 2. 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 No additional potential findings of metastatic disease in the chest. 3. Mild cardiomegaly. Three-vessel coronary atherosclerosis. 4. Left adrenal myelolipoma. 5. Exophytic isodense 1.3 cm renal cortical lesion in the lateral upper right kidney, not well characterized on MRI from earlier today due to motion degradation. Suggest attention on follow-up CT or MRI abdomen without and with IV contrast in 6-12 months. 6. Aortic Atherosclerosis (ICD10-I70.0). Electronically Signed   By: Ilona Sorrel M.D.   On: 11/21/2019 16:24   MR PELVIS W WO CONTRAST  Result Date: 11/21/2019 CLINICAL DATA:  Bladder and endometrial masses., concern for hepatic metastatic disease EXAM: MRI ABDOMEN AND PELVIS WITHOUT AND WITH CONTRAST TECHNIQUE: Multiplanar multisequence MR imaging of the abdomen and pelvis was performed both before and after the administration of intravenous contrast. CONTRAST:  54m GADAVIST GADOBUTROL 1 MMOL/ML IV SOLN COMPARISON:  CT evaluation of November 19, 2019 FINDINGS: COMBINED FINDINGS FOR BOTH MR ABDOMEN AND PELVIS Lower chest: No effusion or consolidation. Cardiomegaly. Heart is incompletely imaged. Hepatobiliary: Mass in hepatic subsegment IV B along the gallbladder fossa showing target like appearance measuring approximately 3.9 x 3.1 cm. (Image 48, series 28) mass abuts the  gallbladder. Trace pericholecystic fluid. No significant wall enhancement or pericholecystic stranding. No biliary duct dilation. Small satellite lesions seen just inferior to the dominant mass measuring approximately 1 cm (image 57, series 28) Pancreas: Multi cystic changes throughout the pancreas (image 25, series 1812 mm cystic lesion in the head of the pancreas. Between 10 and 15 additional small cystic foci about the pancreas with pancreatic atrophy and mild main duct distension. An example of another lesion is a 13 mm lesion in the uncinate (image 28, series 18) areas without overtly suspicious features otherwise. Spleen:  Spleen normal in size and contour. 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 No sign of hydronephrosis. Small RIGHT renal cyst. Stomach/Bowel: Gastrointestinal tract is normal to the extent evaluated. Study not performed for bowel evaluation. Vascular/Lymphatic: Patent abdominal vasculature with atherosclerotic plaque in the nonaneurysmal abdominal aorta. Retroaortic LEFT renal vein. Celiac nodal enlargement and portacaval nodal enlargement (image 48, series 27) 12 mm portacaval lymph node adjacent to pancreatic head. (Image 90, series 21) 16 mm celiac lymph node.  IVC is patent. No pelvic adenopathy. Pelvic vasculature not well assessed but grossly patent. Reproductive: Thickening and irregularity of the endometrium measuring approximately 4 cm greatest thickness with cystic changes. Myometrial invasion best seen on sagittal image 23, series 35, there is motion on the current exam which limits assessment but suggestion of invasion at least to 50% of the myometrium, oblique post-contrast images raise this question as well but obliquity challenges the ability to determine depth of myometrial invasion Urinary bladder is decompressed, large tumor removed by trans urethral resection. Assessment of urinary bladder is limited by under distension. Other:  No ascites.  Musculoskeletal: Vertebral hemangiomata and endplate degenerative changes. IMPRESSION: 1. Hepatic mass suspicious for metastatic disease. Given other findings outlined below biopsy may be helpful to determine source of metastasis. 2. Endometrial mass with invasive features. Study limited by motion on the current evaluation but imaging suggests myometrial invasion to at least 50% along  the anterior uterus. Given above findings depending on results of additional workup; if depth of myometrial invasion will change patient management a repeat evaluation may be helpful in the outpatient setting. 3. Celiac and portacaval nodal enlargement, suspicious for nodal metastases. 4. Assessment of the urinary bladder is limited by under distension large tumor has been resected. Electronically Signed   By: Zetta Bills M.D.   On: 11/21/2019 08:38   MR ABDOMEN W WO CONTRAST  Result Date: 11/21/2019 CLINICAL DATA:  Bladder and endometrial masses., concern for hepatic metastatic disease EXAM: MRI ABDOMEN AND PELVIS WITHOUT AND WITH CONTRAST TECHNIQUE: Multiplanar multisequence MR imaging of the abdomen and pelvis was performed both before and after the administration of intravenous contrast. CONTRAST:  23m GADAVIST GADOBUTROL 1 MMOL/ML IV SOLN COMPARISON:  CT evaluation of November 19, 2019 FINDINGS: COMBINED FINDINGS FOR BOTH MR ABDOMEN AND PELVIS Lower chest: No effusion or consolidation. Cardiomegaly. Heart is incompletely imaged. Hepatobiliary: Mass in hepatic subsegment IV B along the gallbladder fossa showing target like appearance measuring approximately 3.9 x 3.1 cm. (Image 48, series 28) mass abuts the gallbladder. Trace pericholecystic fluid. No significant wall enhancement or pericholecystic stranding. No biliary duct dilation. Small satellite lesions seen just inferior to the dominant mass measuring approximately 1 cm (image 57, series 28) Pancreas: Multi cystic changes throughout the pancreas (image 25, series 1812 mm  cystic lesion in the head of the pancreas. Between 10 and 15 additional small cystic foci about the pancreas with pancreatic atrophy and mild main duct distension. An example of another lesion is a 13 mm lesion in the uncinate (image 28, series 18) areas without overtly suspicious features otherwise. Spleen:  Spleen normal in size and contour. 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 No sign of hydronephrosis. Small RIGHT renal cyst. Stomach/Bowel: Gastrointestinal tract is normal to the extent evaluated. Study not performed for bowel evaluation. Vascular/Lymphatic: Patent abdominal vasculature with atherosclerotic plaque in the nonaneurysmal abdominal aorta. Retroaortic LEFT renal vein. Celiac nodal enlargement and portacaval nodal enlargement (image 48, series 27) 12 mm portacaval lymph node adjacent to pancreatic head. (Image 90, series 21) 16 mm celiac lymph node.  IVC is patent. No pelvic adenopathy. Pelvic vasculature not well assessed but grossly patent. Reproductive: Thickening and irregularity of the endometrium measuring approximately 4 cm greatest thickness with cystic changes. Myometrial invasion best seen on sagittal image 23, series 35, there is motion on the current exam which limits assessment but suggestion of invasion at least to 50% of the myometrium, oblique post-contrast images raise this question as well but obliquity challenges the ability to determine depth of myometrial invasion Urinary bladder is decompressed, large tumor removed by trans urethral resection. Assessment of urinary bladder is limited by under distension. Other:  No ascites. Musculoskeletal: Vertebral hemangiomata and endplate degenerative changes. IMPRESSION: 1. Hepatic mass suspicious for metastatic disease. Given other findings outlined below biopsy may be helpful to determine source of metastasis. 2. Endometrial mass with invasive features. Study limited by motion on the current evaluation but  imaging suggests myometrial invasion to at least 50% along the anterior uterus. Given above findings depending on results of additional workup; if depth of myometrial invasion will change patient management a repeat evaluation may be helpful in the outpatient setting. 3. Celiac and portacaval nodal enlargement, suspicious for nodal metastases. 4. Assessment of the urinary bladder is limited by under distension large tumor has been resected. Electronically Signed   By: GZetta BillsM.D.   On: 11/21/2019 08:38  US Pelvis Complete  Result Date: 11/18/2019 CLINICAL DATA:  Postmenopausal patient with vaginal bleeding. EXAM: TRANSABDOMINAL ULTRASOUND OF PELVIS TECHNIQUE: Transabdominal ultrasound examination of the pelvis was performed including evaluation of the uterus, ovaries, adnexal regions, and pelvic cul-de-sac. COMPARISON:  None. FINDINGS: Uterus Measurements: 11.5 x 6.2 x 7.2 cm = volume: 270 mL. No fibroids or other mass visualized. Endometrium Thickness: 3.5 cm.  No focal abnormality visualized. Right ovary Measurements: Not visualized. Left ovary Measurements: Not visualized. Other findings: Imaging of the urinary bladder demonstrates an echogenic lesion within the bladder measuring 4.3 x 2.8 x 6.5 cm. There is flow within the lesion on Doppler imaging. IMPRESSION: Endometrial thickness is considered abnormal for a post-menopausal female. Endometrial sampling should be considered to exclude carcinoma. Lesion within the urinary bladder with flow on Doppler imaging worrisome for neoplasm. Recommend consult with urology. Electronically Signed   By: Inge Rise M.D.   On: 11/18/2019 14:55   DG Pelvis Portable  Result Date: 11/18/2019 CLINICAL DATA:  Status post fall.  Pain.  Initial encounter. EXAM: PORTABLE PELVIS 1-2 VIEWS COMPARISON:  None. FINDINGS: There is no evidence of pelvic fracture or diastasis. No pelvic bone lesions are seen. IMPRESSION: Negative exam. Electronically Signed   By:  Inge Rise M.D.   On: 11/18/2019 10:16   NM PET Image Initial (PI) Skull Base To Thigh  Result Date: 12/12/2019 CLINICAL DATA:  Initial treatment strategy for metastatic disease of unknown primary. Prior trans urethral resection of bladder tumor 11/19/2019. Biopsy of liver 11/22/2019. EXAM: NUCLEAR MEDICINE PET SKULL BASE TO THIGH TECHNIQUE: 14.4 mCi F-18 FDG was injected intravenously. Full-ring PET imaging was performed from the skull base to thigh after the radiotracer. CT data was obtained and used for attenuation correction and anatomic localization. Fasting blood glucose: 137 mg/dl COMPARISON:  Chest CT 11/22/2019. Abdominopelvic MRI 11/21/2019. Abdominopelvic CT of 11/20/2019. FINDINGS: Mediastinal blood pool activity: SUV max 3.3 Liver activity: SUV max NA NECK: No areas of abnormal hypermetabolism. Incidental CT findings: Cerebral atrophy.  No cervical adenopathy. CHEST: No pulmonary parenchymal or thoracic nodal hypermetabolism. No hypermetabolism to correspond to the previously described macrolobulated anterior mediastinal mass. 4.3 x 3.1 cm and 14.5 HU on 80/3. Incidental CT findings: Deferred to recent diagnostic CT. Cardiomegaly. Aortic and coronary artery atherosclerosis. ABDOMEN/PELVIS: Segment 4 B hypermetabolic liver mass measures 4.9 x 3.9 cm and a S.U.V. max of 22.0 on 145/3. Porta hepatis nodal metastasis at 1.2 cm and a S.U.V. max of 10.2 on 135/3. More equivocal abdominal retroperitoneal nodes, including at 1.1 cm and a S.U.V. max of 3.6 in the pre caval space on 151/3. No pelvic nodal hypermetabolism. Hypermetabolism corresponding to endometrial soft tissue fullness and heterogeneity. Example at on the order of 4.5 cm and a S.U.V. max of 14.1 on 220/3. The bladder is not well evaluated secondary to physiologic urinary hypermetabolism. There is also urinary contamination about the perineum. Incidental CT findings: Deferred to recent CT and MRI. Abdominal aortic atherosclerosis. Foley  catheter within the urinary bladder. Left ureteric stent without significant hydronephrosis. SKELETON: No abnormal marrow activity. Incidental CT findings: none IMPRESSION: 1. Hypermetabolic endometrial mass, consistent with carcinoma. 2. Metastatic disease to the liver and upper abdominal node or nodes as detailed above. Given the distribution of metastasis, and the extent of endometrial hypermetabolism, endometrial primary is favored. 3. No thoracic hypermetabolic metastasis. The macrolobulated anterior mediastinal mass is not significantly hypermetabolic. Favor pericardial or bronchogenic cyst. Thymoma or less likely thymic carcinoma cannot be entirely excluded but are felt  less likely. Given comorbidities, of questionable clinical significance. Consider chest CT follow-up at 3 months. Electronically Signed   By: Abigail Miyamoto M.D.   On: 12/12/2019 14:46   US BIOPSY (LIVER)  Result Date: 11/22/2019 INDICATION: 69 year old female with a history of liver mass, possible gynecologic malignancy [place] EXAM: ULTRASOUND-GUIDED LIVER MASS BIOPSY MEDICATIONS: None. ANESTHESIA/SEDATION: Moderate (conscious) sedation was employed during this procedure. A total of Versed 1.0 mg and Fentanyl 50 mcg was administered intravenously. Moderate Sedation Time: 10 minutes. The patient's level of consciousness and vital signs were monitored continuously by radiology nursing throughout the procedure under my direct supervision. FLUOROSCOPY TIME:  Ultrasound COMPLICATIONS: None PROCEDURE: Informed written consent was obtained from the patient after a thorough discussion of the procedural risks, benefits and alternatives. All questions were addressed. Maximal Sterile Barrier Technique was utilized including caps, mask, sterile gowns, sterile gloves, sterile drape, hand hygiene and skin antiseptic. A timeout was performed prior to the initiation of the procedure. Ultrasound survey of the liver performed with images stored and sent to  PACs. The epigastric region was prepped with chlorhexidine in a sterile fashion, and a sterile drape was applied covering the operative field. A sterile gown and sterile gloves were used for the procedure. Local anesthesia was provided with 1% Lidocaine. The patient was prepped and draped sterilely and the skin and subcutaneous tissues were generously infiltrated with 1% lidocaine. A 17 gauge introducer needle was then advanced under ultrasound guidance in sub costal location into the liver, targeting the hypoechoic mass. The stylet was removed, and multiple separate 18 gauge core biopsy were retrieved. Samples were placed into formalin for transportation to the lab. Gel-Foam pledgets were then infused with a small amount of saline for assistance with hemostasis. The needle was removed, and a final ultrasound image was performed. The patient tolerated the procedure well and remained hemodynamically stable throughout. No complications were encountered and no significant blood loss was encounter. IMPRESSION: Status post ultrasound-guided biopsy of liver mass Signed, Dulcy Fanny. Dellia Nims, RPVI [facility] Radiology Electronically Signed   By: Corrie Mckusick D.O.   On: 11/22/2019 12:25   DG Chest Portable 1 View  Result Date: 11/18/2019 CLINICAL DATA:  Status post fall today. EXAM: PORTABLE CHEST 1 VIEW COMPARISON:  None. FINDINGS: The lungs are clear. Heart size is upper normal. No pneumothorax or pleural effusion. No acute or focal bony abnormality. IMPRESSION: No acute disease. Electronically Signed   By: Inge Rise M.D.   On: 11/18/2019 10:12   DG Knee Complete 4 Views Left  Result Date: 11/18/2019 CLINICAL DATA:  Status post fall. Right knee pain. Initial encounter. EXAM: LEFT KNEE - COMPLETE 4+ VIEW COMPARISON:  None. FINDINGS: There is no acute bony or joint abnormality. Severe tricompartmental osteoarthritis noted. No joint effusion. IMPRESSION: No  acute finding. Severe tricompartmental osteoarthritis. Electronically Signed   By: Inge Rise M.D.   On: 11/18/2019 10:15   DG OR UROLOGY CYSTO IMAGE ([facility] ONLY)  Result Date: 11/20/2019 There is no interpretation for this exam.  This order is for images obtained during a surgical procedure.  Please See "Surgeries" Tab for more information regarding the procedure.   ECHOCARDIOGRAM COMPLETE  Result Date: 11/19/2019    ECHOCARDIOGRAM REPORT   Patient Name:   [facility] Date of Exam: 11/19/2019 Medical Rec #:  [id number]        Height:       65.0 in Accession #:    [id number]  Weight:       292.8 lb Date of Birth:  10-31-1950        BSA:          2.326 m Patient Age:    57 years         BP:           110/72 mmHg Patient Gender: F                HR:           89 bpm. Exam Location:  [facility] Procedure: 2D Echo, Cardiac Doppler and Color Doppler Indications:     Abnormal ECG 794.31  History:         Patient has prior history of Echocardiogram examinations, most                  recent 03/23/2016. Stroke; Risk Factors:Hypertension and                  Diabetes.  Sonographer:     Sherrie Sport RDCS (AE) Referring Phys:  [id number] [facility] AMIN Diagnosing Phys: Harrell Gave End MD  Sonographer Comments: Technically challenging study due to limited acoustic windows, no apical window and no subcostal window. IMPRESSIONS  1. Left ventricular ejection fraction, by estimation, is >55%. The left ventricle has normal function. Left ventricular endocardial border not optimally defined to evaluate regional wall motion. There is severe left ventricular hypertrophy. Left ventricular diastolic function could not be evaluated.  2. Right ventricular systolic function is normal. The right ventricular size is not well visualized. Tricuspid regurgitation signal is inadequate for assessing PA pressure.  3. The mitral valve is grossly normal. Trivial mitral valve regurgitation.  4. The aortic valve is grossly normal. Aortic valve  regurgitation not well assessed. FINDINGS  Left Ventricle: Left ventricular ejection fraction, by estimation, is >55%. The left ventricle has normal function. Left ventricular endocardial border not optimally defined to evaluate regional wall motion. The left ventricular internal cavity size was  normal in size. There is severe left ventricular hypertrophy. Left ventricular diastolic function could not be evaluated. Right Ventricle: The right ventricular size is not well visualized. No increase in right ventricular wall thickness. Right ventricular systolic function is normal. Tricuspid regurgitation signal is inadequate for assessing PA pressure. Left Atrium: Left atrial size was not well visualized. Right Atrium: Right atrial size was not well visualized. Pericardium: The pericardium was not well visualized. Mitral Valve: The mitral valve is grossly normal. Mild mitral annular calcification. Trivial mitral valve regurgitation. Tricuspid Valve: The tricuspid valve is grossly normal. Tricuspid valve regurgitation is trivial. Aortic Valve: The aortic valve is grossly normal. Aortic valve regurgitation not well assessed. Pulmonic Valve: The pulmonic valve was not well visualized. Pulmonic valve regurgitation is not visualized. No evidence of pulmonic stenosis. Aorta: The aortic root is normal in size and structure. Pulmonary Artery: The pulmonary artery is of normal size. IAS/Shunts: The interatrial septum was not well visualized.  LEFT VENTRICLE PLAX 2D LVIDd:         4.60 cm LVIDs:         2.97 cm LV PW:         1.89 cm LV IVS:        2.25 cm LVOT diam:     2.00 cm LVOT Area:     3.14 cm  LEFT ATRIUM         Index LA diam:    5.70 cm 2.45 cm/m  PULMONIC VALVE AORTA                 PV Vmax:        0.72 m/s Ao Root diam: 3.50 cm PV Peak grad:   2.1 mmHg                       RVOT Peak grad: 2 mmHg   SHUNTS Systemic Diam: 2.00 cm Nelva Bush MD Electronically signed by Nelva Bush MD  Signature Date/Time: 11/19/2019/6:33:33 PM    Final    CT HEMATURIA WORKUP  Addendum Date: 11/20/2019   ADDENDUM REPORT: 11/20/2019 12:05 ADDENDUM: The submitted images are without intravenous contrast. In addition to the above findings, there is an oval, heterogeneous, faintly visualized mass in the inferior aspect of the medial segment of the left lobe of the liver, adjacent to the gallbladder fossa, measuring 4.3 x 3.6 cm in maximum dimensions on image number 31 series 4. This is concerning for a liver metastasis. These results will be called to the ordering clinician or representative by the Radiologist Assistant, and communication documented in the PACS or [employer]. Electronically Signed   By: Claudie Revering M.D.   On: 11/20/2019 12:05   Result Date: 11/20/2019 CLINICAL DATA:  Hematuria.  Weakness. EXAM: CT ABDOMEN AND PELVIS WITHOUT AND WITH CONTRAST TECHNIQUE: Multidetector CT imaging of the abdomen and pelvis was performed following the standard protocol before and following the bolus administration of intravenous contrast. CONTRAST:  160m OMNIPAQUE IOHEXOL 300 MG/ML  SOLN COMPARISON:  Pelvic ultrasound obtained yesterday. FINDINGS: Lower chest: Enlarged heart. Minimal pericardial effusion with a maximum thickness of 11 mm. Minimal bibasilar atelectasis or scarring. Hepatobiliary: 2 tiny gallstones in the gallbladder and ill-defined soft tissue density in the dependent portion of the gallbladder. No gallbladder wall thickening or pericholecystic fluid. Unremarkable liver. Pancreas: Unremarkable. No pancreatic ductal dilatation or surrounding inflammatory changes. Spleen: Normal in size without focal abnormality. Adrenals/Urinary Tract: Rounded fat density left adrenal mass measuring 2.0 cm on image number 23 series 2. Normal appearing right adrenal gland. Mild-to-moderate dilatation of the left renal collecting system and ureter to the level of the ureterovesical junction. At that location, there  is an irregular, rounded mass in the bladder measuring 3.7 x 3.7 cm on image number 78 series 2 with a small anterior calcification. This mass measures 4.0 cm in length on sagittal image number 75. To the right of midline in the dependent portion of the bladder, there is a 2nd mass measures 3.1 x 1.8 x 1.7 cm on sagittal image number 67 and axial image number 78 series 2. 3 mm right renal calculus and 1.4 cm rounded, medium density mass arising from the lateral aspect of the midportion of the right kidney, measured on image number 28 series 2. Normal appearing right ureter. Stomach/Bowel: Stomach is within normal limits. Appendix appears normal. No evidence of bowel wall thickening, distention, or inflammatory changes. Vascular/Lymphatic: Atheromatous arterial calcifications without aneurysm. No enlarged lymph nodes. Reproductive: Heterogeneous endometrial mass measuring 4.5 x 4.0 cm on image number 60 series 2 and 6.3 cm in length on sagittal image number 80. No adnexal mass seen. Other: Small amount of subcutaneous air anteriorly on the right, compatible with recent subcutaneous injection. There is also bilateral subcutaneous edema at the level of the lower abdomen, greater on the left. Musculoskeletal: Lumbar and lower thoracic spine degenerative changes. IMPRESSION: 1. 4.0 x 3.7 x 3.7 cm irregular, rounded mass in the bladder on the  left, compatible with a primary bladder carcinoma. This is obstructing the distal left ureter causing mild to moderate left hydronephrosis and hydroureter. 2. 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 3. 3 mm nonobstructing right renal calculus and 1.4 cm right renal mass or proteinaceous cyst. 4. 6.3 x 4.5 x 4.0 cm heterogeneous endometrial mass. 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 5. Calcified cholelithiasis and possible noncalcified cholelithiasis or sludge in the gallbladder 6. Minimal  pericardial effusion. 7. Cardiomegaly. 8. 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 Electronically Signed: By: Claudie Revering M.D. On: 11/19/2019 14:21     Assessment and plan- Patient is a 69 y.o. female with newly diagnosed superficial bladder cancer, endometrial mass, liver metastases here to discuss further management  1.  I have reviewed PET/CT scan images independently and discussed findings with the patient. PET CT scan shows anterior mediastinal mass 4.3 x 3.1 cm in size but was not hypermetabolic.  4.9 cm hypermetabolic liver metastases as well as porta hepatis adenopathy and retroperitoneal adenopathy was noted which was hypermetabolic.  Hypermetabolism noted in the endometrial soft tissue.  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  2.  However liver biopsy was not conclusive for GYN primary.  CA-125 is normal.  Patient was seen by Dr. Rogue Bussing in my absence and initial plan was to start her on carbotaxol chemotherapy.  However patient did not want to get her port placed and she is still unsure as to which way she would like to go.  PD-L1 was 0% and NGS testing showed no actionable mutations.  She has an appointment with GYN oncology next week.  She was seen by GYN as an outpatient but endometrial biopsy could not be performed.  It remains to be seen in GYN oncology will consider endometrial biopsy which may require anesthesia.  3.  Clinically it would be reasonable to treat this patient has metastatic endometrial cancer given the clinical presentation and PET CT scan appearance.  Carbotaxol chemotherapy would cover GYN as well as lung but would not have much coverage against a pancreaticobiliary malignancy.  I would prefer to do weekly carbotaxol in this patient with borderline performance status and morbid obesity if she is agreeable.  Patient would like to think about all of this and get back to me in 2 weeks time  4.  No blood work today and I will see her in 2 weeks and if she  is agreeable to start treatment I will check additional tumor markers including CEA and CA 19-9.  Patient was noted to have anterior mediastinal mass of 4.3 cm with did not show any significant hypermetabolism on PET scan. I do not want to subject the patient to do another EBUS biopsy for this.  I am inclined to monitor this conservatively as it could be a pericardial rub bronchogenic cyst per radiology assessment   Visit Diagnosis 1. Liver metastases ([place])   2. 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 Carcinoma of unknown primary ([place])   4. Endometrial mass      Dr. Randa Evens, MD, MPH [place] [facility] [phone] 12/15/2019 12:41 PM

## 2019-12-19 ENCOUNTER — Telehealth: Payer: Self-pay

## 2019-12-19 NOTE — Telephone Encounter (Signed)
Incoming call on triage line from pt who states that she has been released home from [employer] and is now home however before being released home they replaced her foley catheter which had been removed. Pt c/o pain with this catheter that she did not have with her previous catheter. She states she feels very irritated and has pain, she would like [place] to ensure catheter was placed properly and there are no issues. Pt schedule in next available slot. Pt voiced understanding.

## 2019-12-20 ENCOUNTER — Inpatient Hospital Stay (HOSPITAL_BASED_OUTPATIENT_CLINIC_OR_DEPARTMENT_OTHER): Payer: Medicare Other | Admitting: Obstetrics and Gynecology

## 2019-12-20 ENCOUNTER — Other Ambulatory Visit: Payer: Self-pay

## 2019-12-20 VITALS — BP 125/71 | HR 45 | Temp 97.8°F | Resp 20 | Wt 266.0 lb

## 2019-12-20 DIAGNOSIS — C787 Secondary malignant neoplasm of liver and intrahepatic bile duct: Secondary | ICD-10-CM

## 2019-12-20 DIAGNOSIS — I482 Chronic atrial fibrillation, unspecified: Secondary | ICD-10-CM | POA: Diagnosis not present

## 2019-12-20 DIAGNOSIS — T83511A Infection and inflammatory reaction due to indwelling urethral catheter, initial encounter: Secondary | ICD-10-CM | POA: Diagnosis not present

## 2019-12-20 DIAGNOSIS — C541 Malignant neoplasm of endometrium: Secondary | ICD-10-CM | POA: Insufficient documentation

## 2019-12-20 NOTE — Progress Notes (Addendum)
Gynecologic Oncology Consult Visit   Referring Provider: Dr. Janese Banks  Chief Complaint: Metastatic endometrial cancer  Subjective:  Brittney Shepard is a 69 y.o. female who is seen in consultation from Dr. 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   Initially presented to clinic for a fib and subsequent fall. Incidentally noted to have pelvic mass which prompted MRI.  11/20/19- MRI Pelvis & Abdomen 1. Hepatic mass suspicious for metastatic disease. Given other findings outlined below biopsy may be helpful to determine source of metastasis. 2. Endometrial mass with invasive features. Study limited by motion on the current evaluation but imaging suggests myometrial invasion to at least 50% along the anterior uterus. Given above findings depending on results of additional workup; if depth of myometrial invasion will change patient management a repeat evaluation may be helpful in the outpatient setting. 3. Celiac and portacaval nodal enlargement, suspicious for nodal metastases. 4. Assessment of the urinary bladder is limited by under distension large tumor has been resected.  She underwent resection of bladder mass.  11/20/19 DIAGNOSIS:  A. 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  - NO MUSCULARIS PROPRIA IDENTIFIED.   B. 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  - MUSCULARIS PROPRIA PRESENT AND UNINVOLVED.   7/20- CT Chest WO Contrast Lobulated anterior mediastinal 4.1 x 3.6 cm soft tissue mass, indeterminate for nodal metastasis vs primary thymic neoplasms. No additional findings of metastatic disease in chest. Exophytic isodense 1.3 cm renal cortical lesion in lateral right upper kidney. Cardiomegaly. Left adrenal myelolipoma.   11/22/19 A. LIVER;  ULTRASOUND-GUIDED CORE NEEDLE BIOPSY:  - POSITIVE FOR MALIGNANCY.  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   A. LIVER; ULTRASOUND-GUIDED CORE NEEDLE BIOPSY:  - POSITIVE FOR MALIGNANCY.  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   Comment:  Immunohistochemical studies show tumor cells to be positive for CK7 and CK19, with focal faint expression of CD20. Tumor cells are negative for  CDX-2, GATA-3, TTF-1, and Pax-8.  Pap smear obtained on 12/31/19 (manually guided as patient unable to tolerate speculum exam d/t cervical stenosis reported NILM, HPV negative. Dr. Georgianne Fick attempted endometrial biopsy on 11/27/19 but was unable to obtain due to anatomic challenges and stricture. She was referred to gyn onc for further evaluation and consideration of operative hysteroscopy D&C and biopsies. She was seen by Dr. Rogue Bussing on 12/04/19. He discussed incurable/stage IV disease.   bHCG < 1 AFP 1.1 LDH 152 CA 125- 23.3  12/12/19 - PET IMPRESSION: 1. Hypermetabolic endometrial mass, consistent with carcinoma. 2. Metastatic disease to the liver and upper abdominal node or nodes as detailed above. Given the distribution of metastasis, and the extent of endometrial hypermetabolism, endometrial primary is favored. 3. No thoracic hypermetabolic metastasis. The macrolobulated anterior mediastinal mass is not significantly hypermetabolic. Favor pericardial or bronchogenic cyst. Thymoma or less likely thymic carcinoma cannot be entirely excluded but are felt less likely. Given comorbidities, of questionable clinical significance. Consider chest CT follow-up at 3 months.  Foundation One CDx on 12/14/19 was negative for reportable alterations. MS-Stable, 3 Muts/Mb.  PDL1 IHC 0%  Current resides at [facility], history of CHF, morbid obesity, not on anticoagulation, history of stroke, COPD, not on home oxygen, diabetes.   Problem List: Patient Active Problem List   Diagnosis Date Noted  . Goals of care,  counseling/discussion 12/04/2019  . 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 Weakness   .  Liver metastases ([place])   . Essential hypertension   . Type 2 diabetes mellitus without complication, without long-term current use of insulin ([place])   . Morbid obesity with BMI of 45.0-49.9, adult ([place])   . Atrial fibrillation ([place]) 11/19/2019  . Hematuria, gross   . Postmenopausal bleeding   . Abnormal endometrial ultrasound   . Atrial fibrillation with RVR ([place]) 11/18/2019  . Bladder mass   . Fall   . Acute cystitis without hematuria   . Chronic diastolic CHF (congestive heart failure) ([place]) 05/27/2016  . Primary osteoarthritis of both knees 05/27/2016  . Progressive anemia 05/27/2016  . DNR (do not resuscitate)   . Palliative care by specialist   . Acute respiratory failure with hypoxia and hypercapnia ([place])   . Atrial fibrillation with rapid ventricular response ([place]) 03/20/2016  . Chronic obstructive pulmonary disease with acute exacerbation [facility])     Past Medical History: Past Medical History:  Diagnosis Date  . A-fib ([place])   . Bladder cancer ([place])   . CHF (congestive heart failure) ([place])   . Chronic pain   . Cognitive communication deficit   . Diabetes mellitus without complication ([place])   . Hypertension   . Hypokalemia   . Muscle weakness (generalized)   . 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 Secondary malignant neoplasm of liver ([place])   . Stroke [facility]) 2013  . Urinary tract infection, site not specified     Past Surgical History: Past Surgical History:  Procedure Laterality Date  . CYSTOSCOPY W/ RETROGRADES N/A 11/20/2019   Procedure: CYSTOSCOPY WITH RETROGRADE PYELOGRAM;  Surgeon: Billey Co, MD;  Location: [facility] ORS;  Service: Urology;  Laterality: N/A;  . CYSTOSCOPY WITH STENT PLACEMENT Left 11/20/2019   Procedure: CYSTOSCOPY WITH STENT PLACEMENT;  Surgeon: Billey Co, MD;  Location: [facility] ORS;  Service: Urology;  Laterality: Left;  . TRANSURETHRAL  RESECTION OF BLADDER TUMOR N/A 11/20/2019   Procedure: TRANSURETHRAL RESECTION OF BLADDER TUMOR (TURBT);  Surgeon: Billey Co, MD;  Location: [facility] ORS;  Service: Urology;  Laterality: N/A;    Family History: Family History  Problem Relation Age of Onset  . Ovarian cancer Mother   . Cancer Sister     Social History: Social History   Socioeconomic History  . Marital status: Widowed    Spouse name: Not on file  . Number of children: Not on file  . Years of education: Not on file  . Highest education level: Not on file  Occupational History  . Not on file  Tobacco Use  . Smoking status: Never Smoker  . Smokeless tobacco: Never Used  Substance and Sexual Activity  . Alcohol use: No  . Drug use: No  . Sexual activity: Not Currently  Other Topics Concern  . Not on file  Social History Narrative   Retd. 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 Has one son.    Social Determinants of Health   Financial Resource Strain:   . Difficulty of Paying Living Expenses:   Food Insecurity:   . Worried About Charity fundraiser in the Last Year:   . Arboriculturist in the Last Year:   Transportation Needs:   . Film/video editor (Medical):   Marland Kitchen Lack of Transportation (Non-Medical):   Physical Activity:   . Days of Exercise per Week:   . Minutes of Exercise per Session:   Stress:   . Feeling of Stress :  Social Connections:   . Frequency of Communication with Friends and Family:   . Frequency of Social Gatherings with Friends and Family:   . Attends Religious Services:   . Active Member of Clubs or Organizations:   . Attends Archivist Meetings:   Marland Kitchen Marital Status:   Intimate Partner Violence:   . Fear of Current or Ex-Partner:   . Emotionally Abused:   Marland Kitchen Physically Abused:   . Sexually Abused:    Allergies: Allergies  Allergen Reactions  . Penicillin G Hives  . Sulfa Antibiotics Rash    Current Medications: Current Outpatient  Medications  Medication Sig Dispense Refill  . acetaminophen (TYLENOL) 325 MG tablet Take 2 tablets (650 mg total) by mouth every 6 (six) hours as needed for mild pain (or Fever >/= 101). 60 tablet 0  . albuterol (VENTOLIN HFA) 108 (90 Base) MCG/ACT inhaler Inhale 2 puffs into the lungs every 6 (six) hours as needed for wheezing or shortness of breath.    Marland Kitchen atenolol (TENORMIN) 50 MG tablet Take 0.5 tablets (25 mg total) by mouth daily. 15 tablet 0  . barrier cream (NON-SPECIFIED) CREA Apply 1 application topically 2 (two) times daily as needed.    . diltiazem (CARDIZEM SR) 60 MG 12 hr capsule Take 1 capsule (60 mg total) by mouth every 12 (twelve) hours. 60 capsule 0  . furosemide (LASIX) 40 MG tablet Take 1 tablet (40 mg total) by mouth 2 (two) times daily. 30 tablet 0  . metFORMIN (GLUCOPHAGE) 500 MG tablet Take 500 mg by mouth 2 (two) times daily with a meal.     . Multiple Vitamin (MULTIVITAMIN) tablet Take 1 tablet by mouth daily.    . polyethylene glycol (MIRALAX / GLYCOLAX) 17 g packet Take 17 g by mouth daily as needed for mild constipation. 14 each 0  . senna-docusate (SENOKOT-S) 8.6-50 MG tablet Take 1 tablet by mouth 2 (two) times daily. 60 tablet 0  . aspirin EC 81 MG tablet Take 1 tablet (81 mg total) by mouth daily. Swallow whole. (Patient not taking: Reported on 12/14/2019) 90 tablet 3  . ipratropium-albuterol (DUONEB) 0.5-2.5 (3) MG/3ML SOLN Take 3 mLs by nebulization 3 (three) times daily. (Patient not taking: Reported on 12/14/2019) 360 mL 0  . nystatin (NYSTATIN) powder Apply 1 application topically 2 (two) times daily. (Patient not taking: Reported on 12/14/2019)    . potassium chloride (KLOR-CON) 10 MEQ tablet Take 10 mEq by mouth 2 (two) times daily. (Patient not taking: Reported on 12/14/2019)     No current facility-administered medications for this visit.    General: negative for fevers, changes in weight or night sweats Skin: negative for changes in moles or sores or  rash Eyes: negative for changes in vision HEENT: negative for change in hearing, tinnitus, voice changes Pulmonary: positive for shortness of breath Cardiac: negative for palpitations, pain Gastrointestinal: negative for nausea, vomiting, constipation, diarrhea, hematemesis, hematochezia Genitourinary/Sexual: positive for dysuria and hematuria Ob/Gyn:  negative for abnormal bleeding, or pain Musculoskeletal: negative for pain, joint pain, back pain Hematology: negative for easy bruising, abnormal bleeding Neurologic/Psych: negative for headaches, seizures, paralysis, numbness. Positive for weakness  Objective:  Physical Examination:  BP 125/71   Pulse (!) 45   Temp 97.8 F (36.6 C)   Resp 20   Wt 266 lb (120.7 kg)   SpO2 99%   BMI 44.26 kg/m     ECOG Performance Status: 2 - Symptomatic, <50% confined to bed  GENERAL: Patient is a  chronically ill appearing female in no acute distress HEENT:  PERRL, neck supple with midline trachea. Thyroid without masses.  NODES:  No cervical, supraclavicular, axillary, or inguinal lymphadenopathy palpated.  LUNGS:  Clear to auscultation bilaterally.  No wheezes or rhonchi. HEART:  Regular rate and rhythm. No murmur appreciated. ABDOMEN:  Soft, nontender.  Positive, normoactive bowel sounds.  MSK:  No focal spinal tenderness to palpation. Full range of motion bilaterally in the upper extremities. EXTREMITIES:  No peripheral edema.   SKIN:  Clear with no obvious rashes or skin changes. No nail dyscrasia. NEURO:  Nonfocal. Well oriented.  Appropriate affect.  Pelvic: EGBUS: no lesions Cervix: no lesions, nontender, mobile Vagina: no lesions, no discharge or bleeding Uterus: normal size, nontender, mobile Adnexa: no palpable masses Rectovaginal: confirmatory  Lab Review Labs on site today: CBC Latest Ref Rng & Units 12/04/2019 11/23/2019 11/19/2019  WBC 4.0 - 10.5 K/uL 9.1 9.0 7.9  Hemoglobin 12.0 - 15.0 g/dL 12.6 11.1(L) 10.8(L)  Hematocrit  36 - 46 % 39.7 33.6(L) 34.8(L)  Platelets 150 - 400 K/uL 286 299 256     Chemistry      Component Value Date/Time   NA 134 (L) 12/04/2019 1158   K 3.6 12/04/2019 1158   CL 94 (L) 12/04/2019 1158   CO2 28 12/04/2019 1158   BUN 23 12/04/2019 1158   CREATININE 0.97 12/04/2019 1158      Component Value Date/Time   CALCIUM 8.0 (L) 12/04/2019 1158   ALKPHOS 87 12/04/2019 1158   AST 24 12/04/2019 1158   ALT 16 12/04/2019 1158   BILITOT 1.0 12/04/2019 1158        Assessment:  Brittney Shepard is a 69 y.o. female diagnosed with probable endometrial cancer metastatic to the liver and retroperitoneal nodes. Although it is not possible to do an endometrial biopsy due to anatomic obstacles.  PET scan highly convincing for large uterine cancer and liver biopsy confirms metastatic disease. Cancer is MSS and PDL1 IHC 0%.  Papillary urothelia cancer resected 7/21 and still has indwelling foley catheter.   Medical co-morbidities complicating care: Diabetes, CHF, A fib, COPD, stroke, morbid obesity.  Plan:   Problem List Items Addressed This Visit      Digestive   Liver metastases [facility]) - Primary     Genitourinary   Endometrial cancer ([place])     Do not think a hysteroscopy D&C are needed to confirm diagnosis of endometrial cancer.  And surgery and radiation are not appropriate therapy at present, since she has liver metastases.  We discussed options for management including primary chemotherapy with carboplatin/taxol.  That said, she has multiple medical comorbidities that could limit her ability to tolerate this treatment.  One could start with weekly treatment in an attempt to ameliorate side effects.  A CT scan could be repeated after three cycles to assess response. If she has a major response, treatment could be continued and radiation or surgery could be possible in the future, though doubt this will come to pass in view of her poor PS. I discussed with the patient and son that stage IV  endometrial cancer is generally not curable.  The patient's diagnosis, an outline of the further diagnostic and laboratory studies which will be required and alternatives were discussed with her and her accompanying family members (son).  All questions were answered to their satisfaction.  A total of 60 minutes were spent with the patient/family today; 60% was spent in education, counseling and coordination of care for  metastatic endometrial cancer.    Verlon Au, NP  I personally interviewed and examined the patient. Agreed with the above/below plan of care. I have directly contributed to assessment and plan of care of this patient and educated and discussed with patient and family.  Mellody Drown, MD     CC:  Marguerita Merles, [address] [place],  [place] [address] [phone]

## 2019-12-20 NOTE — Progress Notes (Deleted)
12/21/2019 12:28 PM   Kerrin Mo 1950/05/22 [id number]  Referring provider: Marguerita Merles, [address] [place],  [place] [address]  No chief complaint on file.   HPI: Brittney Shepard is a 69 y.o. female with stage IV cancer currently under palliative care at [facility] who is having pain and discomfort with her catheter.       PMH: Past Medical History:  Diagnosis Date  . A-fib ([place])   . Bladder cancer ([place])   . CHF (congestive heart failure) ([place])   . Chronic pain   . Cognitive communication deficit   . Diabetes mellitus without complication ([place])   . Hypertension   . Hypokalemia   . Muscle weakness (generalized)   . 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 Secondary malignant neoplasm of liver ([place])   . Stroke [facility]) 2013  . Urinary tract infection, site not specified     Surgical History: Past Surgical History:  Procedure Laterality Date  . CYSTOSCOPY W/ RETROGRADES N/A 11/20/2019   Procedure: CYSTOSCOPY WITH RETROGRADE PYELOGRAM;  Surgeon: Billey Co, MD;  Location: [facility] ORS;  Service: Urology;  Laterality: N/A;  . CYSTOSCOPY WITH STENT PLACEMENT Left 11/20/2019   Procedure: CYSTOSCOPY WITH STENT PLACEMENT;  Surgeon: Billey Co, MD;  Location: [facility] ORS;  Service: Urology;  Laterality: Left;  . TRANSURETHRAL RESECTION OF BLADDER TUMOR N/A 11/20/2019   Procedure: TRANSURETHRAL RESECTION OF BLADDER TUMOR (TURBT);  Surgeon: Billey Co, MD;  Location: [facility] ORS;  Service: Urology;  Laterality: N/A;    Home Medications:  Allergies as of 12/21/2019      Reactions   Penicillin G Hives   Sulfa Antibiotics Rash      Medication List       Accurate as of December 20, 2019 12:28 PM. If you have any questions, ask your nurse or doctor.        acetaminophen 325 MG tablet Commonly known as: TYLENOL Take 2 tablets (650 mg total) by mouth every 6 (six) hours as needed for mild pain (or Fever >/= 101).   albuterol 108 (90 Base)  MCG/ACT inhaler Commonly known as: VENTOLIN HFA Inhale 2 puffs into the lungs every 6 (six) hours as needed for wheezing or shortness of breath.   aspirin EC 81 MG tablet Take 1 tablet (81 mg total) by mouth daily. Swallow whole.   atenolol 50 MG tablet Commonly known as: TENORMIN Take 0.5 tablets (25 mg total) by mouth daily.   barrier cream Crea Commonly known as: non-specified Apply 1 application topically 2 (two) times daily as needed.   diltiazem 60 MG 12 hr capsule Commonly known as: CARDIZEM SR Take 1 capsule (60 mg total) by mouth every 12 (twelve) hours.   furosemide 40 MG tablet Commonly known as: Lasix Take 1 tablet (40 mg total) by mouth 2 (two) times daily.   ipratropium-albuterol 0.5-2.5 (3) MG/3ML Soln Commonly known as: DUONEB Take 3 mLs by nebulization 3 (three) times daily.   metFORMIN 500 MG tablet Commonly known as: GLUCOPHAGE Take 500 mg by mouth 2 (two) times daily with a meal.   multivitamin tablet Take 1 tablet by mouth daily.   nystatin powder Generic drug: nystatin Apply 1 application topically 2 (two) times daily.   polyethylene glycol 17 g packet Commonly known as: MIRALAX / GLYCOLAX Take 17 g by mouth daily as needed for mild constipation.   potassium chloride 10 MEQ tablet Commonly known as: KLOR-CON Take 10 mEq  by mouth 2 (two) times daily.   senna-docusate 8.6-50 MG tablet Commonly known as: Senokot-S Take 1 tablet by mouth 2 (two) times daily.       Allergies:  Allergies  Allergen Reactions  . Penicillin G Hives  . Sulfa Antibiotics Rash    Family History: Family History  Problem Relation Age of Onset  . Ovarian cancer Mother   . Cancer Sister     Social History:  reports that she has never smoked. She has never used smokeless tobacco. She reports that she does not drink alcohol and does not use drugs.  ROS: Pertinent ROS in HPI  Physical Exam: There were no vitals taken for this visit.  Constitutional:  Well  nourished. Alert and oriented, No acute distress. HEENT: [place] AT, moist mucus membranes.  Trachea midline, no masses. Cardiovascular: No clubbing, cyanosis, or edema. Respiratory: Normal respiratory effort, no increased work of breathing. GI: Abdomen is soft, non tender, non distended, no abdominal masses. Liver and spleen not palpable.  No hernias appreciated.  Stool sample for occult testing is not indicated.   GU: No CVA tenderness.  No bladder fullness or masses.  Patient with circumcised/uncircumcised phallus. ***Foreskin easily retracted***  Urethral meatus is patent.  No penile discharge. No penile lesions or rashes. Scrotum without lesions, cysts, rashes and/or edema.  Testicles are located scrotally bilaterally. No masses are appreciated in the testicles. Left and right epididymis are normal. Rectal: Patient with  normal sphincter tone. Anus and perineum without scarring or rashes. No rectal masses are appreciated. Prostate is approximately *** grams, *** nodules are appreciated. Seminal vesicles are normal. Skin: No rashes, bruises or suspicious lesions. Lymph: No cervical or inguinal adenopathy. Neurologic: Grossly intact, no focal deficits, moving all 4 extremities. Psychiatric: Normal mood and affect.  Laboratory Data: Lab Results  Component Value Date   WBC 9.1 12/04/2019   HGB 12.6 12/04/2019   HCT 39.7 12/04/2019   MCV 85.4 12/04/2019   PLT 286 12/04/2019    Lab Results  Component Value Date   CREATININE 0.97 12/04/2019     Lab Results  Component Value Date   HGBA1C 6.2 (H) 11/18/2019    Lab Results  Component Value Date   TSH 1.372 11/18/2019       Component Value Date/Time   CHOL 120 03/22/2016 0601   HDL 29 (L) 03/22/2016 0601   CHOLHDL 4.1 03/22/2016 0601   VLDL 20 03/22/2016 0601   LDLCALC 71 03/22/2016 0601    Lab Results  Component Value Date   AST 24 12/04/2019   Lab Results  Component Value Date   ALT 16 12/04/2019    Urinalysis      Component Value Date/Time   COLORURINE AMBER (A) 11/18/2019 1533   APPEARANCEUR CLOUDY (A) 11/18/2019 1533   LABSPEC 1.019 11/18/2019 1533   PHURINE 6.0 11/18/2019 1533   GLUCOSEU NEGATIVE 11/18/2019 1533   HGBUR LARGE (A) 11/18/2019 1533   BILIRUBINUR NEGATIVE 11/18/2019 1533   KETONESUR NEGATIVE 11/18/2019 1533   PROTEINUR 100 (A) 11/18/2019 1533   NITRITE NEGATIVE 11/18/2019 1533   LEUKOCYTESUR SMALL (A) 11/18/2019 1533    I have reviewed the labs.   Pertinent Imaging: @CT @ @ultrasound @ @KUB @ I have independently reviewed the films.    Assessment & Plan:  ***  1. Foley catheter  No follow-ups on file.  These notes generated with voice recognition software. I apologize for typographical errors.  Zara Council, [place] [address]  [address]  [place], [place] [address] [phone]

## 2019-12-21 ENCOUNTER — Ambulatory Visit: Payer: Self-pay | Admitting: Urology

## 2019-12-22 ENCOUNTER — Ambulatory Visit: Payer: Self-pay | Admitting: Urology

## 2019-12-22 ENCOUNTER — Encounter: Payer: Self-pay | Admitting: Emergency Medicine

## 2019-12-22 ENCOUNTER — Other Ambulatory Visit: Payer: Self-pay

## 2019-12-22 ENCOUNTER — Inpatient Hospital Stay
Admission: EM | Admit: 2019-12-22 | Discharge: 2019-12-27 | DRG: 699 | Disposition: A | Discharge status: Home Health | Payer: Medicare Other | Attending: Internal Medicine | Admitting: Internal Medicine

## 2019-12-22 ENCOUNTER — Emergency Department: Payer: Medicare Other

## 2019-12-22 DIAGNOSIS — T502X5A Adverse effect of carbonic-anhydrase inhibitors, benzothiadiazides and other diuretics, initial encounter: Secondary | ICD-10-CM | POA: Diagnosis not present

## 2019-12-22 DIAGNOSIS — I4821 Permanent atrial fibrillation: Secondary | ICD-10-CM | POA: Diagnosis present

## 2019-12-22 DIAGNOSIS — R590 Localized enlarged lymph nodes: Secondary | ICD-10-CM | POA: Diagnosis present

## 2019-12-22 DIAGNOSIS — Z6841 Body Mass Index (BMI) 40.0 and over, adult: Secondary | ICD-10-CM

## 2019-12-22 DIAGNOSIS — I10 Essential (primary) hypertension: Secondary | ICD-10-CM | POA: Diagnosis not present

## 2019-12-22 DIAGNOSIS — R41841 Cognitive communication deficit: Secondary | ICD-10-CM | POA: Diagnosis present

## 2019-12-22 DIAGNOSIS — R222 Localized swelling, mass and lump, trunk: Secondary | ICD-10-CM | POA: Diagnosis present

## 2019-12-22 DIAGNOSIS — C787 Secondary malignant neoplasm of liver and intrahepatic bile duct: Secondary | ICD-10-CM | POA: Diagnosis present

## 2019-12-22 DIAGNOSIS — R310 Gross hematuria: Secondary | ICD-10-CM | POA: Diagnosis present

## 2019-12-22 DIAGNOSIS — Z23 Encounter for immunization: Secondary | ICD-10-CM

## 2019-12-22 DIAGNOSIS — Z7982 Long term (current) use of aspirin: Secondary | ICD-10-CM

## 2019-12-22 DIAGNOSIS — C7919 Secondary malignant neoplasm of other urinary organs: Secondary | ICD-10-CM | POA: Diagnosis present

## 2019-12-22 DIAGNOSIS — I110 Hypertensive heart disease with heart failure: Secondary | ICD-10-CM | POA: Diagnosis present

## 2019-12-22 DIAGNOSIS — D630 Anemia in neoplastic disease: Secondary | ICD-10-CM | POA: Diagnosis present

## 2019-12-22 DIAGNOSIS — B952 Enterococcus as the cause of diseases classified elsewhere: Secondary | ICD-10-CM | POA: Diagnosis present

## 2019-12-22 DIAGNOSIS — I4891 Unspecified atrial fibrillation: Secondary | ICD-10-CM | POA: Diagnosis not present

## 2019-12-22 DIAGNOSIS — T83511A Infection and inflammatory reaction due to indwelling urethral catheter, initial encounter: Principal | ICD-10-CM | POA: Diagnosis present

## 2019-12-22 DIAGNOSIS — N179 Acute kidney failure, unspecified: Secondary | ICD-10-CM | POA: Diagnosis present

## 2019-12-22 DIAGNOSIS — J449 Chronic obstructive pulmonary disease, unspecified: Secondary | ICD-10-CM | POA: Diagnosis present

## 2019-12-22 DIAGNOSIS — G4733 Obstructive sleep apnea (adult) (pediatric): Secondary | ICD-10-CM | POA: Diagnosis present

## 2019-12-22 DIAGNOSIS — Z880 Allergy status to penicillin: Secondary | ICD-10-CM

## 2019-12-22 DIAGNOSIS — E119 Type 2 diabetes mellitus without complications: Secondary | ICD-10-CM | POA: Diagnosis present

## 2019-12-22 DIAGNOSIS — Z515 Encounter for palliative care: Secondary | ICD-10-CM

## 2019-12-22 DIAGNOSIS — E876 Hypokalemia: Secondary | ICD-10-CM | POA: Diagnosis not present

## 2019-12-22 DIAGNOSIS — R001 Bradycardia, unspecified: Secondary | ICD-10-CM | POA: Diagnosis not present

## 2019-12-22 DIAGNOSIS — I482 Chronic atrial fibrillation, unspecified: Secondary | ICD-10-CM | POA: Diagnosis present

## 2019-12-22 DIAGNOSIS — I4819 Other persistent atrial fibrillation: Secondary | ICD-10-CM | POA: Diagnosis not present

## 2019-12-22 DIAGNOSIS — Z713 Dietary counseling and surveillance: Secondary | ICD-10-CM

## 2019-12-22 DIAGNOSIS — I5032 Chronic diastolic (congestive) heart failure: Secondary | ICD-10-CM | POA: Diagnosis present

## 2019-12-22 DIAGNOSIS — Z79899 Other long term (current) drug therapy: Secondary | ICD-10-CM

## 2019-12-22 DIAGNOSIS — C801 Malignant (primary) neoplasm, unspecified: Secondary | ICD-10-CM | POA: Diagnosis not present

## 2019-12-22 DIAGNOSIS — Z882 Allergy status to sulfonamides status: Secondary | ICD-10-CM

## 2019-12-22 DIAGNOSIS — Z20822 Contact with and (suspected) exposure to covid-19: Secondary | ICD-10-CM | POA: Diagnosis present

## 2019-12-22 DIAGNOSIS — A419 Sepsis, unspecified organism: Secondary | ICD-10-CM

## 2019-12-22 DIAGNOSIS — D649 Anemia, unspecified: Secondary | ICD-10-CM | POA: Diagnosis not present

## 2019-12-22 DIAGNOSIS — C679 Malignant neoplasm of bladder, unspecified: Secondary | ICD-10-CM | POA: Diagnosis present

## 2019-12-22 DIAGNOSIS — Z66 Do not resuscitate: Secondary | ICD-10-CM | POA: Diagnosis present

## 2019-12-22 DIAGNOSIS — Z960 Presence of urogenital implants: Secondary | ICD-10-CM | POA: Diagnosis present

## 2019-12-22 DIAGNOSIS — N3289 Other specified disorders of bladder: Secondary | ICD-10-CM | POA: Diagnosis present

## 2019-12-22 DIAGNOSIS — I472 Ventricular tachycardia: Secondary | ICD-10-CM | POA: Diagnosis present

## 2019-12-22 DIAGNOSIS — G8929 Other chronic pain: Secondary | ICD-10-CM | POA: Diagnosis present

## 2019-12-22 DIAGNOSIS — N390 Urinary tract infection, site not specified: Secondary | ICD-10-CM | POA: Diagnosis present

## 2019-12-22 DIAGNOSIS — Z7984 Long term (current) use of oral hypoglycemic drugs: Secondary | ICD-10-CM

## 2019-12-22 DIAGNOSIS — Z8673 Personal history of transient ischemic attack (TIA), and cerebral infarction without residual deficits: Secondary | ICD-10-CM

## 2019-12-22 LAB — CBC WITH DIFFERENTIAL/PLATELET
Abs Immature Granulocytes: 0.03 10*3/uL (ref 0.00–0.07)
Basophils Absolute: 0.1 10*3/uL (ref 0.0–0.1)
Basophils Relative: 1 %
Eosinophils Absolute: 0.5 10*3/uL (ref 0.0–0.5)
Eosinophils Relative: 5 %
HCT: 37.5 % (ref 36.0–46.0)
Hemoglobin: 11.8 g/dL — ABNORMAL LOW (ref 12.0–15.0)
Immature Granulocytes: 0 %
Lymphocytes Relative: 17 %
Lymphs Abs: 1.5 10*3/uL (ref 0.7–4.0)
MCH: 27.1 pg (ref 26.0–34.0)
MCHC: 31.5 g/dL (ref 30.0–36.0)
MCV: 86.2 fL (ref 80.0–100.0)
Monocytes Absolute: 0.6 10*3/uL (ref 0.1–1.0)
Monocytes Relative: 7 %
Neutro Abs: 6.2 10*3/uL (ref 1.7–7.7)
Neutrophils Relative %: 70 %
Platelets: 310 10*3/uL (ref 150–400)
RBC: 4.35 MIL/uL (ref 3.87–5.11)
RDW: 15.3 % (ref 11.5–15.5)
WBC: 8.8 10*3/uL (ref 4.0–10.5)
nRBC: 0 % (ref 0.0–0.2)

## 2019-12-22 LAB — PROTIME-INR
INR: 1.2 (ref 0.8–1.2)
Prothrombin Time: 14.5 seconds (ref 11.4–15.2)

## 2019-12-22 LAB — COMPREHENSIVE METABOLIC PANEL
ALT: 10 U/L (ref 0–44)
AST: 23 U/L (ref 15–41)
Albumin: 3.1 g/dL — ABNORMAL LOW (ref 3.5–5.0)
Alkaline Phosphatase: 73 U/L (ref 38–126)
Anion gap: 12 (ref 5–15)
BUN: 19 mg/dL (ref 8–23)
CO2: 29 mmol/L (ref 22–32)
Calcium: 7.4 mg/dL — ABNORMAL LOW (ref 8.9–10.3)
Chloride: 99 mmol/L (ref 98–111)
Creatinine, Ser: 1.21 mg/dL — ABNORMAL HIGH (ref 0.44–1.00)
GFR calc Af Amer: 52 mL/min — ABNORMAL LOW (ref >60)
GFR calc non Af Amer: 45 mL/min — ABNORMAL LOW (ref >60)
Glucose, Bld: 163 mg/dL — ABNORMAL HIGH (ref 70–99)
Potassium: 3.3 mmol/L — ABNORMAL LOW (ref 3.5–5.1)
Sodium: 140 mmol/L (ref 135–145)
Total Bilirubin: 1.2 mg/dL (ref 0.3–1.2)
Total Protein: 6.6 g/dL (ref 6.5–8.1)

## 2019-12-22 LAB — URINALYSIS, COMPLETE (UACMP) WITH MICROSCOPIC
Bilirubin Urine: NEGATIVE
Glucose, UA: NEGATIVE mg/dL
Ketones, ur: NEGATIVE mg/dL
Nitrite: NEGATIVE
Protein, ur: >=300 mg/dL — AB
RBC / HPF: >50 RBC/hpf — ABNORMAL HIGH (ref 0–5)
Specific Gravity, Urine: 1.018 (ref 1.005–1.030)
WBC, UA: >50 WBC/hpf — ABNORMAL HIGH (ref 0–5)
pH: 6 (ref 5.0–8.0)

## 2019-12-22 LAB — TROPONIN I (HIGH SENSITIVITY)
Troponin I (High Sensitivity): 3 ng/L (ref <18)
Troponin I (High Sensitivity): 3 ng/L (ref <18)

## 2019-12-22 LAB — LACTIC ACID, PLASMA
Lactic Acid, Venous: 2.2 mmol/L (ref 0.5–1.9)
Lactic Acid, Venous: 1.7 mmol/L (ref 0.5–1.9)

## 2019-12-22 LAB — TSH: TSH: 0.878 u[IU]/mL (ref 0.350–4.500)

## 2019-12-22 LAB — GLUCOSE, CAPILLARY
Glucose-Capillary: 89 mg/dL (ref 70–99)
Glucose-Capillary: 138 mg/dL — ABNORMAL HIGH (ref 70–99)

## 2019-12-22 LAB — MAGNESIUM: Magnesium: 1 mg/dL — ABNORMAL LOW (ref 1.7–2.4)

## 2019-12-22 LAB — SARS CORONAVIRUS 2 BY RT PCR (HOSPITAL ORDER, PERFORMED IN ~~LOC~~ HOSPITAL LAB): SARS Coronavirus 2: NEGATIVE

## 2019-12-22 LAB — APTT: aPTT: 29 seconds (ref 24–36)

## 2019-12-22 MED ORDER — SODIUM CHLORIDE 0.9 % IV SOLN
2.0000 g | Freq: Once | INTRAVENOUS | Status: AC
  Administered 2019-12-22: 2 g via INTRAVENOUS
  Filled 2019-12-22: qty 20

## 2019-12-22 MED ORDER — SODIUM CHLORIDE 0.9% FLUSH: 3.0000 mL | INTRAVENOUS | Status: DC | PRN

## 2019-12-22 MED ORDER — LACTATED RINGERS IV BOLUS (SEPSIS)
800.0000 mL | Freq: Once | INTRAVENOUS | Status: AC
  Administered 2019-12-22: 800 mL via INTRAVENOUS

## 2019-12-22 MED ORDER — SODIUM CHLORIDE 0.9 % IV SOLN
2.0000 g | INTRAVENOUS | Status: DC
  Administered 2019-12-23 – 2019-12-25 (×3): 2 g via INTRAVENOUS
  Filled 2019-12-22 (×3): qty 2

## 2019-12-22 MED ORDER — DILTIAZEM HCL 25 MG/5ML IV SOLN: 20.0000 mg | Freq: Once | INTRAVENOUS | Status: DC

## 2019-12-22 MED ORDER — POTASSIUM CHLORIDE CRYS ER 20 MEQ PO TBCR
40.0000 meq | EXTENDED_RELEASE_TABLET | Freq: Every day | ORAL | Status: AC
  Administered 2019-12-22 – 2019-12-23 (×2): 40 meq via ORAL
  Filled 2019-12-22 (×3): qty 2

## 2019-12-22 MED ORDER — SODIUM CHLORIDE 0.9% FLUSH
3.0000 mL | Freq: Two times a day (BID) | INTRAVENOUS | Status: DC
  Administered 2019-12-22 – 2019-12-27 (×9): 3 mL via INTRAVENOUS

## 2019-12-22 MED ORDER — DILTIAZEM HCL-DEXTROSE 125-5 MG/125ML-% IV SOLN (PREMIX)
5.0000 mg/h | INTRAVENOUS | Status: DC
  Administered 2019-12-22: 5 mg/h via INTRAVENOUS
  Filled 2019-12-22: qty 125

## 2019-12-22 MED ORDER — ENOXAPARIN SODIUM 40 MG/0.4ML ~~LOC~~ SOLN
40.0000 mg | Freq: Two times a day (BID) | SUBCUTANEOUS | Status: DC
  Administered 2019-12-22 – 2019-12-25 (×6): 40 mg via SUBCUTANEOUS
  Filled 2019-12-22 (×6): qty 0.4

## 2019-12-22 MED ORDER — POLYETHYLENE GLYCOL 3350 17 G PO PACK: 17.0000 g | PACK | Freq: Every day | ORAL | Status: DC | PRN

## 2019-12-22 MED ORDER — IPRATROPIUM-ALBUTEROL 0.5-2.5 (3) MG/3ML IN SOLN
3.0000 mL | Freq: Three times a day (TID) | RESPIRATORY_TRACT | Status: DC
  Administered 2019-12-22 – 2019-12-25 (×10): 3 mL via RESPIRATORY_TRACT
  Filled 2019-12-22 (×11): qty 3

## 2019-12-22 MED ORDER — POTASSIUM CHLORIDE CRYS ER 10 MEQ PO TBCR
10.0000 meq | EXTENDED_RELEASE_TABLET | Freq: Two times a day (BID) | ORAL | Status: DC
  Administered 2019-12-22 – 2019-12-25 (×5): 10 meq via ORAL
  Filled 2019-12-22 (×7): qty 1

## 2019-12-22 MED ORDER — ADULT MULTIVITAMIN W/MINERALS CH
1.0000 | ORAL_TABLET | Freq: Every day | ORAL | Status: DC
  Administered 2019-12-22 – 2019-12-27 (×6): 1 via ORAL
  Filled 2019-12-22 (×7): qty 1

## 2019-12-22 MED ORDER — SODIUM CHLORIDE 0.9 % IV SOLN
2.0000 g | Freq: Once | INTRAVENOUS | Status: AC
  Administered 2019-12-22: 2 g via INTRAVENOUS
  Filled 2019-12-22: qty 2

## 2019-12-22 MED ORDER — FUROSEMIDE 40 MG PO TABS
40.0000 mg | ORAL_TABLET | Freq: Two times a day (BID) | ORAL | Status: DC
  Administered 2019-12-22 – 2019-12-27 (×10): 40 mg via ORAL
  Filled 2019-12-22 (×10): qty 1

## 2019-12-22 MED ORDER — DILTIAZEM HCL-DEXTROSE 125-5 MG/125ML-% IV SOLN (PREMIX)
5.0000 mg/h | INTRAVENOUS | Status: DC
  Administered 2019-12-22: 15 mg/h via INTRAVENOUS

## 2019-12-22 MED ORDER — DILTIAZEM LOAD VIA INFUSION
15.0000 mg | Freq: Once | INTRAVENOUS | Status: AC
  Administered 2019-12-22: 15 mg via INTRAVENOUS
  Filled 2019-12-22: qty 15

## 2019-12-22 MED ORDER — LACTATED RINGERS IV BOLUS (SEPSIS)
1000.0000 mL | Freq: Once | INTRAVENOUS | Status: AC
  Administered 2019-12-22: 1000 mL via INTRAVENOUS

## 2019-12-22 MED ORDER — ATENOLOL 25 MG PO TABS
25.0000 mg | ORAL_TABLET | Freq: Every day | ORAL | Status: DC
  Administered 2019-12-22 – 2019-12-23 (×2): 25 mg via ORAL
  Filled 2019-12-22 (×2): qty 1

## 2019-12-22 MED ORDER — SODIUM CHLORIDE 0.9 % IV SOLN: 250.0000 mL | INTRAVENOUS | Status: DC | PRN

## 2019-12-22 MED ORDER — ACETAMINOPHEN 325 MG PO TABS: 650.0000 mg | ORAL_TABLET | Freq: Four times a day (QID) | ORAL | Status: DC | PRN

## 2019-12-22 MED ORDER — ASPIRIN EC 81 MG PO TBEC
81.0000 mg | DELAYED_RELEASE_TABLET | Freq: Every day | ORAL | Status: DC
  Administered 2019-12-22 – 2019-12-25 (×4): 81 mg via ORAL
  Filled 2019-12-22 (×4): qty 1

## 2019-12-22 MED ORDER — ONDANSETRON HCL 4 MG/2ML IJ SOLN: 4.0000 mg | Freq: Four times a day (QID) | INTRAMUSCULAR | Status: DC | PRN

## 2019-12-22 MED ORDER — INSULIN ASPART 100 UNIT/ML ~~LOC~~ SOLN
0.0000 [IU] | Freq: Three times a day (TID) | SUBCUTANEOUS | Status: DC
  Administered 2019-12-23 – 2019-12-24 (×3): 3 [IU] via SUBCUTANEOUS
  Administered 2019-12-24: 4 [IU] via SUBCUTANEOUS
  Administered 2019-12-25 (×3): 3 [IU] via SUBCUTANEOUS
  Administered 2019-12-26: 4 [IU] via SUBCUTANEOUS
  Administered 2019-12-26 – 2019-12-27 (×4): 3 [IU] via SUBCUTANEOUS
  Filled 2019-12-22 (×12): qty 1

## 2019-12-22 MED ORDER — ACETAMINOPHEN 325 MG PO TABS: 650.0000 mg | ORAL_TABLET | ORAL | Status: DC | PRN

## 2019-12-22 MED ORDER — SENNOSIDES-DOCUSATE SODIUM 8.6-50 MG PO TABS
1.0000 | ORAL_TABLET | Freq: Two times a day (BID) | ORAL | Status: DC
  Administered 2019-12-22 – 2019-12-26 (×6): 1 via ORAL
  Filled 2019-12-22 (×8): qty 1

## 2019-12-22 MED ORDER — LACTATED RINGERS IV SOLN: INTRAVENOUS | Status: AC

## 2019-12-22 MED ORDER — DILTIAZEM HCL ER 60 MG PO CP12
60.0000 mg | ORAL_CAPSULE | Freq: Two times a day (BID) | ORAL | Status: DC
  Administered 2019-12-22 – 2019-12-27 (×10): 60 mg via ORAL
  Filled 2019-12-22 (×12): qty 1

## 2019-12-22 NOTE — ED Triage Notes (Signed)
Pt presents to ED via [facility] with c/o possible sepsis and A-fib RVR. Per EMS pt with noted "junk" in her foley x 1 month, upon arrival EMS noted HR to be 160-200, 2.5 metop given PTA with repsonding hypotension, per EMS upon arrival to ED pt's BP 115/83 after 600 cc's fluid. Per EMS pt had appt with PCP that she missed today due to her son not wanting to take her, pt recently D/c from [facility] with Foley in place. Pt A&O x4 upon arrival to ED.

## 2019-12-22 NOTE — H&P (Addendum)
History and Physical    Brittney Shepard GYJ:[id number] DOB: August 29, 1950 DOA: 12/22/2019  PCP: Marguerita Merles, MD   Patient coming from: Home  I have personally briefly reviewed patient's old medical records in [place]  Chief Complaint: Palpitations  HPI: Brittney Shepard is a 69 y.o. 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 fib not on anticoagulation, history of diabetes mellitus, hypertension and an indwelling Foley catheter which was placed during her last hospitalization.  Patient was recently discharged from skilled nursing facility where she went for subacute rehab Patient presented to the ER via EMS for evaluation of rapid atrial fibrillation.  Per EMS patient was noted to have dirty urine and her Foley catheter had been in place for about a month.  Patient complains about having bladder spasms.  Per EMS she was noted to have heart rates between 160s to 200 and received 2.5 mg of IV metoprolol.  She was said to have become hypotensive after receiving the metoprolol and EMS administered 600 cc of fluid bolus.  Upon arrival to the ER her blood pressure had improved and was 115/83. Patient denies having any fever or chills, no chest pain, no abdominal pain or any changes in her bowel habits.  She complains of exertional shortness of breath as well as having the urge to void despite having a Foley catheter in place. In the ER she was still in A. fib with a rapid ventricular rate and her heart rate was between 130 and 150bpm. Labs reveal sodium 140, potassium 3.3, bicarb 29, glucose 169, BUN 19, creatinine 1.21, calcium 7.4, lactic acid 2.2 >> 1.7, white count 8.8, hemoglobin 11.8, hematocrit 37.5, platelet count 310  Chest x-ray reviewed by me shows no acute infiltrate or effusion. Twelve-lead EKG reviewed by me shows rapid A.  fib.     ED Course: Patient is a 69 year old female who was brought to the ER by EMS for evaluation of palpitations and shortness of breath. She was noted to be in rapid atrial fibrillation and received 2.5mg  of Metoprolol in the field with resulting hypotension requiring IVF resuscitation. She was noted to have dirty urine in her Foley catheter and CODE sepsis was called. Patient received IVF resuscitation as well as antibiotic therapy in the ER. She remains tachycardic with heart rate in the 150's and will be admitted to the hospital for further evaluation.  Review of Systems: As per HPI otherwise 10 point review of systems negative.    Past Medical History:  Diagnosis Date  . A-fib ([place])   . Bladder cancer ([place])   . CHF (congestive heart failure) ([place])   . Chronic pain   . Cognitive communication deficit   . Diabetes mellitus without complication ([place])   . Hypertension   . Hypokalemia   . Muscle weakness (generalized)   . 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 Secondary malignant neoplasm of liver ([place])   . Stroke [facility] 2013  . Urinary tract infection, site not specified     Past Surgical History:  Procedure Laterality Date  . CYSTOSCOPY W/ RETROGRADES N/A 11/20/2019   Procedure: CYSTOSCOPY WITH RETROGRADE PYELOGRAM;  Surgeon: Billey Co, MD;  Location: [facility] ORS;  Service: Urology;  Laterality: N/A;  . CYSTOSCOPY WITH STENT PLACEMENT Left 11/20/2019   Procedure: CYSTOSCOPY WITH STENT PLACEMENT;  Surgeon: Nickolas Madrid  C, MD;  Location: [facility] ORS;  Service: Urology;  Laterality: Left;  . TRANSURETHRAL RESECTION OF BLADDER TUMOR N/A 11/20/2019   Procedure: TRANSURETHRAL RESECTION OF BLADDER TUMOR (TURBT);  Surgeon: Billey Co, MD;  Location: [facility] ORS;  Service: Urology;  Laterality: N/A;     reports that she has never smoked. She has never used smokeless tobacco. She reports that she does not drink alcohol and does not use drugs.  Allergies  Allergen  Reactions  . Penicillin G Hives  . Sulfa Antibiotics Rash    Family History  Problem Relation Age of Onset  . Ovarian cancer Mother   . Cancer Sister      Prior to Admission medications   Medication Sig Start Date End Date Taking? Authorizing Provider  acetaminophen (TYLENOL) 325 MG tablet Take 2 tablets (650 mg total) by mouth every 6 (six) hours as needed for mild pain (or Fever >/= 101). 03/27/16   Bettey Costa, MD  albuterol (VENTOLIN HFA) 108 (90 Base) MCG/ACT inhaler Inhale 2 puffs into the lungs every 6 (six) hours as needed for wheezing or shortness of breath.    [provider]  aspirin EC 81 MG tablet Take 1 tablet (81 mg total) by mouth daily. Swallow whole. Patient not taking: Reported on 12/14/2019 12/08/19   Kate Sable, MD  atenolol (TENORMIN) 50 MG tablet Take 0.5 tablets (25 mg total) by mouth daily. 12/08/19   Kate Sable, MD  barrier cream (NON-SPECIFIED) CREA Apply 1 application topically 2 (two) times daily as needed.    [provider]  diltiazem (CARDIZEM SR) 60 MG 12 hr capsule Take 1 capsule (60 mg total) by mouth every 12 (twelve) hours. 03/27/16   Bettey Costa, MD  furosemide (LASIX) 40 MG tablet Take 1 tablet (40 mg total) by mouth 2 (two) times daily. 03/27/16   Bettey Costa, MD  ipratropium-albuterol (DUONEB) 0.5-2.5 (3) MG/3ML SOLN Take 3 mLs by nebulization 3 (three) times daily. Patient not taking: Reported on 12/14/2019 03/27/16   Bettey Costa, MD  metFORMIN (GLUCOPHAGE) 500 MG tablet Take 500 mg by mouth 2 (two) times daily with a meal.     [provider]  Multiple Vitamin (MULTIVITAMIN) tablet Take 1 tablet by mouth daily.    [provider]  nystatin (NYSTATIN) powder Apply 1 application topically 2 (two) times daily. Patient not taking: Reported on 12/14/2019    [provider]  polyethylene glycol (MIRALAX / GLYCOLAX) 17 g packet Take 17 g by mouth daily as needed for mild constipation. 11/23/19    Loletha Grayer, MD  potassium chloride (KLOR-CON) 10 MEQ tablet Take 10 mEq by mouth 2 (two) times daily. Patient not taking: Reported on 12/14/2019    [provider]  senna-docusate (SENOKOT-S) 8.6-50 MG tablet Take 1 tablet by mouth 2 (two) times daily. 03/27/16   Bettey Costa, MD    Physical Exam: Vitals:   12/22/19 1530 12/22/19 1600 12/22/19 1630 12/22/19 1700  BP: 113/74 111/86 111/76 113/77  Pulse: 88 80 65 84  Resp: (!) 21 20    Temp:      TempSrc:      SpO2: 97% 99% 95% 98%  Weight:      Height:         Vitals:   12/22/19 1530 12/22/19 1600 12/22/19 1630 12/22/19 1700  BP: 113/74 111/86 111/76 113/77  Pulse: 88 80 65 84  Resp: (!) 21 20    Temp:      TempSrc:  SpO2: 97% 99% 95% 98%  Weight:      Height:        Constitutional: NAD, alert and oriented x 3 Eyes: PERRL, lids and conjunctivae normal ENMT: Mucous membranes are moist.  Neck: normal, supple, no masses, no thyromegaly Respiratory: clear to auscultation bilaterally, no wheezing, no crackles. Normal respiratory effort. No accessory muscle use.  Cardiovascular: Irregularly Irregular, tachycardic , no murmurs / rubs / gallops. No extremity edema. 2+ pedal pulses. No carotid bruits.  Abdomen: no tenderness, no masses palpated. No hepatosplenomegaly. Bowel sounds positive. Foley catheter in place Musculoskeletal: no clubbing / cyanosis. No joint deformity upper and lower extremities.  Skin: no rashes, lesions, ulcers.  Neurologic: No gross focal neurologic deficit. Psychiatric: Normal mood and affect.   Labs on Admission: I have personally reviewed following labs and imaging studies  CBC: Recent Labs  Lab 12/22/19 1104  WBC 8.8  NEUTROABS 6.2  HGB 11.8*  HCT 37.5  MCV 86.2  PLT 384   Basic Metabolic Panel: Recent Labs  Lab 12/22/19 1104  NA 140  K 3.3*  CL 99  CO2 29  GLUCOSE 163*  BUN 19  CREATININE 1.21*  CALCIUM 7.4*   GFR: Estimated Creatinine Clearance: 55.5 mL/min  (A) (by C-G formula based on SCr of 1.21 mg/dL (H)). Liver Function Tests: Recent Labs  Lab 12/22/19 1104  AST 23  ALT 10  ALKPHOS 73  BILITOT 1.2  PROT 6.6  ALBUMIN 3.1*   No results for input(s): LIPASE, AMYLASE in the last 168 hours. No results for input(s): AMMONIA in the last 168 hours. Coagulation Profile: Recent Labs  Lab 12/22/19 1104  INR 1.2   Cardiac Enzymes: No results for input(s): CKTOTAL, CKMB, CKMBINDEX, TROPONINI in the last 168 hours. BNP (last 3 results) No results for input(s): PROBNP in the last 8760 hours. HbA1C: No results for input(s): HGBA1C in the last 72 hours. CBG: Recent Labs  Lab 12/22/19 1638  GLUCAP 89   Lipid Profile: No results for input(s): CHOL, HDL, LDLCALC, TRIG, CHOLHDL, LDLDIRECT in the last 72 hours. Thyroid Function Tests: Recent Labs    12/22/19 1640  TSH 0.878   Anemia Panel: No results for input(s): VITAMINB12, FOLATE, FERRITIN, TIBC, IRON, RETICCTPCT in the last 72 hours. Urine analysis:    Component Value Date/Time   COLORURINE AMBER (A) 12/22/2019 1151   APPEARANCEUR TURBID (A) 12/22/2019 1151   LABSPEC 1.018 12/22/2019 1151   PHURINE 6.0 12/22/2019 1151   GLUCOSEU NEGATIVE 12/22/2019 1151   HGBUR LARGE (A) 12/22/2019 1151   BILIRUBINUR NEGATIVE 12/22/2019 1151   KETONESUR NEGATIVE 12/22/2019 1151   PROTEINUR >=300 (A) 12/22/2019 1151   NITRITE NEGATIVE 12/22/2019 1151   LEUKOCYTESUR MODERATE (A) 12/22/2019 1151    Radiological Exams on Admission: DG Chest Port 1 View  Result Date: 12/22/2019 CLINICAL DATA:  69 year old female with possible sepsis. Atrial fibrillation with RVR. EXAM: PORTABLE CHEST 1 VIEW COMPARISON:  Chest CT 11/21/2019 and earlier. FINDINGS: Portable AP upright view at 1209 hours. Stable cardiomegaly and mediastinal contours. Normal lung volumes. Visualized tracheal air column is within normal limits. Chronic retrocardiac hypo ventilation appears stable. Elsewhere Allowing for portable  technique the lungs are clear. No pneumothorax. No acute osseous abnormality identified. Paucity of bowel gas in the upper abdomen. IMPRESSION: Stable cardiomegaly. No acute cardiopulmonary abnormality. Electronically Signed   By: Genevie Ann M.D.   On: 12/22/2019 12:33    EKG: Independently reviewed. 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 Atrial Fibrillation with a RVR Patient with a known history of A Fib who presents with a RVR Continue Cardizem infusion for rate control Continue oral Cardizem and Atenolol Patient has a CHADS2VASC score of 5 and ideally requires long term anticoagulation as primary prophylaxis for an acute stroke . Patient declined long term anticoagulation and is only on Aspirin 81mg  Obtain TSH levels Cardiology consult   2. 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   The patient also had a left ureteral stent placement and has a Foley catheter in place Patient has metastatic disease and is scheduled to follow up with oncology to disscuss  Chemotherapy treatment  3. UTI Patient has a chronic indwelling Foley catheter and has pyuria. Her lactic acid level was elevated but shows a downward trend Will place patient on Rocephin 1g IV daily while awaiting culture results No evidence of sepsis at this time despite having a lactic acid level of 2.2. She had no evidence of SIRS criteria except for tachycardia related to rapid A. Fib.  Systolic blood pressure was 24mmHg. She had mild leukocytosis of 11.8.  . 4.  Morbid obesity (62.56) Complicates overall prognosis and care   5. Type 2 diabetes mellitus.    Continue consistent carbohydrate  Place patient on Glycemic control with insulin   6. Chronic Diastolic CHF Continue scheduled Furosemide and Atenolol   7. Hypokalemia Secondary to diuretic therapy Supplement potassium Obtain magnesium levels      DVT prophylaxis:  Lovenox Code Status: Full code Family Communication: Greater than 50% of time was spent discussing plan of care with patient at the bedside. He verbalizes understanding and agrees with the plan Disposition Plan: Back to previous home environment Consults called: Cardiology    Collier Bullock MD [facility]     12/22/2019, 5:34 PM

## 2019-12-22 NOTE — ED Provider Notes (Signed)
[facility] Emergency Department Provider Note  ____________________________________________  Time seen: Approximately 11:13 AM  I have reviewed the triage vital signs and the nursing notes.   HISTORY  Chief Complaint Code Sepsis and Tachycardia    HPI Brittney Shepard is a 69 y.o. female with a history of bladder cancer, atrial fibrillation, CHF, diabetes, hypertension, Foley catheter present for greater than a month who comes the ED complaining of rapid heart rate, discolored Foley output, fatigue.  No falls, no fevers chills chest pain or shortness of breath.  No vomiting or diarrhea.  She does feel frequent urge to urinate despite the catheter.  EMS noted initial end-tidal CO2 of 17 and were concerning for sepsis.  They gave small dose of IV metoprolol which lowered blood pressure, and they gave 600 mL IV fluid bolus.      Past Medical History:  Diagnosis Date  . A-fib ([place])   . Bladder cancer ([place])   . CHF (congestive heart failure) ([place])   . Chronic pain   . Cognitive communication deficit   . Diabetes mellitus without complication ([place])   . Hypertension   . Hypokalemia   . Muscle weakness (generalized)   . 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 Secondary malignant neoplasm of liver ([place])   . Stroke [facility]) 2013  . Urinary tract infection, site not specified      Patient Active Problem List   Diagnosis Date Noted  . Endometrial cancer ([place]) 12/20/2019  . Goals of care, counseling/discussion 12/04/2019  . 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 Weakness   . Liver metastases ([place])   . Essential hypertension   . Type 2 diabetes mellitus without complication, without long-term current use of insulin ([place])   . Morbid obesity with BMI of 45.0-49.9, adult ([place])   . Atrial fibrillation ([place]) 11/19/2019  . Hematuria, gross   . Postmenopausal bleeding   . Abnormal endometrial ultrasound   . Atrial fibrillation with RVR ([place])  11/18/2019  . Bladder mass   . Fall   . Acute cystitis without hematuria   . Chronic diastolic CHF (congestive heart failure) ([place]) 05/27/2016  . Primary osteoarthritis of both knees 05/27/2016  . Progressive anemia 05/27/2016  . DNR (do not resuscitate)   . Palliative care by specialist   . Acute respiratory failure with hypoxia and hypercapnia ([place])   . Atrial fibrillation with rapid ventricular response ([place]) 03/20/2016  . Chronic obstructive pulmonary disease with acute exacerbation [facility])      Past Surgical History:  Procedure Laterality Date  . CYSTOSCOPY W/ RETROGRADES N/A 11/20/2019   Procedure: CYSTOSCOPY WITH RETROGRADE PYELOGRAM;  Surgeon: Billey Co, MD;  Location: [facility] ORS;  Service: Urology;  Laterality: N/A;  . CYSTOSCOPY WITH STENT PLACEMENT Left 11/20/2019   Procedure: CYSTOSCOPY WITH STENT PLACEMENT;  Surgeon: Billey Co, MD;  Location: [facility] ORS;  Service: Urology;  Laterality: Left;  . TRANSURETHRAL RESECTION OF BLADDER TUMOR N/A 11/20/2019   Procedure: TRANSURETHRAL RESECTION OF BLADDER TUMOR (TURBT);  Surgeon: Billey Co, MD;  Location: [facility] ORS;  Service: Urology;  Laterality: N/A;     Prior to Admission medications   Medication Sig Start Date End Date Taking? Authorizing Provider  acetaminophen (TYLENOL) 325 MG tablet Take 2 tablets (650 mg total) by mouth every 6 (six) hours as needed for mild pain (or Fever >/= 101). 03/27/16   Bettey Costa, MD  albuterol (VENTOLIN HFA) 108 (90 Base) MCG/ACT inhaler Inhale 2 puffs  into the lungs every 6 (six) hours as needed for wheezing or shortness of breath.    [provider]  aspirin EC 81 MG tablet Take 1 tablet (81 mg total) by mouth daily. Swallow whole. Patient not taking: Reported on 12/14/2019 12/08/19   Kate Sable, MD  atenolol (TENORMIN) 50 MG tablet Take 0.5 tablets (25 mg total) by mouth daily. 12/08/19   Kate Sable, MD  barrier cream (NON-SPECIFIED) CREA Apply 1 application  topically 2 (two) times daily as needed.    [provider]  diltiazem (CARDIZEM SR) 60 MG 12 hr capsule Take 1 capsule (60 mg total) by mouth every 12 (twelve) hours. 03/27/16   Bettey Costa, MD  furosemide (LASIX) 40 MG tablet Take 1 tablet (40 mg total) by mouth 2 (two) times daily. 03/27/16   Bettey Costa, MD  ipratropium-albuterol (DUONEB) 0.5-2.5 (3) MG/3ML SOLN Take 3 mLs by nebulization 3 (three) times daily. Patient not taking: Reported on 12/14/2019 03/27/16   Bettey Costa, MD  metFORMIN (GLUCOPHAGE) 500 MG tablet Take 500 mg by mouth 2 (two) times daily with a meal.     [provider]  Multiple Vitamin (MULTIVITAMIN) tablet Take 1 tablet by mouth daily.    [provider]  nystatin (NYSTATIN) powder Apply 1 application topically 2 (two) times daily. Patient not taking: Reported on 12/14/2019    [provider]  polyethylene glycol (MIRALAX / GLYCOLAX) 17 g packet Take 17 g by mouth daily as needed for mild constipation. 11/23/19   Loletha Grayer, MD  potassium chloride (KLOR-CON) 10 MEQ tablet Take 10 mEq by mouth 2 (two) times daily. Patient not taking: Reported on 12/14/2019    [provider]  senna-docusate (SENOKOT-S) 8.6-50 MG tablet Take 1 tablet by mouth 2 (two) times daily. 03/27/16   Bettey Costa, MD     Allergies Penicillin g and Sulfa antibiotics   Family History  Problem Relation Age of Onset  . Ovarian cancer Mother   . Cancer Sister     Social History Social History   Tobacco Use  . Smoking status: Never Smoker  . Smokeless tobacco: Never Used  Substance Use Topics  . Alcohol use: No  . Drug use: No    Review of Systems  Constitutional:   No fever or chills.  ENT:   No sore throat. No rhinorrhea. Cardiovascular:   No chest pain or syncope. Respiratory:   No dyspnea or cough. Gastrointestinal:   Positive for low abdominal pain without vomiting and diarrhea.  Musculoskeletal:   Negative for focal pain or  swelling All other systems reviewed and are negative except as documented above in ROS and HPI.  ____________________________________________   PHYSICAL EXAM:  VITAL SIGNS: ED Triage Vitals  Enc Vitals Group     BP 12/22/19 1105 (!) 89/53     Pulse Rate 12/22/19 1105 100     Resp 12/22/19 1105 20     Temp 12/22/19 1105 98.2 F (36.8 C)     Temp Source 12/22/19 1105 Oral     SpO2 12/22/19 1105 100 %     Weight 12/22/19 1108 266 lb (120.7 kg)     Height 12/22/19 1108 5\' 5"  (1.651 m)     Head Circumference --      Peak Flow --      Pain Score 12/22/19 1105 5     Pain Loc --      Pain Edu? --      Excl. in [place]? --  Vital signs reviewed, nursing assessments reviewed.   Constitutional:   Alert and oriented. Non-toxic appearance. Eyes:   Conjunctivae are normal. EOMI. PERRL. ENT      Head:   Normocephalic and atraumatic.      Nose:   Normal.      Mouth/Throat:   Dry mucous membranes      Neck:   No meningismus. Full ROM. Hematological/Lymphatic/Immunilogical:   No cervical lymphadenopathy. Cardiovascular:   Irregularly irregular rhythm, heart rate 140. Symmetric bilateral radial and DP pulses.  No murmurs. Cap refill less than 2 seconds. Respiratory:   Normal respiratory effort without tachypnea/retractions. Breath sounds are clear and equal bilaterally. No wheezes/rales/rhonchi. Gastrointestinal:   Soft with diffuse lower abdominal tenderness. Non distended. There is no CVA tenderness.  No rebound, rigidity, or guarding.  Musculoskeletal:   Normal range of motion in all extremities. No joint effusions.  No lower extremity tenderness.  No edema. Neurologic:   Normal speech and language.  Motor grossly intact. No acute focal neurologic deficits are appreciated.  Skin:    Skin is warm, dry and intact. No rash noted.  No petechiae, purpura, or bullae.  ____________________________________________    LABS (pertinent positives/negatives) (all labs ordered are listed, but  only abnormal results are displayed) Labs Reviewed  LACTIC ACID, PLASMA - Abnormal; Notable for the following components:      Result Value   Lactic Acid, Venous 2.2 (*)    All other components within normal limits  COMPREHENSIVE METABOLIC PANEL - Abnormal; Notable for the following components:   Potassium 3.3 (*)    Glucose, Bld 163 (*)    Creatinine, Ser 1.21 (*)    Calcium 7.4 (*)    Albumin 3.1 (*)    GFR calc non Af Amer 45 (*)    GFR calc Af Amer 52 (*)    All other components within normal limits  CBC WITH DIFFERENTIAL/PLATELET - Abnormal; Notable for the following components:   Hemoglobin 11.8 (*)    All other components within normal limits  URINALYSIS, COMPLETE (UACMP) WITH MICROSCOPIC - Abnormal; Notable for the following components:   Color, Urine AMBER (*)    APPearance TURBID (*)    Hgb urine dipstick LARGE (*)    Protein, ur >=300 (*)    Leukocytes,Ua MODERATE (*)    RBC / HPF >50 (*)    WBC, UA >50 (*)    Bacteria, UA RARE (*)    All other components within normal limits  SARS CORONAVIRUS 2 BY RT PCR (HOSPITAL ORDER, [facility])  CULTURE, BLOOD (ROUTINE X 2)  CULTURE, BLOOD (ROUTINE X 2)  URINE CULTURE  PROTIME-INR  APTT  LACTIC ACID, PLASMA   ____________________________________________   EKG Interpreted by me Atrial fibrillation, rate of 149, normal axis and intervals.  Poor R wave progression.  Normal ST segments and T waves, no acute ischemic changes.  ____________________________________________    RADIOLOGY  DG Chest Port 1 View  Result Date: 12/22/2019 CLINICAL DATA:  69 year old female with possible sepsis. Atrial fibrillation with RVR. EXAM: PORTABLE CHEST 1 VIEW COMPARISON:  Chest CT 11/21/2019 and earlier. FINDINGS: Portable AP upright view at 1209 hours. Stable cardiomegaly and mediastinal contours. Normal lung volumes. Visualized tracheal air column is within normal limits. Chronic retrocardiac hypo ventilation  appears stable. Elsewhere Allowing for portable technique the lungs are clear. No pneumothorax. No acute osseous abnormality identified. Paucity of bowel gas in the upper abdomen. IMPRESSION: Stable cardiomegaly. No acute cardiopulmonary abnormality. Electronically  Signed   By: Genevie Ann M.D.   On: 12/22/2019 12:33    ____________________________________________   PROCEDURES .Critical Care Performed by: Carrie Mew, MD Authorized by: Carrie Mew, MD   Critical care provider statement:    Critical care time (minutes):  35   Critical care time was exclusive of:  Separately billable procedures and treating other patients   Critical care was necessary to treat or prevent imminent or life-threatening deterioration of the following conditions:  Sepsis, shock and circulatory failure   Critical care was time spent personally by me on the following activities:  Development of treatment plan with patient or surrogate, discussions with consultants, evaluation of patient's response to treatment, examination of patient, obtaining history from patient or surrogate, ordering and performing treatments and interventions, ordering and review of laboratory studies, ordering and review of radiographic studies, pulse oximetry, re-evaluation of patient's condition and review of old charts    ____________________________________________  DIFFERENTIAL DIAGNOSIS   Catheter associated UTI, diverticulitis, appendicitis, sepsis, dehydration, electrolyte abnormality  CLINICAL IMPRESSION / ASSESSMENT AND PLAN / ED COURSE  Medications ordered in the ED: Medications  lactated ringers infusion ( Intravenous New Bag/Given 12/22/19 1146)  lactated ringers bolus 1,000 mL (0 mLs Intravenous Stopped 12/22/19 1315)    And  lactated ringers bolus 800 mL (800 mLs Intravenous New Bag/Given 12/22/19 1316)  cefTRIAXone (ROCEPHIN) 2 g in sodium chloride 0.9 % 100 mL IVPB (has no administration in time range)  diltiazem  (CARDIZEM) 1 mg/mL load via infusion 15 mg (has no administration in time range)    And  diltiazem (CARDIZEM) 125 mg in dextrose 5% 125 mL (1 mg/mL) infusion (has no administration in time range)  ceFEPIme (MAXIPIME) 2 g in sodium chloride 0.9 % 100 mL IVPB (0 g Intravenous Stopped 12/22/19 1310)    Pertinent labs & imaging results that were available during my care of the patient were reviewed by me and considered in my medical decision making (see chart for details).  Brittney Shepard was evaluated in Emergency Department on 12/22/2019 for the symptoms described in the history of present illness. She was evaluated in the context of the global COVID-19 pandemic, which necessitated consideration that the patient might be at risk for infection with the SARS-CoV-2 virus that causes COVID-19. Institutional protocols and algorithms that pertain to the evaluation of patients at risk for COVID-19 are in a state of rapid change based on information released by regulatory bodies including the CDC and federal and state organizations. These policies and algorithms were followed during the patient's care in the ED.   Patient is on diltiazem 60 mg twice daily, reports being compliant but no medications yet this morning.  Clinical Course as of Dec 21 1333  Fri Dec 22, 2019  1111 Patient presents with lower abdominal pain, murky Foley catheter output.  Will change Foley to obtain clean cath UA sample.  She had low end-tidal CO2 by EMS, A. fib RVR, hypotension on initial ED blood pressure, concerning for sepsis.  Due to BMI over 35, will use ideal body weight fluid calculation, resulting in fluid resuscitation need of 1800 mL.  She received 600 mL already so only needs 1200 mL in the ED for initial resuscitation volume.   [PS]  1310 Labs show elevated lactate of 2.2.  UA consistent with UTI.  Covid negative.   [PS]    Clinical Course User Index [PS] Carrie Mew, MD      ----------------------------------------- 1:35 PM on 12/22/2019 -----------------------------------------  Fluid bolus completed.  Still has A. fib with RVR, heart rate 130-150.  Will start diltiazem bolus and drip.  Discussed with hospitalist who is in the room to admit patient now.  ____________________________________________   FINAL CLINICAL IMPRESSION(S) / ED DIAGNOSES    Final diagnoses:  Urinary tract infection associated with indwelling urethral catheter, initial encounter ([place])  Sepsis, due to unspecified organism, unspecified whether acute organ dysfunction present [facility])  Atrial fibrillation with RVR [facility])     ED Discharge Orders    None      Portions of this note were generated with dragon dictation software. Dictation errors may occur despite best attempts at proofreading.   Carrie Mew, MD 12/22/19 [phone]

## 2019-12-22 NOTE — ED Notes (Signed)
Pt reports urinary burning sensation.

## 2019-12-22 NOTE — Consult Note (Signed)
PHARMACY -  BRIEF ANTIBIOTIC NOTE   Pharmacy has received consult(s) for  from an ED provider.  The patient's profile has been reviewed for ht/wt/allergies/indication/available labs.    One time order(s) placed for Cefepime  Further antibiotics/pharmacy consults should be ordered by admitting physician if indicated.                       Thank you,  Lu Duffel, PharmD, BCPS Clinical Pharmacist 12/22/2019 11:28 AM

## 2019-12-22 NOTE — Progress Notes (Signed)
CODE SEPSIS - PHARMACY COMMUNICATION  **Broad Spectrum Antibiotics should be administered within 1 hour of Sepsis diagnosis**  Time Code Sepsis Called/Page Received: 1111  Antibiotics Ordered: Cefepime  Time of 1st antibiotic administration: 1146  Additional action taken by pharmacy: none  If necessary, Name of Provider/Nurse Contacted: [place] ,PharmD Clinical Pharmacist  12/22/2019  11:19 AM

## 2019-12-23 DIAGNOSIS — C679 Malignant neoplasm of bladder, unspecified: Secondary | ICD-10-CM

## 2019-12-23 DIAGNOSIS — E119 Type 2 diabetes mellitus without complications: Secondary | ICD-10-CM

## 2019-12-23 DIAGNOSIS — Z6841 Body Mass Index (BMI) 40.0 and over, adult: Secondary | ICD-10-CM

## 2019-12-23 DIAGNOSIS — E876 Hypokalemia: Secondary | ICD-10-CM

## 2019-12-23 DIAGNOSIS — I10 Essential (primary) hypertension: Secondary | ICD-10-CM

## 2019-12-23 DIAGNOSIS — I4891 Unspecified atrial fibrillation: Secondary | ICD-10-CM

## 2019-12-23 DIAGNOSIS — D649 Anemia, unspecified: Secondary | ICD-10-CM

## 2019-12-23 LAB — BASIC METABOLIC PANEL
Anion gap: 14 (ref 5–15)
BUN: 16 mg/dL (ref 8–23)
CO2: 24 mmol/L (ref 22–32)
Calcium: 7.1 mg/dL — ABNORMAL LOW (ref 8.9–10.3)
Chloride: 97 mmol/L — ABNORMAL LOW (ref 98–111)
Creatinine, Ser: 1.04 mg/dL — ABNORMAL HIGH (ref 0.44–1.00)
GFR calc Af Amer: >60 mL/min (ref >60)
GFR calc non Af Amer: 54 mL/min — ABNORMAL LOW (ref >60)
Glucose, Bld: 130 mg/dL — ABNORMAL HIGH (ref 70–99)
Potassium: 3.7 mmol/L (ref 3.5–5.1)
Sodium: 135 mmol/L (ref 135–145)

## 2019-12-23 LAB — CBC
HCT: 36.1 % (ref 36.0–46.0)
Hemoglobin: 11.2 g/dL — ABNORMAL LOW (ref 12.0–15.0)
MCH: 27.6 pg (ref 26.0–34.0)
MCHC: 31 g/dL (ref 30.0–36.0)
MCV: 88.9 fL (ref 80.0–100.0)
Platelets: 265 10*3/uL (ref 150–400)
RBC: 4.06 MIL/uL (ref 3.87–5.11)
RDW: 15.7 % — ABNORMAL HIGH (ref 11.5–15.5)
WBC: 6.9 10*3/uL (ref 4.0–10.5)
nRBC: 0 % (ref 0.0–0.2)

## 2019-12-23 LAB — MAGNESIUM: Magnesium: 1.5 mg/dL — ABNORMAL LOW (ref 1.7–2.4)

## 2019-12-23 LAB — GLUCOSE, CAPILLARY
Glucose-Capillary: 129 mg/dL — ABNORMAL HIGH (ref 70–99)
Glucose-Capillary: 107 mg/dL — ABNORMAL HIGH (ref 70–99)
Glucose-Capillary: 118 mg/dL — ABNORMAL HIGH (ref 70–99)
Glucose-Capillary: 175 mg/dL — ABNORMAL HIGH (ref 70–99)
Glucose-Capillary: 152 mg/dL — ABNORMAL HIGH (ref 70–99)

## 2019-12-23 MED ORDER — MAGNESIUM SULFATE IN D5W 1-5 GM/100ML-% IV SOLN
1.0000 g | Freq: Once | INTRAVENOUS | Status: AC
  Administered 2019-12-23: 1 g via INTRAVENOUS
  Filled 2019-12-23: qty 100

## 2019-12-23 MED ORDER — CHLORHEXIDINE GLUCONATE CLOTH 2 % EX PADS
6.0000 | MEDICATED_PAD | Freq: Every day | CUTANEOUS | Status: DC
  Administered 2019-12-23 – 2019-12-27 (×5): 6 via TOPICAL

## 2019-12-23 MED ORDER — PNEUMOCOCCAL VAC POLYVALENT 25 MCG/0.5ML IJ INJ
0.5000 mL | INJECTION | INTRAMUSCULAR | Status: AC
  Administered 2019-12-27: 0.5 mL via INTRAMUSCULAR
  Filled 2019-12-23: qty 0.5

## 2019-12-23 MED ORDER — POTASSIUM CHLORIDE CRYS ER 20 MEQ PO TBCR
20.0000 meq | EXTENDED_RELEASE_TABLET | Freq: Once | ORAL | Status: AC
  Administered 2019-12-23: 20 meq via ORAL
  Filled 2019-12-23: qty 1

## 2019-12-23 MED ORDER — ATENOLOL 25 MG PO TABS
12.5000 mg | ORAL_TABLET | Freq: Every day | ORAL | Status: DC
  Administered 2019-12-24 – 2019-12-27 (×4): 12.5 mg via ORAL
  Filled 2019-12-23 (×4): qty 0.5

## 2019-12-23 MED ORDER — MAGNESIUM SULFATE 50 % IJ SOLN
1.0000 g | Freq: Once | INTRAMUSCULAR | Status: DC
  Filled 2019-12-23: qty 2

## 2019-12-23 NOTE — Consult Note (Signed)
Cardiology Consultation:   Patient ID: Brittney Shepard MRN: [id number]; DOB: 10/10/1950  Admit date: 12/22/2019 Date of Consult: 12/23/2019  Primary Care Provider: Marguerita Merles, MD Primary Cardiologist: Kate Sable, MD  Primary Electrophysiologist:  None    Patient Profile:   Brittney Shepard is a 69 y.o. female with a hx of permanent atrial fibrillation, hypertension, DM2, bladder tumor s/p TURBT 11/2019 with persistent foley catheter, and who is being seen today for the evaluation of atrial fibrillation with RVR at the request of Dr. Francine Graven.  History of Present Illness:   [name] is a 69 year old female with PMH as above and including persistent atrial fibrillation with RVR.  She was admitted to the hospital 11/18/2019 after a fall.  She was noted to be in atrial fibrillation with rapid ventricular response and rate control achieved with beta-blockers and calcium channel blockers.  Anticoagulation was discussed; however, she wanted to continue on ASA despite CHA2DS2-VASc score of at least 5.  She was diagnosed with bladder tumor and undergoing a procedure, preventing anticoagulation initiation. Bx results still pending.   Echo 11/19/2019 showed LVEF greater than 55%, severe LVH, trivial MR.  She was seen in office 12/08/2019 by Dr. Garen Lah.  Atenolol was decreased from 50 mg daily to 25 mg daily.  She was continued on ASA 81 mg per her preference.  On 12/22/2019, she presented to [facility] emergency department with report that her Foley output was discolored.  She also reports a fall with bandage on her right lower extremity.  She reported decreased energy and was noted to be in known permanent atrial fibrillation with RVR.  Initial vitals significant for hypertension with BP 89/53, atrial fibrillation with ventricular rate 100.  Labs showed lactic acid 2.2 with sepsis protocol activated, hypokalemia with potassium 3.3, hypomagnesemia with Mg 1.0, AKI with creatinine 1.21,  hypoalbuminemia with albumin 3.1, anemia with hemoglobin 11.8. TSH wnl. EKG showed atrial fibrillation with ventricular rate 149 and no acute ST or T changes.  Chest x-ray showed stable cardiomegaly and without acute findings.  Cardiology consulted.  Heart Pathway Score:     Past Medical History:  Diagnosis Date  . A-fib ([place])   . Bladder cancer ([place])   . CHF (congestive heart failure) ([place])   . Chronic pain   . Cognitive communication deficit   . Diabetes mellitus without complication ([place])   . Hypertension   . Hypokalemia   . Muscle weakness (generalized)   . 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 Secondary malignant neoplasm of liver ([place])   . Stroke [facility]) 2013  . Urinary tract infection, site not specified     Past Surgical History:  Procedure Laterality Date  . CYSTOSCOPY W/ RETROGRADES N/A 11/20/2019   Procedure: CYSTOSCOPY WITH RETROGRADE PYELOGRAM;  Surgeon: Billey Co, MD;  Location: [facility] ORS;  Service: Urology;  Laterality: N/A;  . CYSTOSCOPY WITH STENT PLACEMENT Left 11/20/2019   Procedure: CYSTOSCOPY WITH STENT PLACEMENT;  Surgeon: Billey Co, MD;  Location: [facility] ORS;  Service: Urology;  Laterality: Left;  . TRANSURETHRAL RESECTION OF BLADDER TUMOR N/A 11/20/2019   Procedure: TRANSURETHRAL RESECTION OF BLADDER TUMOR (TURBT);  Surgeon: Billey Co, MD;  Location: [facility] ORS;  Service: Urology;  Laterality: N/A;     Home Medications:  Prior to Admission medications   Medication Sig Start Date End Date Taking? Authorizing Provider  acetaminophen (TYLENOL) 325 MG tablet Take 2 tablets (650 mg total) by mouth every 6 (six) hours as needed  for mild pain (or Fever >/= 101). 03/27/16  Yes Mody, Ulice Bold, MD  atenolol (TENORMIN) 100 MG tablet Take 50 mg by mouth daily.   Yes [provider]  diltiazem (CARDIZEM SR) 60 MG 12 hr capsule Take 1 capsule (60 mg total) by mouth every 12 (twelve) hours. Patient taking differently: Take 60 mg by mouth 2  (two) times daily.  03/27/16  Yes Mody, Ulice Bold, MD  furosemide (LASIX) 40 MG tablet Take 1 tablet (40 mg total) by mouth 2 (two) times daily. 03/27/16  Yes Mody, Ulice Bold, MD  metFORMIN (GLUCOPHAGE) 500 MG tablet Take 500 mg by mouth daily with breakfast.    Yes [provider]  Multiple Vitamin (MULTIVITAMIN) tablet Take 1 tablet by mouth daily.   Yes [provider]  potassium chloride (KLOR-CON) 10 MEQ tablet Take 10 mEq by mouth 2 (two) times daily.   Yes [provider]  aspirin EC 81 MG tablet Take 81 mg by mouth daily. Swallow whole.    [provider]    Inpatient Medications: Scheduled Meds: . aspirin EC  81 mg Oral Daily  . atenolol  25 mg Oral Daily  . Chlorhexidine Gluconate Cloth  6 each Topical Daily  . diltiazem  60 mg Oral Q12H  . enoxaparin (LOVENOX) injection  40 mg Subcutaneous BID  . furosemide  40 mg Oral BID  . insulin aspart  0-20 Units Subcutaneous TID WC  . ipratropium-albuterol  3 mL Nebulization TID  . multivitamin with minerals  1 tablet Oral Daily  . [START ON 12/24/2019] pneumococcal 23 valent vaccine  0.5 mL Intramuscular Tomorrow-1000  . potassium chloride  10 mEq Oral BID  . potassium chloride  40 mEq Oral Daily  . senna-docusate  1 tablet Oral BID  . sodium chloride flush  3 mL Intravenous Q12H   Continuous Infusions: . sodium chloride    . cefTRIAXone (ROCEPHIN)  IV    . diltiazem (CARDIZEM) infusion Stopped (12/22/19 2202)   PRN Meds: sodium chloride, acetaminophen, ondansetron (ZOFRAN) IV, polyethylene glycol, sodium chloride flush  Allergies:    Allergies  Allergen Reactions  . Penicillin G Hives  . Sulfa Antibiotics Rash    Social History:   Social History   Socioeconomic History  . Marital status: Widowed    Spouse name: Not on file  . Number of children: Not on file  . Years of education: Not on file  . Highest education level: Not on file  Occupational History  . Not on file  Tobacco Use  .  Smoking status: Never Smoker  . Smokeless tobacco: Never Used  Substance and Sexual Activity  . Alcohol use: No  . Drug use: No  . Sexual activity: Not Currently  Other Topics Concern  . Not on file  Social History Narrative   Retd. 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 Has one son.    Social Determinants of Health   Financial Resource Strain:   . Difficulty of Paying Living Expenses: Not on file  Food Insecurity:   . Worried About Charity fundraiser in the Last Year: Not on file  . Ran Out of Food in the Last Year: Not on file  Transportation Needs:   . Lack of Transportation (Medical): Not on file  . Lack of Transportation (Non-Medical): Not on file  Physical Activity:   . Days of Exercise per Week: Not on file  . Minutes of Exercise per Session: Not on file  Stress:   .  Feeling of Stress : Not on file  Social Connections:   . Frequency of Communication with Friends and Family: Not on file  . Frequency of Social Gatherings with Friends and Family: Not on file  . Attends Religious Services: Not on file  . Active Member of Clubs or Organizations: Not on file  . Attends Archivist Meetings: Not on file  . Marital Status: Not on file  Intimate Partner Violence:   . Fear of Current or Ex-Partner: Not on file  . Emotionally Abused: Not on file  . Physically Abused: Not on file  . Sexually Abused: Not on file    Family History:    Family History  Problem Relation Age of Onset  . Ovarian cancer Mother   . Cancer Sister      ROS:  Please see the history of present illness.  Review of Systems  Constitutional: Positive for malaise/fatigue.  Respiratory: Negative for shortness of breath.   Cardiovascular: Negative for chest pain, palpitations and leg swelling.  Gastrointestinal:       Foley catheter discoloration  Musculoskeletal: Positive for falls.  All other systems reviewed and are negative.   All other ROS reviewed and negative.      Physical Exam/Data:   Vitals:   12/23/19 0300 12/23/19 0421 12/23/19 0445 12/23/19 0829  BP:  (!) 89/63 99/65 107/72  Pulse:  67  85  Resp: 18 20 20 19   Temp:  97.6 F (36.4 C)  98.5 F (36.9 C)  TempSrc:  Oral  Oral  SpO2:  93%  98%  Weight:  126.3 kg    Height:        Intake/Output Summary (Last 24 hours) at 12/23/2019 0911 Last data filed at 12/23/2019 0600 Gross per 24 hour  Intake 2650 ml  Output 550 ml  Net 2100 ml   Last 3 Weights 12/23/2019 12/22/2019 12/20/2019  Weight (lbs) 278 lb 7.1 oz 266 lb 266 lb  Weight (kg) 126.3 kg 120.657 kg 120.657 kg     Body mass index is 46.34 kg/m.  General:  Well nourished, well developed, in no acute distress HEENT: normal Neck: JVD difficult to assess due to body habitus Vascular: No carotid bruits; radial pulses 2+ bilaterally Cardiac:  normal S1, S2; IRIR; no murmur  Lungs:  clear to auscultation bilaterally, no wheezing, rhonchi or rales  Abd: soft, nontender, no hepatomegaly  Ext: no edema Musculoskeletal:  No deformities, BUE and BLE strength normal and equal Skin: warm and dry  Neuro:  No focal abnormalities noted Psych:  Normal affect   EKG:  The EKG was personally reviewed and demonstrates:EKG showed atrial fibrillation with ventricular rate 149 and no acute ST or T changes Telemetry:  Telemetry was personally reviewed and demonstrates: Atrial fibrillation with RVR and venticular rates currently 110s-130s  Relevant CV Studies: Echo 11/19/19 1. Left ventricular ejection fraction, by estimation, is >55%. The left  ventricle has normal function. Left ventricular endocardial border not  optimally defined to evaluate regional wall motion. There is severe left  ventricular hypertrophy. Left  ventricular diastolic function could not be evaluated.  2. Right ventricular systolic function is normal. The right ventricular  size is not well visualized. Tricuspid regurgitation signal is inadequate  for assessing PA  pressure.  3. The mitral valve is grossly normal. Trivial mitral valve  regurgitation.  4. The aortic valve is grossly normal. Aortic valve regurgitation not  well assessed.   Laboratory Data:  High Sensitivity Troponin:   Recent  Labs  Lab 12/22/19 1640 12/22/19 1920  TROPONINIHS 3 3     Cardiac EnzymesNo results for input(s): TROPONINI in the last 168 hours. No results for input(s): TROPIPOC in the last 168 hours.  Chemistry Recent Labs  Lab 12/22/19 1104  NA 140  K 3.3*  CL 99  CO2 29  GLUCOSE 163*  BUN 19  CREATININE 1.21*  CALCIUM 7.4*  GFRNONAA 45*  GFRAA 52*  ANIONGAP 12    Recent Labs  Lab 12/22/19 1104  PROT 6.6  ALBUMIN 3.1*  AST 23  ALT 10  ALKPHOS 73  BILITOT 1.2   Hematology Recent Labs  Lab 12/22/19 1104  WBC 8.8  RBC 4.35  HGB 11.8*  HCT 37.5  MCV 86.2  MCH 27.1  MCHC 31.5  RDW 15.3  PLT 310   BNPNo results for input(s): BNP, PROBNP in the last 168 hours.  DDimer No results for input(s): DDIMER in the last 168 hours.   Radiology/Studies:  DG Chest Port 1 View  Result Date: 12/22/2019 CLINICAL DATA:  69 year old female with possible sepsis. Atrial fibrillation with RVR. EXAM: PORTABLE CHEST 1 VIEW COMPARISON:  Chest CT 11/21/2019 and earlier. FINDINGS: Portable AP upright view at 1209 hours. Stable cardiomegaly and mediastinal contours. Normal lung volumes. Visualized tracheal air column is within normal limits. Chronic retrocardiac hypo ventilation appears stable. Elsewhere Allowing for portable technique the lungs are clear. No pneumothorax. No acute osseous abnormality identified. Paucity of bowel gas in the upper abdomen. IMPRESSION: Stable cardiomegaly. No acute cardiopulmonary abnormality. Electronically Signed   By: Genevie Ann M.D.   On: 12/22/2019 12:33    Assessment and Plan:   Permanent atrial fibrillation with RVR --Not a new diagnosis.  Asymptomatic in Afib. Ventricular rates likely exacerbated by current pain from her  catheter and underlying UTI.  Currently rate well controlled with recommendation to prioritize treatment of underlying UTI. --Replete electrolytes as below. Daily BMET.  --Continue current atenolol and diltiazem as BP allows. --Goal ventricular rate <110bpm. --Recommend caution with escalation of rate control as it may exacerbate lower pressures and thus prerenal AKI.  Rates are also likely to improve with recovery from her current infection.  --Avoid using amiodarone unless significantly elevated rate, given she is not anticoagulated and thus risk of thromboembolic event from pharmacologic conversion is still present.  --Digoxin not recommended given AKI and elevated Cr. -- Continue ASA with daily CBC. CHA2DS2VASc score of at least  5 (HTN, age, DM2, female, vascular). She continues to decline anticoagulation and is aware of the risks associated with this decision.   HTN --Currently hypotensive in the setting of sepsis. Caution with BB/CCB/diureticsas they will exacerbate lower pressures and prerenal AKI.   Hypomagnesemia --Mg 1.0.  --Replete with goal 2.0. --Ordered stat BMET for today.  Hypokalemia --K 3.3.  --Replete with goal 4.0. --Ordered stat BMET for today.  AKI --Cr elevated to 1.21. --Baseline Cr 0.97. --Likely 2/2 prerenal AKI and UTI/ sepsis. --Caution with BB/CCB/diuretics. --Continue resuscitation with IVF.  --Ordered stat BMET for today.  Anemia --Hemoglobin 11.8 with hematocrit 37.5.   --Baseline Hgb low and 8.0-12.6 in the setting of her comorbid conditions, including her bladder CA. --Transfuse below Hgb 8.0 per IM.  --Hold ASA if sudden drops in Hgb concerning for acute bleed.  --Ordered stat CBC for today.  H/o urothelial carcinoma --Ordered CBC. --Per oncology.  UTI/sepsis --Per IM/ID.  --Infection likely exacerbating ventricular rates.   DM2 --A1C 6.2 on 7/17. --Ongoing glycemic control recommended with SSI.  For questions or updates,  please contact [place] Please consult [email or web address] for contact info under     Signed, Arvil Chaco, PA-C  12/23/2019 9:11 AM

## 2019-12-23 NOTE — Progress Notes (Addendum)
PROGRESS NOTE    Brittney Shepard  ZOX:[id number] DOB: [birth year] DOA: 12/22/2019 PCP: Marguerita Merles, MD   Brief Narrative: Brittney Shepard is a 69 y.o. 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 fib not on anticoagulation, history of diabetes mellitus, hypertension and an indwelling Foley catheter which was placed during her last hospitalization.  Patient was recently discharged from skilled nursing facility where she went for subacute rehab Patient presented to the ER via EMS for evaluation of rapid atrial fibrillation.  Per EMS patient was noted to have dirty urine and her Foley catheter had been in place for about a month.  Patient complains about having bladder spasms.  Per EMS she was noted to have heart rates between 160s to 200 and received 2.5 mg of IV metoprolol.  She was said to have become hypotensive after receiving the metoprolol and EMS administered 600 cc of fluid bolus.  Upon arrival to the ER her blood pressure had improved and was 115/83. Patient denies having any fever or chills, no chest pain, no abdominal pain or any changes in her bowel habits.  She complains of exertional shortness of breath as well as having the urge to void despite having a Foley catheter in place. In the ER she was still in A. fib with a rapid ventricular rate and her heart rate was between 130 and 150bpm. Labs reveal sodium 140, potassium 3.3, bicarb 29, glucose 169, BUN 19, creatinine 1.21, calcium 7.4, lactic acid 2.2 >> 1.7, white count 8.8, hemoglobin 11.8, hematocrit 37.5, platelet count 310  Chest x-ray reviewed by me shows no acute infiltrate or effusion. Twelve-lead EKG reviewed by me shows rapid A. fib. Patient is a 69 year old female who was brought to the ER by EMS for evaluation of palpitations and shortness of breath. She was noted to  be in rapid atrial fibrillation and received 2.5mg  of Metoprolol in the field with resulting hypotension requiring IVF resuscitation. She was noted to have dirty urine in her Foley catheter and CODE sepsis was called. Patient received IVF resuscitation as well as antibiotic therapy in the ER. She remains tachycardic with heart rate in the 150's and will be admitted to the hospital for further evaluation.  Assessment & Plan:   Principal Problem:   Atrial fibrillation with rapid ventricular response (HCC) Active Problems:   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HCC)   Essential hypertension   Type 2 diabetes mellitus without complication, without long-term current use of insulin (HCC)   Morbid obesity with BMI of 45.0-49.9, adult (HCC)   Hypokalemia Pt admitted with a.fib rvr found to have elevated lactic acid and source of uti, pt DOES NOT meet sepsis criteria on admission nor throughout this stay. Pt did present initially with elevated lactic and suspected  Source, pt was afebrile and no wbc count no organ dysfunction.   Atrial Fibrillation with a RVR Patient with a known history of A Fib who presents with a RVR Continue Cardizem infusion for rate control Continue oral Cardizem and Atenolol Patient has a CHADS2VASC score of 5 and ideally requires long term anticoagulation as primary prophylaxis for an acute stroke . Patient declined long term anticoagulation and is only on Aspirin 81mg  Obtain TSH levels Cardiology consulted.  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  The patient also had a left ureteral stent placement  and has a Foley catheter in place Patient has metastatic disease and is scheduled to follow up with oncology to disscuss  Chemotherapy treatment.  UTI Patient has a chronic indwelling Foley catheter and has pyuria. Her lactic acid level was elevated but shows a downward trend Will place patient on Rocephin 1g IV daily while awaiting  culture results. . Morbid obesity (69.79) Complicates overall prognosis and care.   Type 2 diabetes mellitus.  Continue consistent carbohydrate  Place patient on Glycemic control with insulin.  Chronic Diastolic CHF Continue scheduled Furosemide and Atenolol.   Hypokalemia Secondary to diuretic therapy Supplement potassium Obtain magnesium levels  Hypomagnesemia: replace 73m g iv x 1.   OSA: Autopap per resp.   DVT prophylaxis:  Lovenox Code Status: Full code Family Communication: Greater than 50% of time was spent discussing plan of care with patient at the bedside. He verbalizes understanding and agrees with the plan Disposition Plan: Back to previous home environment Consults called: Cardiology-Dr.Fath.   Subjective: Pt is alert awake and comfortable denies any complaints.  Rt shin wound after hurting it in the walker.   Objective: Vitals:   12/23/19 0829 12/23/19 1255 12/23/19 1533 12/23/19 1551  BP: 107/72 97/71  92/74  Pulse: 85 78  75  Resp: 19 19  19   Temp: 98.5 F (36.9 C) 97.8 F (36.6 C)  98.1 F (36.7 C)  TempSrc: Oral Oral  Oral  SpO2: 98% 98% 96% 97%  Weight:      Height:        Intake/Output Summary (Last 24 hours) at 12/23/2019 1835 Last data filed at 12/23/2019 1700 Gross per 24 hour  Intake 1530 ml  Output 1250 ml  Net 280 ml   Filed Weights   12/22/19 1108 12/23/19 0421  Weight: 120.7 kg 126.3 kg    Examination:  General exam: Appears calm and comfortable  Respiratory system: Clear to auscultation. Respiratory effort normal. Cardiovascular system: S1 & S2 heard, RRR. No JVD, murmurs, rubs, gallops or clicks. No pedal edema. Gastrointestinal system: Abdomen is nondistended, soft and nontender. No organomegaly or masses felt. Normal bowel sounds heard. Central nervous system: Alert and oriented. No focal neurological deficits. Extremities: Symmetric 5 x 5 power. Skin: No rashes, lesions or ulcers Psychiatry: Judgement  and insight appear normal. Mood & affect appropriate.    Data Reviewed: I have personally reviewed following labs and imaging studies  CBC: Recent Labs  Lab 12/22/19 1104 12/23/19 1157  WBC 8.8 6.9  NEUTROABS 6.2  --   HGB 11.8* 11.2*  HCT 37.5 36.1  MCV 86.2 88.9  PLT 310 480   Basic Metabolic Panel: Recent Labs  Lab 12/22/19 1104 12/22/19 1640 12/23/19 1157  NA 140  --  135  K 3.3*  --  3.7  CL 99  --  97*  CO2 29  --  24  GLUCOSE 163*  --  130*  BUN 19  --  16  CREATININE 1.21*  --  1.04*  CALCIUM 7.4*  --  7.1*  MG  --  1.0* 1.5*   GFR: Estimated Creatinine Clearance: 66.3 mL/min (A) (by C-G formula based on SCr of 1.04 mg/dL (H)). Liver Function Tests: Recent Labs  Lab 12/22/19 1104  AST 23  ALT 10  ALKPHOS 73  BILITOT 1.2  PROT 6.6  ALBUMIN 3.1*   No results for input(s): LIPASE, AMYLASE in the last 168 hours. No results for input(s): AMMONIA in the last 168 hours. Coagulation Profile: Recent Labs  Lab  12/22/19 1104  INR 1.2   Cardiac Enzymes: No results for input(s): CKTOTAL, CKMB, CKMBINDEX, TROPONINI in the last 168 hours. BNP (last 3 results) No results for input(s): PROBNP in the last 8760 hours. HbA1C: No results for input(s): HGBA1C in the last 72 hours. CBG: Recent Labs  Lab 12/22/19 1638 12/22/19 2207 12/23/19 0830 12/23/19 1251 12/23/19 1637  GLUCAP 89 138* 129* 107* 118*   Lipid Profile: No results for input(s): CHOL, HDL, LDLCALC, TRIG, CHOLHDL, LDLDIRECT in the last 72 hours. Thyroid Function Tests: Recent Labs    12/22/19 1640  TSH 0.878   Anemia Panel: No results for input(s): VITAMINB12, FOLATE, FERRITIN, TIBC, IRON, RETICCTPCT in the last 72 hours. Sepsis Labs: Recent Labs  Lab 12/22/19 1104 12/22/19 1311  LATICACIDVEN 2.2* 1.7    Recent Results (from the past 240 hour(s))  Blood Culture (routine x 2)     Status: None (Preliminary result)   Collection Time: 12/22/19 11:04 AM   Specimen: BLOOD  Result  Value Ref Range Status   Specimen Description BLOOD RIGHT FOREARM  Final   Special Requests   Final    BOTTLES DRAWN AEROBIC AND ANAEROBIC Blood Culture adequate volume   Culture   Final    NO GROWTH < 24 HOURS Performed at [facility], [address], [place], [place] [address]    Report Status PENDING  Incomplete  Blood Culture (routine x 2)     Status: None (Preliminary result)   Collection Time: 12/22/19 11:04 AM   Specimen: BLOOD  Result Value Ref Range Status   Specimen Description BLOOD RIGHT FOREARM  Final   Special Requests   Final    BOTTLES DRAWN AEROBIC AND ANAEROBIC Blood Culture results may not be optimal due to an inadequate volume of blood received in culture bottles   Culture   Final    NO GROWTH < 24 HOURS Performed at [facility], [address], [place], [place] [address]    Report Status PENDING  Incomplete  SARS Coronavirus 2 by RT PCR (hospital order, performed in [place] hospital lab) Nasopharyngeal Nasopharyngeal Swab     Status: None   Collection Time: 12/22/19 11:12 AM   Specimen: Nasopharyngeal Swab  Result Value Ref Range Status   SARS Coronavirus 2 NEGATIVE NEGATIVE Final    Comment: (NOTE) SARS-CoV-2 target nucleic acids are NOT DETECTED.  The SARS-CoV-2 RNA is generally detectable in upper and lower respiratory specimens during the acute phase of infection. The lowest concentration of SARS-CoV-2 viral copies this assay can detect is 250 copies / mL. A negative result does not preclude SARS-CoV-2 infection and should not be used as the sole basis for treatment or other patient management decisions.  A negative result may occur with improper specimen collection / handling, submission of specimen other than nasopharyngeal swab, presence of viral mutation(s) within the areas targeted by this assay, and inadequate number of viral copies (<250 copies / mL). A negative result must be combined with clinical observations, patient  history, and epidemiological information.  Fact Sheet for Patients:   StrictlyIdeas.no  Fact Sheet for Healthcare Providers: BankingDealers.co.za  This test is not yet approved or  cleared by the [place] FDA and has been authorized for detection and/or diagnosis of SARS-CoV-2 by FDA under an Emergency Use Authorization (EUA).  This EUA will remain in effect (meaning this test can be used) for the duration of the COVID-19 declaration under Section 564(b)(1) of the Act, 21 U.S.C. section 360bbb-3(b)(1), unless  the authorization is terminated or revoked sooner.  Performed at [facility], [address] [place], [place] [address]          Radiology Studies: [facility] Chest [place] 1 View  Result Date: 12/22/2019 CLINICAL DATA:  69 year old female with possible sepsis. Atrial fibrillation with RVR. EXAM: PORTABLE CHEST 1 VIEW COMPARISON:  Chest CT 11/21/2019 and earlier. FINDINGS: Portable AP upright view at 1209 hours. Stable cardiomegaly and mediastinal contours. Normal lung volumes. Visualized tracheal air column is within normal limits. Chronic retrocardiac hypo ventilation appears stable. Elsewhere Allowing for portable technique the lungs are clear. No pneumothorax. No acute osseous abnormality identified. Paucity of bowel gas in the upper abdomen. IMPRESSION: Stable cardiomegaly. No acute cardiopulmonary abnormality. Electronically Signed   By: Genevie Ann M.D.   On: 12/22/2019 12:33        Scheduled Meds: . aspirin EC  81 mg Oral Daily  . atenolol  25 mg Oral Daily  . Chlorhexidine Gluconate Cloth  6 each Topical Daily  . diltiazem  60 mg Oral Q12H  . enoxaparin (LOVENOX) injection  40 mg Subcutaneous BID  . furosemide  40 mg Oral BID  . insulin aspart  0-20 Units Subcutaneous TID WC  . ipratropium-albuterol  3 mL Nebulization TID  . multivitamin with minerals  1 tablet Oral Daily  . [START ON 12/24/2019] pneumococcal 23  valent vaccine  0.5 mL Intramuscular Tomorrow-1000  . potassium chloride  10 mEq Oral BID  . senna-docusate  1 tablet Oral BID  . sodium chloride flush  3 mL Intravenous Q12H   Continuous Infusions: . sodium chloride    . cefTRIAXone (ROCEPHIN)  IV 2 g (12/23/19 0918)  . diltiazem (CARDIZEM) infusion Stopped (12/22/19 2202)     LOS: 1 day    Para Skeans, MD [facility] Pager [phone] If 7PM-7AM, please contact night-coverage [email or web address] Password [facility] 12/23/2019, 6:35 PM

## 2019-12-23 NOTE — Progress Notes (Signed)
Attempted cpap with patient. cpap starting pressure at minimal setting. Patient with mask on face less than 5 mins then took off. Stated she would not be able to wear. Inability to tolerate mask on face as well as too much air. RN aware

## 2019-12-23 NOTE — Progress Notes (Signed)
[place] visited pt. per OR for AD education; pt. lying down in bed, awake and slightly irritated/impatient.  Pt. shared that she was admitted earlier last month and was diagnosed with cancer; substantial family history of cancer makes this diagnosis particularly alarming for her.  She was discharged to [facility] but was readmitted last night.  Pt. is struggling to cope with her cancer diagnosis and continued hospitalization; she shared wondering why this is happening to her.  Pt. discussed some uncertainty about what treatments her insurance might cover; she says she is not interested in hospice at this time because hospice would mean she would have to stop cancer treatment.  Pt. is interested in completing AD, and [place] left copy in rm. for pt. to read over.  [place] prayed for pt.'s time recovering and for the cancer treatment ahead of her.

## 2019-12-23 NOTE — Progress Notes (Signed)
Patient admitted to room 245 awake and alert. No distress noted. HR 89-111 controlled afib. Foley cath in place 34F with cloudy, sediment urine. Moist associated skin irritation under left break, perirenal,  and abdominal folds. Wound noted to right lower leg with dressing dated 12/15/19 on it. Under the dressing there was an unstageable slough wound.  Wound care consult placed. Wet-to-dry dressing placed. NP made aware. Patient oriented to unit and staff. Call bell placed within reach. Safety maintained bed alarm in place. Positioned for comfort. Will continue to monitor.

## 2019-12-24 LAB — COMPREHENSIVE METABOLIC PANEL
ALT: 9 U/L (ref 0–44)
AST: 22 U/L (ref 15–41)
Albumin: 2.5 g/dL — ABNORMAL LOW (ref 3.5–5.0)
Alkaline Phosphatase: 64 U/L (ref 38–126)
Anion gap: 12 (ref 5–15)
BUN: 14 mg/dL (ref 8–23)
CO2: 28 mmol/L (ref 22–32)
Calcium: 7.4 mg/dL — ABNORMAL LOW (ref 8.9–10.3)
Chloride: 98 mmol/L (ref 98–111)
Creatinine, Ser: 0.93 mg/dL (ref 0.44–1.00)
GFR calc Af Amer: >60 mL/min (ref >60)
GFR calc non Af Amer: >60 mL/min (ref >60)
Glucose, Bld: 125 mg/dL — ABNORMAL HIGH (ref 70–99)
Potassium: 3.8 mmol/L (ref 3.5–5.1)
Sodium: 138 mmol/L (ref 135–145)
Total Bilirubin: 1 mg/dL (ref 0.3–1.2)
Total Protein: 5.9 g/dL — ABNORMAL LOW (ref 6.5–8.1)

## 2019-12-24 LAB — CBC WITH DIFFERENTIAL/PLATELET
Abs Immature Granulocytes: 0.03 10*3/uL (ref 0.00–0.07)
Basophils Absolute: 0.1 10*3/uL (ref 0.0–0.1)
Basophils Relative: 1 %
Eosinophils Absolute: 0.5 10*3/uL (ref 0.0–0.5)
Eosinophils Relative: 7 %
HCT: 37.5 % (ref 36.0–46.0)
Hemoglobin: 11.7 g/dL — ABNORMAL LOW (ref 12.0–15.0)
Immature Granulocytes: 0 %
Lymphocytes Relative: 17 %
Lymphs Abs: 1.3 10*3/uL (ref 0.7–4.0)
MCH: 27.3 pg (ref 26.0–34.0)
MCHC: 31.2 g/dL (ref 30.0–36.0)
MCV: 87.6 fL (ref 80.0–100.0)
Monocytes Absolute: 0.6 10*3/uL (ref 0.1–1.0)
Monocytes Relative: 8 %
Neutro Abs: 5.1 10*3/uL (ref 1.7–7.7)
Neutrophils Relative %: 67 %
Platelets: 175 10*3/uL (ref 150–400)
RBC: 4.28 MIL/uL (ref 3.87–5.11)
RDW: 15.7 % — ABNORMAL HIGH (ref 11.5–15.5)
WBC: 7.6 10*3/uL (ref 4.0–10.5)
nRBC: 0 % (ref 0.0–0.2)

## 2019-12-24 LAB — GLUCOSE, CAPILLARY
Glucose-Capillary: 135 mg/dL — ABNORMAL HIGH (ref 70–99)
Glucose-Capillary: 200 mg/dL — ABNORMAL HIGH (ref 70–99)
Glucose-Capillary: 126 mg/dL — ABNORMAL HIGH (ref 70–99)
Glucose-Capillary: 132 mg/dL — ABNORMAL HIGH (ref 70–99)

## 2019-12-24 LAB — MAGNESIUM: Magnesium: 1.6 mg/dL — ABNORMAL LOW (ref 1.7–2.4)

## 2019-12-24 LAB — PHOSPHORUS: Phosphorus: 2.8 mg/dL (ref 2.5–4.6)

## 2019-12-24 MED ORDER — MAGNESIUM SULFATE 2 GM/50ML IV SOLN
2.0000 g | Freq: Once | INTRAVENOUS | Status: AC
  Administered 2019-12-24: 2 g via INTRAVENOUS
  Filled 2019-12-24: qty 50

## 2019-12-24 NOTE — Progress Notes (Addendum)
PROGRESS NOTE    Brittney Shepard  NGE:[id number] DOB: 01/02/1951 DOA: 12/22/2019 PCP: Marguerita Merles, MD   Brief Narrative: Brittney Shepard is a 69 y.o. 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 fib not on anticoagulation, history of diabetes mellitus, hypertension and an indwelling Foley catheter which was placed during her last hospitalization.  Patient was recently discharged from skilled nursing facility where she went for subacute rehab Patient presented to the ER via EMS for evaluation of rapid atrial fibrillation.  Per EMS patient was noted to have dirty urine and her Foley catheter had been in place for about a month.  Patient complains about having bladder spasms.  Per EMS she was noted to have heart rates between 160s to 200 and received 2.5 mg of IV metoprolol.  She was said to have become hypotensive after receiving the metoprolol and EMS administered 600 cc of fluid bolus.  Upon arrival to the ER her blood pressure had improved and was 115/83. Patient denies having any fever or chills, no chest pain, no abdominal pain or any changes in her bowel habits.  She complains of exertional shortness of breath as well as having the urge to void despite having a Foley catheter in place. In the ER she was still in A. fib with a rapid ventricular rate and her heart rate was between 130 and 150bpm. Labs reveal sodium 140, potassium 3.3, bicarb 29, glucose 169, BUN 19, creatinine 1.21, calcium 7.4, lactic acid 2.2 >> 1.7, white count 8.8, hemoglobin 11.8, hematocrit 37.5, platelet count 310  Chest x-ray reviewed by me shows no acute infiltrate or effusion. Twelve-lead EKG reviewed by me shows rapid A. fib. Patient is a 69 year old female who was brought to the ER by EMS for evaluation of palpitations and shortness of breath. She was noted to  be in rapid atrial fibrillation and received 2.5mg  of Metoprolol in the field with resulting hypotension requiring IVF resuscitation. She was noted to have dirty urine in her Foley catheter and CODE sepsis was called. Patient received IVF resuscitation as well as antibiotic therapy in the ER. She remains tachycardic with heart rate in the 150's and will be admitted to the hospital for further evaluation.  Assessment & Plan:   Principal Problem:   Atrial fibrillation with rapid ventricular response (HCC) Active Problems:   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HCC)   Essential hypertension   Type 2 diabetes mellitus without complication, without long-term current use of insulin (HCC)   Morbid obesity with BMI of 45.0-49.9, adult (HCC)   Hypokalemia Pt admitted with a.fib rvr found to have elevated lactic acid and source of uti, pt DOES NOT meet sepsis criteria on admission nor throughout this stay. Pt did present initially with elevated lactic and suspected  Source, pt was afebrile and no wbc count no organ dysfunction.   Atrial Fibrillation with a RVR: Patient with a known history of A Fib who presents with a RVR Continue Cardizem infusion for rate control Continue oral Cardizem and Atenolol Patient has a CHADS2VASC score of 5 and ideally requires long term anticoagulation as primary prophylaxis for an acute stroke . Patient declined long term anticoagulation and is only on Aspirin 81mg  Obtain TSH levels. Cardiology consulted-Greatly appreciate management and care.  Pt refuses Anticoagulation per cardiology documentation.  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   UTI: Patient has a chronic indwelling Foley catheter and has  pyuria. Her lactic acid level was elevated but shows a downward trend Will place patient on Rocephin 1g IV daily while awaiting culture results,Which are still pending. . Morbid obesity : Complicates overall prognosis and care.  Type 2 diabetes mellitus: Continue consistent carbohydrate  Place patient on Glycemic control with insulin.  Chronic Diastolic CHF: Continue scheduled Furosemide and Atenolol.  Hypokalemia: Secondary to diuretic therapy Supplement potassium.  Hypomagnesemia: replace 59m g iv x 1.   OSA: Autopap per resp, pt was not tolerated. Outpatient eval per pcp, nasal cpap consider oral appliance .  DVT prophylaxis:  Lovenox Code Status: Full code Family Communication: Greater than 50% of time was spent discussing plan of care with patient at the bedside. He verbalizes understanding and agrees with the plan Disposition Plan: Back to previous home environment Consults called: Cardiology-Dr.Fath.  Subjective: Pt is alert awake and comfortable denies any complaints.  Rt shin wound after hurting it in the walker.   Pt is alert and d/w her about PT consult and Home health. Suspect pt may need STR,.  Objective: Vitals:   12/24/19 0359 12/24/19 0820 12/24/19 0858 12/24/19 1149  BP: 106/73 (!) 116/55  99/61  Pulse: 97 62  82  Resp: 20 18  18   Temp: 97.8 F (36.6 C) 97.6 F (36.4 C)  97.7 F (36.5 C)  TempSrc: Oral Oral  Oral  SpO2: 98% 99% 99% 98%  Weight:      Height:        Intake/Output Summary (Last 24 hours) at 12/24/2019 1344 Last data filed at 12/24/2019 1238 Gross per 24 hour  Intake 240 ml  Output 1626 ml  Net -1386 ml   Filed Weights   12/22/19 1108 12/23/19 0421  Weight: 120.7 kg 126.3 kg   Blood pressure 99/61, pulse 82, temperature 97.7 F (36.5 C), temperature source Oral, resp. rate 18, height 5\' 5"  (1.651 m), weight 126.3 kg, SpO2 98 %. Examination: General exam: Appears calm and comfortable  Respiratory system: Clear to  auscultation. Respiratory effort normal. Cardiovascular system: S1 & S2 heard, RRR. No JVD, murmurs, rubs, gallops or clicks. No pedal edema. Gastrointestinal system: Abdomen is nondistended, soft and nontender. No organomegaly or masses felt. Normal bowel sounds heard. Central nervous system: Alert and oriented. No focal neurological deficits. Extremities: Symmetric 5 x 5 power. Skin: No rashes, lesions or ulcers Psychiatry: Judgement and insight appear normal. Mood & affect appropriate.   Data Reviewed: I have personally reviewed following labs and imaging studies  CBC: Recent Labs  Lab 12/22/19 1104 12/23/19 1157 12/24/19 0738  WBC 8.8 6.9 7.6  NEUTROABS 6.2  --  5.1  HGB 11.8* 11.2* 11.7*  HCT 37.5 36.1 37.5  MCV 86.2 88.9 87.6  PLT 310 265 440   Basic Metabolic Panel: Recent Labs  Lab 12/22/19 1104 12/22/19 1640 12/23/19 1157 12/24/19 0453  NA 140  --  135 138  K 3.3*  --  3.7 3.8  CL 99  --  97* 98  CO2 29  --  24 28  GLUCOSE 163*  --  130* 125*  BUN 19  --  16 14  CREATININE 1.21*  --  1.04* 0.93  CALCIUM 7.4*  --  7.1* 7.4*  MG  --  1.0* 1.5* 1.6*  PHOS  --   --   --  2.8   GFR: Estimated Creatinine Clearance: 74.2 mL/min (by C-G formula based on SCr of 0.93 mg/dL). Liver Function Tests: Recent Labs  Lab 12/22/19 1104 12/24/19 0453  AST 23 22  ALT 10 9  ALKPHOS 73 64  BILITOT 1.2 1.0  PROT 6.6 5.9*  ALBUMIN 3.1* 2.5*   No results for input(s): LIPASE, AMYLASE in the last 168 hours. No results for input(s): AMMONIA in the last 168 hours. Coagulation Profile: Recent Labs  Lab 12/22/19 1104  INR 1.2   Cardiac Enzymes: No results for input(s): CKTOTAL, CKMB, CKMBINDEX, TROPONINI in the last 168 hours. BNP (last 3 results) No results for input(s): PROBNP in the last 8760 hours. HbA1C: No results for input(s): HGBA1C in the last 72 hours. CBG: Recent Labs  Lab 12/23/19 1637 12/23/19 2052 12/23/19 2136 12/24/19 0820 12/24/19 1146  GLUCAP  118* 175* 152* 135* 200*   Lipid Profile: No results for input(s): CHOL, HDL, LDLCALC, TRIG, CHOLHDL, LDLDIRECT in the last 72 hours. Thyroid Function Tests: Recent Labs    12/22/19 1640  TSH 0.878   Anemia Panel: No results for input(s): VITAMINB12, FOLATE, FERRITIN, TIBC, IRON, RETICCTPCT in the last 72 hours. Sepsis Labs: Recent Labs  Lab 12/22/19 1104 12/22/19 1311  LATICACIDVEN 2.2* 1.7    Recent Results (from the past 240 hour(s))  Blood Culture (routine x 2)     Status: None (Preliminary result)   Collection Time: 12/22/19 11:04 AM   Specimen: BLOOD  Result Value Ref Range Status   Specimen Description BLOOD RIGHT FOREARM  Final   Special Requests   Final    BOTTLES DRAWN AEROBIC AND ANAEROBIC Blood Culture adequate volume   Culture   Final    NO GROWTH 2 DAYS Performed at [facility], [address] [place], [place] [address]    Report Status PENDING  Incomplete  Blood Culture (routine x 2)     Status: None (Preliminary result)   Collection Time: 12/22/19 11:04 AM   Specimen: BLOOD  Result Value Ref Range Status   Specimen Description BLOOD RIGHT FOREARM  Final   Special Requests   Final    BOTTLES DRAWN AEROBIC AND ANAEROBIC Blood Culture results may not be optimal due to an inadequate volume of blood received in culture bottles   Culture   Final    NO GROWTH 2 DAYS Performed at [facility], [address] [place], [place] [address]    Report Status PENDING  Incomplete  SARS Coronavirus 2 by RT PCR (hospital order, performed in [place] hospital lab) Nasopharyngeal Nasopharyngeal Swab     Status: None   Collection Time: 12/22/19 11:12 AM   Specimen: Nasopharyngeal Swab  Result Value Ref Range Status   SARS Coronavirus 2 NEGATIVE NEGATIVE Final    Comment: (NOTE) SARS-CoV-2 target nucleic acids are NOT DETECTED.  The SARS-CoV-2 RNA is generally detectable in upper and lower respiratory specimens during the acute phase of  infection. The lowest concentration of SARS-CoV-2 viral copies this assay can detect is 250 copies / mL. A negative result does not preclude SARS-CoV-2 infection and should not be used as the sole basis for treatment or other patient management decisions.  A negative result may occur with improper specimen collection / handling, submission of specimen other than nasopharyngeal swab, presence of viral mutation(s) within the areas targeted by this assay, and inadequate  number of viral copies (<250 copies / mL). A negative result must be combined with clinical observations, patient history, and epidemiological information.  Fact Sheet for Patients:   StrictlyIdeas.no  Fact Sheet for Healthcare Providers: BankingDealers.co.za  This test is not yet approved or  cleared by the [place] FDA and has been authorized for detection and/or diagnosis of SARS-CoV-2 by FDA under an Emergency Use Authorization (EUA).  This EUA will remain in effect (meaning this test can be used) for the duration of the COVID-19 declaration under Section 564(b)(1) of the Act, 21 U.S.C. section 360bbb-3(b)(1), unless the authorization is terminated or revoked sooner.  Performed at [facility], [address] [place], [place] [address]   Urine culture     Status: None (Preliminary result)   Collection Time: 12/22/19 11:51 AM   Specimen: In/Out Cath Urine  Result Value Ref Range Status   Specimen Description   Final    IN/OUT CATH URINE Performed at [facility], [address], [place], [place] [address]    Special Requests   Final    NONE Performed at [facility], [address] [place], [place] [address]    Culture   Final    CULTURE REINCUBATED FOR BETTER GROWTH Performed at [facility], [address] [place], [place] [address]    Report Status PENDING  Incomplete     Radiology Studies: No results  found.   Scheduled Meds: . aspirin EC  81 mg Oral Daily  . atenolol  12.5 mg Oral Daily  . Chlorhexidine Gluconate Cloth  6 each Topical Daily  . diltiazem  60 mg Oral Q12H  . enoxaparin (LOVENOX) injection  40 mg Subcutaneous BID  . furosemide  40 mg Oral BID  . insulin aspart  0-20 Units Subcutaneous TID WC  . ipratropium-albuterol  3 mL Nebulization TID  . multivitamin with minerals  1 tablet Oral Daily  . pneumococcal 23 valent vaccine  0.5 mL Intramuscular Tomorrow-1000  . potassium chloride  10 mEq Oral BID  . senna-docusate  1 tablet Oral BID  . sodium chloride flush  3 mL Intravenous Q12H   Continuous Infusions: . sodium chloride    . cefTRIAXone (ROCEPHIN)  IV 2 g (12/24/19 1035)  . diltiazem (CARDIZEM) infusion Stopped (12/22/19 2202)     LOS: 2 days    Para Skeans, MD [facility] Pager [phone] If 7PM-7AM, please contact night-coverage [email or web address] Password [email or web address] 12/24/2019, 1:44 PM

## 2019-12-24 NOTE — Plan of Care (Signed)
  Problem: Clinical Measurements: Goal: Signs and symptoms of infection will decrease Outcome: Progressing  Afebrile, Heart rate within normal limits this shift.

## 2019-12-24 NOTE — Progress Notes (Signed)
Progress Note  Patient Name: Brittney Shepard Date of Encounter: 12/24/2019  [facility] Cardiologist: Kate Sable, MD   Patient Profile     69 y.o. female with history of permanent A. fib, DM 2, bladder tumor status post TURBT in July 2021 with persistent indwelling Foley catheter.  Multiple hospitalizations-July 17 was noted to have a fall.  Was in A. fib RVR and rate control beta-blocker and calcium blockers.  She declined anticoagulation and chose to take the option of aspirin because of falls.  CHA2DS2-VASc score determined to be 5. -> At this stage, she was diagnosed with bladder tumor undergoing treatment with Foley cath placed.  This continues to prevent anticoagulation at this time. *Clinic follow-up on 12/08/2019 Dr. Garen Lah decreased atenolol to 25 mg because of bradycardia.  Again aspirin continued. She now presented on August 20 to [facility] with report of discolored the urine output from Foley.  Also had a recent fall with wound to the right leg.  She is noted significant decreased energy and was found to be in rapid A. fib upon arrival.  Was borderline hypotensive blood pressure 89/53 while in A. fib with RVR rate 100 bpm.  Mild lactic acidosis.  Sepsis protocol activated.  She was noted to be mildly hypokalemic and hypomagnesemic.  Mildly reduced renal function with creatinine 1.2. >  Cardiology consulted for assistance with management of A. fib.   Subjective   Feeling much better today.  Is sitting up and eating.  Less abdominal/pelvic pain.  Breathing notably improved.  Still has a dry cough.  Inpatient Medications    Scheduled Meds: . aspirin EC  81 mg Oral Daily  . atenolol  12.5 mg Oral Daily  . Chlorhexidine Gluconate Cloth  6 each Topical Daily  . diltiazem  60 mg Oral Q12H  . enoxaparin (LOVENOX) injection  40 mg Subcutaneous BID  . furosemide  40 mg Oral BID  . insulin aspart  0-20 Units Subcutaneous TID WC  . ipratropium-albuterol  3 mL Nebulization TID   . multivitamin with minerals  1 tablet Oral Daily  . pneumococcal 23 valent vaccine  0.5 mL Intramuscular Tomorrow-1000  . potassium chloride  10 mEq Oral BID  . senna-docusate  1 tablet Oral BID  . sodium chloride flush  3 mL Intravenous Q12H   Continuous Infusions: . sodium chloride    . cefTRIAXone (ROCEPHIN)  IV 2 g (12/23/19 0918)  . diltiazem (CARDIZEM) infusion Stopped (12/22/19 2202)  . magnesium sulfate bolus IVPB 2 g (12/24/19 0853)   PRN Meds: sodium chloride, acetaminophen, ondansetron (ZOFRAN) IV, polyethylene glycol, sodium chloride flush   Vital Signs    Vitals:   12/23/19 2026 12/24/19 0359 12/24/19 0820 12/24/19 0858  BP:  106/73 (!) 116/55   Pulse:  97 62   Resp:  20 18   Temp:  97.8 F (36.6 C) 97.6 F (36.4 C)   TempSrc:  Oral Oral   SpO2: 97% 98% 99% 99%  Weight:      Height:        Intake/Output Summary (Last 24 hours) at 12/24/2019 0920 Last data filed at 12/24/2019 0640 Gross per 24 hour  Intake 480 ml  Output 1126 ml  Net -646 ml   Last 3 Weights 12/23/2019 12/22/2019 12/20/2019  Weight (lbs) 278 lb 7.1 oz 266 lb 266 lb  Weight (kg) 126.3 kg 120.657 kg 120.657 kg      Telemetry    Atrial fibrillation rates mostly in the 80s and 90s,  occasionally above 100 when moving.- Personally Reviewed  ECG    Last EKG was from 2 days ago -12/04/2019: A. fib RVR 149 bpm.- Personally Reviewed  Physical Exam   GEN: No acute distress.  Morbidly obese. Neck: No visible JVD Cardiac: RRR, no murmurs, rubs, or gallops.  Respiratory:  Diminished breath sounds at the bases, but otherwise CTA B.  Nonlabored. GI: Soft, nontender, non-distended; obese.  Mild hypogastric tenderness. MS: No edema; No deformity. Neuro:  Nonfocal  Psych: Normal affect   Labs    High Sensitivity Troponin:   Recent Labs  Lab 12/22/19 1640 12/22/19 1920  TROPONINIHS 3 3      Chemistry Recent Labs  Lab 12/22/19 1104 12/23/19 1157 12/24/19 0453  NA 140 135 138  K 3.3*  3.7 3.8  CL 99 97* 98  CO2 29 24 28   GLUCOSE 163* 130* 125*  BUN 19 16 14   CREATININE 1.21* 1.04* 0.93  CALCIUM 7.4* 7.1* 7.4*  PROT 6.6  --  5.9*  ALBUMIN 3.1*  --  2.5*  AST 23  --  22  ALT 10  --  9  ALKPHOS 73  --  64  BILITOT 1.2  --  1.0  GFRNONAA 45* 54* >60  GFRAA 52* >60 >60  ANIONGAP 12 14 12      Hematology Recent Labs  Lab 12/22/19 1104 12/23/19 1157 12/24/19 0738  WBC 8.8 6.9 7.6  RBC 4.35 4.06 4.28  HGB 11.8* 11.2* 11.7*  HCT 37.5 36.1 37.5  MCV 86.2 88.9 87.6  MCH 27.1 27.6 27.3  MCHC 31.5 31.0 31.2  RDW 15.3 15.7* 15.7*  PLT 310 265 175    BNPNo results for input(s): BNP, PROBNP in the last 168 hours.   DDimer No results for input(s): DDIMER in the last 168 hours.   Radiology    DG Chest Port 1 View  Result Date: 12/22/2019 CLINICAL DATA:  69 year old female with possible sepsis. Atrial fibrillation with RVR. EXAM: PORTABLE CHEST 1 VIEW COMPARISON:  Chest CT 11/21/2019 and earlier. FINDINGS: Portable AP upright view at 1209 hours. Stable cardiomegaly and mediastinal contours. Normal lung volumes. Visualized tracheal air column is within normal limits. Chronic retrocardiac hypo ventilation appears stable. Elsewhere Allowing for portable technique the lungs are clear. No pneumothorax. No acute osseous abnormality identified. Paucity of bowel gas in the upper abdomen. IMPRESSION: Stable cardiomegaly. No acute cardiopulmonary abnormality. Electronically Signed   By: Genevie Ann M.D.   On: 12/22/2019 12:33    Cardiac Studies    Echo (11/19/2019): EF> 55%.  Severe LVH.  Trivial MR.  Assessment & Plan    1. Permanent A. fib-now with RVR (in setting of sepsis)  CHA2DS2-VASc score-5 (hypertension, age, DM 2, female, vascular disease): However, with indwelling catheter and concern of anemia, the patient has declined DOAC.  Can discuss in the outpatient setting.  Suspect tachycardia rate is probably related to ongoing sepsis: goal heart rate less than 100-110  bpm.  Continue current dose of atenolol (decreased from home dose down to 12.5 mg) and diltiazem for now   Atenolol had recently been decreased down to 25 mg because of bradycardia now reduce to 12.5 mg due to concerns for hypotension. 2. HFpEF-exacerbated by A. fib.  On standing-home dose oral diuretic - > seems relatively euvolemic. 3. Hypertension -> sepsis, has actually been hypotensive this limits her use of beta-blocker/calcium channel blocker as well as diuretic  4. Hypomagnesemia/hypokalemia: Recommend replete Mag to 2.0 and K+ 4.0.  Magnesium now 1.6,  continue to replete  Potassium improved to 3.8. ->  Continue oral repletion 5. Mild AKI-creatinine now improved to back to baseline of 0.93.;  Cautious titration of antihypertensives and diuretics. 6. Mild anemia, stable. 7. UTI/sepsis-on antibiotics and management per primary team. 8. Diabetes-2:  also managed by primary team.    She is profoundly weak and deconditioned.  Has a difficulty caring for self.  Consider evaluation for possible skilled nursing on discharge. I do suspect that she is nearing discharge, but may benefit from chemotherapy assistance.   For questions or updates, please contact [place] Please consult [email or web address] for contact info under        Signed, Glenetta Hew, MD  12/24/2019, 9:20 AM

## 2019-12-24 NOTE — Progress Notes (Signed)
Physical Therapy Evaluation Patient Details Name: Brittney Shepard MRN: [id number] DOB: Mar 30, 1951 Today's Date: 12/24/2019   History of Present Illness  Per MD note:Brittney Shepard is a 69 y.o. 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 fib not on anticoagulation, history of diabetes mellitus, hypertension and an indwelling Foley catheter which was placed during her last hospitalization.  Patient was recently discharged from skilled nursing facility where she went for subacute rehab  Clinical Impression  Patient agrees to PT evaluation. She has decreased BLE strength. She needs mod assist for supine <> sit bed mobility and is unable to transfer sit to stand today due to fatigue. Patient reports that she was able to ambulate at home and recently returned home from a SNF. She has good sitting balance. She will benefit from skilled PT to improve mobility and strength.     Follow Up Recommendations SNF    Equipment Recommendations  None recommended by PT    Recommendations for Other Services       Precautions / Restrictions Precautions Precautions: None Restrictions Weight Bearing Restrictions: No      Mobility  Bed Mobility Overal bed mobility: Needs Assistance Bed Mobility: Supine to Sit;Sit to Supine     Supine to sit: Min assist Sit to supine: Min assist      Transfers Overall transfer level:  (unable )                  Ambulation/Gait                Stairs            Wheelchair Mobility    Modified Rankin (Stroke Patients Only)       Balance Overall balance assessment: Modified Independent                                           Pertinent Vitals/Pain Pain Assessment: No/denies pain    Home Living Family/patient expects to be discharged to:: Private  residence Living Arrangements: Alone Available Help at Discharge: Other (Comment) (reports very little help)   Home Access: Ramped entrance       Home Equipment: Walker - 2 wheels      Prior Function Level of Independence: Needs assistance   Gait / Transfers Assistance Needed: MI with RW           Hand Dominance        Extremity/Trunk Assessment   Upper Extremity Assessment Upper Extremity Assessment: Overall WFL for tasks assessed    Lower Extremity Assessment Lower Extremity Assessment: RLE deficits/detail;LLE deficits/detail RLE Deficits / Details: -3/5 LLE hip flex, abd, knee flex 3/5 LLE Deficits / Details: -3/5 LLE hip flex, abd, knee flex 3/5       Communication   Communication: No difficulties  Cognition Arousal/Alertness: Awake/alert Behavior During Therapy: WFL for tasks assessed/performed Overall Cognitive Status: Within Functional Limits for tasks assessed                                        General Comments      Exercises     Assessment/Plan    PT Assessment Patient needs continued  PT services  PT Problem List Decreased strength;Decreased activity tolerance;Decreased balance;Decreased mobility       PT Treatment Interventions Gait training;Functional mobility training;Therapeutic activities;Therapeutic exercise    PT Goals (Current goals can be found in the Care Plan section)  Acute Rehab PT Goals Patient Stated Goal: to get stronger PT Goal Formulation: With patient Time For Goal Achievement: 01/07/20 Potential to Achieve Goals: Good    Frequency Min 2X/week   Barriers to discharge Decreased caregiver support      Co-evaluation               AM-PAC PT "6 Clicks" Mobility  Outcome Measure Help needed turning from your back to your side while in a flat bed without using bedrails?: A Little Help needed moving from lying on your back to sitting on the side of a flat bed without using bedrails?: A  Lot Help needed moving to and from a bed to a chair (including a wheelchair)?: Total Help needed standing up from a chair using your arms (e.g., wheelchair or bedside chair)?: Total Help needed to walk in hospital room?: Total Help needed climbing 3-5 steps with a railing? : Total 6 Click Score: 9    End of Session Equipment Utilized During Treatment: Gait belt Activity Tolerance: Patient tolerated treatment well Patient left: in bed;with bed alarm set Nurse Communication: Mobility status PT Visit Diagnosis: Unsteadiness on feet (R26.81);Muscle weakness (generalized) (M62.81);History of falling (Z91.81);Difficulty in walking, not elsewhere classified (R26.2)    Time: 8466-5993 PT Time Calculation (min) (ACUTE ONLY): 15 min   Charges:   PT Evaluation $PT Eval Low Complexity: 1 Low PT Treatments $Therapeutic Activity: 8-22 mins          Alanson Puls, PT DPT 12/24/2019, 2:26 PM

## 2019-12-25 LAB — COMPREHENSIVE METABOLIC PANEL
ALT: 8 U/L (ref 0–44)
AST: 18 U/L (ref 15–41)
Albumin: 2.6 g/dL — ABNORMAL LOW (ref 3.5–5.0)
Alkaline Phosphatase: 63 U/L (ref 38–126)
Anion gap: 9 (ref 5–15)
BUN: 13 mg/dL (ref 8–23)
CO2: 27 mmol/L (ref 22–32)
Calcium: 7.3 mg/dL — ABNORMAL LOW (ref 8.9–10.3)
Chloride: 100 mmol/L (ref 98–111)
Creatinine, Ser: 0.83 mg/dL (ref 0.44–1.00)
GFR calc Af Amer: >60 mL/min (ref >60)
GFR calc non Af Amer: >60 mL/min (ref >60)
Glucose, Bld: 128 mg/dL — ABNORMAL HIGH (ref 70–99)
Potassium: 3.3 mmol/L — ABNORMAL LOW (ref 3.5–5.1)
Sodium: 136 mmol/L (ref 135–145)
Total Bilirubin: 0.9 mg/dL (ref 0.3–1.2)
Total Protein: 5.8 g/dL — ABNORMAL LOW (ref 6.5–8.1)

## 2019-12-25 LAB — URINE CULTURE: Culture: 20000 — AB

## 2019-12-25 LAB — GLUCOSE, CAPILLARY
Glucose-Capillary: 131 mg/dL — ABNORMAL HIGH (ref 70–99)
Glucose-Capillary: 123 mg/dL — ABNORMAL HIGH (ref 70–99)
Glucose-Capillary: 136 mg/dL — ABNORMAL HIGH (ref 70–99)
Glucose-Capillary: 163 mg/dL — ABNORMAL HIGH (ref 70–99)

## 2019-12-25 LAB — CBC WITH DIFFERENTIAL/PLATELET
Abs Immature Granulocytes: 0.02 10*3/uL (ref 0.00–0.07)
Basophils Absolute: 0.1 10*3/uL (ref 0.0–0.1)
Basophils Relative: 1 %
Eosinophils Absolute: 0.5 10*3/uL (ref 0.0–0.5)
Eosinophils Relative: 6 %
HCT: 33.5 % — ABNORMAL LOW (ref 36.0–46.0)
Hemoglobin: 10.9 g/dL — ABNORMAL LOW (ref 12.0–15.0)
Immature Granulocytes: 0 %
Lymphocytes Relative: 16 %
Lymphs Abs: 1.2 10*3/uL (ref 0.7–4.0)
MCH: 27.7 pg (ref 26.0–34.0)
MCHC: 32.5 g/dL (ref 30.0–36.0)
MCV: 85 fL (ref 80.0–100.0)
Monocytes Absolute: 0.5 10*3/uL (ref 0.1–1.0)
Monocytes Relative: 7 %
Neutro Abs: 5.3 10*3/uL (ref 1.7–7.7)
Neutrophils Relative %: 70 %
Platelets: 245 10*3/uL (ref 150–400)
RBC: 3.94 MIL/uL (ref 3.87–5.11)
RDW: 15.7 % — ABNORMAL HIGH (ref 11.5–15.5)
WBC: 7.6 10*3/uL (ref 4.0–10.5)
nRBC: 0 % (ref 0.0–0.2)

## 2019-12-25 LAB — MAGNESIUM: Magnesium: 1.9 mg/dL (ref 1.7–2.4)

## 2019-12-25 LAB — PHOSPHORUS: Phosphorus: 2.9 mg/dL (ref 2.5–4.6)

## 2019-12-25 MED ORDER — POTASSIUM CHLORIDE 10 MEQ/100ML IV SOLN
10.0000 meq | INTRAVENOUS | Status: AC
  Administered 2019-12-25: 10 meq via INTRAVENOUS
  Filled 2019-12-25 (×2): qty 100

## 2019-12-25 MED ORDER — POTASSIUM CHLORIDE CRYS ER 20 MEQ PO TBCR
40.0000 meq | EXTENDED_RELEASE_TABLET | Freq: Once | ORAL | Status: AC
  Administered 2019-12-25: 40 meq via ORAL
  Filled 2019-12-25: qty 2

## 2019-12-25 MED ORDER — POTASSIUM CHLORIDE CRYS ER 20 MEQ PO TBCR
20.0000 meq | EXTENDED_RELEASE_TABLET | Freq: Two times a day (BID) | ORAL | Status: DC
  Administered 2019-12-25 – 2019-12-27 (×4): 20 meq via ORAL
  Filled 2019-12-25 (×4): qty 1

## 2019-12-25 MED ORDER — APIXABAN 5 MG PO TABS
5.0000 mg | ORAL_TABLET | Freq: Two times a day (BID) | ORAL | Status: DC
  Administered 2019-12-25 – 2019-12-26 (×2): 5 mg via ORAL
  Filled 2019-12-25 (×2): qty 1

## 2019-12-25 NOTE — Progress Notes (Addendum)
Progress Note  Patient Name: Brittney Shepard Date of Encounter: 12/25/2019  Primary Cardiologist: Kate Sable, MD  Subjective   Feels pretty good. No c/p, dyspnea, palpitations.  Still bothered by dysuria.  Inpatient Medications    Scheduled Meds: . aspirin EC  81 mg Oral Daily  . atenolol  12.5 mg Oral Daily  . Chlorhexidine Gluconate Cloth  6 each Topical Daily  . diltiazem  60 mg Oral Q12H  . enoxaparin (LOVENOX) injection  40 mg Subcutaneous BID  . furosemide  40 mg Oral BID  . insulin aspart  0-20 Units Subcutaneous TID WC  . ipratropium-albuterol  3 mL Nebulization TID  . multivitamin with minerals  1 tablet Oral Daily  . pneumococcal 23 valent vaccine  0.5 mL Intramuscular Tomorrow-1000  . potassium chloride  10 mEq Oral BID  . senna-docusate  1 tablet Oral BID  . sodium chloride flush  3 mL Intravenous Q12H   Continuous Infusions: . sodium chloride    . cefTRIAXone (ROCEPHIN)  IV 2 g (12/25/19 0930)  . diltiazem (CARDIZEM) infusion Stopped (12/22/19 2202)   PRN Meds: sodium chloride, acetaminophen, ondansetron (ZOFRAN) IV, polyethylene glycol, sodium chloride flush   Vital Signs    Vitals:   12/24/19 2050 12/25/19 0433 12/25/19 0812 12/25/19 0849  BP: 115/63 108/63 108/61   Pulse: 86 79 61   Resp:  18 18   Temp: 98.2 F (36.8 C) 98 F (36.7 C) 97.7 F (36.5 C)   TempSrc: Oral Oral Oral   SpO2: 100% 98% 92% 99%  Weight:  127.6 kg    Height:        Intake/Output Summary (Last 24 hours) at 12/25/2019 1119 Last data filed at 12/25/2019 0738 Gross per 24 hour  Intake 100 ml  Output 1350 ml  Net -1250 ml   Filed Weights   12/22/19 1108 12/23/19 0421 12/25/19 0433  Weight: 120.7 kg 126.3 kg 127.6 kg    Physical Exam   GEN: obese, in no acute distress.  HEENT: Grossly normal w/ exception of poor dentition. Neck: Supple, obese, difficult to gauge jvp. No carotid bruits, or masses. Cardiac: IR, IR, tachy, distant, no murmurs, rubs, or  gallops. No clubbing, cyanosis, edema.  Radials/DP/PT 1+ and equal bilaterally.  Respiratory:  Respirations regular and unlabored, clear to auscultation bilaterally. GI: Obese, soft, nontender, nondistended, BS + x 4. MS: no deformity or atrophy. Skin: warm and dry, no rash. Neuro:  Strength and sensation are intact. Psych: AAOx3.  Normal affect.  Labs    Chemistry Recent Labs  Lab 12/22/19 1104 12/22/19 1104 12/23/19 1157 12/24/19 0453 12/25/19 0459  NA 140   < > 135 138 136  K 3.3*   < > 3.7 3.8 3.3*  CL 99   < > 97* 98 100  CO2 29   < > 24 28 27   GLUCOSE 163*   < > 130* 125* 128*  BUN 19   < > 16 14 13   CREATININE 1.21*   < > 1.04* 0.93 0.83  CALCIUM 7.4*   < > 7.1* 7.4* 7.3*  PROT 6.6  --   --  5.9* 5.8*  ALBUMIN 3.1*  --   --  2.5* 2.6*  AST 23  --   --  22 18  ALT 10  --   --  9 8  ALKPHOS 73  --   --  64 63  BILITOT 1.2  --   --  1.0 0.9  GFRNONAA 45*   < >  54* >60 >60  GFRAA 52*   < > >60 >60 >60  ANIONGAP 12   < > 14 12 9    < > = values in this interval not displayed.     Hematology Recent Labs  Lab 12/23/19 1157 12/24/19 0738 12/25/19 0459  WBC 6.9 7.6 7.6  RBC 4.06 4.28 3.94  HGB 11.2* 11.7* 10.9*  HCT 36.1 37.5 33.5*  MCV 88.9 87.6 85.0  MCH 27.6 27.3 27.7  MCHC 31.0 31.2 32.5  RDW 15.7* 15.7* 15.7*  PLT 265 175 245    Cardiac Enzymes  Recent Labs  Lab 12/22/19 1640 12/22/19 1920  TROPONINIHS 3 3     Lipids  Lab Results  Component Value Date   CHOL 120 03/22/2016   HDL 29 (L) 03/22/2016   LDLCALC 71 03/22/2016   TRIG 102 03/22/2016   CHOLHDL 4.1 03/22/2016    HbA1c  Lab Results  Component Value Date   HGBA1C 6.2 (H) 11/18/2019    Radiology    DG Chest Port 1 View  Result Date: 12/22/2019 CLINICAL DATA:  69 year old female with possible sepsis. Atrial fibrillation with RVR. EXAM: PORTABLE CHEST 1 VIEW COMPARISON:  Chest CT 11/21/2019 and earlier. FINDINGS: Portable AP upright view at 1209 hours. Stable cardiomegaly and  mediastinal contours. Normal lung volumes. Visualized tracheal air column is within normal limits. Chronic retrocardiac hypo ventilation appears stable. Elsewhere Allowing for portable technique the lungs are clear. No pneumothorax. No acute osseous abnormality identified. Paucity of bowel gas in the upper abdomen. IMPRESSION: Stable cardiomegaly. No acute cardiopulmonary abnormality. Electronically Signed   By: Genevie Ann M.D.   On: 12/22/2019 12:33    Telemetry    Afib 90's to 130's (mostly low 100's) - Personally Reviewed  Cardiac Studies   2D Echocardiogram 7.18.2021   1. Left ventricular ejection fraction, by estimation, is >55%. The left  ventricle has normal function. Left ventricular endocardial border not  optimally defined to evaluate regional wall motion. There is severe left  ventricular hypertrophy. Left  ventricular diastolic function could not be evaluated.   2. Right ventricular systolic function is normal. The right ventricular  size is not well visualized. Tricuspid regurgitation signal is inadequate  for assessing PA pressure.   3. The mitral valve is grossly normal. Trivial mitral valve  regurgitation.   4. The aortic valve is grossly normal. Aortic valve regurgitation not  well assessed.   Patient Profile     69 y.o. female with history of permanent A. fib, DM 2, bladder tumor status post TURBT in July 2021 with persistent indwelling Foley catheter.  Multiple hospitalizations-July 17 was noted to have a fall.  Was in A. fib RVR and rate control beta-blocker and calcium blockers.  She declined anticoagulation and chose to take the option of aspirin because of falls.  CHA2DS2-VASc score determined to be 5. -> At this stage, she was diagnosed with bladder tumor undergoing treatment with Foley cath placed.  This continues to prevent anticoagulation at this time. *Clinic follow-up on 12/08/2019 Dr. Garen Lah decreased atenolol to 25 mg because of bradycardia.  Again aspirin  continued. She now presented on August 20 to [facility] with report of discolored the urine output from Foley.  Also had a recent fall with wound to the right leg.  She is noted significant decreased energy and was found to be in rapid A. fib upon arrival.  Was borderline hypotensive blood pressure 89/53 while in A. fib with RVR rate 100 bpm.  Mild lactic  acidosis.  Sepsis protocol activated.  She was noted to be mildly hypokalemic and hypomagnesemic.  Mildly reduced renal function with creatinine 1.2. >  Cardiology consulted for assistance with management of A. fib.  Assessment & Plan    1.  Permanent Afib w/ RVR:  Presented 8/20 w/ urosepsis and in that setting, HRs elevated on top of permanent afib.  Previously required reduction of atenolol dose due to bradycardia as outpt, though tolerating this + dilt 60mg  q12h currently.  HRs trending mostly in low 100's but range is 90-130.  Cont current regimen.  Can look to consolidate dilt to dilt cd 120mg  daily if bp remains stable.  Ongoing mgmt of urosepsis likely biggest issue re: HR mgmt.  CHA2DS2VASc = 5.  She has prev refused Mildred.  2.  HFPEF:  Exacerbated by rapid Afib.  I/O +544 since admission.  Wt is listed as up 7.6kg from admission, but suspect that weight is spurious.  Appears euvolemic on exam.  Cont to work on HR.  BP stable.  Cont home dose of lasix.  3.  Essential HTN:  Stable to soft.  Cont current meds.  4.  Hypomagnesemia/hypokalemia: Mg nl.  K remains low @ 3.3.  Will inc supp to 20 BID.   5.  MIld AKI:  Resolved.  6.  Normocytic anemia:  Relatively stable.  7.  Urosepsis:  Improving.  Hemodynamically stable.  Still w/ dysuria.  Abx per IM.  8.  DM II:  A1c 6.2 in July.  Per IM.  Signed, Murray Hodgkins, NP  12/25/2019, 11:19 AM    For questions or updates, please contact   Please consult [email or web address] for contact info under Cardiology/STEMI.

## 2019-12-25 NOTE — NC FL2 (Signed)
[place] LEVEL OF CARE SCREENING TOOL     IDENTIFICATION  Patient Name: Brittney Shepard Birthdate: 1951/02/16 Sex: female Admission Date (Current Location): 12/22/2019  [place] and [place] Number:  Engineering geologist and Address:  [facility], [address], [place], [place] [address]      Provider Number: [id number]  Attending Physician Name and Address:  Para Skeans, MD  Relative Name and Phone Number:       Current Level of Care: Hospital Recommended Level of Care: [place] Prior Approval Number:    Date Approved/Denied:   PASRR Number: [id number]  Discharge Plan: SNF    Current Diagnoses: Patient Active Problem List   Diagnosis Date Noted  . Hypokalemia 12/22/2019  . Endometrial cancer ([place]) 12/20/2019  . Goals of care, counseling/discussion 12/04/2019  . 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[place])   . Weakness   . Liver metastases ([place])   . Essential hypertension   . Type 2 diabetes mellitus without complication, without long-term current use of insulin ([place])   . Morbid obesity with BMI of 45.0-49.9, adult ([place])   . Atrial fibrillation ([place]) 11/19/2019  . Hematuria, gross   . Postmenopausal bleeding   . Abnormal endometrial ultrasound   . Atrial fibrillation with RVR ([place]) 11/18/2019  . Bladder mass   . Fall   . Acute cystitis without hematuria   . Chronic diastolic CHF (congestive heart failure) ([place]) 05/27/2016  . Primary osteoarthritis of both knees 05/27/2016  . Progressive anemia 05/27/2016  . DNR (do not resuscitate)   . Palliative care by specialist   . Acute respiratory failure with hypoxia and hypercapnia ([place])   . Atrial fibrillation with rapid ventricular response ([place]) 03/20/2016  . Chronic obstructive pulmonary disease with acute exacerbation (HCC)     Orientation RESPIRATION BLADDER Height & Weight     Self, Time, Situation, Place  Normal Continent Weight: 281 lb 6.4 oz (127.6  kg) Height:  5\' 5"  (165.1 cm)  BEHAVIORAL SYMPTOMS/MOOD NEUROLOGICAL BOWEL NUTRITION STATUS      Continent Diet (carb modified, thin liquids)  AMBULATORY STATUS COMMUNICATION OF NEEDS Skin   Limited Assist Verbally  (open wound on right pretibial distal, guaze dressing, change daily)                       Personal Care Assistance Level of Assistance  Bathing, Feeding, Dressing Bathing Assistance: Limited assistance Feeding assistance: Independent Dressing Assistance: Limited assistance     Functional Limitations Info  Sight, Hearing, Speech Sight Info: Adequate Hearing Info: Adequate Speech Info: Adequate    SPECIAL CARE FACTORS FREQUENCY  PT (By licensed PT), OT (By licensed OT)     PT Frequency: 5x OT Frequency: 5x            Contractures Contractures Info: Not present    Additional Factors Info  Code Status, Allergies Code Status Info: Full Code Allergies Info: Penicillin G, Sulfa Antibiotics           Current Medications (12/25/2019):  This is the current hospital active medication list Current Facility-Administered Medications  Medication Dose Route Frequency Provider Last Rate Last Admin  . 0.9 %  sodium chloride infusion  250 mL Intravenous PRN Agbata, Tochukwu, MD      . acetaminophen (TYLENOL) tablet 650 mg  650 mg Oral Q6H PRN Agbata, Tochukwu, MD      . aspirin EC tablet 81 mg  81 mg Oral Daily Agbata, Tochukwu, MD  81 mg at 12/24/19 0837  . atenolol (TENORMIN) tablet 12.5 mg  12.5 mg Oral Daily Para Skeans, MD   12.5 mg at 12/24/19 0836  . cefTRIAXone (ROCEPHIN) 2 g in sodium chloride 0.9 % 100 mL IVPB  2 g Intravenous Q24H Agbata, Tochukwu, MD 200 mL/hr at 12/24/19 1035 2 g at 12/24/19 1035  . Chlorhexidine Gluconate Cloth 2 % PADS 6 each  6 each Topical Daily Agbata, Tochukwu, MD   6 each at 12/24/19 0837  . diltiazem (CARDIZEM SR) 12 hr capsule 60 mg  60 mg Oral Q12H Agbata, Tochukwu, MD   60 mg at 12/24/19 2129  . diltiazem (CARDIZEM) 125 mg  in dextrose 5% 125 mL (1 mg/mL) infusion  5-15 mg/hr Intravenous Titrated Collier Bullock, MD   Stopped at 12/22/19 2202  . enoxaparin (LOVENOX) injection 40 mg  40 mg Subcutaneous BID Agbata, Tochukwu, MD   40 mg at 12/24/19 2129  . furosemide (LASIX) tablet 40 mg  40 mg Oral BID Agbata, Tochukwu, MD   40 mg at 12/24/19 1645  . insulin aspart (novoLOG) injection 0-20 Units  0-20 Units Subcutaneous TID WC Agbata, Tochukwu, MD   3 Units at 12/24/19 1645  . ipratropium-albuterol (DUONEB) 0.5-2.5 (3) MG/3ML nebulizer solution 3 mL  3 mL Nebulization TID Agbata, Tochukwu, MD   3 mL at 12/25/19 0849  . multivitamin with minerals tablet 1 tablet  1 tablet Oral Daily Agbata, Tochukwu, MD   1 tablet at 12/24/19 0836  . ondansetron (ZOFRAN) injection 4 mg  4 mg Intravenous Q6H PRN Agbata, Tochukwu, MD      . pneumococcal 23 valent vaccine (PNEUMOVAX-23) injection 0.5 mL  0.5 mL Intramuscular Tomorrow-1000 Ouma, Bing Neighbors, NP      . polyethylene glycol (MIRALAX / GLYCOLAX) packet 17 g  17 g Oral Daily PRN Agbata, Tochukwu, MD      . potassium chloride (KLOR-CON) CR tablet 10 mEq  10 mEq Oral BID Agbata, Tochukwu, MD   10 mEq at 12/24/19 2129  . senna-docusate (Senokot-S) tablet 1 tablet  1 tablet Oral BID Agbata, Tochukwu, MD   1 tablet at 12/23/19 0912  . sodium chloride flush (NS) 0.9 % injection 3 mL  3 mL Intravenous Q12H Agbata, Tochukwu, MD   3 mL at 12/24/19 2129  . sodium chloride flush (NS) 0.9 % injection 3 mL  3 mL Intravenous PRN Agbata, Tochukwu, MD         Discharge Medications: Please see discharge summary for a list of discharge medications.  Relevant Imaging Results:  Relevant Lab Results:   Additional Information OZD:[id number]  Eileen Stanford, LCSW

## 2019-12-25 NOTE — Care Management Important Message (Signed)
Important Message  Patient Details  Name: Brittney Shepard MRN: [id number] Date of Birth: Feb 15, 1951   Medicare Important Message Given:  N/A - LOS <3 / Initial given by admissions  Initial Medicare IM given by Patient Access Associate on 12/24/2019 at 10:50am.     [name] 12/25/2019, 9:09 AM

## 2019-12-25 NOTE — Progress Notes (Signed)
Physical Therapy Treatment Patient Details Name: Brittney Shepard MRN: [id number] DOB: 06-Dec-1950 Today's Date: 12/25/2019    History of Present Illness Per MD note:Brittney Shepard is a 69 y.o. 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 fib not on anticoagulation, history of diabetes mellitus, hypertension and an indwelling Foley catheter which was placed during her last hospitalization.  Patient was recently discharged from skilled nursing facility where she went for subacute rehab    PT Comments    Pt lying in bed with HOB elevated and agreeable to PT session. Pt reporting burning sensation and pain with urination. Pt performed supine to sit with supervision and required no external physical assistance. Pt required seated rest break while sitting edge of bed due to fatigue and increased work of breathing. Pt able to transfer sit to stand using RW and mod A and ambulated 3 feet to recliner chair with min A for steadying. Mod A for eccentric control on descent to recliner chair. SpO2 at 98% post ambulation with HR 109 bpm. Pt fatigued and performed open chain therex while seated in recliner chair for BLE muscle strengthening. Pt left sitting up in recliner chair with all needs met and BLE elevated in recliner chair. Current d/c plan remains appropriate due to level of assistance required for mobility and decreased functional activity tolerance.    Follow Up Recommendations  SNF     Equipment Recommendations  None recommended by PT    Recommendations for Other Services       Precautions / Restrictions Precautions Precautions: Fall Precaution Comments: high fall risk Restrictions Weight Bearing Restrictions: No    Mobility  Bed Mobility Overal bed mobility: Needs Assistance Bed Mobility: Supine to Sit     Supine to sit:  Supervision     General bed mobility comments: Supervision for supine to sit for safety with HOB elevated; pt utilized bed rails to come into sitting and required increased time to get to full EOB sitting; SOB post activity  Transfers Overall transfer level: Needs assistance Equipment used: Rolling walker (2 wheeled) Transfers: Sit to/from Stand Sit to Stand: Mod assist         General transfer comment: Mod A for sit <> stand transfer for boost into standing and for eccentric control on descent. Verbal cues for hand placement during transfers.  Ambulation/Gait Ambulation/Gait assistance: Min assist Gait Distance (Feet): 3 Feet Assistive device: Rolling walker (2 wheeled) Gait Pattern/deviations: Step-through pattern;Decreased step length - right;Decreased step length - left Gait velocity: decreased   General Gait Details: Pt ambulated 3 feet using RW with min A for steadying; pt with reciprocal gait pattern and fatigued quickly requiring seated rest   Stairs             Wheelchair Mobility    Modified Rankin (Stroke Patients Only)       Balance Overall balance assessment: Needs assistance Sitting-balance support: Feet supported;No upper extremity supported Sitting balance-Leahy Scale: Good Sitting balance - Comments: no overt LOB   Standing balance support: Bilateral upper extremity supported Standing balance-Leahy Scale: Fair Standing balance comment: Fair standing balance with BUE on RW and min A for steadying                            Cognition Arousal/Alertness: Awake/alert Behavior During Therapy: [facility] for tasks assessed/performed  Overall Cognitive Status: Within Functional Limits for tasks assessed                                        Exercises Other Exercises Other Exercises: seated LAQ, alternating marches, and heel/toe raises x 10 resps, 2 sets with verbal cues for correct technique    General Comments         Pertinent Vitals/Pain Pain Assessment: Faces Faces Pain Scale: Hurts even more Pain Location: bladder/sphincter (when urinating) Pain Descriptors / Indicators: Burning Pain Intervention(s): Monitored during session    Home Living                      Prior Function            PT Goals (current goals can now be found in the care plan section) Acute Rehab PT Goals Patient Stated Goal: to get stronger PT Goal Formulation: With patient Time For Goal Achievement: 01/07/20 Potential to Achieve Goals: Good Progress towards PT goals: Progressing toward goals    Frequency    Min 2X/week      PT Plan Current plan remains appropriate    Co-evaluation              AM-PAC PT "6 Clicks" Mobility   Outcome Measure  Help needed turning from your back to your side while in a flat bed without using bedrails?: A Little Help needed moving from lying on your back to sitting on the side of a flat bed without using bedrails?: A Lot Help needed moving to and from a bed to a chair (including a wheelchair)?: A Lot Help needed standing up from a chair using your arms (e.g., wheelchair or bedside chair)?: A Lot Help needed to walk in hospital room?: A Lot Help needed climbing 3-5 steps with a railing? : Total 6 Click Score: 12    End of Session Equipment Utilized During Treatment: Gait belt Activity Tolerance: Patient limited by fatigue;Patient tolerated treatment well Patient left: in chair;with call bell/phone within reach;with chair alarm set Nurse Communication: Mobility status PT Visit Diagnosis: Unsteadiness on feet (R26.81);Muscle weakness (generalized) (M62.81);History of falling (Z91.81);Difficulty in walking, not elsewhere classified (R26.2)     Time: 0175-1025 PT Time Calculation (min) (ACUTE ONLY): 30 min  Charges:                        Vale Haven, SPT   Vale Haven 12/25/2019, 2:46 PM

## 2019-12-25 NOTE — Consult Note (Signed)
[facility] Nurse Consult Note: Reason for Consult: Chronic, nonhealing full thickness wound on RLE with dried serum (scab). Patient cannot recall injury and reports that the area is not painful. It is not consistent with arterial disease or venous insufficiency Wound type: Trauma Pressure Injury POA: N/A Measurement:4.5cm x 2.6cm x 0.2xm Wound bed: 80% dried serum, 20% red, moist Drainage (amount, consistency, odor) Small amt serosanguinous where dry gauze has adhered to tissue and lifted, causing minor bleeding Periwound: Intact Dressing procedure/placement/frequency: I will change from the wet-to-dry saline dressing to a nonadherent antimicrobial dressing (Xeroform) topped with a silicone foam. The gauze is to be changed daily, however the foam may be reused for up to 3 days.   [facility] nursing team will not follow, but will remain available to this patient, the nursing and medical teams.  Please re-consult if needed. Thanks, Maudie Flakes, MSN, RN, [place], Arther Abbott  Pager# [phone]

## 2019-12-25 NOTE — Progress Notes (Signed)
PROGRESS NOTE    Brittney Shepard  UYQ:[id number] DOB: 08-11-1950 DOA: 12/22/2019 PCP: Marguerita Merles, MD   Brief Narrative: Brittney Shepard is a 69 y.o. 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 fib not on anticoagulation, history of diabetes mellitus, hypertension and an indwelling Foley catheter which was placed during her last hospitalization.  Patient was recently discharged from skilled nursing facility where she went for subacute rehab Patient presented to the ER via EMS for evaluation of rapid atrial fibrillation.  Per EMS patient was noted to have dirty urine and her Foley catheter had been in place for about a month.  Patient complains about having bladder spasms.  Per EMS she was noted to have heart rates between 160s to 200 and received 2.5 mg of IV metoprolol.  She was said to have become hypotensive after receiving the metoprolol and EMS administered 600 cc of fluid bolus.  Upon arrival to the ER her blood pressure had improved and was 115/83. Patient denies having any fever or chills, no chest pain, no abdominal pain or any changes in her bowel habits.  She complains of exertional shortness of breath as well as having the urge to void despite having a Foley catheter in place. In the ER she was still in A. fib with a rapid ventricular rate and her heart rate was between 130 and 150bpm. Labs reveal sodium 140, potassium 3.3, bicarb 29, glucose 169, BUN 19, creatinine 1.21, calcium 7.4, lactic acid 2.2 >> 1.7, white count 8.8, hemoglobin 11.8, hematocrit 37.5, platelet count 310  Chest x-ray reviewed by me shows no acute infiltrate or effusion. Twelve-lead EKG reviewed by me shows rapid A. fib. Patient is a 69 year old female who was brought to the ER by EMS for evaluation of palpitations and shortness of breath. She was noted to  be in rapid atrial fibrillation and received 2.5mg  of Metoprolol in the field with resulting hypotension requiring IVF resuscitation. She was noted to have dirty urine in her Foley catheter and CODE sepsis was called. Patient received IVF resuscitation as well as antibiotic therapy in the ER. She remains tachycardic with heart rate in the 150's and will be admitted to the hospital for further evaluation.  Assessment & Plan:   Principal Problem:   Atrial fibrillation with rapid ventricular response (HCC) Active Problems:   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HCC)   Essential hypertension   Type 2 diabetes mellitus without complication, without long-term current use of insulin (HCC)   Morbid obesity with BMI of 45.0-49.9, adult (HCC)   Hypokalemia Pt admitted with a.fib rvr found to have elevated lactic acid and source of uti, pt DOES NOT meet sepsis criteria on admission nor throughout this stay. Pt did present initially with elevated lactic and suspected  Source, pt was afebrile and no wbc count no organ dysfunction.   Atrial Fibrillation with a RVR: Patient with a known history of A Fib who presents with a RVR Continue Cardizem infusion for rate control Continue oral Cardizem and Atenolol Patient has a CHADS2VASC score of 5 and ideally requires long term anticoagulation as primary prophylaxis for an acute stroke . Patient declined long term anticoagulation and is only on Aspirin 81mg  Obtain TSH levels. Cardiology consulted-Greatly appreciate management and care.  Pt refuses Anticoagulation per cardiology documentation.   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   UTI: Patient has a chronic indwelling Foley catheter and has  pyuria. Her lactic acid level was elevated but shows a downward trend Will place patient on Rocephin 1g IV daily while awaiting culture results,which is chronic and  Rocephin d/c.  . Morbid obesity : Complicates overall prognosis and care.  Type 2 diabetes mellitus: Continue consistent carbohydrate  Place patient on Glycemic control with insulin.  Chronic Diastolic CHF: Continue scheduled Furosemide and Atenolol.  Hypokalemia: Secondary to diuretic therapy Supplement potassium.  Hypomagnesemia: replace 51m g iv x 1.   OSA: Autopap per resp, pt was not tolerated. Outpatient eval per pcp, nasal cpap consider oral appliance .  DVT prophylaxis:  Lovenox Code Status: Full code Family Communication: Greater than 50% of time was spent discussing plan of care with patient at the bedside. He verbalizes understanding and agrees with the plan Disposition Plan: Back to previous home environment Consults called: Cardiology-Dr.Fath.  Subjective: Pt is alert awake and comfortable denies any complaints.  Rt shin wound after hurting it in the walker.   Pt is alert and d/w her about PT consult and Home health. Suspect pt may need STR,.  Objective: Vitals:   12/25/19 0849 12/25/19 1209 12/25/19 1445 12/25/19 1600  BP:  (!) 115/56  (!) 100/56  Pulse:  86  85  Resp:  18  18  Temp:  97.8 F (36.6 C)  (!) 97.5 F (36.4 C)  TempSrc:      SpO2: 99% 95% 98% 98%  Weight:      Height:        Intake/Output Summary (Last 24 hours) at 12/25/2019 1818 Last data filed at 12/25/2019 1300 Gross per 24 hour  Intake 120 ml  Output 850 ml  Net -730 ml   Filed Weights   12/22/19 1108 12/23/19 0421 12/25/19 0433  Weight: 120.7 kg 126.3 kg 127.6 kg   Blood pressure (!) 100/56, pulse 85, temperature (!) 97.5 F (36.4 C), resp. rate 18, height 5\' 5"  (1.651 m), weight 127.6 kg, SpO2 98 %. Examination: General exam: Appears calm and comfortable  Respiratory system: Clear to auscultation.  Respiratory effort normal. Cardiovascular system: S1 & S2 heard, RRR. No JVD, murmurs, rubs, gallops or clicks. No pedal edema. Gastrointestinal system: Abdomen is nondistended, soft and nontender. No organomegaly or masses felt. Normal bowel sounds heard. Central nervous system: Alert and oriented. No focal neurological deficits. Extremities: Symmetric 5 x 5 power. Skin: No rashes, lesions or ulcers Psychiatry: Judgement and insight appear normal. Mood & affect appropriate.   Data Reviewed: I have personally reviewed following labs and imaging studies  CBC: Recent Labs  Lab 12/22/19 1104 12/23/19 1157 12/24/19 0738 12/25/19 0459  WBC 8.8 6.9 7.6 7.6  NEUTROABS 6.2  --  5.1 5.3  HGB 11.8* 11.2* 11.7* 10.9*  HCT 37.5 36.1 37.5 33.5*  MCV 86.2 88.9 87.6 85.0  PLT 310 265 175 093   Basic Metabolic Panel: Recent Labs  Lab 12/22/19 1104 12/22/19 1640 12/23/19 1157 12/24/19 0453 12/25/19 0459  NA 140  --  135 138 136  K 3.3*  --  3.7 3.8 3.3*  CL 99  --  97* 98 100  CO2 29  --  24 28 27   GLUCOSE 163*  --  130* 125* 128*  BUN 19  --  16 14 13   CREATININE 1.21*  --  1.04* 0.93 0.83  CALCIUM 7.4*  --  7.1* 7.4* 7.3*  MG  --  1.0* 1.5* 1.6* 1.9  PHOS  --   --   --  2.8 2.9   GFR: Estimated Creatinine Clearance: 83.6 mL/min (by C-G formula based on SCr of 0.83 mg/dL). Liver Function Tests: Recent Labs  Lab 12/22/19 1104 12/24/19 0453 12/25/19 0459  AST 23 22 18   ALT 10 9 8   ALKPHOS 73 64 63  BILITOT 1.2 1.0 0.9  PROT 6.6 5.9* 5.8*  ALBUMIN 3.1* 2.5* 2.6*   No results for input(s): LIPASE, AMYLASE in the last 168 hours. No results for input(s): AMMONIA in the last 168 hours. Coagulation Profile: Recent Labs  Lab 12/22/19 1104  INR 1.2   Cardiac Enzymes: No results for input(s): CKTOTAL, CKMB, CKMBINDEX, TROPONINI in the last 168 hours. BNP (last 3 results) No results for input(s): PROBNP in the last 8760 hours. HbA1C: No results for input(s): HGBA1C in the  last 72 hours. CBG: Recent Labs  Lab 12/24/19 1626 12/24/19 2134 12/25/19 0809 12/25/19 1207 12/25/19 1559  GLUCAP 126* 132* 131* 123* 136*   Lipid Profile: No results for input(s): CHOL, HDL, LDLCALC, TRIG, CHOLHDL, LDLDIRECT in the last 72 hours. Thyroid Function Tests: No results for input(s): TSH, T4TOTAL, FREET4, T3FREE, THYROIDAB in the last 72 hours. Anemia Panel: No results for input(s): VITAMINB12, FOLATE, FERRITIN, TIBC, IRON, RETICCTPCT in the last 72 hours. Sepsis Labs: Recent Labs  Lab 12/22/19 1104 12/22/19 1311  LATICACIDVEN 2.2* 1.7    Recent Results (from the past 240 hour(s))  Blood Culture (routine x 2)     Status: None (Preliminary result)   Collection Time: 12/22/19 11:04 AM   Specimen: BLOOD  Result Value Ref Range Status   Specimen Description BLOOD RIGHT FOREARM  Final   Special Requests   Final    BOTTLES DRAWN AEROBIC AND ANAEROBIC Blood Culture adequate volume   Culture   Final    NO GROWTH 3 DAYS Performed at [facility], [address], [place], [place] [address]    Report Status PENDING  Incomplete  Blood Culture (routine x 2)     Status: None (Preliminary result)   Collection Time: 12/22/19 11:04 AM   Specimen: BLOOD  Result Value Ref Range Status   Specimen Description BLOOD RIGHT FOREARM  Final   Special Requests   Final    BOTTLES DRAWN AEROBIC AND ANAEROBIC Blood Culture results may not be optimal due to an inadequate volume of blood received in culture bottles   Culture   Final    NO GROWTH 3 DAYS Performed at [facility], [address], [place], [place] [address]    Report Status PENDING  Incomplete  SARS Coronavirus 2 by RT PCR (hospital order, performed in [place] hospital lab) Nasopharyngeal Nasopharyngeal Swab     Status: None   Collection Time: 12/22/19 11:12 AM   Specimen: Nasopharyngeal Swab  Result Value Ref Range Status   SARS Coronavirus 2 NEGATIVE NEGATIVE Final    Comment:  (NOTE) SARS-CoV-2 target nucleic acids are NOT DETECTED.  The SARS-CoV-2 RNA is generally detectable in upper and lower respiratory specimens during the acute phase of infection. The lowest concentration of SARS-CoV-2 viral copies this assay can detect is 250 copies / mL. A negative result does not preclude SARS-CoV-2 infection and should not be used as the sole basis for treatment or  other patient management decisions.  A negative result may occur with improper specimen collection / handling, submission of specimen other than nasopharyngeal swab, presence of viral mutation(s) within the areas targeted by this assay, and inadequate number of viral copies (<250 copies / mL). A negative result must be combined with clinical observations, patient history, and epidemiological information.  Fact Sheet for Patients:   StrictlyIdeas.no  Fact Sheet for Healthcare Providers: BankingDealers.co.za  This test is not yet approved or  cleared by the [place] FDA and has been authorized for detection and/or diagnosis of SARS-CoV-2 by FDA under an Emergency Use Authorization (EUA).  This EUA will remain in effect (meaning this test can be used) for the duration of the COVID-19 declaration under Section 564(b)(1) of the Act, 21 U.S.C. section 360bbb-3(b)(1), unless the authorization is terminated or revoked sooner.  Performed at [facility], [address] [place], [place] [address]   Urine culture     Status: Abnormal   Collection Time: 12/22/19 11:51 AM   Specimen: In/Out Cath Urine  Result Value Ref Range Status   Specimen Description   Final    IN/OUT CATH URINE Performed at [facility], [address], [place], [place] [address]    Special Requests   Final    NONE Performed at [facility], [place], [place] [address]    Culture 20,000 COLONIES/mL ENTEROCOCCUS FAECIUM (A)  Final    Report Status 12/25/2019 FINAL  Final   Organism ID, Bacteria ENTEROCOCCUS FAECIUM (A)  Final      Susceptibility   Enterococcus faecium - MIC*    AMPICILLIN <=2 SENSITIVE Sensitive     NITROFURANTOIN 64 INTERMEDIATE Intermediate     VANCOMYCIN <=0.5 SENSITIVE Sensitive     * 20,000 COLONIES/mL ENTEROCOCCUS FAECIUM     Radiology Studies: No results found.   Scheduled Meds: . apixaban  5 mg Oral BID  . atenolol  12.5 mg Oral Daily  . Chlorhexidine Gluconate Cloth  6 each Topical Daily  . diltiazem  60 mg Oral Q12H  . furosemide  40 mg Oral BID  . insulin aspart  0-20 Units Subcutaneous TID WC  . ipratropium-albuterol  3 mL Nebulization TID  . multivitamin with minerals  1 tablet Oral Daily  . pneumococcal 23 valent vaccine  0.5 mL Intramuscular Tomorrow-1000  . potassium chloride  20 mEq Oral BID  . senna-docusate  1 tablet Oral BID  . sodium chloride flush  3 mL Intravenous Q12H   Continuous Infusions: . sodium chloride       LOS: 3 days    Para Skeans, MD [facility] Pager [phone] If 7PM-7AM, please contact night-coverage [email or web address] Password [email or web address] 12/25/2019, 6:18 PM

## 2019-12-26 DIAGNOSIS — I4819 Other persistent atrial fibrillation: Secondary | ICD-10-CM

## 2019-12-26 LAB — CBC WITH DIFFERENTIAL/PLATELET
Abs Immature Granulocytes: 0.03 10*3/uL (ref 0.00–0.07)
Basophils Absolute: 0.1 10*3/uL (ref 0.0–0.1)
Basophils Relative: 1 %
Eosinophils Absolute: 0.5 10*3/uL (ref 0.0–0.5)
Eosinophils Relative: 7 %
HCT: 32.4 % — ABNORMAL LOW (ref 36.0–46.0)
Hemoglobin: 10.3 g/dL — ABNORMAL LOW (ref 12.0–15.0)
Immature Granulocytes: 0 %
Lymphocytes Relative: 17 %
Lymphs Abs: 1.2 10*3/uL (ref 0.7–4.0)
MCH: 27.2 pg (ref 26.0–34.0)
MCHC: 31.8 g/dL (ref 30.0–36.0)
MCV: 85.5 fL (ref 80.0–100.0)
Monocytes Absolute: 0.6 10*3/uL (ref 0.1–1.0)
Monocytes Relative: 9 %
Neutro Abs: 4.7 10*3/uL (ref 1.7–7.7)
Neutrophils Relative %: 66 %
Platelets: 240 10*3/uL (ref 150–400)
RBC: 3.79 MIL/uL — ABNORMAL LOW (ref 3.87–5.11)
RDW: 15.8 % — ABNORMAL HIGH (ref 11.5–15.5)
WBC: 7.1 10*3/uL (ref 4.0–10.5)
nRBC: 0 % (ref 0.0–0.2)

## 2019-12-26 LAB — COMPREHENSIVE METABOLIC PANEL
ALT: 11 U/L (ref 0–44)
AST: 21 U/L (ref 15–41)
Albumin: 2.6 g/dL — ABNORMAL LOW (ref 3.5–5.0)
Alkaline Phosphatase: 66 U/L (ref 38–126)
Anion gap: 9 (ref 5–15)
BUN: 15 mg/dL (ref 8–23)
CO2: 28 mmol/L (ref 22–32)
Calcium: 7.4 mg/dL — ABNORMAL LOW (ref 8.9–10.3)
Chloride: 100 mmol/L (ref 98–111)
Creatinine, Ser: 0.87 mg/dL (ref 0.44–1.00)
GFR calc Af Amer: >60 mL/min (ref >60)
GFR calc non Af Amer: >60 mL/min (ref >60)
Glucose, Bld: 159 mg/dL — ABNORMAL HIGH (ref 70–99)
Potassium: 4 mmol/L (ref 3.5–5.1)
Sodium: 137 mmol/L (ref 135–145)
Total Bilirubin: 0.7 mg/dL (ref 0.3–1.2)
Total Protein: 5.7 g/dL — ABNORMAL LOW (ref 6.5–8.1)

## 2019-12-26 LAB — GLUCOSE, CAPILLARY
Glucose-Capillary: 140 mg/dL — ABNORMAL HIGH (ref 70–99)
Glucose-Capillary: 155 mg/dL — ABNORMAL HIGH (ref 70–99)
Glucose-Capillary: 145 mg/dL — ABNORMAL HIGH (ref 70–99)
Glucose-Capillary: 156 mg/dL — ABNORMAL HIGH (ref 70–99)

## 2019-12-26 LAB — PHOSPHORUS: Phosphorus: 2.8 mg/dL (ref 2.5–4.6)

## 2019-12-26 LAB — MAGNESIUM: Magnesium: 1.9 mg/dL (ref 1.7–2.4)

## 2019-12-26 MED ORDER — IPRATROPIUM-ALBUTEROL 0.5-2.5 (3) MG/3ML IN SOLN
3.0000 mL | Freq: Two times a day (BID) | RESPIRATORY_TRACT | Status: DC
  Administered 2019-12-26 – 2019-12-27 (×3): 3 mL via RESPIRATORY_TRACT
  Filled 2019-12-26 (×3): qty 3

## 2019-12-26 MED ORDER — FOSFOMYCIN TROMETHAMINE 3 G PO PACK
3.0000 g | PACK | Freq: Once | ORAL | Status: AC
  Administered 2019-12-26: 3 g via ORAL
  Filled 2019-12-26: qty 3

## 2019-12-26 MED ORDER — APIXABAN 2.5 MG PO TABS
2.5000 mg | ORAL_TABLET | Freq: Two times a day (BID) | ORAL | Status: DC
  Administered 2019-12-26 – 2019-12-27 (×2): 2.5 mg via ORAL
  Filled 2019-12-26 (×2): qty 1

## 2019-12-26 NOTE — Progress Notes (Signed)
Physical Therapy Treatment Patient Details Name: Brittney Shepard MRN: [id number] DOB: Feb 27, 1951 Today's Date: 12/26/2019    History of Present Illness Per MD note:Brittney Shepard is a 69 y.o. 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 fib not on anticoagulation, history of diabetes mellitus, hypertension and an indwelling Foley catheter which was placed during her last hospitalization.  Patient was recently discharged from skilled nursing facility where she went for subacute rehab    PT Comments    Pt was long sitting in bed upon arriving. She agrees to PT session and is cooperative and pleasant throughout. Upon arriving, she request to get [facility]. She was able to exit R side of bed without assistance however required mod assist to progress from short sit back into bed. Unable to lift BLEs into bed. She could STS from EOB and from elevated toilet height with min assist. No LOB with ambulation but does require min assist for safety. Pt fatigues quickly with elevated HR with minimal activity. RN aware. She was in bed post session with call bell in reach and bed alarm set.    Follow Up Recommendations  SNF     Equipment Recommendations  None recommended by PT    Recommendations for Other Services       Precautions / Restrictions Precautions Precautions: Fall Precaution Comments: high fall risk Restrictions Weight Bearing Restrictions: No    Mobility  Bed Mobility Overal bed mobility: Needs Assistance Bed Mobility: Supine to Sit     Supine to sit: Supervision Sit to supine: Mod assist   General bed mobility comments: HOB was elevated. increased time to get OOB but was able to perform without assistance. she does however require mod assist to return to bed after OOB activity  Transfers Overall transfer  level: Needs assistance Equipment used: Rolling walker (2 wheeled) Transfers: Sit to/from Stand Sit to Stand: Min assist         General transfer comment: Min assist to STS from EOB and from [facility] over toilet.  Ambulation/Gait Ambulation/Gait assistance: Min assist Gait Distance (Feet): 15 Feet Assistive device: Rolling walker (2 wheeled) Gait Pattern/deviations: Trunk flexed;Step-through pattern Gait velocity: decreased   General Gait Details: Pt was able to ambulate to BR and return 2 x 15. no LOB however HR did elevated from 115 bpm to 150s bpm.    Stairs             Wheelchair Mobility    Modified Rankin (Stroke Patients Only)       Balance Overall balance assessment: Needs assistance Sitting-balance support: Feet supported;No upper extremity supported Sitting balance-Leahy Scale: Good Sitting balance - Comments: no LOB in sitting   Standing balance support: Bilateral upper extremity supported Standing balance-Leahy Scale: Fair                              Cognition Arousal/Alertness: Awake/alert Behavior During Therapy: WFL for tasks assessed/performed Overall Cognitive Status: Within Functional Limits for tasks assessed                                 General Comments: Pt is A and O x 4. pleasant and cooperative      Exercises  General Comments        Pertinent Vitals/Pain Pain Assessment: 0-10 Pain Score: 3  Faces Pain Scale: Hurts a little bit Pain Location: bladder/sphincter (when urinating) Pain Descriptors / Indicators: Burning Pain Intervention(s): Limited activity within patient's tolerance;Monitored during session;Premedicated before session;Repositioned    Home Living                      Prior Function            PT Goals (current goals can now be found in the care plan section) Acute Rehab PT Goals Patient Stated Goal: to get stronger Progress towards PT goals: Progressing toward  goals    Frequency    Min 2X/week      PT Plan Current plan remains appropriate    Co-evaluation              AM-PAC PT "6 Clicks" Mobility   Outcome Measure  Help needed turning from your back to your side while in a flat bed without using bedrails?: A Little Help needed moving from lying on your back to sitting on the side of a flat bed without using bedrails?: A Lot Help needed moving to and from a bed to a chair (including a wheelchair)?: A Lot Help needed standing up from a chair using your arms (e.g., wheelchair or bedside chair)?: A Lot Help needed to walk in hospital room?: A Lot Help needed climbing 3-5 steps with a railing? : A Lot 6 Click Score: 13    End of Session Equipment Utilized During Treatment: Gait belt Activity Tolerance: Patient limited by fatigue;Patient tolerated treatment well Patient left: in bed;with call bell/phone within reach;with bed alarm set Nurse Communication: Mobility status PT Visit Diagnosis: Unsteadiness on feet (R26.81);Muscle weakness (generalized) (M62.81);History of falling (Z91.81);Difficulty in walking, not elsewhere classified (R26.2)     Time: 5697-9480 PT Time Calculation (min) (ACUTE ONLY): 23 min  Charges:  $Gait Training: 8-22 mins $Therapeutic Activity: 8-22 mins                     Julaine Fusi PTA 12/26/19, 12:23 PM

## 2019-12-26 NOTE — Progress Notes (Signed)
Progress Note  Patient Name: Brittney Shepard Date of Encounter: 12/26/2019  Primary Cardiologist: Kate Sable, MD  Subjective   No chest pain, shortness of breath, or palpitations. Ongoing dysuria.  Inpatient Medications    Scheduled Meds: . apixaban  5 mg Oral BID  . atenolol  12.5 mg Oral Daily  . Chlorhexidine Gluconate Cloth  6 each Topical Daily  . diltiazem  60 mg Oral Q12H  . furosemide  40 mg Oral BID  . insulin aspart  0-20 Units Subcutaneous TID WC  . ipratropium-albuterol  3 mL Nebulization BID  . multivitamin with minerals  1 tablet Oral Daily  . pneumococcal 23 valent vaccine  0.5 mL Intramuscular Tomorrow-1000  . potassium chloride  20 mEq Oral BID  . senna-docusate  1 tablet Oral BID  . sodium chloride flush  3 mL Intravenous Q12H   Continuous Infusions: . sodium chloride     PRN Meds: sodium chloride, acetaminophen, ondansetron (ZOFRAN) IV, polyethylene glycol, sodium chloride flush   Vital Signs    Vitals:   12/26/19 0828 12/26/19 0933 12/26/19 1000 12/26/19 1217  BP:   114/72 103/71  Pulse:  (!) 105 72 98  Resp: 17 20  18   Temp:    98 F (36.7 C)  TempSrc:      SpO2:  95%  99%  Weight:      Height:        Intake/Output Summary (Last 24 hours) at 12/26/2019 1353 Last data filed at 12/26/2019 0847 Gross per 24 hour  Intake 240 ml  Output 450 ml  Net -210 ml   Filed Weights   12/23/19 0421 12/25/19 0433 12/26/19 0338  Weight: 126.3 kg 127.6 kg 130.2 kg    Physical Exam   GEN: Obese, in no acute distress.  HEENT: Grossly normal with exception of poor dentition. Neck: Supple, obese, difficult to gauge JVP. No carotid bruits, or masses. Cardiac: Irregularly irregular, tachycardic, distant, no murmurs, rubs, or gallops. No clubbing, cyanosis, edema.  Radials/DP/PT 1+ and equal bilaterally.  Respiratory:  Respirations regular and unlabored, clear to auscultation bilaterally. GI: Obese, nontender, nondistended, BS + x 4. MS: no  deformity or atrophy. Skin: warm and dry, no rash. Neuro:  Strength and sensation are intact. Psych: AAOx3.  Normal affect.  Labs    Chemistry Recent Labs  Lab 12/24/19 0453 12/25/19 0459 12/26/19 0459  NA 138 136 137  K 3.8 3.3* 4.0  CL 98 100 100  CO2 28 27 28   GLUCOSE 125* 128* 159*  BUN 14 13 15   CREATININE 0.93 0.83 0.87  CALCIUM 7.4* 7.3* 7.4*  PROT 5.9* 5.8* 5.7*  ALBUMIN 2.5* 2.6* 2.6*  AST 22 18 21   ALT 9 8 11   ALKPHOS 64 63 66  BILITOT 1.0 0.9 0.7  GFRNONAA >60 >60 >60  GFRAA >60 >60 >60  ANIONGAP 12 9 9      Hematology Recent Labs  Lab 12/24/19 0738 12/25/19 0459 12/26/19 0459  WBC 7.6 7.6 7.1  RBC 4.28 3.94 3.79*  HGB 11.7* 10.9* 10.3*  HCT 37.5 33.5* 32.4*  MCV 87.6 85.0 85.5  MCH 27.3 27.7 27.2  MCHC 31.2 32.5 31.8  RDW 15.7* 15.7* 15.8*  PLT 175 245 240    Cardiac Enzymes  Recent Labs  Lab 12/22/19 1640 12/22/19 1920  TROPONINIHS 3 3      Lipids  Lab Results  Component Value Date   CHOL 120 03/22/2016   HDL 29 (L) 03/22/2016   LDLCALC 71 03/22/2016  TRIG 102 03/22/2016   CHOLHDL 4.1 03/22/2016    HbA1c  Lab Results  Component Value Date   HGBA1C 6.2 (H) 11/18/2019    Radiology    No results found.  Telemetry    Afib, low 100's - Personally Reviewed  Cardiac Studies   2D Echocardiogram 7.18.2021  1. Left ventricular ejection fraction, by estimation, is >55%. The left  ventricle has normal function. Left ventricular endocardial border not  optimally defined to evaluate regional wall motion. There is severe left  ventricular hypertrophy. Left  ventricular diastolic function could not be evaluated.  2. Right ventricular systolic function is normal. The right ventricular  size is not well visualized. Tricuspid regurgitation signal is inadequate  for assessing PA pressure.  3. The mitral valve is grossly normal. Trivial mitral valve  regurgitation.  4. The aortic valve is grossly normal. Aortic valve  regurgitation not  well assessed.   Patient Profile     69 y.o.femalewith history of permanent A. fib, DM 2, bladder tumor status post TURBT in July 2021 with persistent indwelling Foley catheter. Multiple hospitalizations-July 17 was noted to have a fall. Was in A. fib RVR and rate control beta-blocker and calcium blockers. She declined anticoagulation and chose to take the option of aspirin because of falls. CHA2DS2-VASc score determined to be 5. ->At this stage, she was diagnosed with bladder tumor undergoing treatment with Foley cath placed. This continues to prevent anticoagulation at this time. *Clinic follow-up on 12/08/2019 Dr.Agbor-Etang decreased atenolol to 25 mg because of bradycardia. Again aspirin continued. She now presented on August 20 to [facility] with report of discolored the urine output from Foley. Also had a recent fall with wound to the right leg. She is noted significant decreased energy and was found to be in rapid A. fib upon arrival. Was borderline hypotensive blood pressure 89/53 while in A. fib with RVR rate 100 bpm. Mild lactic acidosis. Sepsis protocol activated. She was noted to be mildly hypokalemic and hypomagnesemic. Mildly reduced renal function with creatinine 1.2. >Cardiology consulted for assistance with management of A. fib.  Assessment & Plan    1. Permanent atrial fibrillation with rapid ventricular response: Presented August 20 with urosepsis and in that setting, heart rate is elevated on top of permanent atrial fibrillation. Notably, she previously required reduction of atenolol dose in the outpatient setting due to bradycardia (reduced to 25mg  daily). She is currently on atenolol 12.5 mg daily with diltiazem 60 mg every 12 hours, with doses being limited by blood pressure. Heart rates are trending mostly in the low 100s though occasionally rates will jump into the 120s or 130s. Blood pressures relatively stable over the past 24 hours with a  tendency to drop into the low 100s. Given prior history of bradycardia, will continue current doses of beta-blocker and calcium channel blocker. Hopefully, with improvement in clinical status related to urosepsis, rates will improve. Provided that blood pressures are stable, we can look to consolidate her diltiazem to CD-120 mg daily, and potentially increase atenolol back to 25 mg daily. CHA2DS2-VASc equals 5. Eliquis was initiated on August 23 after she agreed to try.  2. HFpEF: Exacerbated by rapid A. fib. She was -660 overnight on her home dose of Lasix. She appears relatively euvolemic on examination. Not sure that weights are accurate. Continue beta-blocker, calcium channel blocker, and current dose of Lasix.  3. Essential hypertension: Stable to soft. Continue current medications.  4. Hypomagnesemia/hypokalemia: Both potassium and magnesium are normal this morning.  5.  Mild acute kidney injury: Resolved.  6. Normocytic anemia: Stable.  7. Urosepsis: Steadily improving though still with dysuria. Per internal medicine.  8. Diabetes mellitus 2: A1c 6.2 in July. Per internal medicine.  Signed, Murray Hodgkins, NP  12/26/2019, 1:53 PM    For questions or updates, please contact   Please consult [email or web address] for contact info under Cardiology/STEMI.

## 2019-12-26 NOTE — Progress Notes (Signed)
Patient's urine appears more blood tinged than earlier. No clots present. MD made aware. Patient on PO eliquis 5 mg as of this morning. Order decreased to 2.5 mg. MD returned page. Patient's urologist and oncologist contacted by MD. Patient advised to drink more fluids.

## 2019-12-26 NOTE — Consult Note (Signed)
Hematology/Oncology Consult note [facility] [phone] [phone]  Patient Care Team: Marguerita Merles, MD as PCP - General (Family Medicine) Kate Sable, MD as PCP - Cardiology (Cardiology) Clent Jacks, RN as Oncology Nurse Navigator   Name of the patient: Brittney Shepard  [id number]  1950/07/05    Reason for consult: 1. Liver metastases of unknown primary possibly endometrial cancer admitted for rapid A. fib   Requesting physician Dr. Florina Ou  Date of visit:12/26/2019    History of presenting illness-patient is a 69 year old female who was recently diagnosed with nonmuscle invasive bladder cancer in July 2021 s/p TURBT and has a chronic Foley catheter in place. Work-up at that time also showed hypermetabolic liver mass 4.9 cm in size as well as intra-abdominal adenopathy and endometrial mass that was hypermetabolic on PET scan concerning for metastatic endometrial cancer. She also has a anterior mediastinal mass which was not PET avid and etiology of that is unclear. She was not deemed to be a candidate for endometrial biopsy under anesthesia. 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 There is also positive for CK19 which can be seen in pancreas, bladder, endometrium, lung and kidney cancers. Plan was to proceed with weekly carbotaxol chemotherapy based on her performance status and she has not started any treatment yet. NGS testing did not show any actionable mutations.  Patient was at a subacute rehab and recently discharged home.She was readmitted for evaluation of rapid atrial fibrillation in the setting of UTI. This is currently rate controlled with atenolol and diltiazem. Patient is also on Eliquis. Oncology consulted for concerns of hematuria   ECOG PS- 2  Pain scale- 0   Review of systems- Review of  Systems  Constitutional: Positive for malaise/fatigue. Negative for chills, fever and weight loss.  HENT: Negative for congestion, ear discharge and nosebleeds.   Eyes: Negative for blurred vision.  Respiratory: Negative for cough, hemoptysis, sputum production, shortness of breath and wheezing.   Cardiovascular: Negative for chest pain, palpitations, orthopnea and claudication.  Gastrointestinal: Negative for abdominal pain, blood in stool, constipation, diarrhea, heartburn, melena, nausea and vomiting.  Genitourinary: Negative for dysuria, flank pain, frequency, hematuria and urgency.  Musculoskeletal: Negative for back pain, joint pain and myalgias.  Skin: Negative for rash.  Neurological: Negative for dizziness, tingling, focal weakness, seizures, weakness and headaches.  Endo/Heme/Allergies: Does not bruise/bleed easily.  Psychiatric/Behavioral: Negative for depression and suicidal ideas. The patient does not have insomnia.     Allergies  Allergen Reactions  . Penicillin G Hives  . Sulfa Antibiotics Rash    Patient Active Problem List   Diagnosis Date Noted  . Hypokalemia 12/22/2019  . Endometrial cancer ([place]) 12/20/2019  . Goals of care, counseling/discussion 12/04/2019  . 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 Weakness   . Liver metastases ([place])   . Essential hypertension   . Type 2 diabetes mellitus without complication, without long-term current use of insulin ([place])   . Morbid obesity with BMI of 45.0-49.9, adult ([place])   . Atrial fibrillation ([place]) 11/19/2019  . Hematuria, gross   . Postmenopausal bleeding   . Abnormal endometrial ultrasound   . Atrial fibrillation with RVR ([place]) 11/18/2019  . Bladder mass   . Fall   . Acute cystitis without hematuria   . Chronic diastolic CHF (congestive heart failure) ([place]) 05/27/2016  . Primary osteoarthritis of both knees  05/27/2016  . Progressive anemia 05/27/2016  . DNR (do not resuscitate)   . Palliative care by  specialist   . Acute respiratory failure with hypoxia and hypercapnia ([place])   . Atrial fibrillation with rapid ventricular response ([place]) 03/20/2016  . Chronic obstructive pulmonary disease with acute exacerbation (HCC)      Past Medical History:  Diagnosis Date  . A-fib ([place])   . Bladder cancer ([place])   . CHF (congestive heart failure) ([place])   . Chronic pain   . Cognitive communication deficit   . Diabetes mellitus without complication ([place])   . Hypertension   . Hypokalemia   . Muscle weakness (generalized)   . 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 Secondary malignant neoplasm of liver ([place])   . Stroke [facility]) 2013  . Urinary tract infection, site not specified      Past Surgical History:  Procedure Laterality Date  . CYSTOSCOPY W/ RETROGRADES N/A 11/20/2019   Procedure: CYSTOSCOPY WITH RETROGRADE PYELOGRAM;  Surgeon: Billey Co, MD;  Location: [facility] ORS;  Service: Urology;  Laterality: N/A;  . CYSTOSCOPY WITH STENT PLACEMENT Left 11/20/2019   Procedure: CYSTOSCOPY WITH STENT PLACEMENT;  Surgeon: Billey Co, MD;  Location: [facility] ORS;  Service: Urology;  Laterality: Left;  . TRANSURETHRAL RESECTION OF BLADDER TUMOR N/A 11/20/2019   Procedure: TRANSURETHRAL RESECTION OF BLADDER TUMOR (TURBT);  Surgeon: Billey Co, MD;  Location: [facility] ORS;  Service: Urology;  Laterality: N/A;    Social History   Socioeconomic History  . Marital status: Widowed    Spouse name: Not on file  . Number of children: Not on file  . Years of education: Not on file  . Highest education level: Not on file  Occupational History  . Not on file  Tobacco Use  . Smoking status: Never Smoker  . Smokeless tobacco: Never Used  Substance and Sexual Activity  . Alcohol use: No  . Drug use: No  . Sexual activity: Not Currently  Other Topics Concern  . Not on file  Social History Narrative   Retd. 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 Has one son.     Social Determinants of Health   Financial Resource Strain:   . Difficulty of Paying Living Expenses: Not on file  Food Insecurity:   . Worried About Charity fundraiser in the Last Year: Not on file  . Ran Out of Food in the Last Year: Not on file  Transportation Needs:   . Lack of Transportation (Medical): Not on file  . Lack of Transportation (Non-Medical): Not on file  Physical Activity:   . Days of Exercise per Week: Not on file  . Minutes of Exercise per Session: Not on file  Stress:   . Feeling of Stress : Not on file  Social Connections:   . Frequency of Communication with Friends and Family: Not on file  . Frequency of Social Gatherings with Friends and Family: Not on file  . Attends Religious Services: Not on file  . Active Member of Clubs or Organizations: Not on file  . Attends Archivist Meetings: Not on file  . Marital Status: Not on file  Intimate Partner Violence:   . Fear of Current or Ex-Partner: Not on file  . Emotionally Abused: Not on file  . Physically Abused: Not on file  . Sexually Abused: Not on file     Family History  Problem Relation Age of Onset  .  Ovarian cancer Mother   . Cancer Sister      Current Facility-Administered Medications:  .  0.9 %  sodium chloride infusion, 250 mL, Intravenous, PRN, Agbata, Tochukwu, MD .  acetaminophen (TYLENOL) tablet 650 mg, 650 mg, Oral, Q6H PRN, Agbata, Tochukwu, MD .  apixaban (ELIQUIS) tablet 5 mg, 5 mg, Oral, BID, Theora Gianotti, NP, 5 mg at 12/26/19 1016 .  atenolol (TENORMIN) tablet 12.5 mg, 12.5 mg, Oral, Daily, Florina Ou V, MD, 12.5 mg at 12/26/19 1017 .  Chlorhexidine Gluconate Cloth 2 % PADS 6 each, 6 each, Topical, Daily, Agbata, Tochukwu, MD, 6 each at 12/26/19 1447 .  diltiazem (CARDIZEM SR) 12 hr capsule 60 mg, 60 mg, Oral, Q12H, Agbata, Tochukwu, MD, 60 mg at 12/26/19 1017 .  furosemide (LASIX) tablet 40 mg, 40 mg, Oral, BID, Agbata, Tochukwu, MD, 40 mg at 12/26/19  0843 .  insulin aspart (novoLOG) injection 0-20 Units, 0-20 Units, Subcutaneous, TID WC, Agbata, Tochukwu, MD, 4 Units at 12/26/19 1241 .  ipratropium-albuterol (DUONEB) 0.5-2.5 (3) MG/3ML nebulizer solution 3 mL, 3 mL, Nebulization, BID, Florina Ou V, MD, 3 mL at 12/26/19 0932 .  multivitamin with minerals tablet 1 tablet, 1 tablet, Oral, Daily, Agbata, Tochukwu, MD, 1 tablet at 12/26/19 1016 .  ondansetron (ZOFRAN) injection 4 mg, 4 mg, Intravenous, Q6H PRN, Agbata, Tochukwu, MD .  pneumococcal 23 valent vaccine (PNEUMOVAX-23) injection 0.5 mL, 0.5 mL, Intramuscular, Tomorrow-1000, [place], Elizabeth Achieng, NP .  polyethylene glycol (MIRALAX / GLYCOLAX) packet 17 g, 17 g, Oral, Daily PRN, Agbata, Tochukwu, MD .  potassium chloride SA (KLOR-CON) CR tablet 20 mEq, 20 mEq, Oral, BID, Theora Gianotti, NP, 20 mEq at 12/26/19 1016 .  senna-docusate (Senokot-S) tablet 1 tablet, 1 tablet, Oral, BID, Agbata, Tochukwu, MD, 1 tablet at 12/26/19 1016 .  sodium chloride flush (NS) 0.9 % injection 3 mL, 3 mL, Intravenous, Q12H, Agbata, Tochukwu, MD, 3 mL at 12/26/19 1020 .  sodium chloride flush (NS) 0.9 % injection 3 mL, 3 mL, Intravenous, PRN, Collier Bullock, MD   Physical exam:  Vitals:   12/26/19 0828 12/26/19 0933 12/26/19 1000 12/26/19 1217  BP:   114/72 103/71  Pulse:  (!) 105 72 98  Resp: 17 20  18   Temp:    98 F (36.7 C)  TempSrc:      SpO2:  95%  99%  Weight:      Height:       Physical Exam Constitutional:      General: She is not in acute distress.    Appearance: She is obese.  Cardiovascular:     Rate and Rhythm: Regular rhythm. Tachycardia present.     Heart sounds: Normal heart sounds.  Pulmonary:     Effort: Pulmonary effort is normal.     Breath sounds: Normal breath sounds.  Abdominal:     Palpations: Abdomen is soft.     Comments: Foley draining dark/ blood tinged urine but no gross hematuria noted in the foley tubing or catheter  Skin:    General: Skin is  warm and dry.  Neurological:     Mental Status: She is alert and oriented to person, place, and time.        CMP Latest Ref Rng & Units 12/26/2019  Glucose 70 - 99 mg/dL 159(H)  BUN 8 - 23 mg/dL 15  Creatinine 0.44 - 1.00 mg/dL 0.87  Sodium 135 - 145 mmol/L 137  Potassium 3.5 - 5.1 mmol/L 4.0  Chloride 98 - 111  mmol/L 100  CO2 22 - 32 mmol/L 28  Calcium 8.9 - 10.3 mg/dL 7.4(L)  Total Protein 6.5 - 8.1 g/dL 5.7(L)  Total Bilirubin 0.3 - 1.2 mg/dL 0.7  Alkaline Phos 38 - 126 U/L 66  AST 15 - 41 U/L 21  ALT 0 - 44 U/L 11   CBC Latest Ref Rng & Units 12/26/2019  WBC 4.0 - 10.5 K/uL 7.1  Hemoglobin 12.0 - 15.0 g/dL 10.3(L)  Hematocrit 36 - 46 % 32.4(L)  Platelets 150 - 400 K/uL 240    @IMAGES @  NM PET Image Initial (PI) Skull Base To Thigh  Result Date: 12/12/2019 CLINICAL DATA:  Initial treatment strategy for metastatic disease of unknown primary. Prior trans urethral resection of bladder tumor 11/19/2019. Biopsy of liver 11/22/2019. EXAM: NUCLEAR MEDICINE PET SKULL BASE TO THIGH TECHNIQUE: 14.4 mCi F-18 FDG was injected intravenously. Full-ring PET imaging was performed from the skull base to thigh after the radiotracer. CT data was obtained and used for attenuation correction and anatomic localization. Fasting blood glucose: 137 mg/dl COMPARISON:  Chest CT 11/22/2019. Abdominopelvic MRI 11/21/2019. Abdominopelvic CT of 11/20/2019. FINDINGS: Mediastinal blood pool activity: SUV max 3.3 Liver activity: SUV max NA NECK: No areas of abnormal hypermetabolism. Incidental CT findings: Cerebral atrophy.  No cervical adenopathy. CHEST: No pulmonary parenchymal or thoracic nodal hypermetabolism. No hypermetabolism to correspond to the previously described macrolobulated anterior mediastinal mass. 4.3 x 3.1 cm and 14.5 HU on 80/3. Incidental CT findings: Deferred to recent diagnostic CT. Cardiomegaly. Aortic and coronary artery atherosclerosis. ABDOMEN/PELVIS: Segment 4 B hypermetabolic liver mass  measures 4.9 x 3.9 cm and a S.U.V. max of 22.0 on 145/3. Porta hepatis nodal metastasis at 1.2 cm and a S.U.V. max of 10.2 on 135/3. More equivocal abdominal retroperitoneal nodes, including at 1.1 cm and a S.U.V. max of 3.6 in the pre caval space on 151/3. No pelvic nodal hypermetabolism. Hypermetabolism corresponding to endometrial soft tissue fullness and heterogeneity. Example at on the order of 4.5 cm and a S.U.V. max of 14.1 on 220/3. The bladder is not well evaluated secondary to physiologic urinary hypermetabolism. There is also urinary contamination about the perineum. Incidental CT findings: Deferred to recent CT and MRI. Abdominal aortic atherosclerosis. Foley catheter within the urinary bladder. Left ureteric stent without significant hydronephrosis. SKELETON: No abnormal marrow activity. Incidental CT findings: none IMPRESSION: 1. Hypermetabolic endometrial mass, consistent with carcinoma. 2. Metastatic disease to the liver and upper abdominal node or nodes as detailed above. Given the distribution of metastasis, and the extent of endometrial hypermetabolism, endometrial primary is favored. 3. No thoracic hypermetabolic metastasis. The macrolobulated anterior mediastinal mass is not significantly hypermetabolic. Favor pericardial or bronchogenic cyst. Thymoma or less likely thymic carcinoma cannot be entirely excluded but are felt less likely. Given comorbidities, of questionable clinical significance. Consider chest CT follow-up at 3 months. Electronically Signed   By: Abigail Miyamoto M.D.   On: 12/12/2019 14:46   DG Chest Port 1 View  Result Date: 12/22/2019 CLINICAL DATA:  69 year old female with possible sepsis. Atrial fibrillation with RVR. EXAM: PORTABLE CHEST 1 VIEW COMPARISON:  Chest CT 11/21/2019 and earlier. FINDINGS: Portable AP upright view at 1209 hours. Stable cardiomegaly and mediastinal contours. Normal lung volumes. Visualized tracheal air column is within normal limits. Chronic  retrocardiac hypo ventilation appears stable. Elsewhere Allowing for portable technique the lungs are clear. No pneumothorax. No acute osseous abnormality identified. Paucity of bowel gas in the upper abdomen. IMPRESSION: Stable cardiomegaly. No acute cardiopulmonary abnormality. Electronically Signed  By: Genevie Ann M.D.   On: 12/22/2019 12:33    Assessment and plan- Patient is a 69 y.o. female with history of nonmuscle invasive bladder cancer, endometrial mass and liver metastases probably representing metastatic endometrial cancer. Oncology consulted for further management  1. Nonmetastatic/nonmuscle invasive bladder cancer: Reported history of hematuria. Foley draining blood tinged dark urine but not grossly bloody.  H&H is grossly stable. Her metastatic disease is not from her bladder cancer. She has undergone TURBT with Dr. Diamantina Providence in the past. If there is a concern for persistent hematuria her Eliquis may need to be held and urology to be consulted. No oncology input as an inpatient for this issue  2. Liver metastases from possible endometrial cancer: Patient understands that her performance status is borderline for chemotherapy which is palliative not curative. Weekly carbo/taxol could be attempted based on how she tolerates it. Best supportive care and hospice is also a reasonable alternative. Patient is leaning towards trying chemotherapy but is not sure yet. I will have palliative care to see her as well. Follow up with me as an outpatient.       Visit Diagnosis 1. Urinary tract infection associated with indwelling urethral catheter, initial encounter ([place])   2. Sepsis, due to unspecified organism, unspecified whether acute organ dysfunction present ([place])   3. Atrial fibrillation with RVR ([place])   4. Morbid obesity with BMI of 45.0-49.9, adult ([place])   5. Type 2 diabetes mellitus without complication, without long-term current use of insulin ([employer])   6. Chronic atrial fibrillation (HCC)      Dr. Randa Evens, MD, MPH [facility] [phone] 12/26/2019

## 2019-12-26 NOTE — TOC Progression Note (Signed)
Transition of Care [facility]) - Progression Note    Patient Details  Name: Brittney Shepard MRN: [id number] Date of Birth: [birth year]  Transition of Care [facility]) CM/SW Contact  Eileen Stanford, LCSW Phone Number: 12/26/2019, 12:42 PM  Clinical Narrative:   Pt is in copay days with insurance if she goes to SNF. Pt states she can not pay. Pt lives alone. CSW will arrange PT, OT, RN, Aid, and SW for [facility]. Pt and MD aware.        Expected Discharge Plan and Services                                                 Social Determinants of Health (SDOH) Interventions    Readmission Risk Interventions No flowsheet data found.

## 2019-12-26 NOTE — Progress Notes (Signed)
PROGRESS NOTE    Brittney Shepard  DEY:[id number] DOB: 07/11/1950 DOA: 12/22/2019 PCP: Marguerita Merles, MD   Brief Narrative: Brittney Shepard is a 69 y.o. 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 fib not on anticoagulation, history of diabetes mellitus, hypertension and an indwelling Foley catheter which was placed during her last hospitalization.  Patient was recently discharged from skilled nursing facility where she went for subacute rehab Patient presented to the ER via EMS for evaluation of rapid atrial fibrillation.  Per EMS patient was noted to have dirty urine and her Foley catheter had been in place for about a month.  Patient complains about having bladder spasms.  Per EMS she was noted to have heart rates between 160s to 200 and received 2.5 mg of IV metoprolol.  She was said to have become hypotensive after receiving the metoprolol and EMS administered 600 cc of fluid bolus.  Upon arrival to the ER her blood pressure had improved and was 115/83. Patient denies having any fever or chills, no chest pain, no abdominal pain or any changes in her bowel habits.  She complains of exertional shortness of breath as well as having the urge to void despite having a Foley catheter in place. In the ER she was still in A. fib with a rapid ventricular rate and her heart rate was between 130 and 150bpm. Labs reveal sodium 140, potassium 3.3, bicarb 29, glucose 169, BUN 19, creatinine 1.21, calcium 7.4, lactic acid 2.2 >> 1.7, white count 8.8, hemoglobin 11.8, hematocrit 37.5, platelet count 310  Chest x-ray reviewed by me shows no acute infiltrate or effusion. Twelve-lead EKG reviewed by me shows rapid A. fib.  Assessment & Plan:   Principal Problem:   Atrial fibrillation with rapid ventricular response (HCC) Active Problems:   Urothelial  carcinoma of bladder (HCC)   Essential hypertension   Type 2 diabetes mellitus without complication, without long-term current use of insulin (HCC)   Morbid obesity with BMI of 45.0-49.9, adult (HCC)   Hypokalemia Pt admitted with a.fib rvr found to have elevated lactic acid and source of uti, pt DOES NOT meet sepsis criteria on admission nor throughout this stay. Pt did present initially with elevated lactic and suspected  Source, pt was afebrile and no wbc count no organ dysfunction.   Atrial Fibrillation with a RVR: Currently in sinus rhythm. Patient with a known history of A Fib who presents with a RVR Continue Cardizem infusion for rate control Continue oral Cardizem and Atenolol Patient has a CHADS2VASC score of 5 and ideally requires long term anticoagulation as primary prophylaxis for an acute stroke . Patient declined long term anticoagulation and is only on Aspirin 81mg  Obtain TSH levels. Cardiology consulted-Greatly appreciate management and care.  Pt refuses Anticoagulation initially, however patient started on Eliquis yesterday.  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  Discussed her case with her Hollice Espy and Dr. Wilford Sports.  Patient has developed gross hematuria no clots in has good urinary output in her Foley bag attribute this new gross hematuria to her recent Eliquis that has been started for her A. fib RVR.  I will decrease the dose of the Eliquis to 2.5 mg every 12 if hematuria persists worsens will discontinue it.  If hematuria resolves or improves we will then tailor the dose and increase back to 5 mg every 12 for complete anticoagulation effect for stroke prevention.  UTI: Resolved. Patient has a chronic  indwelling Foley catheter and has pyuria. Her lactic acid level was elevated but shows a downward trend Will place patient on Rocephin 1g IV daily while awaiting culture results,which is chronic and  Rocephin d/c.  Patient treated with 1 dose of fosfomycin. . Morbid obesity : Complicates overall prognosis and care. Counseling as outpatient, or inpatient once patient is stable.   Type 2 diabetes mellitus:  Continue consistent carbohydrate  Place patient on Glycemic control with insulin.   Chronic Diastolic CHF: Continue scheduled Furosemide and Atenolol. Today appreciate cardiology consult management and care.  Hypokalemia: Secondary to diuretic therapy Supplement potassium, check levels with a.m. labs.  Hypomagnesemia: replace 24m g iv x 1.  Check levels with a.m. labs.  OSA: Autopap per resp, pt was not tolerated. Outpatient eval per pcp, nasal cpap consider oral appliance . She did not tolerate AutoPap and needs sleep study and possible nasal CPAP mask, if appropriate this needs to be addressed by primary care provider as outpatient.  DVT prophylaxis:  Lovenox Code Status: Full code Family Communication: Greater than 50% of time was spent discussing plan of care with patient at the bedside. He verbalizes understanding and agrees with the plan Disposition Plan: Back to previous home environment Consults called: Cardiology-Dr.Fath.  Subjective: Pt is alert awake and comfortable denies any complaints.  Rt shin wound after hurting it in the walker.   Pt is alert and d/w her about PT consult and Home health. Suspect pt may need STR,.  Patient is alert awake and oriented states that she wants to go to a skilled nursing facility but does not not have enough money to pay for her services.  Discussed with patient and case manager Shelton Silvas, at bedside about discharge plan once patient's clinical condition is stable and we will have physical therapy and Occupational Therapy  while patient is in-house.  Patient agrees with plan.  Form patient about the hematuria she denies any trauma overnight or any pain.  Objective: Vitals:   12/26/19 0828 12/26/19 0933 12/26/19 1000 12/26/19 1217  BP:   114/72 103/71  Pulse:  (!) 105 72 98  Resp: 17 20  18   Temp:    98 F (36.7 C)  TempSrc:      SpO2:  95%  99%  Weight:      Height:        Intake/Output Summary (Last 24 hours) at 12/26/2019 1625 Last data filed at 12/26/2019 1601 Gross per 24 hour  Intake 240 ml  Output 1100 ml  Net -860 ml   Filed Weights   12/23/19 0421 12/25/19 0433 12/26/19 0338  Weight: 126.3 kg 127.6 kg 130.2 kg   Blood pressure 103/71, pulse 98, temperature 98 F (36.7 C), resp. rate 18, height 5\' 5"  (1.651 m), weight 130.2 kg, SpO2 99 %. Examination: General exam: Appears calm and comfortable  Respiratory system: Clear to auscultation. Respiratory effort normal. Cardiovascular system: S1 & S2 heard, RRR. No JVD, murmurs, rubs, gallops or clicks. No pedal edema. Gastrointestinal system: Abdomen is nondistended, soft and nontender. No organomegaly or masses felt. Normal bowel sounds heard. Central nervous system: Alert and oriented. No focal neurological deficits. Extremities: Symmetric 5 x 5  power. Skin: No rashes, lesions or ulcers Psychiatry: Judgement and insight appear normal. Mood & affect appropriate.   Data Reviewed: I have personally reviewed following labs and imaging studies  CBC: Recent Labs  Lab 12/22/19 1104 12/23/19 1157 12/24/19 0738 12/25/19 0459 12/26/19 0459  WBC 8.8 6.9 7.6 7.6 7.1  NEUTROABS 6.2  --  5.1 5.3 4.7  HGB 11.8* 11.2* 11.7* 10.9* 10.3*  HCT 37.5 36.1 37.5 33.5* 32.4*  MCV 86.2 88.9 87.6 85.0 85.5  PLT 310 265 175 245 696   Basic Metabolic Panel: Recent Labs  Lab 12/22/19 1104 12/22/19 1640 12/23/19 1157 12/24/19 0453 12/25/19 0459 12/26/19 0459  NA 140  --  135 138 136 137  K 3.3*  --  3.7 3.8 3.3* 4.0  CL 99  --  97* 98 100 100   CO2 29  --  24 28 27 28   GLUCOSE 163*  --  130* 125* 128* 159*  BUN 19  --  16 14 13 15   CREATININE 1.21*  --  1.04* 0.93 0.83 0.87  CALCIUM 7.4*  --  7.1* 7.4* 7.3* 7.4*  MG  --  1.0* 1.5* 1.6* 1.9 1.9  PHOS  --   --   --  2.8 2.9 2.8   GFR: Estimated Creatinine Clearance: 80.8 mL/min (by C-G formula based on SCr of 0.87 mg/dL). Liver Function Tests: Recent Labs  Lab 12/22/19 1104 12/24/19 0453 12/25/19 0459 12/26/19 0459  AST 23 22 18 21   ALT 10 9 8 11   ALKPHOS 73 64 63 66  BILITOT 1.2 1.0 0.9 0.7  PROT 6.6 5.9* 5.8* 5.7*  ALBUMIN 3.1* 2.5* 2.6* 2.6*   No results for input(s): LIPASE, AMYLASE in the last 168 hours. No results for input(s): AMMONIA in the last 168 hours. Coagulation Profile: Recent Labs  Lab 12/22/19 1104  INR 1.2   Cardiac Enzymes: No results for input(s): CKTOTAL, CKMB, CKMBINDEX, TROPONINI in the last 168 hours. BNP (last 3 results) No results for input(s): PROBNP in the last 8760 hours. HbA1C: No results for input(s): HGBA1C in the last 72 hours. CBG: Recent Labs  Lab 12/25/19 1559 12/25/19 2138 12/26/19 0805 12/26/19 1215 12/26/19 1600  GLUCAP 136* 163* 140* 155* 145*   Lipid Profile: No results for input(s): CHOL, HDL, LDLCALC, TRIG, CHOLHDL, LDLDIRECT in the last 72 hours. Thyroid Function Tests: No results for input(s): TSH, T4TOTAL, FREET4, T3FREE, THYROIDAB in the last 72 hours. Anemia Panel: No results for input(s): VITAMINB12, FOLATE, FERRITIN, TIBC, IRON, RETICCTPCT in the last 72 hours. Sepsis Labs: Recent Labs  Lab 12/22/19 1104 12/22/19 1311  LATICACIDVEN 2.2* 1.7    Recent Results (from the past 240 hour(s))  Blood Culture (routine x 2)     Status: None (Preliminary result)   Collection Time: 12/22/19 11:04 AM   Specimen: BLOOD  Result Value Ref Range Status   Specimen Description BLOOD RIGHT FOREARM  Final   Special Requests   Final    BOTTLES DRAWN AEROBIC AND ANAEROBIC Blood Culture adequate volume   Culture    Final    NO GROWTH 4 DAYS Performed at [facility], [address] [place], [place] [address]    Report Status PENDING  Incomplete  Blood Culture (routine x 2)     Status: None (Preliminary result)   Collection Time: 12/22/19 11:04 AM   Specimen: BLOOD  Result Value Ref Range Status   Specimen Description BLOOD RIGHT FOREARM  Final   Special Requests   Final  BOTTLES DRAWN AEROBIC AND ANAEROBIC Blood Culture results may not be optimal due to an inadequate volume of blood received in culture bottles   Culture   Final    NO GROWTH 4 DAYS Performed at [facility], [address] [place], [place] [address]    Report Status PENDING  Incomplete  SARS Coronavirus 2 by RT PCR (hospital order, performed in [facility] hospital lab) Nasopharyngeal Nasopharyngeal Swab     Status: None   Collection Time: 12/22/19 11:12 AM   Specimen: Nasopharyngeal Swab  Result Value Ref Range Status   SARS Coronavirus 2 NEGATIVE NEGATIVE Final    Comment: (NOTE) SARS-CoV-2 target nucleic acids are NOT DETECTED.  The SARS-CoV-2 RNA is generally detectable in upper and lower respiratory specimens during the acute phase of infection. The lowest concentration of SARS-CoV-2 viral copies this assay can detect is 250 copies / mL. A negative result does not preclude SARS-CoV-2 infection and should not be used as the sole basis for treatment or other patient management decisions.  A negative result may occur with improper specimen collection / handling, submission of specimen other than nasopharyngeal swab, presence of viral mutation(s) within the areas targeted by this assay, and inadequate number of viral copies (<250 copies / mL). A negative result must be combined with clinical observations, patient history, and epidemiological information.  Fact Sheet for Patients:   StrictlyIdeas.no  Fact Sheet for Healthcare  Providers: BankingDealers.co.za  This test is not yet approved or  cleared by the [place] FDA and has been authorized for detection and/or diagnosis of SARS-CoV-2 by FDA under an Emergency Use Authorization (EUA).  This EUA will remain in effect (meaning this test can be used) for the duration of the COVID-19 declaration under Section 564(b)(1) of the Act, 21 U.S.C. section 360bbb-3(b)(1), unless the authorization is terminated or revoked sooner.  Performed at [facility], [address] [place], [place] [address]   Urine culture     Status: Abnormal   Collection Time: 12/22/19 11:51 AM   Specimen: In/Out Cath Urine  Result Value Ref Range Status   Specimen Description   Final    IN/OUT CATH URINE Performed at [facility], [address], [place], [place] [address]    Special Requests   Final    NONE Performed at [facility], [place], [place] [address]    Culture 20,000 COLONIES/mL ENTEROCOCCUS FAECIUM (A)  Final   Report Status 12/25/2019 FINAL  Final   Organism ID, Bacteria ENTEROCOCCUS FAECIUM (A)  Final      Susceptibility   Enterococcus faecium - MIC*    AMPICILLIN <=2 SENSITIVE Sensitive     NITROFURANTOIN 64 INTERMEDIATE Intermediate     VANCOMYCIN <=0.5 SENSITIVE Sensitive     * 20,000 COLONIES/mL ENTEROCOCCUS FAECIUM     Radiology Studies: No results found.   Scheduled Meds: . apixaban  2.5 mg Oral BID  . atenolol  12.5 mg Oral Daily  . Chlorhexidine Gluconate Cloth  6 each Topical Daily  . diltiazem  60 mg Oral Q12H  . furosemide  40 mg Oral BID  . insulin aspart  0-20 Units Subcutaneous TID WC  . ipratropium-albuterol  3 mL Nebulization BID  . multivitamin with minerals  1 tablet Oral Daily  . pneumococcal 23 valent vaccine  0.5 mL Intramuscular Tomorrow-1000  . potassium chloride  20 mEq Oral BID  . senna-docusate  1 tablet Oral BID  . sodium chloride flush  3 mL  Intravenous  Q12H   Continuous Infusions: . sodium chloride       LOS: 4 days    Para Skeans, MD [facility] Pager [phone] If 7PM-7AM, please contact night-coverage [email or web address] Password [facility] 12/26/2019, 4:25 PM

## 2019-12-26 NOTE — Progress Notes (Signed)
[facility] hospital liaison note:  Patient has a pending [employer] Palliative referral. TOC Bridget Cobb made aware.  Flo Shanks BSN, RN, [place] [phone]

## 2019-12-27 ENCOUNTER — Ambulatory Visit: Payer: Medicare Other

## 2019-12-27 DIAGNOSIS — Z515 Encounter for palliative care: Secondary | ICD-10-CM

## 2019-12-27 DIAGNOSIS — C801 Malignant (primary) neoplasm, unspecified: Secondary | ICD-10-CM

## 2019-12-27 DIAGNOSIS — C787 Secondary malignant neoplasm of liver and intrahepatic bile duct: Secondary | ICD-10-CM

## 2019-12-27 LAB — CBC
HCT: 35.5 % — ABNORMAL LOW (ref 36.0–46.0)
Hemoglobin: 11.1 g/dL — ABNORMAL LOW (ref 12.0–15.0)
MCH: 27.5 pg (ref 26.0–34.0)
MCHC: 31.3 g/dL (ref 30.0–36.0)
MCV: 88.1 fL (ref 80.0–100.0)
Platelets: 239 10*3/uL (ref 150–400)
RBC: 4.03 MIL/uL (ref 3.87–5.11)
RDW: 15.8 % — ABNORMAL HIGH (ref 11.5–15.5)
WBC: 6.6 10*3/uL (ref 4.0–10.5)
nRBC: 0 % (ref 0.0–0.2)

## 2019-12-27 LAB — BASIC METABOLIC PANEL
Anion gap: 9 (ref 5–15)
BUN: 14 mg/dL (ref 8–23)
CO2: 29 mmol/L (ref 22–32)
Calcium: 8.1 mg/dL — ABNORMAL LOW (ref 8.9–10.3)
Chloride: 98 mmol/L (ref 98–111)
Creatinine, Ser: 0.93 mg/dL (ref 0.44–1.00)
GFR calc Af Amer: >60 mL/min (ref >60)
GFR calc non Af Amer: >60 mL/min (ref >60)
Glucose, Bld: 218 mg/dL — ABNORMAL HIGH (ref 70–99)
Potassium: 4.2 mmol/L (ref 3.5–5.1)
Sodium: 136 mmol/L (ref 135–145)

## 2019-12-27 LAB — CULTURE, BLOOD (ROUTINE X 2)
Culture: NO GROWTH
Culture: NO GROWTH
Special Requests: ADEQUATE

## 2019-12-27 LAB — GLUCOSE, CAPILLARY
Glucose-Capillary: 130 mg/dL — ABNORMAL HIGH (ref 70–99)
Glucose-Capillary: 147 mg/dL — ABNORMAL HIGH (ref 70–99)
Glucose-Capillary: 178 mg/dL — ABNORMAL HIGH (ref 70–99)

## 2019-12-27 MED ORDER — APIXABAN 2.5 MG PO TABS: 2.5000 mg | ORAL_TABLET | Freq: Two times a day (BID) | ORAL | 1 refills | Status: AC

## 2019-12-27 MED ORDER — ATENOLOL 25 MG PO TABS
25.0000 mg | ORAL_TABLET | Freq: Every day | ORAL | 1 refills | Status: DC
Start: 2019-12-28 — End: 2020-02-22

## 2019-12-27 MED ORDER — ATENOLOL 25 MG PO TABS: 25.0000 mg | ORAL_TABLET | Freq: Every day | ORAL | Status: DC

## 2019-12-27 MED ORDER — SENNOSIDES-DOCUSATE SODIUM 8.6-50 MG PO TABS: 1.0000 | ORAL_TABLET | Freq: Two times a day (BID) | ORAL | 0 refills | Status: AC

## 2019-12-27 MED ORDER — ATENOLOL 25 MG PO TABS
12.5000 mg | ORAL_TABLET | Freq: Every day | ORAL | 1 refills | Status: DC
Start: 2019-12-28 — End: 2019-12-27

## 2019-12-27 MED ORDER — IPRATROPIUM-ALBUTEROL 0.5-2.5 (3) MG/3ML IN SOLN: 3.0000 mL | Freq: Three times a day (TID) | RESPIRATORY_TRACT | 0 refills | Status: AC

## 2019-12-27 MED ORDER — POLYETHYLENE GLYCOL 3350 17 G PO PACK: 17.0000 g | PACK | Freq: Every day | ORAL | 0 refills | Status: AC | PRN

## 2019-12-27 NOTE — Progress Notes (Addendum)
Discharge instructions given to patient. IV's taken out and tele monitor taken off. Patient verbalized understanding without any questions or complaints. Patient being discharged via EMS. Patient going home with foley.EMS called by social work, son updated.   Update: Patient discharged via EMS.

## 2019-12-27 NOTE — Discharge Summary (Signed)
Physician Discharge Summary  Brittney Shepard GHW:[id number] DOB: 1951-02-04 DOA: 12/22/2019  PCP: Marguerita Merles, MD  Admit date: 12/22/2019 Discharge date: 12/27/2019  Admitted From: Home Disposition:  Home  Recommendations for Outpatient Follow-up:  1. Follow up with PCP in 1-2 weeks.  CBC / Hemoglobin should be checked early next week. 2. Please obtain BMP/CBC within one week 3. Please follow up with oncology, Dr. Janese Banks 4. Please follow up with urology 5. Please follow up with cardiology  Home Health: PT / OT / RN / Aide / SW  Equipment/Devices: none   Discharge Condition: Stable  CODE STATUS: Full  Diet recommendation: Carb Modified   Discharge Diagnoses: Principal Problem:   Atrial fibrillation with rapid ventricular response ([place]) Active Problems:   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[place])   Essential hypertension   Type 2 diabetes mellitus without complication, without long-term current use of insulin (HCC)   Morbid obesity with BMI of 45.0-49.9, adult ([employer])   Hypokalemia   Palliative care encounter    Summary of HPI and Hospital Course:   Brittney Shepard a 69 y.o.femalewith medical history significant forrecent diagnosis of urothelial cancer invasive into the lamina proprianot yet on chemotherapy, status post recent left ureteral stent placement, history of A. fib not on anticoagulation, diabetes mellitus, hypertension and an indwelling Foley catheter which was placed about one month ago, during prior hospitalization. Patient was recently discharged from SNF where she went for subacute rehab.   She presented to the ER via EMS on 12/22/19 for evaluation of exertional shortness of breath and urge to void despite having a Foley catheter in place, bladder spasms.  EMS reported rapid heart rates 160s to 200.  Episode of hypotension after metoprolol given for rate control, improved with small fluid bolus, normotensive on arrival to ED.     In the ER, EKG showed A. fib  with RVR, with rates 130s to 150 bpm on the monitor.  Notable labs included potassium 3.3, lactic acid 2.2 >> 1.7, no leukocytosis, Cr 1.21, BUN 18.  Chest xray negative.    Summary from Dr. Posey Pronto note of 8/24: "Pt admitted with a.fib rvr found to have elevated lactic acid and source of uti, pt DOES NOT meet sepsis criteria on admission nor throughout this stay. Pt did present initially with elevated lactic and suspected  Source, pt was afebrile and no wbc count no organ dysfunction. "   Atrial Fibrillation with a RVR: Currently in sinus rhythm. Known history of A Fib, presented with RVR.   Cardiology was consulted. Rate was controlled with Cardizem infusion, transitioned to PO Cardizem in addition to atenolol for ongoing rate control.   CHADS2VASC score of 5, ideally requires long term anticoagulation as primary prophylaxis for an acute stroke.  Patient initially declined anticoagulation, but ultimately agreed and was started on Eliquis with dose reduction to 2.5 mg BID due to hematuria with her bladder cancer.     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 Patient has metastatic disease and is scheduled to follow up with oncology to disscuss chemotherapy treatment- per oncology.  Prior attending, Dr. Posey Pronto discussed case with her Hollice Espy and Dr. Wilford Sports.   Patient developed gross hematuria without clots, good output in Foley bag, attributed to recent Eliquis being started.  Dose of the Eliquis was reduced to 2.5 mg BID.  Patient had stable hemoglobin with mild hematuria.  Will follow up outpatient.   UTI:  Resolved. Patient has a chronic indwelling Foley catheter and has pyuria. Her lactic acid level was elevated but shows a downward trend Treated empirically with Rocephin while awaiting culture which grew Enterococcus faecium. Rocephin d/c and treated with 1 dose of fosfomycin.  Symptoms  improved. . Morbid obesity : Body mass index is 47.76 kg/m. Complicates overall prognosis and care.  Counseled during admission.   Type 2 diabetes mellitus:  Continue consistent carbohydrate diet.  On insulin.   Chronic Diastolic CHF: on furosemide and atenolol. Cardiology consulted.  Hypokalemia: Secondary to diuretic therapy Supplement potassium, check levels in follow up  Hypomagnesemia: replace 42m g iv x 1.  Check levels in follow up.  OSA: Autopap attempted per resp, not tolerated. Outpatient eval per pcp, nasal cpap consider oral appliance . She did not tolerate AutoPap and needs sleep study and possible nasal CPAP mask,  Follow up per PCP as outpatient.   Discharge Instructions   Discharge Instructions    (HEART FAILURE PATIENTS) Call MD:  Anytime you have any of the following symptoms: 1) 3 pound weight gain in 24 hours or 5 pounds in 1 week 2) shortness of breath, with or without a dry hacking cough 3) swelling in the hands, feet or stomach 4) if you have to sleep on extra pillows at night in order to breathe.   Complete by: As directed    Amb referral to [facility]   Complete by: As directed    Call MD for:  extreme fatigue   Complete by: As directed    Call MD for:  persistant dizziness or light-headedness   Complete by: As directed    Call MD for:  persistant nausea and vomiting   Complete by: As directed    Call MD for:  severe uncontrolled pain   Complete by: As directed    Call MD for:  temperature >100.4   Complete by: As directed    Diet - low sodium heart healthy   Complete by: As directed    Diet Carb Modified   Complete by: As directed    Discharge instructions   Complete by: As directed    You will follow up with urology and oncology as an outpatient.    The blood in your urine is from the cancer and being on Eliquis.  We have decreased the Eliquis dose.  You are not losing very much blood (your hemoglobin is stable), so it is safe  to leave the hospital and see urology in clinic.  I discussed with urology and they agreed.  If you continue to have blood in your urine 2 days from now, please STOP Eliquis, and call your cardiologist to inform them.    Your primary care doctor should recheck labs early next week.  You were treated with antibiotics for UTI, finished those while here in the hospital.  Because our physical therapists recommended you go to rehab, but are unable due to cost, we have arranged home health services.  Please work diligently with them to improve your strength and safety with mobility.  Be very cautious at home to prevent falling.  Please get help from family or friends as much as you need it.   Discharge wound care:   Complete by: As directed    Right lower leg wound - wash twice daily with soap and warm water, pat dry and cover with xeroform gauze and dry gauze over top.   Increase activity slowly   Complete by: As directed  Allergies as of 12/27/2019      Reactions   Penicillin G Hives   Sulfa Antibiotics Rash      Medication List    STOP taking these medications   aspirin EC 81 MG tablet     TAKE these medications   acetaminophen 325 MG tablet Commonly known as: TYLENOL Take 2 tablets (650 mg total) by mouth every 6 (six) hours as needed for mild pain (or Fever >/= 101).   apixaban 2.5 MG Tabs tablet Commonly known as: ELIQUIS Take 1 tablet (2.5 mg total) by mouth 2 (two) times daily.   atenolol 25 MG tablet Commonly known as: TENORMIN Take 1 tablet (25 mg total) by mouth daily. Start taking on: December 28, 2019 What changed:   medication strength  how much to take   diltiazem 60 MG 12 hr capsule Commonly known as: CARDIZEM SR Take 1 capsule (60 mg total) by mouth every 12 (twelve) hours. What changed: when to take this   furosemide 40 MG tablet Commonly known as: Lasix Take 1 tablet (40 mg total) by mouth 2 (two) times daily.   ipratropium-albuterol 0.5-2.5 (3)  MG/3ML Soln Commonly known as: DUONEB Take 3 mLs by nebulization 3 (three) times daily.   metFORMIN 500 MG tablet Commonly known as: GLUCOPHAGE Take 500 mg by mouth daily with breakfast.   multivitamin tablet Take 1 tablet by mouth daily.   polyethylene glycol 17 g packet Commonly known as: MIRALAX / GLYCOLAX Take 17 g by mouth daily as needed for mild constipation.   potassium chloride 10 MEQ tablet Commonly known as: KLOR-CON Take 10 mEq by mouth 2 (two) times daily.   senna-docusate 8.6-50 MG tablet Commonly known as: Senokot-S Take 1 tablet by mouth 2 (two) times daily.            Discharge Care Instructions  (From admission, onward)         Start     Ordered   12/27/19 0000  Discharge wound care:       Comments: Right lower leg wound - wash twice daily with soap and warm water, pat dry and cover with xeroform gauze and dry gauze over top.   12/27/19 1423          Follow-up Information    Sindy Guadeloupe, MD On 12/29/2019.   Specialty: Oncology Why: Appointment at 1:15pm Contact information: [place] [address] [phone]        Hollice Espy, MD.   Specialty: Urology Why: Patient will need to call doctor for follow up appointment. Contact information: [place] [address] [phone]              Allergies  Allergen Reactions  . Penicillin G Hives  . Sulfa Antibiotics Rash    Consultations:  Cardiology   Palliative   Oncology   Procedures/Studies: NM PET Image Initial (PI) Skull Base To Thigh  Result Date: 12/12/2019 CLINICAL DATA:  Initial treatment strategy for metastatic disease of unknown primary. Prior trans urethral resection of bladder tumor 11/19/2019. Biopsy of liver 11/22/2019. EXAM: NUCLEAR MEDICINE PET SKULL BASE TO THIGH TECHNIQUE: 14.4 mCi F-18 FDG was injected intravenously. Full-ring PET imaging was performed from the skull base to thigh after the  radiotracer. CT data was obtained and used for attenuation correction and anatomic localization. Fasting blood glucose: 137 mg/dl COMPARISON:  Chest CT 11/22/2019. Abdominopelvic MRI 11/21/2019. Abdominopelvic CT of 11/20/2019. FINDINGS: Mediastinal blood pool activity: SUV max  3.3 Liver activity: SUV max NA NECK: No areas of abnormal hypermetabolism. Incidental CT findings: Cerebral atrophy.  No cervical adenopathy. CHEST: No pulmonary parenchymal or thoracic nodal hypermetabolism. No hypermetabolism to correspond to the previously described macrolobulated anterior mediastinal mass. 4.3 x 3.1 cm and 14.5 HU on 80/3. Incidental CT findings: Deferred to recent diagnostic CT. Cardiomegaly. Aortic and coronary artery atherosclerosis. ABDOMEN/PELVIS: Segment 4 B hypermetabolic liver mass measures 4.9 x 3.9 cm and a S.U.V. max of 22.0 on 145/3. Porta hepatis nodal metastasis at 1.2 cm and a S.U.V. max of 10.2 on 135/3. More equivocal abdominal retroperitoneal nodes, including at 1.1 cm and a S.U.V. max of 3.6 in the pre caval space on 151/3. No pelvic nodal hypermetabolism. Hypermetabolism corresponding to endometrial soft tissue fullness and heterogeneity. Example at on the order of 4.5 cm and a S.U.V. max of 14.1 on 220/3. The bladder is not well evaluated secondary to physiologic urinary hypermetabolism. There is also urinary contamination about the perineum. Incidental CT findings: Deferred to recent CT and MRI. Abdominal aortic atherosclerosis. Foley catheter within the urinary bladder. Left ureteric stent without significant hydronephrosis. SKELETON: No abnormal marrow activity. Incidental CT findings: none IMPRESSION: 1. Hypermetabolic endometrial mass, consistent with carcinoma. 2. Metastatic disease to the liver and upper abdominal node or nodes as detailed above. Given the distribution of metastasis, and the extent of endometrial hypermetabolism, endometrial primary is favored. 3. No thoracic hypermetabolic  metastasis. The macrolobulated anterior mediastinal mass is not significantly hypermetabolic. Favor pericardial or bronchogenic cyst. Thymoma or less likely thymic carcinoma cannot be entirely excluded but are felt less likely. Given comorbidities, of questionable clinical significance. Consider chest CT follow-up at 3 months. Electronically Signed   By: Abigail Miyamoto M.D.   On: 12/12/2019 14:46   DG Chest Port 1 View  Result Date: 12/22/2019 CLINICAL DATA:  69 year old female with possible sepsis. Atrial fibrillation with RVR. EXAM: PORTABLE CHEST 1 VIEW COMPARISON:  Chest CT 11/21/2019 and earlier. FINDINGS: Portable AP upright view at 1209 hours. Stable cardiomegaly and mediastinal contours. Normal lung volumes. Visualized tracheal air column is within normal limits. Chronic retrocardiac hypo ventilation appears stable. Elsewhere Allowing for portable technique the lungs are clear. No pneumothorax. No acute osseous abnormality identified. Paucity of bowel gas in the upper abdomen. IMPRESSION: Stable cardiomegaly. No acute cardiopulmonary abnormality. Electronically Signed   By: Genevie Ann M.D.   On: 12/22/2019 12:33       Subjective: Patient seen this AM.  Reports feeling better.  Has some ongoing bladder discomfort but improved.  No fevers/chills, palpations, SOB or other acute complaints.   Discharge Exam: Vitals:   12/27/19 1230 12/27/19 1536  BP: 102/66 (!) 101/59  Pulse: 94 88  Resp:  18  Temp: 97.7 F (36.5 C) 97.6 F (36.4 C)  SpO2: 100% 100%   Vitals:   12/27/19 0801 12/27/19 0814 12/27/19 1230 12/27/19 1536  BP:  104/84 102/66 (!) 101/59  Pulse:  (!) 106 94 88  Resp:  19  18  Temp:  98.5 F (36.9 C) 97.7 F (36.5 C) 97.6 F (36.4 C)  TempSrc:  Oral Oral Oral  SpO2: 97% 96% 100% 100%  Weight:      Height:        General: Pt is alert, awake, not in acute distress, morbidly obese Cardiovascular: RRR, S1/S2 +, no rubs, no gallops Respiratory: CTA bilaterally, no wheezing,  no rhonchi Abdominal: Soft, NT, ND, bowel sounds + Genitourinary: Foley present, light hematuria    The  results of significant diagnostics from this hospitalization (including imaging, microbiology, ancillary and laboratory) are listed below for reference.     Microbiology: Recent Results (from the past 240 hour(s))  Blood Culture (routine x 2)     Status: None   Collection Time: 12/22/19 11:04 AM   Specimen: BLOOD  Result Value Ref Range Status   Specimen Description BLOOD RIGHT FOREARM  Final   Special Requests   Final    BOTTLES DRAWN AEROBIC AND ANAEROBIC Blood Culture adequate volume   Culture   Final    NO GROWTH 5 DAYS Performed at [facility], [address] [place], [place] [address]    Report Status 12/27/2019 FINAL  Final  Blood Culture (routine x 2)     Status: None   Collection Time: 12/22/19 11:04 AM   Specimen: BLOOD  Result Value Ref Range Status   Specimen Description BLOOD RIGHT FOREARM  Final   Special Requests   Final    BOTTLES DRAWN AEROBIC AND ANAEROBIC Blood Culture results may not be optimal due to an inadequate volume of blood received in culture bottles   Culture   Final    NO GROWTH 5 DAYS Performed at [facility], [address] [place], [place] [address]    Report Status 12/27/2019 FINAL  Final  SARS Coronavirus 2 by RT PCR (hospital order, performed in [place] hospital lab) Nasopharyngeal Nasopharyngeal Swab     Status: None   Collection Time: 12/22/19 11:12 AM   Specimen: Nasopharyngeal Swab  Result Value Ref Range Status   SARS Coronavirus 2 NEGATIVE NEGATIVE Final    Comment: (NOTE) SARS-CoV-2 target nucleic acids are NOT DETECTED.  The SARS-CoV-2 RNA is generally detectable in upper and lower respiratory specimens during the acute phase of infection. The lowest concentration of SARS-CoV-2 viral copies this assay can detect is 250 copies / mL. A negative result does not preclude SARS-CoV-2 infection and  should not be used as the sole basis for treatment or other patient management decisions.  A negative result may occur with improper specimen collection / handling, submission of specimen other than nasopharyngeal swab, presence of viral mutation(s) within the areas targeted by this assay, and inadequate number of viral copies (<250 copies / mL). A negative result must be combined with clinical observations, patient history, and epidemiological information.  Fact Sheet for Patients:   StrictlyIdeas.no  Fact Sheet for Healthcare Providers: BankingDealers.co.za  This test is not yet approved or  cleared by the [place] FDA and has been authorized for detection and/or diagnosis of SARS-CoV-2 by FDA under an Emergency Use Authorization (EUA).  This EUA will remain in effect (meaning this test can be used) for the duration of the COVID-19 declaration under Section 564(b)(1) of the Act, 21 U.S.C. section 360bbb-3(b)(1), unless the authorization is terminated or revoked sooner.  Performed at [facility], [address] [place], [place] [address]   Urine culture     Status: Abnormal   Collection Time: 12/22/19 11:51 AM   Specimen: In/Out Cath Urine  Result Value Ref Range Status   Specimen Description   Final    IN/OUT CATH URINE Performed at [facility], [address], [place], [place] [address]    Special Requests   Final    NONE Performed at [facility], [place], [place] [address]    Culture 20,000 COLONIES/mL ENTEROCOCCUS FAECIUM (A)  Final   Report Status 12/25/2019 FINAL  Final  Organism ID, Bacteria ENTEROCOCCUS FAECIUM (A)  Final      Susceptibility   Enterococcus faecium - MIC*    AMPICILLIN <=2 SENSITIVE Sensitive     NITROFURANTOIN 64 INTERMEDIATE Intermediate     VANCOMYCIN <=0.5 SENSITIVE Sensitive     * 20,000 COLONIES/mL ENTEROCOCCUS FAECIUM     Labs: BNP  (last 3 results) Recent Labs    11/18/19 0919  BNP 768.1*   Basic Metabolic Panel: Recent Labs  Lab 12/22/19 1104 12/22/19 1640 12/23/19 1157 12/24/19 0453 12/25/19 0459 12/26/19 0459 12/27/19 0928  NA   < >  --  135 138 136 137 136  K   < >  --  3.7 3.8 3.3* 4.0 4.2  CL   < >  --  97* 98 100 100 98  CO2   < >  --  24 28 27 28 29   GLUCOSE   < >  --  130* 125* 128* 159* 218*  BUN   < >  --  16 14 13 15 14   CREATININE   < >  --  1.04* 0.93 0.83 0.87 0.93  CALCIUM   < >  --  7.1* 7.4* 7.3* 7.4* 8.1*  MG  --  1.0* 1.5* 1.6* 1.9 1.9  --   PHOS  --   --   --  2.8 2.9 2.8  --    < > = values in this interval not displayed.   Liver Function Tests: Recent Labs  Lab 12/22/19 1104 12/24/19 0453 12/25/19 0459 12/26/19 0459  AST 23 22 18 21   ALT 10 9 8 11   ALKPHOS 73 64 63 66  BILITOT 1.2 1.0 0.9 0.7  PROT 6.6 5.9* 5.8* 5.7*  ALBUMIN 3.1* 2.5* 2.6* 2.6*   No results for input(s): LIPASE, AMYLASE in the last 168 hours. No results for input(s): AMMONIA in the last 168 hours. CBC: Recent Labs  Lab 12/22/19 1104 12/22/19 1104 12/23/19 1157 12/24/19 0738 12/25/19 0459 12/26/19 0459 12/27/19 0928  WBC 8.8   < > 6.9 7.6 7.6 7.1 6.6  NEUTROABS 6.2  --   --  5.1 5.3 4.7  --   HGB 11.8*   < > 11.2* 11.7* 10.9* 10.3* 11.1*  HCT 37.5   < > 36.1 37.5 33.5* 32.4* 35.5*  MCV 86.2   < > 88.9 87.6 85.0 85.5 88.1  PLT 310   < > 265 175 245 240 239   < > = values in this interval not displayed.   Cardiac Enzymes: No results for input(s): CKTOTAL, CKMB, CKMBINDEX, TROPONINI in the last 168 hours. BNP: Invalid input(s): POCBNP CBG: Recent Labs  Lab 12/26/19 1215 12/26/19 1600 12/26/19 2115 12/27/19 0816 12/27/19 1228  GLUCAP 155* 145* 156* 130* 147*   D-Dimer No results for input(s): DDIMER in the last 72 hours. Hgb A1c No results for input(s): HGBA1C in the last 72 hours. Lipid Profile No results for input(s): CHOL, HDL, LDLCALC, TRIG, CHOLHDL, LDLDIRECT in the last 72  hours. Thyroid function studies No results for input(s): TSH, T4TOTAL, T3FREE, THYROIDAB in the last 72 hours.  Invalid input(s): FREET3 Anemia work up No results for input(s): VITAMINB12, FOLATE, FERRITIN, TIBC, IRON, RETICCTPCT in the last 72 hours. Urinalysis    Component Value Date/Time   COLORURINE AMBER (A) 12/22/2019 1151   APPEARANCEUR TURBID (A) 12/22/2019 1151   LABSPEC 1.018 12/22/2019 1151   PHURINE 6.0 12/22/2019 1151   GLUCOSEU NEGATIVE 12/22/2019 1151   HGBUR LARGE (A) 12/22/2019 1151  BILIRUBINUR NEGATIVE 12/22/2019 [place] 12/22/2019 1151   PROTEINUR >=300 (A) 12/22/2019 1151   NITRITE NEGATIVE 12/22/2019 1151   LEUKOCYTESUR MODERATE (A) 12/22/2019 1151   Sepsis Labs Invalid input(s): PROCALCITONIN,  WBC,  LACTICIDVEN Microbiology Recent Results (from the past 240 hour(s))  Blood Culture (routine x 2)     Status: None   Collection Time: 12/22/19 11:04 AM   Specimen: BLOOD  Result Value Ref Range Status   Specimen Description BLOOD RIGHT FOREARM  Final   Special Requests   Final    BOTTLES DRAWN AEROBIC AND ANAEROBIC Blood Culture adequate volume   Culture   Final    NO GROWTH 5 DAYS Performed at [facility], [address] [place], [place] [address]    Report Status 12/27/2019 FINAL  Final  Blood Culture (routine x 2)     Status: None   Collection Time: 12/22/19 11:04 AM   Specimen: BLOOD  Result Value Ref Range Status   Specimen Description BLOOD RIGHT FOREARM  Final   Special Requests   Final    BOTTLES DRAWN AEROBIC AND ANAEROBIC Blood Culture results may not be optimal due to an inadequate volume of blood received in culture bottles   Culture   Final    NO GROWTH 5 DAYS Performed at [facility], [address], [place], [place] [address]    Report Status 12/27/2019 FINAL  Final  SARS Coronavirus 2 by RT PCR (hospital order, performed in [place] hospital lab) Nasopharyngeal Nasopharyngeal Swab      Status: None   Collection Time: 12/22/19 11:12 AM   Specimen: Nasopharyngeal Swab  Result Value Ref Range Status   SARS Coronavirus 2 NEGATIVE NEGATIVE Final    Comment: (NOTE) SARS-CoV-2 target nucleic acids are NOT DETECTED.  The SARS-CoV-2 RNA is generally detectable in upper and lower respiratory specimens during the acute phase of infection. The lowest concentration of SARS-CoV-2 viral copies this assay can detect is 250 copies / mL. A negative result does not preclude SARS-CoV-2 infection and should not be used as the sole basis for treatment or other patient management decisions.  A negative result may occur with improper specimen collection / handling, submission of specimen other than nasopharyngeal swab, presence of viral mutation(s) within the areas targeted by this assay, and inadequate number of viral copies (<250 copies / mL). A negative result must be combined with clinical observations, patient history, and epidemiological information.  Fact Sheet for Patients:   StrictlyIdeas.no  Fact Sheet for Healthcare Providers: BankingDealers.co.za  This test is not yet approved or  cleared by the [place] FDA and has been authorized for detection and/or diagnosis of SARS-CoV-2 by FDA under an Emergency Use Authorization (EUA).  This EUA will remain in effect (meaning this test can be used) for the duration of the COVID-19 declaration under Section 564(b)(1) of the Act, 21 U.S.C. section 360bbb-3(b)(1), unless the authorization is terminated or revoked sooner.  Performed at [facility], [address] [place], [place] [address]   Urine culture     Status: Abnormal   Collection Time: 12/22/19 11:51 AM   Specimen: In/Out Cath Urine  Result Value Ref Range Status   Specimen Description   Final    IN/OUT CATH URINE Performed at [facility], [address] [place], [place] [address]    Special  Requests   Final    NONE Performed at [facility], [address] [place], [place] [address]  Culture 20,000 COLONIES/mL ENTEROCOCCUS FAECIUM (A)  Final   Report Status 12/25/2019 FINAL  Final   Organism ID, Bacteria ENTEROCOCCUS FAECIUM (A)  Final      Susceptibility   Enterococcus faecium - MIC*    AMPICILLIN <=2 SENSITIVE Sensitive     NITROFURANTOIN 64 INTERMEDIATE Intermediate     VANCOMYCIN <=0.5 SENSITIVE Sensitive     * 20,000 COLONIES/mL ENTEROCOCCUS FAECIUM     Time coordinating discharge: Over 30 minutes  SIGNED:   Ezekiel Slocumb, DO [facility] 12/27/2019, 3:42 PM   If 7PM-7AM, please contact night-coverage [email or web address]

## 2019-12-27 NOTE — Progress Notes (Addendum)
Progress Note  Patient Name: Destani Wamser Date of Encounter: 12/27/2019  [facility] Cardiologist: Kate Sable, MD   Subjective   Patient states doing okay.  Has some blood-tinged urine noted since starting Eliquis.  Dose was decreased from 5 mg twice daily to 2.5 mg twice daily.  Inpatient Medications    Scheduled Meds: . apixaban  2.5 mg Oral BID  . atenolol  12.5 mg Oral Daily  . Chlorhexidine Gluconate Cloth  6 each Topical Daily  . diltiazem  60 mg Oral Q12H  . furosemide  40 mg Oral BID  . insulin aspart  0-20 Units Subcutaneous TID WC  . ipratropium-albuterol  3 mL Nebulization BID  . multivitamin with minerals  1 tablet Oral Daily  . potassium chloride  20 mEq Oral BID  . senna-docusate  1 tablet Oral BID  . sodium chloride flush  3 mL Intravenous Q12H   Continuous Infusions: . sodium chloride     PRN Meds: sodium chloride, acetaminophen, ondansetron (ZOFRAN) IV, polyethylene glycol, sodium chloride flush   Vital Signs    Vitals:   12/27/19 0316 12/27/19 0801 12/27/19 0814 12/27/19 1230  BP: 111/72  104/84 102/66  Pulse: 93  (!) 106 94  Resp: 18  19   Temp: 97.9 F (36.6 C)  98.5 F (36.9 C) 97.7 F (36.5 C)  TempSrc: Oral  Oral Oral  SpO2: 94% 97% 96% 100%  Weight:      Height:        Intake/Output Summary (Last 24 hours) at 12/27/2019 1420 Last data filed at 12/27/2019 0622 Gross per 24 hour  Intake --  Output 1750 ml  Net -1750 ml   Last 3 Weights 12/26/2019 12/25/2019 12/23/2019  Weight (lbs) 287 lb 281 lb 6.4 oz 278 lb 7.1 oz  Weight (kg) 130.182 kg 127.642 kg 126.3 kg      Telemetry    A. fib heart rate 101, 6 beats nonsustained VT noted- Personally Reviewed  ECG    No new tracing- Personally Reviewed  Physical Exam   GEN: No acute distress.   Neck:  Difficult to assess Cardiac:  Irregular irregular Respiratory:  [place] 4 GI: Soft, nontender, distended  MS: No edema; No deformity. Neuro:  Nonfocal  Psych:  Normal affect   Labs    High Sensitivity Troponin:   Recent Labs  Lab 12/22/19 1640 12/22/19 1920  TROPONINIHS 3 3      Chemistry Recent Labs  Lab 12/24/19 0453 12/24/19 0453 12/25/19 0459 12/26/19 0459 12/27/19 0928  NA 138   < > 136 137 136  K 3.8   < > 3.3* 4.0 4.2  CL 98   < > 100 100 98  CO2 28   < > 27 28 29   GLUCOSE 125*   < > 128* 159* 218*  BUN 14   < > 13 15 14   CREATININE 0.93   < > 0.83 0.87 0.93  CALCIUM 7.4*   < > 7.3* 7.4* 8.1*  PROT 5.9*  --  5.8* 5.7*  --   ALBUMIN 2.5*  --  2.6* 2.6*  --   AST 22  --  18 21  --   ALT 9  --  8 11  --   ALKPHOS 64  --  63 66  --   BILITOT 1.0  --  0.9 0.7  --   GFRNONAA >60   < > >60 >60 >60  GFRAA >60   < > >60 >60 >  60  ANIONGAP 12   < > 9 9 9    < > = values in this interval not displayed.     Hematology Recent Labs  Lab 12/25/19 0459 12/26/19 0459 12/27/19 0928  WBC 7.6 7.1 6.6  RBC 3.94 3.79* 4.03  HGB 10.9* 10.3* 11.1*  HCT 33.5* 32.4* 35.5*  MCV 85.0 85.5 88.1  MCH 27.7 27.2 27.5  MCHC 32.5 31.8 31.3  RDW 15.7* 15.8* 15.8*  PLT 245 240 239    BNPNo results for input(s): BNP, PROBNP in the last 168 hours.   DDimer No results for input(s): DDIMER in the last 168 hours.   Radiology    No results found.  Cardiac Studies   Echocardiogram 11/2019 1. Left ventricular ejection fraction, by estimation, is >55%. The left  ventricle has normal function. Left ventricular endocardial border not  optimally defined to evaluate regional wall motion. There is severe left  ventricular hypertrophy. Left  ventricular diastolic function could not be evaluated.  2. Right ventricular systolic function is normal. The right ventricular  size is not well visualized. Tricuspid regurgitation signal is inadequate  for assessing PA pressure.  3. The mitral valve is grossly normal. Trivial mitral valve  regurgitation.  4. The aortic valve is grossly normal. Aortic valve regurgitation not  well assessed.  Patient  Profile     69 y.o. female history of persistent atrial fibrillation, bladder tumor status post TURBT, indwelling Foley catheter, diabetes presenting with discolored urine.  Found to be in A. fib with RVR.  Assessment & Plan    1.  Persistent atrial fibrillation -Heart rate is reasonably controlled, 6 beat of nonsustained VT noted. -Increase atenolol to PTA dose of 25 mg daily. -Continue Cardizem -Monitor H&H with reduced dose of Eliquis. -If hematuria continues, we will have no choice but to stop anticoagulation.  2.  Hematuria, indwelling Foley -Monitor H&H -Foley changes, management as per primary.  3.  Bladder tumor -Oncology following.      Signed, Kate Sable, MD  12/27/2019, 2:20 PM

## 2019-12-27 NOTE — Consult Note (Signed)
[place]  Telephone:([phone] [phone]   Name: Brittney Shepard Date: 12/27/2019 MRN: [id number]  DOB: Feb 07, 1951  Patient Care Team: Marguerita Merles, MD as PCP - General (Family Medicine) Kate Sable, MD as PCP - Cardiology (Cardiology) Clent Jacks, RN as Oncology Nurse Navigator    REASON FOR CONSULTATION: Brittney Shepard is a 69 y.o. female with multiple medical problems including history of CHF, COPD, A. fib not on anticoagulation, history of CVA, and morbid obesity, who was incidentally found to have a large bladder mass on imaging following a fall.  Patient subsequently underwent TURBT on 11/20/2019 with ureteral stent placement by Dr. Diamantina Providence and now has chronic indwelling Foley.  Pathology revealed a high-grade urothelial carcinoma.  Patient also underwent biopsy of a liver mass on 11/22/2019 with findings consistent for poorly differentiated carcinoma.    She is found to have PET positive endometrial mass with peritoneal adenopathy concerning for metastatic endometrial cancer.  Patient is not felt to be a surgical candidate for her stage IV cancer.  Patient was hospitalized 12/22/2019  with rapid A. fib and UTI.  Palliative care was consulted help address goals.  SOCIAL HISTORY:     reports that she has never smoked. She has never used smokeless tobacco. She reports that she does not drink alcohol and does not use drugs.  Patient is widowed for the past several years.  She has a son who lives about 30 miles away.  Patient lives at home alone.  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  ADVANCE DIRECTIVES:  Does not have  CODE STATUS: Full code  PAST MEDICAL HISTORY: Past Medical History:  Diagnosis Date  . A-fib ([place])   . Bladder cancer ([place])   . CHF (congestive heart failure) ([place])   . Chronic pain   . Cognitive communication deficit   . Diabetes mellitus without complication  ([place])   . Hypertension   . Hypokalemia   . Muscle weakness (generalized)   . 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 Secondary malignant neoplasm of liver ([place])   . Stroke [facility]) 2013  . Urinary tract infection, site not specified     PAST SURGICAL HISTORY:  Past Surgical History:  Procedure Laterality Date  . CYSTOSCOPY W/ RETROGRADES N/A 11/20/2019   Procedure: CYSTOSCOPY WITH RETROGRADE PYELOGRAM;  Surgeon: Billey Co, MD;  Location: [facility] ORS;  Service: Urology;  Laterality: N/A;  . CYSTOSCOPY WITH STENT PLACEMENT Left 11/20/2019   Procedure: CYSTOSCOPY WITH STENT PLACEMENT;  Surgeon: Billey Co, MD;  Location: [facility] ORS;  Service: Urology;  Laterality: Left;  . TRANSURETHRAL RESECTION OF BLADDER TUMOR N/A 11/20/2019   Procedure: TRANSURETHRAL RESECTION OF BLADDER TUMOR (TURBT);  Surgeon: Billey Co, MD;  Location: [facility] ORS;  Service: Urology;  Laterality: N/A;    HEMATOLOGY/ONCOLOGY HISTORY:  Oncology History Overview Note  # 2021- Liver bx-poorly differentiated CK 7+ carcinoma- DIAGNOSIS:  A. LIVER; ULTRASOUND-GUIDED CORE NEEDLE BIOPSY:  - POSITIVE FOR MALIGNANCY.  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   Comment:  Immunohistochemical studies show tumor cells to be positive for CK7 and  CK19, with focal faint expression of CD20. Tumor cells are negative for  CDX-2, GATA-3, TTF-1, and Pax-8. Additional stains were attempted, but  malignant tissue is exhausted in the core utilized for these studies.   The immunohistochemical pattern is relatively non-specific. The  patient's recent diagnosis of invasive urothelial carcinoma is  noted,  but the histologic pattern does not appear similar, and lack of staining  for GATA3 would suggest against this. Concern for a gynecologic  malignancy is also noted, but lack of marking for Pax-8 would suggest  against this. CK7 positivity may be seen in carcinomas arising in the  upper GI tract, lung,  pancreatobiliary tree, and thymus, among other  sites. Lack of marking for TTF-1 would suggest against lung. The  presence of CK19 staining, while not specific, may be seen in carcinomas  arising from the pancreatobiliary tree, as well as urinary bladder,  endometrium, lung, and kidney. Recent laboratory testing indicates liver  function testing within normal limits. Clinical and radiographic  correlation is recommended.   # JULY 2021- cystocopy- DIAGNOSIS: A. 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  - NO MUSCULARIS PROPRIA IDENTIFIED.   B. 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  - MUSCULARIS PROPRIA PRESENT AND UNINVOLVED.   # ENDOMETRIAL MASS [Dr.Staebler]- unable to Bx- sec to cervical stenosis  # Mediastinal mass  Lobulated anterior mediastinal 4.1 x 3.6 cm soft tissue mass, indeterminate for nodal metastasis versus primary thymic neoplasm. No additional potential findings of metastatic disease in the chest. 3. Mild cardiomegaly. Three-vessel coronary atherosclerosis. 4. Left adrenal myelolipoma. 5. Exophytic isodense 1.3 cm renal cortical lesion in the lateral upper right kidney  # CHF-compensated; morbid obesity; diabetes; COPD [not on O2]; A. Fib- not on anticoagulation.    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[place])  11/22/2019 Initial Diagnosis   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[place])   12/04/2019 -  Chemotherapy   The patient had dexamethasone (DECADRON) 4 MG tablet, 8 mg, Oral, Daily, 0 of 1 cycle, Start date: --, End date: -- palonosetron (ALOXI) injection 0.25 mg, 0.25 mg, Intravenous,  Once, 0 of 6 cycles pegfilgrastim (NEULASTA ONPRO KIT) injection 6 mg, 6 mg, Subcutaneous, Once, 0 of 6 cycles CARBOplatin (PARAPLATIN) in sodium chloride 0.9 % 100 mL  chemo infusion, , Intravenous,  Once, 0 of 6 cycles fosaprepitant (EMEND) 150 mg in sodium chloride 0.9 % 145 mL IVPB, 150 mg, Intravenous,  Once, 0 of 6 cycles PACLitaxel (TAXOL) 558 mg in sodium chloride 0.9 % 500 mL chemo infusion (> 38m/m2), 225 mg/m2, Intravenous,  Once, 0 of 6 cycles  for chemotherapy treatment.    Liver metastases ([place]   Initial Diagnosis   [place] metastases ([place]   12/04/2019 -  Chemotherapy   The patient had dexamethasone (DECADRON) 4 MG tablet, 8 mg, Oral, Daily, 0 of 1 cycle, Start date: --, End date: -- palonosetron (ALOXI) injection 0.25 mg, 0.25 mg, Intravenous,  Once, 0 of 6 cycles pegfilgrastim (NEULASTA ONPRO KIT) injection 6 mg, 6 mg, Subcutaneous, Once, 0 of 6 cycles CARBOplatin (PARAPLATIN) in sodium chloride 0.9 % 100 mL chemo infusion, , Intravenous,  Once, 0 of 6 cycles fosaprepitant (EMEND) 150 mg in sodium chloride 0.9 % 145 mL IVPB, 150 mg, Intravenous,  Once, 0 of 6 cycles PACLitaxel (TAXOL) 558 mg in sodium chloride 0.9 % 500 mL chemo infusion (> 873mm2), 225 mg/m2, Intravenous,  Once, 0 of 6 cycles  for chemotherapy treatment.      ALLERGIES:  is allergic to penicillin g and sulfa antibiotics.  MEDICATIONS:  Current Facility-Administered Medications  Medication Dose Route Frequency Provider Last Rate Last Admin  . 0.9 %  sodium chloride infusion  250 mL Intravenous PRN  Collier Bullock, MD      . acetaminophen (TYLENOL) tablet 650 mg  650 mg Oral Q6H PRN Agbata, Tochukwu, MD      . apixaban (ELIQUIS) tablet 2.5 mg  2.5 mg Oral BID Para Skeans, MD   2.5 mg at 12/27/19 0948  . [START ON 12/28/2019] atenolol (TENORMIN) tablet 25 mg  25 mg Oral Daily Agbor-Etang, Aaron Edelman, MD      . Chlorhexidine Gluconate Cloth 2 % PADS 6 each  6 each Topical Daily Agbata, Tochukwu, MD   6 each at 12/27/19 0948  . diltiazem (CARDIZEM SR) 12 hr capsule 60 mg  60 mg Oral Q12H Agbata, Tochukwu, MD   60 mg at 12/27/19 0947  . furosemide (LASIX) tablet 40 mg  40 mg  Oral BID Agbata, Tochukwu, MD   40 mg at 12/27/19 0948  . insulin aspart (novoLOG) injection 0-20 Units  0-20 Units Subcutaneous TID WC Agbata, Tochukwu, MD   3 Units at 12/27/19 1235  . ipratropium-albuterol (DUONEB) 0.5-2.5 (3) MG/3ML nebulizer solution 3 mL  3 mL Nebulization BID Para Skeans, MD   3 mL at 12/27/19 0759  . multivitamin with minerals tablet 1 tablet  1 tablet Oral Daily Agbata, Tochukwu, MD   1 tablet at 12/27/19 0947  . ondansetron (ZOFRAN) injection 4 mg  4 mg Intravenous Q6H PRN Agbata, Tochukwu, MD      . polyethylene glycol (MIRALAX / GLYCOLAX) packet 17 g  17 g Oral Daily PRN Agbata, Tochukwu, MD      . potassium chloride SA (KLOR-CON) CR tablet 20 mEq  20 mEq Oral BID Theora Gianotti, NP   20 mEq at 12/27/19 0948  . senna-docusate (Senokot-S) tablet 1 tablet  1 tablet Oral BID Agbata, Tochukwu, MD   1 tablet at 12/26/19 2148  . sodium chloride flush (NS) 0.9 % injection 3 mL  3 mL Intravenous Q12H Agbata, Tochukwu, MD   3 mL at 12/27/19 0949  . sodium chloride flush (NS) 0.9 % injection 3 mL  3 mL Intravenous PRN Agbata, Tochukwu, MD        VITAL SIGNS: BP 102/66 (BP Location: Right Arm)   Pulse 94   Temp 97.7 F (36.5 C) (Oral)   Resp 19   Ht 5' 5" (1.651 m)   Wt 287 lb (130.2 kg)   SpO2 100%   BMI 47.76 kg/m  Filed Weights   12/23/19 0421 12/25/19 0433 12/26/19 0338  Weight: 278 lb 7.1 oz (126.3 kg) 281 lb 6.4 oz (127.6 kg) 287 lb (130.2 kg)    Estimated body mass index is 47.76 kg/m as calculated from the following:   Height as of this encounter: 5' 5" (1.651 m).   Weight as of this encounter: 287 lb (130.2 kg).  LABS: CBC:    Component Value Date/Time   WBC 6.6 12/27/2019 0928   HGB 11.1 (L) 12/27/2019 0928   HCT 35.5 (L) 12/27/2019 0928   PLT 239 12/27/2019 0928   MCV 88.1 12/27/2019 0928   NEUTROABS 4.7 12/26/2019 0459   LYMPHSABS 1.2 12/26/2019 0459   MONOABS 0.6 12/26/2019 0459   EOSABS 0.5 12/26/2019 0459   BASOSABS 0.1  12/26/2019 0459   Comprehensive Metabolic Panel:    Component Value Date/Time   NA 136 12/27/2019 0928   K 4.2 12/27/2019 0928   CL 98 12/27/2019 0928   CO2 29 12/27/2019 0928   BUN 14 12/27/2019 0928   CREATININE 0.93 12/27/2019 0928   GLUCOSE 218 (H) 12/27/2019  9937   CALCIUM 8.1 (L) 12/27/2019 0928   AST 21 12/26/2019 0459   ALT 11 12/26/2019 0459   ALKPHOS 66 12/26/2019 0459   BILITOT 0.7 12/26/2019 0459   PROT 5.7 (L) 12/26/2019 0459   ALBUMIN 2.6 (L) 12/26/2019 0459    RADIOGRAPHIC STUDIES: NM PET Image Initial (PI) Skull Base To Thigh  Result Date: 12/12/2019 CLINICAL DATA:  Initial treatment strategy for metastatic disease of unknown primary. Prior trans urethral resection of bladder tumor 11/19/2019. Biopsy of liver 11/22/2019. EXAM: NUCLEAR MEDICINE PET SKULL BASE TO THIGH TECHNIQUE: 14.4 mCi F-18 FDG was injected intravenously. Full-ring PET imaging was performed from the skull base to thigh after the radiotracer. CT data was obtained and used for attenuation correction and anatomic localization. Fasting blood glucose: 137 mg/dl COMPARISON:  Chest CT 11/22/2019. Abdominopelvic MRI 11/21/2019. Abdominopelvic CT of 11/20/2019. FINDINGS: Mediastinal blood pool activity: SUV max 3.3 Liver activity: SUV max NA NECK: No areas of abnormal hypermetabolism. Incidental CT findings: Cerebral atrophy.  No cervical adenopathy. CHEST: No pulmonary parenchymal or thoracic nodal hypermetabolism. No hypermetabolism to correspond to the previously described macrolobulated anterior mediastinal mass. 4.3 x 3.1 cm and 14.5 HU on 80/3. Incidental CT findings: Deferred to recent diagnostic CT. Cardiomegaly. Aortic and coronary artery atherosclerosis. ABDOMEN/PELVIS: Segment 4 B hypermetabolic liver mass measures 4.9 x 3.9 cm and a S.U.V. max of 22.0 on 145/3. Porta hepatis nodal metastasis at 1.2 cm and a S.U.V. max of 10.2 on 135/3. More equivocal abdominal retroperitoneal nodes, including at 1.1 cm and  a S.U.V. max of 3.6 in the pre caval space on 151/3. No pelvic nodal hypermetabolism. Hypermetabolism corresponding to endometrial soft tissue fullness and heterogeneity. Example at on the order of 4.5 cm and a S.U.V. max of 14.1 on 220/3. The bladder is not well evaluated secondary to physiologic urinary hypermetabolism. There is also urinary contamination about the perineum. Incidental CT findings: Deferred to recent CT and MRI. Abdominal aortic atherosclerosis. Foley catheter within the urinary bladder. Left ureteric stent without significant hydronephrosis. SKELETON: No abnormal marrow activity. Incidental CT findings: none IMPRESSION: 1. Hypermetabolic endometrial mass, consistent with carcinoma. 2. Metastatic disease to the liver and upper abdominal node or nodes as detailed above. Given the distribution of metastasis, and the extent of endometrial hypermetabolism, endometrial primary is favored. 3. No thoracic hypermetabolic metastasis. The macrolobulated anterior mediastinal mass is not significantly hypermetabolic. Favor pericardial or bronchogenic cyst. Thymoma or less likely thymic carcinoma cannot be entirely excluded but are felt less likely. Given comorbidities, of questionable clinical significance. Consider chest CT follow-up at 3 months. Electronically Signed   By: Abigail Miyamoto M.D.   On: 12/12/2019 14:46   DG Chest Port 1 View  Result Date: 12/22/2019 CLINICAL DATA:  69 year old female with possible sepsis. Atrial fibrillation with RVR. EXAM: PORTABLE CHEST 1 VIEW COMPARISON:  Chest CT 11/21/2019 and earlier. FINDINGS: Portable AP upright view at 1209 hours. Stable cardiomegaly and mediastinal contours. Normal lung volumes. Visualized tracheal air column is within normal limits. Chronic retrocardiac hypo ventilation appears stable. Elsewhere Allowing for portable technique the lungs are clear. No pneumothorax. No acute osseous abnormality identified. Paucity of bowel gas in the upper abdomen.  IMPRESSION: Stable cardiomegaly. No acute cardiopulmonary abnormality. Electronically Signed   By: Genevie Ann M.D.   On: 12/22/2019 12:33    PERFORMANCE STATUS (ECOG) : 2 - Symptomatic, <50% confined to bed  Review of Systems Unless otherwise noted, a complete review of systems is negative.  Physical Exam General: NAD,  obesity Cardiovascular: Irregular Pulmonary: clear ant fields Abdomen: soft, nontender, + bowel sounds GU: no suprapubic tenderness Extremities: no edema, no joint deformities Skin: no rashes Neurological: Weakness but otherwise nonfocal  IMPRESSION: Met with patient to discuss goals.  I reintroduced palliative care.  Patient states that overall she is feeling better.  She denies any symptomatic complaints at present.  She is aware that she has stage IV cancer and treatment options are being considered.  Patient is felt to be a borderline candidate for chemotherapy.  As of today, patient remains undecided on whether to pursue treatment.  I did discuss with her the option of hospice care at home.  However, she would likely benefit from further discussion in the [place] following discharge from the hospital to further clarify goals.   Patient says she is disappointed that she does not qualify for rehab.  Patient is being referred for home health.  She has limited social support at home with her closest family about 30 minutes away.  She does have both Medicare and Medicaid and would qualify for additional help through PACE or CAPS/PCS.  It appears that home health social work has been ordered and hopefully can help [place] coordinate additional resources at home if needed.  Patient verbalized understanding that resuscitation would likely prove futile in the setting of advanced cancer but she remained undecided regarding CODE STATUS.  She says that she would want her son involved in decision-making if needed.  PLAN: -Continue current scope of treatment -Home health/home  PC -Will plan follow-up in the clinic to further address conversations regarding goals. Patient is scheduled to see Dr. Janese Banks and me on 12/19/19   Patient expressed understanding and was in agreement with this plan. She also understands that She can call the clinic at any time with any questions, concerns, or complaints.     Time Total: 60 minutes  Visit consisted of counseling and education dealing with the complex and emotionally intense issues of symptom management and palliative care in the setting of serious and potentially life-threatening illness.Greater than 50%  of this time was spent counseling and coordinating care related to the above assessment and plan.  Signed by: Altha Harm, PhD, NP-C

## 2019-12-27 NOTE — TOC Progression Note (Signed)
Transition of Care [facility]) - Progression Note    Patient Details  Name: Brittney Shepard MRN: [id number] Date of Birth: 02-17-1951  Transition of Care [facility]) CM/SW Contact  Eileen Stanford, LCSW Phone Number: 12/27/2019, 4:14 PM  Clinical Narrative:  CSW spoke with pt's son and he is aware the only option is for pt to d/c home with home health. CSW has arranged [facility].          Expected Discharge Plan and Services           Expected Discharge Date: 12/27/19                                     Social Determinants of Health (SDOH) Interventions    Readmission Risk Interventions No flowsheet data found.

## 2019-12-27 NOTE — Plan of Care (Signed)

## 2019-12-28 ENCOUNTER — Telehealth: Payer: Self-pay | Admitting: Nurse Practitioner

## 2019-12-28 NOTE — Telephone Encounter (Signed)
Spoke with patient regarding Palliative services and she was in agreement with this.  I have scheduled an In-person Consult for 01/04/20 @ 2:30 PM.

## 2019-12-29 ENCOUNTER — Inpatient Hospital Stay: Payer: Medicare Other | Admitting: Hospice and Palliative Medicine

## 2019-12-29 ENCOUNTER — Other Ambulatory Visit: Payer: Self-pay

## 2019-12-29 ENCOUNTER — Telehealth: Payer: Self-pay | Admitting: Oncology

## 2019-12-29 ENCOUNTER — Inpatient Hospital Stay: Payer: Medicare Other | Admitting: Oncology

## 2019-12-29 ENCOUNTER — Encounter: Payer: Self-pay | Admitting: Emergency Medicine

## 2019-12-29 DIAGNOSIS — R300 Dysuria: Secondary | ICD-10-CM | POA: Diagnosis not present

## 2019-12-29 DIAGNOSIS — C55 Malignant neoplasm of uterus, part unspecified: Secondary | ICD-10-CM | POA: Diagnosis not present

## 2019-12-29 DIAGNOSIS — I110 Hypertensive heart disease with heart failure: Secondary | ICD-10-CM | POA: Diagnosis not present

## 2019-12-29 DIAGNOSIS — Z79899 Other long term (current) drug therapy: Secondary | ICD-10-CM | POA: Insufficient documentation

## 2019-12-29 DIAGNOSIS — Z7901 Long term (current) use of anticoagulants: Secondary | ICD-10-CM | POA: Diagnosis not present

## 2019-12-29 DIAGNOSIS — Z7984 Long term (current) use of oral hypoglycemic drugs: Secondary | ICD-10-CM | POA: Insufficient documentation

## 2019-12-29 DIAGNOSIS — E119 Type 2 diabetes mellitus without complications: Secondary | ICD-10-CM | POA: Diagnosis not present

## 2019-12-29 DIAGNOSIS — I5032 Chronic diastolic (congestive) heart failure: Secondary | ICD-10-CM | POA: Insufficient documentation

## 2019-12-29 DIAGNOSIS — J441 Chronic obstructive pulmonary disease with (acute) exacerbation: Secondary | ICD-10-CM | POA: Insufficient documentation

## 2019-12-29 LAB — BASIC METABOLIC PANEL
Anion gap: 12 (ref 5–15)
BUN: 14 mg/dL (ref 8–23)
CO2: 27 mmol/L (ref 22–32)
Calcium: 8.3 mg/dL — ABNORMAL LOW (ref 8.9–10.3)
Chloride: 99 mmol/L (ref 98–111)
Creatinine, Ser: 0.96 mg/dL (ref 0.44–1.00)
GFR calc Af Amer: >60 mL/min (ref >60)
GFR calc non Af Amer: 60 mL/min — ABNORMAL LOW (ref >60)
Glucose, Bld: 148 mg/dL — ABNORMAL HIGH (ref 70–99)
Potassium: 4.3 mmol/L (ref 3.5–5.1)
Sodium: 138 mmol/L (ref 135–145)

## 2019-12-29 LAB — CBC
HCT: 38.1 % (ref 36.0–46.0)
Hemoglobin: 11.8 g/dL — ABNORMAL LOW (ref 12.0–15.0)
MCH: 27.4 pg (ref 26.0–34.0)
MCHC: 31 g/dL (ref 30.0–36.0)
MCV: 88.6 fL (ref 80.0–100.0)
Platelets: 308 10*3/uL (ref 150–400)
RBC: 4.3 MIL/uL (ref 3.87–5.11)
RDW: 16.1 % — ABNORMAL HIGH (ref 11.5–15.5)
WBC: 8.7 10*3/uL (ref 4.0–10.5)
nRBC: 0 % (ref 0.0–0.2)

## 2019-12-29 NOTE — ED Triage Notes (Signed)
Pt presents to ED via [facility] with c/o burning with urination. Pt recently seen on 8/20 and admitting to hospital for UTI. Pt with foley that remains in place at this time. Pt states continued burning with urination. Pt states foley changed on out 8/20 by ED staff.

## 2019-12-29 NOTE — Telephone Encounter (Signed)
Patient missed appt on this date. Writer phoned patient and patient stated that she did not have transportation. Appts rescheduled to 01-01-20. Patient stated that she would work on getting transportation to her appt.

## 2019-12-30 ENCOUNTER — Emergency Department
Admission: EM | Admit: 2019-12-30 | Discharge: 2019-12-30 | Disposition: A | Discharge status: Home / Self Care | Payer: Medicare Other | Attending: Emergency Medicine | Admitting: Emergency Medicine

## 2019-12-30 DIAGNOSIS — R300 Dysuria: Secondary | ICD-10-CM

## 2019-12-30 LAB — URINALYSIS, COMPLETE (UACMP) WITH MICROSCOPIC
Bacteria, UA: NONE SEEN
Bilirubin Urine: NEGATIVE
Glucose, UA: NEGATIVE mg/dL
Ketones, ur: NEGATIVE mg/dL
Nitrite: NEGATIVE
Protein, ur: >=300 mg/dL — AB
RBC / HPF: >50 RBC/hpf — ABNORMAL HIGH (ref 0–5)
Specific Gravity, Urine: 1.023 (ref 1.005–1.030)
Squamous Epithelial / HPF: NONE SEEN (ref 0–5)
WBC, UA: >50 WBC/hpf — ABNORMAL HIGH (ref 0–5)
pH: 6 (ref 5.0–8.0)

## 2019-12-30 MED ORDER — PHENAZOPYRIDINE HCL 200 MG PO TABS: 200.0000 mg | ORAL_TABLET | Freq: Three times a day (TID) | ORAL | 0 refills | Status: AC

## 2019-12-30 MED ORDER — PHENAZOPYRIDINE HCL 200 MG PO TABS
200.0000 mg | ORAL_TABLET | Freq: Once | ORAL | Status: AC
  Administered 2019-12-30: 200 mg via ORAL
  Filled 2019-12-30: qty 1

## 2019-12-30 NOTE — Discharge Instructions (Addendum)
Your urine test is negative for an infection, Take pyridium as prescribed for the next 3 days. Drink plenty of fluids. Contact your Urologist on Monday for further management, Return to the ER for abdominal pain, nausea, vomiting, flank pain, or fever.

## 2019-12-30 NOTE — ED Notes (Signed)
Pt with foley placed on last Monday and now c/o pain 10/10 sharp burning when "I have to pee", foley placed for retention

## 2019-12-30 NOTE — ED Provider Notes (Signed)
[facility] Emergency Department Provider Note  ____________________________________________  Time seen: Approximately 3:53 AM  I have reviewed the triage vital signs and the nursing notes.   HISTORY  Chief Complaint Dysuria   HPI Brittney Shepard is a 69 y.o. 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 fib on Eliquis, diabetes, hypertension, and newly diagnosed metastatic endometrial cancer not on chemotherapy who presents for evaluation of dysuria.  Patient reports that since leaving the hospital 3 days ago she has had dysuria which she describes as burning around the Foley catheter.  She denies abdominal pain, flank pain, nausea or vomiting, fever or chills.  She reports that her symptoms are only present when she is urinating.  She is not on antibiotics.  Her symptoms are moderate and constant.   Past Medical History:  Diagnosis Date  . A-fib ([place])   . Bladder cancer ([place])   . CHF (congestive heart failure) ([place])   . Chronic pain   . Cognitive communication deficit   . Diabetes mellitus without complication ([place])   . Hypertension   . Hypokalemia   . Muscle weakness (generalized)   . 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 Secondary malignant neoplasm of liver ([place])   . Stroke [facility]) 2013  . Urinary tract infection, site not specified     Patient Active Problem List   Diagnosis Date Noted  . Palliative care encounter   . Hypokalemia 12/22/2019  . Endometrial cancer ([place]) 12/20/2019  . Goals of care, counseling/discussion 12/04/2019  . 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 Weakness   . Liver metastases ([place])   . Essential hypertension   . Type 2 diabetes mellitus without complication, without long-term current use of insulin ([place])   . Morbid obesity with BMI of 45.0-49.9, adult ([place])   . Atrial fibrillation ([place]) 11/19/2019  . Hematuria, gross    . Postmenopausal bleeding   . Abnormal endometrial ultrasound   . Atrial fibrillation with RVR ([place]) 11/18/2019  . Bladder mass   . Fall   . Acute cystitis without hematuria   . Chronic diastolic CHF (congestive heart failure) ([place]) 05/27/2016  . Primary osteoarthritis of both knees 05/27/2016  . Progressive anemia 05/27/2016  . DNR (do not resuscitate)   . Palliative care by specialist   . Acute respiratory failure with hypoxia and hypercapnia ([place])   . Atrial fibrillation with rapid ventricular response ([place]) 03/20/2016  . Chronic obstructive pulmonary disease with acute exacerbation [facility])     Past Surgical History:  Procedure Laterality Date  . CYSTOSCOPY W/ RETROGRADES N/A 11/20/2019   Procedure: CYSTOSCOPY WITH RETROGRADE PYELOGRAM;  Surgeon: Billey Co, MD;  Location: [facility] ORS;  Service: Urology;  Laterality: N/A;  . CYSTOSCOPY WITH STENT PLACEMENT Left 11/20/2019   Procedure: CYSTOSCOPY WITH STENT PLACEMENT;  Surgeon: Billey Co, MD;  Location: [facility] ORS;  Service: Urology;  Laterality: Left;  . TRANSURETHRAL RESECTION OF BLADDER TUMOR N/A 11/20/2019   Procedure: TRANSURETHRAL RESECTION OF BLADDER TUMOR (TURBT);  Surgeon: Billey Co, MD;  Location: [facility] ORS;  Service: Urology;  Laterality: N/A;    Prior to Admission medications   Medication Sig Start Date End Date Taking? Authorizing Provider  acetaminophen (TYLENOL) 325 MG tablet Take 2 tablets (650 mg total) by mouth every 6 (six) hours as needed for mild pain (or Fever >/= 101). 03/27/16   Bettey Costa, MD  apixaban (  ELIQUIS) 2.5 MG TABS tablet Take 1 tablet (2.5 mg total) by mouth 2 (two) times daily. 12/27/19   Nicole Kindred A, DO  atenolol (TENORMIN) 25 MG tablet Take 1 tablet (25 mg total) by mouth daily. 12/28/19   Ezekiel Slocumb, DO  diltiazem (CARDIZEM SR) 60 MG 12 hr capsule Take 1 capsule (60 mg total) by mouth every 12 (twelve) hours. Patient taking differently: Take 60 mg by mouth 2 (two) times  daily.  03/27/16   Bettey Costa, MD  furosemide (LASIX) 40 MG tablet Take 1 tablet (40 mg total) by mouth 2 (two) times daily. 03/27/16   Bettey Costa, MD  ipratropium-albuterol (DUONEB) 0.5-2.5 (3) MG/3ML SOLN Take 3 mLs by nebulization 3 (three) times daily. 12/27/19   Ezekiel Slocumb, DO  metFORMIN (GLUCOPHAGE) 500 MG tablet Take 500 mg by mouth daily with breakfast.     [provider]  Multiple Vitamin (MULTIVITAMIN) tablet Take 1 tablet by mouth daily.    [provider]  phenazopyridine (PYRIDIUM) 200 MG tablet Take 1 tablet (200 mg total) by mouth in the morning, at noon, and at bedtime for 3 days. 12/30/19 01/02/20  Rudene Re, MD  polyethylene glycol (MIRALAX / GLYCOLAX) 17 g packet Take 17 g by mouth daily as needed for mild constipation. 12/27/19   Ezekiel Slocumb, DO  potassium chloride (KLOR-CON) 10 MEQ tablet Take 10 mEq by mouth 2 (two) times daily.    [provider]  senna-docusate (SENOKOT-S) 8.6-50 MG tablet Take 1 tablet by mouth 2 (two) times daily. 12/27/19   Ezekiel Slocumb, DO    Allergies Penicillin g and Sulfa antibiotics  Family History  Problem Relation Age of Onset  . Ovarian cancer Mother   . Cancer Sister     Social History Social History   Tobacco Use  . Smoking status: Never Smoker  . Smokeless tobacco: Never Used  Substance Use Topics  . Alcohol use: No  . Drug use: No    Review of Systems  Constitutional: Negative for fever. Eyes: Negative for visual changes. ENT: Negative for sore throat. Neck: No neck pain  Cardiovascular: Negative for chest pain. Respiratory: Negative for shortness of breath. Gastrointestinal: Negative for abdominal pain, vomiting or diarrhea. Genitourinary: + dysuria. Musculoskeletal: Negative for back pain. Skin: Negative for rash. Neurological: Negative for headaches, weakness or numbness. Psych: No SI or HI  ____________________________________________   PHYSICAL  EXAM:  VITAL SIGNS: Vitals:   12/29/19 1917 12/30/19 0307  BP: 109/85 128/78  Pulse: 80 (!) 54  Resp: 18 18  Temp: 97.7 F (36.5 C)   SpO2: 97% 100%     Constitutional: Alert and oriented. Well appearing and in no apparent distress. HEENT:      Head: Normocephalic and atraumatic.         Eyes: Conjunctivae are normal. Sclera is non-icteric.       Mouth/Throat: Mucous membranes are moist.       Neck: Supple with no signs of meningismus. Cardiovascular: Regular rate and rhythm.  Respiratory: Normal respiratory effort.  Gastrointestinal: Soft, non tender, and non distended with positive bowel sounds. No rebound or guarding. Genitourinary: Foley draining hematuria Musculoskeletal:  No edema, cyanosis, or erythema of extremities. Neurologic: Normal speech and language. Face is symmetric. Moving all extremities. No gross focal neurologic deficits are appreciated. Skin: Skin is warm, dry and intact. No rash noted. Psychiatric: Mood and affect are normal. Speech and behavior are normal.  ____________________________________________   LABS (all labs ordered  are listed, but only abnormal results are displayed)  Labs Reviewed  URINALYSIS, COMPLETE (UACMP) WITH MICROSCOPIC - Abnormal; Notable for the following components:      Result Value   Color, Urine AMBER (*)    APPearance CLOUDY (*)    Hgb urine dipstick LARGE (*)    Protein, ur >=300 (*)    Leukocytes,Ua MODERATE (*)    RBC / HPF >50 (*)    WBC, UA >50 (*)    All other components within normal limits  BASIC METABOLIC PANEL - Abnormal; Notable for the following components:   Glucose, Bld 148 (*)    Calcium 8.3 (*)    GFR calc non Af Amer 60 (*)    All other components within normal limits  CBC - Abnormal; Notable for the following components:   Hemoglobin 11.8 (*)    RDW 16.1 (*)    All other components within normal limits  URINE CULTURE   ____________________________________________  EKG  none   ____________________________________________  RADIOLOGY  none  ____________________________________________   PROCEDURES  Procedure(s) performed: None Procedures Critical Care performed:  None ____________________________________________   INITIAL IMPRESSION / ASSESSMENT AND PLAN / ED COURSE  69 y.o. 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 fib on Eliquis, diabetes, hypertension, and newly diagnosed metastatic endometrial cancer not on chemotherapy who presents for evaluation of dysuria.  Patient is well-appearing in no distress with normal vital signs, afebrile, no tachycardia, no abdominal pain or tenderness, no flank pain or tenderness, no fever, nausea or vomiting, no white count.  She does have hematuria and a Foley catheter which she has had since most recent admission.  She is hemodynamically stable with stable hemoglobin.  Urinalysis negative for UTI.  Will start patient on Pyridium and refer her back to her urologist for close follow-up.  Discussed my standard return precautions.  Old medical records reviewed       _____________________________________________ Please note:  Patient was evaluated in Emergency Department today for the symptoms described in the history of present illness. Patient was evaluated in the context of the global COVID-19 pandemic, which necessitated consideration that the patient might be at risk for infection with the SARS-CoV-2 virus that causes COVID-19. Institutional protocols and algorithms that pertain to the evaluation of patients at risk for COVID-19 are in a state of rapid change based on information released by regulatory bodies including the CDC and federal and state organizations. These policies and algorithms were followed during the patient's care in the ED.  Some ED evaluations and interventions may be delayed as a result of limited staffing during the  pandemic.   [place] Controlled Substance Database was reviewed by me. ____________________________________________   FINAL CLINICAL IMPRESSION(S) / ED DIAGNOSES   Final diagnoses:  Dysuria      NEW MEDICATIONS STARTED DURING THIS VISIT:  ED Discharge Orders         Ordered    phenazopyridine (PYRIDIUM) 200 MG tablet  3 times daily        12/30/19 0351           Note:  This document was prepared using Dragon voice recognition software and may include unintentional dictation errors.    Alfred Levins, [place], MD 12/30/19 [phone]

## 2019-12-31 LAB — URINE CULTURE: Culture: NO GROWTH

## 2020-01-01 ENCOUNTER — Inpatient Hospital Stay: Payer: Medicare Other | Admitting: Hospice and Palliative Medicine

## 2020-01-01 ENCOUNTER — Inpatient Hospital Stay: Payer: Medicare Other | Admitting: Oncology

## 2020-01-04 ENCOUNTER — Other Ambulatory Visit: Payer: Self-pay

## 2020-01-04 ENCOUNTER — Other Ambulatory Visit: Payer: Medicare Other | Admitting: Nurse Practitioner

## 2020-01-04 ENCOUNTER — Encounter: Payer: Self-pay | Admitting: Nurse Practitioner

## 2020-01-04 DIAGNOSIS — Z515 Encounter for palliative care: Secondary | ICD-10-CM

## 2020-01-04 DIAGNOSIS — C679 Malignant neoplasm of bladder, unspecified: Secondary | ICD-10-CM

## 2020-01-04 NOTE — Progress Notes (Signed)
[facility] Consult Note Telephone: [phone]  Fax: [phone]  PATIENT NAME: Brittney Shepard DOB: 03-01-1951 MRN: [id number]  PRIMARY CARE PROVIDER:   Marguerita Merles, MD  REFERRING PROVIDER:  Marguerita Merles, [address],  [place] [address]  I was asked by Dr Lennox Grumbles to see Ms. Brittney Shepard for Palliative consult for goals of care  RESPONSIBLE PARTY:   Self; Teala Daffron son  1. Advance Care Planning; DNR in [place] 2. Goals of Care: Goals include to maximize quality of life and symptom management. Our advance care planning conversation included a discussion about:     The value and importance of advance care planning   Exploration of personal, cultural or spiritual beliefs that might influence medical decisions   Exploration of goals of care in the event of a sudden injury or illness   Identification and preparation of a healthcare agent   Review and updating or creation of an  advance directive document. 3. Palliative care encounter; Palliative care encounter; Palliative medicine team will continue to support patient, patient's family, and medical team. Visit consisted of counseling and education dealing with the complex and emotionally intense issues of symptom management and palliative care in the setting of serious and potentially life-threatening illness  4. f/u 1 week after Oncology appointment to determine if wishes are to pursue Chemotherapy or Hospice  I spent 120 minutes providing this consultation,  from 2:30pm to 4"30pm. More than 50% of the time in this consultation was spent coordinating communication.   HISTORY OF PRESENT ILLNESS:  Brittney Shepard is a 69 y.o. year old female with multiple medical problems including Bladder cancer s/p transurethral resection of bladder tumor  with secondary malignant neoplasm of liver, stroke, congestive heart failure, atrial fibrillation, diabetes, hypertension, chronic pain.  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 Atrial fibrillation on cardizem with Atenolol no anticoagulation at this time. Acquired thrombophilia secondary to atrial fibrillation. Falls. Increase endometrial thickening suggestive with MRI of endometrial mass. BMI 48.72. Hospitalize 8 / 20 / 2021 to 8 / 25 / 2021 for atrial fibrillation with rapid ventricular response. Palliative care was consulted during this admission with noted that the ology from liver biopsy revealed a high grade urothelial carcinoma and underwent biopsy of liver mass with consistent for poor differential carcinoma. Found have a pet positive endometrial mass with peritoneal adenopathy concerning for metastatic endometrial cancer. Not felt to be a good surgical candidate for her stage IV cancer. She was discharged home with Home Health and appears the [place] worker is trying to coordinate additional Services through [facility] or CAPS/PCS. Undecided code status so remains a full code. Dr. Janese Banks Oncologist did say during hospitalization her plan non-metastatic non muscular invasive bladder cancer with history of hematuria undergone turbt. Liver metastases with possible endometrial cancer with performance status borderline for chemotherapy which is palliative not Curative. Weekly carbo/taxol could be attempted they somehow she tolerates it best supportive care and hospice is reasonable alternative. She is widowed for the past several years. Has a son who works about 30 miles away. She lives at home alone. 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 Initial palliative care visit for discussion of goals of care, code status. I met with Brittney Shepard in person. We talked about purpose of palliative care and in agreement. We talked about how she was feeling today.  Brittney Shepard was lying in bed, had her walker in front of her. Ms Shepard does have an indwelling foley. Ms Shepard  endorses she has been having some trouble with bladder spasms though the [place] nurse has been addressing this with primary. We talked about shortness of breath that she does experience with exertion walking with her walker. Therapy is not going to start until next week. We talked about her appetite which is poor. We talked about her functional abilities as she is able to ambulate with a walker. No falls since she has returned home. We talked about the challenges involved in her limited mobility. We talked about past medical history in the same chronic disease with progression. We talked about new diagnosis of cancer within the last four weeks. We talked about both hospitalizations. We talked about Life review. We talked about diagnosis of cancer terminal an option of chemotherapy to be palliative not curative. Ms Shepard talked about her understanding of her cancer. Ms Shepard endorses she is very familiar with cancer. Brittney Shepard has had multiple family members passed from cancers. Ms Shepard talked about her husband passing 2 years ago with cancer and he refused chemotherapy. He did pass at the [facility] as she is familiar with Hospice Services. We talked about her sister in law that passed from cancer who did undergo chemotherapy and was sick the whole time. Brittney Shepard and I talked about medical goals of care. We talked about quality-of-life. We talked about was suffering does look like to her. We talked about coming on ecology appointment which will be Tuesday at 1:30. We talked about if she does choose not to pursue chemotherapy than hospice would be an option for extra Services. We talked about what services hospice does provide under the Medicare benefit. We talked about her son Heron Sabins who is her caregiver and support person although he does live about 30 miles from her. Currently Ms Shepard is living independently. We talked about code status including full code vs. DNR. Ms Shepard endorses she would not  want to have CPR. Asked if it was okay to contact her son Heron Sabins and do a conference call with Brittney Shepard myself and Ms Feeley. Ms Eckman in agreement. I called Brittney Shepard. We talked about purpose of palliative care visit. We talked about recent diagnosis of cancer. We talked about family history of cancer. We talked about service is currently receiving through [facility] and a service. We talked about symptoms, appetite. Brittney Shepard endorses the nurse at [facility] told him that he may have to report to APS the current living situation with her level of care and the state that the house is in felt like it could be condemnable possibly. Brittney Shepard and or says the houses of old family house and it's not just in Ms Creelman's name is in multiple people's. Brittney Shepard endorses it is hard to put money into a house that is not your own. We talked about safe living conditions. We talked about a lot of clutter and stuff that does take up a lot of space though she does have clear walking space. We talked about what that would look like should APS contact him to do an investigation. We talked about medical goals of care including options for placement, PCS Services or CAPS. That was supposed to be initiated at discharge from hospital is a shame her case manager knows. We talked about chemotherapy versus no chemotherapy. We talked about option of Hospice Services. Brittney Shepard endorses he  would not want her to suffer with chemotherapy but the ultimate decision is Ms Jansma. We talked about full code status versus DNR. Brittney Shepard endorses it is Ms Concannon's decision. Ms Pacha verbalize that she would not want CPR. Brittney Shepard and Ms Milo both in agreement to DNR, [place] form completed, placed copy placed in vynca and home. We talked about role of palliative care and plan of care. Discuss that will call after Oncology appointment next Tuesday to find out what the result is whether they're going to continue therapy and pursue chemotherapy will schedule a follow-up palliative  care visit. If the option chosen is to go with hospice will assist with referral. Brittney Shepard and Ms. Haughey in agreement. Therapeutic listening and emotional support provided. Contact information. Questions answered to satisfaction. Palliative Care was asked to help address goals of care.   CODE STATUS: DNR  PPS: 40% HOSPICE ELIGIBILITY/DIAGNOSIS: TBD  PAST MEDICAL HISTORY:  Past Medical History:  Diagnosis Date  . A-fib ([place])   . Bladder cancer ([place])   . CHF (congestive heart failure) ([place])   . Chronic pain   . Cognitive communication deficit   . Diabetes mellitus without complication ([place])   . Hypertension   . Hypokalemia   . Muscle weakness (generalized)   . 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 Secondary malignant neoplasm of liver ([place])   . Stroke [facility]) 2013  . Urinary tract infection, site not specified     SOCIAL HX:  Social History   Tobacco Use  . Smoking status: Never Smoker  . Smokeless tobacco: Never Used  Substance Use Topics  . Alcohol use: No    ALLERGIES:  Allergies  Allergen Reactions  . Penicillin G Hives  . Sulfa Antibiotics Rash     PERTINENT MEDICATIONS:  Outpatient Encounter Medications as of 01/04/2020  Medication Sig  . acetaminophen (TYLENOL) 325 MG tablet Take 2 tablets (650 mg total) by mouth every 6 (six) hours as needed for mild pain (or Fever >/= 101).  Marland Kitchen apixaban (ELIQUIS) 2.5 MG TABS tablet Take 1 tablet (2.5 mg total) by mouth 2 (two) times daily.  Marland Kitchen atenolol (TENORMIN) 25 MG tablet Take 1 tablet (25 mg total) by mouth daily.  Marland Kitchen diltiazem (CARDIZEM SR) 60 MG 12 hr capsule Take 1 capsule (60 mg total) by mouth every 12 (twelve) hours. (Patient taking differently: Take 60 mg by mouth 2 (two) times daily. )  . furosemide (LASIX) 40 MG tablet Take 1 tablet (40 mg total) by mouth 2 (two) times daily.  Marland Kitchen ipratropium-albuterol (DUONEB) 0.5-2.5 (3) MG/3ML SOLN Take 3 mLs by nebulization 3 (three) times daily.  . metFORMIN (GLUCOPHAGE) 500  MG tablet Take 500 mg by mouth daily with breakfast.   . Multiple Vitamin (MULTIVITAMIN) tablet Take 1 tablet by mouth daily.  . polyethylene glycol (MIRALAX / GLYCOLAX) 17 g packet Take 17 g by mouth daily as needed for mild constipation.  . potassium chloride (KLOR-CON) 10 MEQ tablet Take 10 mEq by mouth 2 (two) times daily.  Marland Kitchen senna-docusate (SENOKOT-S) 8.6-50 MG tablet Take 1 tablet by mouth 2 (two) times daily.   No facility-administered encounter medications on file as of 01/04/2020.    PHYSICAL EXAM:   General: obese, debilitated, chronically ill pleasant female Cardiovascular: regular rate and rhythm Pulmonary: clear ant fields Abdomen: obese soft, nontender, + bowel sounds Neurological: generalized weakness; uses walker  Dionicio Shelnutt Ihor Gully, NP

## 2020-01-09 ENCOUNTER — Inpatient Hospital Stay: Payer: Medicare Other | Admitting: Hospice and Palliative Medicine

## 2020-01-09 ENCOUNTER — Inpatient Hospital Stay: Payer: Medicare Other | Admitting: Oncology

## 2020-01-12 ENCOUNTER — Telehealth: Payer: Self-pay | Admitting: Family Medicine

## 2020-01-12 NOTE — Telephone Encounter (Signed)
Luellen Pucker from [facility] [phone] called and states patient had a catheter place before she left [facility] and sent her to the hospital. The ED changed her catheter. She has an appointment with Oncology on Monday. They will discuss the plan and if they are not wanting patient to keep catheter Luellen Pucker is to contact our office to schedule an appointment for catheter.

## 2020-01-13 ENCOUNTER — Encounter: Payer: Self-pay | Admitting: Emergency Medicine

## 2020-01-13 ENCOUNTER — Emergency Department
Admission: EM | Admit: 2020-01-13 | Discharge: 2020-01-13 | Disposition: A | Discharge status: Home / Self Care | Payer: Medicare Other | Attending: Emergency Medicine | Admitting: Emergency Medicine

## 2020-01-13 ENCOUNTER — Other Ambulatory Visit: Payer: Self-pay

## 2020-01-13 DIAGNOSIS — I5032 Chronic diastolic (congestive) heart failure: Secondary | ICD-10-CM | POA: Diagnosis not present

## 2020-01-13 DIAGNOSIS — Z960 Presence of urogenital implants: Secondary | ICD-10-CM | POA: Insufficient documentation

## 2020-01-13 DIAGNOSIS — Z7901 Long term (current) use of anticoagulants: Secondary | ICD-10-CM | POA: Diagnosis not present

## 2020-01-13 DIAGNOSIS — R300 Dysuria: Secondary | ICD-10-CM | POA: Diagnosis present

## 2020-01-13 DIAGNOSIS — Z8542 Personal history of malignant neoplasm of other parts of uterus: Secondary | ICD-10-CM | POA: Insufficient documentation

## 2020-01-13 DIAGNOSIS — Z978 Presence of other specified devices: Secondary | ICD-10-CM

## 2020-01-13 DIAGNOSIS — J441 Chronic obstructive pulmonary disease with (acute) exacerbation: Secondary | ICD-10-CM | POA: Diagnosis not present

## 2020-01-13 DIAGNOSIS — E119 Type 2 diabetes mellitus without complications: Secondary | ICD-10-CM | POA: Insufficient documentation

## 2020-01-13 DIAGNOSIS — Z8505 Personal history of malignant neoplasm of liver: Secondary | ICD-10-CM | POA: Diagnosis not present

## 2020-01-13 DIAGNOSIS — N3001 Acute cystitis with hematuria: Secondary | ICD-10-CM | POA: Diagnosis not present

## 2020-01-13 DIAGNOSIS — Z7984 Long term (current) use of oral hypoglycemic drugs: Secondary | ICD-10-CM | POA: Insufficient documentation

## 2020-01-13 DIAGNOSIS — I110 Hypertensive heart disease with heart failure: Secondary | ICD-10-CM | POA: Insufficient documentation

## 2020-01-13 DIAGNOSIS — Z79899 Other long term (current) drug therapy: Secondary | ICD-10-CM | POA: Insufficient documentation

## 2020-01-13 LAB — BASIC METABOLIC PANEL
Anion gap: 10 (ref 5–15)
BUN: 14 mg/dL (ref 8–23)
CO2: 24 mmol/L (ref 22–32)
Calcium: 8.3 mg/dL — ABNORMAL LOW (ref 8.9–10.3)
Chloride: 102 mmol/L (ref 98–111)
Creatinine, Ser: 1.34 mg/dL — ABNORMAL HIGH (ref 0.44–1.00)
GFR calc Af Amer: 46 mL/min — ABNORMAL LOW (ref >60)
GFR calc non Af Amer: 40 mL/min — ABNORMAL LOW (ref >60)
Glucose, Bld: 190 mg/dL — ABNORMAL HIGH (ref 70–99)
Potassium: 4.3 mmol/L (ref 3.5–5.1)
Sodium: 136 mmol/L (ref 135–145)

## 2020-01-13 LAB — URINALYSIS, COMPLETE (UACMP) WITH MICROSCOPIC
Bilirubin Urine: NEGATIVE
Bilirubin Urine: NEGATIVE
Glucose, UA: NEGATIVE mg/dL
Glucose, UA: NEGATIVE mg/dL
Ketones, ur: NEGATIVE mg/dL
Ketones, ur: NEGATIVE mg/dL
Nitrite: NEGATIVE
Nitrite: NEGATIVE
Protein, ur: 30 mg/dL — AB
Protein, ur: NEGATIVE mg/dL
RBC / HPF: >50 RBC/hpf — ABNORMAL HIGH (ref 0–5)
Specific Gravity, Urine: 1.009 (ref 1.005–1.030)
Specific Gravity, Urine: 1.004 — ABNORMAL LOW (ref 1.005–1.030)
Squamous Epithelial / HPF: NONE SEEN (ref 0–5)
Squamous Epithelial / HPF: NONE SEEN (ref 0–5)
WBC, UA: >50 WBC/hpf — ABNORMAL HIGH (ref 0–5)
pH: 8 (ref 5.0–8.0)
pH: 8 (ref 5.0–8.0)

## 2020-01-13 LAB — CBC
HCT: 39.9 % (ref 36.0–46.0)
Hemoglobin: 12.5 g/dL (ref 12.0–15.0)
MCH: 27.4 pg (ref 26.0–34.0)
MCHC: 31.3 g/dL (ref 30.0–36.0)
MCV: 87.3 fL (ref 80.0–100.0)
Platelets: 397 10*3/uL (ref 150–400)
RBC: 4.57 MIL/uL (ref 3.87–5.11)
RDW: 16.1 % — ABNORMAL HIGH (ref 11.5–15.5)
WBC: 8.5 10*3/uL (ref 4.0–10.5)
nRBC: 0 % (ref 0.0–0.2)

## 2020-01-13 MED ORDER — CEPHALEXIN 500 MG PO CAPS: 500.0000 mg | ORAL_CAPSULE | Freq: Two times a day (BID) | ORAL | 0 refills | Status: AC

## 2020-01-13 MED ORDER — CEPHALEXIN 500 MG PO CAPS
500.0000 mg | ORAL_CAPSULE | Freq: Once | ORAL | Status: AC
  Administered 2020-01-13: 500 mg via ORAL
  Filled 2020-01-13: qty 1

## 2020-01-13 NOTE — ED Triage Notes (Signed)
Pt arrived via [facility] from home, has foley catheter with c/o burning sensation, constant x 1 week, here recently, no fever. Pt had catheter replaced in August.  134/64 98% RA p-85 CBG 201 99.0 oral temp

## 2020-01-13 NOTE — ED Notes (Signed)
Here because "my catheter irritates me".  Pt has catheter looped up and coming out of the top of her pants; educated pt that it needs to go down out the bottom of pants so can drain correctly. Denies NVD, abdominal pain. When asked who cares for her foley pt states "you guys". Denies have pcp or urology to take care of her foley. Pt lives at home.

## 2020-01-13 NOTE — ED Provider Notes (Signed)
[facility] Emergency Department Provider Note ____________________________________________   First MD Initiated Contact with Patient 01/13/20 1345     (approximate)  I have reviewed the triage vital signs and the nursing notes.   HISTORY  Chief Complaint Dysuria and Foley Catheter Pain  HPI Brittney Shepard is a 69 y.o. female with history of bladder cancer, congestive heart failure, and history as listed below presents to the emergency department for treatment and evaluation of dysuria.  Patient states that she significant dysuria with urination.  She has a Foley catheter that was replaced in August.  Patient denies known or subjective fever.      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    Secondary malignant neoplasm of liver ([place])    Stroke ([place]) 2013   Urinary tract infection, site not specified     Patient Active Problem List   Diagnosis Date Noted   Palliative care encounter    Hypokalemia 12/22/2019   Endometrial cancer ([place]) 12/20/2019   Goals of care, counseling/discussion 12/04/2019   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[place])    Weakness    Liver metastases ([place])    Essential hypertension    Type 2 diabetes mellitus without complication, without long-term current use of insulin ([place])    Morbid obesity with BMI of 45.0-49.9, adult ([place])    Atrial fibrillation ([place]) 11/19/2019   Hematuria, gross    Postmenopausal bleeding    Abnormal endometrial ultrasound    Atrial fibrillation with RVR ([place]) 11/18/2019   Bladder mass    Fall    Acute cystitis without hematuria    Chronic diastolic CHF (congestive heart failure)  ([place]) 05/27/2016   Primary osteoarthritis of both knees 05/27/2016   Progressive anemia 05/27/2016   DNR (do not resuscitate)    Palliative care by specialist    Acute respiratory failure with hypoxia and hypercapnia ([place])    Atrial fibrillation with rapid ventricular response ([place]) 03/20/2016   Chronic obstructive pulmonary disease with acute exacerbation ([employer])     Past Surgical History:  Procedure Laterality Date   CYSTOSCOPY W/ RETROGRADES N/A 11/20/2019   Procedure: CYSTOSCOPY WITH RETROGRADE PYELOGRAM;  Surgeon: Billey Co, MD;  Location: [facility] ORS;  Service: Urology;  Laterality: N/A;   CYSTOSCOPY WITH STENT PLACEMENT Left 11/20/2019   Procedure: CYSTOSCOPY WITH STENT PLACEMENT;  Surgeon: Billey Co, MD;  Location: [facility] ORS;  Service: Urology;  Laterality: Left;   TRANSURETHRAL RESECTION OF BLADDER TUMOR N/A 11/20/2019   Procedure: TRANSURETHRAL RESECTION OF BLADDER TUMOR (TURBT);  Surgeon: Billey Co, MD;  Location: [facility] ORS;  Service: Urology;  Laterality: N/A;    Prior to Admission medications   Medication Sig Start Date End Date Taking? Authorizing Provider  acetaminophen (TYLENOL) 325 MG tablet Take 2 tablets (650 mg total) by mouth every 6 (six) hours as needed for mild pain (or Fever >/= 101). 03/27/16   Bettey Costa, MD  apixaban (ELIQUIS) 2.5 MG TABS tablet Take 1 tablet (2.5 mg total) by mouth 2 (two) times daily. 12/27/19   Nicole Kindred A, DO  atenolol (TENORMIN) 25 MG tablet Take 1 tablet (25 mg total) by  mouth daily. 12/28/19   Ezekiel Slocumb, DO  cephALEXin (KEFLEX) 500 MG capsule Take 1 capsule (500 mg total) by mouth 2 (two) times daily for 7 days. 01/13/20 01/20/20  Tanee Henery, Dessa Phi, FNP  diltiazem (CARDIZEM SR) 60 MG 12 hr capsule Take 1 capsule (60 mg total) by mouth every 12 (twelve) hours. Patient taking differently: Take 60 mg by mouth 2 (two) times daily.  03/27/16   Bettey Costa, MD  furosemide (LASIX) 40 MG tablet Take 1 tablet (40 mg  total) by mouth 2 (two) times daily. 03/27/16   Bettey Costa, MD  ipratropium-albuterol (DUONEB) 0.5-2.5 (3) MG/3ML SOLN Take 3 mLs by nebulization 3 (three) times daily. 12/27/19   Ezekiel Slocumb, DO  metFORMIN (GLUCOPHAGE) 500 MG tablet Take 500 mg by mouth daily with breakfast.     [provider]  Multiple Vitamin (MULTIVITAMIN) tablet Take 1 tablet by mouth daily.    [provider]  polyethylene glycol (MIRALAX / GLYCOLAX) 17 g packet Take 17 g by mouth daily as needed for mild constipation. 12/27/19   Ezekiel Slocumb, DO  potassium chloride (KLOR-CON) 10 MEQ tablet Take 10 mEq by mouth 2 (two) times daily.    [provider]  senna-docusate (SENOKOT-S) 8.6-50 MG tablet Take 1 tablet by mouth 2 (two) times daily. 12/27/19   Ezekiel Slocumb, DO    Allergies Penicillin g and Sulfa antibiotics  Family History  Problem Relation Age of Onset   Ovarian cancer Mother    Cancer Sister     Social History Social History   Tobacco Use   Smoking status: Never Smoker   Smokeless tobacco: Never Used  Substance Use Topics   Alcohol use: No   Drug use: No    Review of Systems  Constitutional: No fever/chills Eyes: No visual changes. ENT: No sore throat. Cardiovascular: Denies chest pain. Respiratory: Denies shortness of breath. Gastrointestinal: No abdominal pain.  No nausea, no vomiting.  No diarrhea.  No constipation. Genitourinary: Positive for dysuria. Musculoskeletal: Negative for back pain. Skin: Negative for rash. Neurological: Negative for headaches, focal weakness or numbness. ____________________________________________   PHYSICAL EXAM:  VITAL SIGNS: ED Triage Vitals  Enc Vitals Group     BP 01/13/20 1212 101/60     Pulse Rate 01/13/20 1212 88     Resp 01/13/20 1212 20     Temp 01/13/20 1212 98.4 F (36.9 C)     Temp Source 01/13/20 1212 Oral     SpO2 01/13/20 1212 99 %     Weight 01/13/20 1215 287 lb (130.2 kg)     Height  01/13/20 1215 5\' 5"  (1.651 m)     Head Circumference --      Peak Flow --      Pain Score 01/13/20 1215 0     Pain Loc --      Pain Edu? --      Excl. in [place]? --     Constitutional: Alert.  No acute distress. Eyes: Conjunctivae are normal.  Head: Atraumatic. Nose: No congestion/rhinnorhea. Mouth/Throat: Mucous membranes are moist.  Oropharynx non-erythematous. Neck: No stridor.   Cardiovascular: Normal rate, regular rhythm. Grossly normal heart sounds.  Good peripheral circulation. Respiratory: Normal respiratory effort.  No retractions. Lungs CTAB. Gastrointestinal: Soft and nontender. No distention. No abdominal bruits.  Genitourinary: Foley catheter in place. Irritation noted around urethra. Musculoskeletal: No lower extremity tenderness nor edema.  No joint effusions. Neurologic:  Normal speech and language. No gross focal neurologic deficits  are appreciated. No gait instability. Skin:  Skin is warm, dry and intact. No rash noted. Psychiatric: Mood and affect are normal. Speech and behavior are normal.  ____________________________________________   LABS (all labs ordered are listed, but only abnormal results are displayed)  Labs Reviewed  URINALYSIS, COMPLETE (UACMP) WITH MICROSCOPIC - Abnormal; Notable for the following components:      Result Value   Color, Urine YELLOW (*)    APPearance CLOUDY (*)    Hgb urine dipstick MODERATE (*)    Protein, ur 30 (*)    Leukocytes,Ua LARGE (*)    RBC / HPF >50 (*)    WBC, UA >50 (*)    Bacteria, UA FEW (*)    All other components within normal limits  CBC - Abnormal; Notable for the following components:   RDW 16.1 (*)    All other components within normal limits  BASIC METABOLIC PANEL - Abnormal; Notable for the following components:   Glucose, Bld 190 (*)    Creatinine, Ser 1.34 (*)    Calcium 8.3 (*)    GFR calc non Af Amer 40 (*)    GFR calc Af Amer 46 (*)    All other components within normal limits  URINALYSIS,  COMPLETE (UACMP) WITH MICROSCOPIC - Abnormal; Notable for the following components:   Color, Urine COLORLESS (*)    APPearance CLEAR (*)    Specific Gravity, Urine 1.004 (*)    Hgb urine dipstick MODERATE (*)    Leukocytes,Ua SMALL (*)    Bacteria, UA RARE (*)    All other components within normal limits  URINE CULTURE   ____________________________________________  EKG  Not indicated. ____________________________________________  RADIOLOGY  ED MD interpretation:    N/a  I, Sherrie George, personally viewed and evaluated these images (plain radiographs) as part of my medical decision making, as well as reviewing the written report by the radiologist.  Official radiology report(s): No results found.  ____________________________________________   PROCEDURES  Procedure(s) performed (including Critical Care):  Procedures  ____________________________________________   INITIAL IMPRESSION / ASSESSMENT AND PLAN     69 year old female presents to the emergency department for dysuria.  While awaiting ER room assignment, labs were drawn and urinalysis was obtained from her Foley catheter.  Urine results certainly indicate infection, however unsure how reliable the specimen really is since this came from a Foley that has been in place for about a month.  Plan will be to replace her Foley catheter and send a second urinalysis to ensure accurate results.  No indication of sepsis.  She is not febrile, tachycardic, tachypneic, or hypotensive.  DIFFERENTIAL DIAGNOSIS  Acute cystitis, dysuria, bladder spasm  ED COURSE  Foley catheter replaced.  Urinalysis does show small amount leukocytes, 6-10 white blood cells, and rare bacteria with moderate hemoglobin and 11-20 red blood cells.  Nitrate negative.  Plan will be to treat her with Keflex for the next 5 days and have her follow-up with urology.  Remainder of her labs are at baseline with the exception of her creatinine which has  increased to 1.34 from 0.9 which is brought her GFR down to 40.  Patient is to call and schedule a follow-up appointment with her urologist or primary care provider.  Chart review shows that she also is to be evaluated by oncology and further discuss treatment of her metastatic cancer which includes bladder and liver and new metastasis to the endometrium.  She was strongly encouraged to keep this appointment as well.  For symptoms that change, worsen, or new concerns if she is unable to see primary care or her specialists, she is to return to the emergency department.    ___________________________________________   FINAL CLINICAL IMPRESSION(S) / ED DIAGNOSES  Final diagnoses:  Acute cystitis with hematuria  Foley catheter in place prior to arrival     ED Discharge Orders         Ordered    cephALEXin (KEFLEX) 500 MG capsule  2 times daily        01/13/20 Brittney Shepard was evaluated in Emergency Department on 01/13/2020 for the symptoms described in the history of present illness. She was evaluated in the context of the global COVID-19 pandemic, which necessitated consideration that the patient might be at risk for infection with the SARS-CoV-2 virus that causes COVID-19. Institutional protocols and algorithms that pertain to the evaluation of patients at risk for COVID-19 are in a state of rapid change based on information released by regulatory bodies including the CDC and federal and state organizations. These policies and algorithms were followed during the patient's care in the ED.   Note:  This document was prepared using Dragon voice recognition software and may include unintentional dictation errors.   Victorino Dike, FNP 01/13/20 1550    Arta Silence, MD 01/14/20 [phone]

## 2020-01-13 NOTE — ED Notes (Signed)
Urine pulled from foley catheter port for sampling. Culture also sent.

## 2020-01-14 LAB — URINE CULTURE

## 2020-01-15 ENCOUNTER — Encounter: Payer: Self-pay | Admitting: Oncology

## 2020-01-15 ENCOUNTER — Inpatient Hospital Stay (HOSPITAL_BASED_OUTPATIENT_CLINIC_OR_DEPARTMENT_OTHER): Payer: Medicare Other | Admitting: Hospice and Palliative Medicine

## 2020-01-15 ENCOUNTER — Inpatient Hospital Stay: Payer: Medicare Other | Attending: Oncology | Admitting: Oncology

## 2020-01-15 ENCOUNTER — Other Ambulatory Visit: Payer: Self-pay

## 2020-01-15 VITALS — BP 104/69 | HR 96 | Temp 97.6°F | Resp 16 | Wt 250.0 lb

## 2020-01-15 DIAGNOSIS — Z7189 Other specified counseling: Secondary | ICD-10-CM | POA: Diagnosis not present

## 2020-01-15 DIAGNOSIS — I4891 Unspecified atrial fibrillation: Secondary | ICD-10-CM | POA: Diagnosis not present

## 2020-01-15 DIAGNOSIS — Z7901 Long term (current) use of anticoagulants: Secondary | ICD-10-CM | POA: Diagnosis not present

## 2020-01-15 DIAGNOSIS — Z79899 Other long term (current) drug therapy: Secondary | ICD-10-CM | POA: Diagnosis not present

## 2020-01-15 DIAGNOSIS — C787 Secondary malignant neoplasm of liver and intrahepatic bile duct: Secondary | ICD-10-CM | POA: Diagnosis not present

## 2020-01-15 DIAGNOSIS — Z8673 Personal history of transient ischemic attack (TIA), and cerebral infarction without residual deficits: Secondary | ICD-10-CM | POA: Diagnosis not present

## 2020-01-15 DIAGNOSIS — Z8041 Family history of malignant neoplasm of ovary: Secondary | ICD-10-CM | POA: Insufficient documentation

## 2020-01-15 DIAGNOSIS — C679 Malignant neoplasm of bladder, unspecified: Secondary | ICD-10-CM | POA: Insufficient documentation

## 2020-01-15 DIAGNOSIS — I110 Hypertensive heart disease with heart failure: Secondary | ICD-10-CM | POA: Diagnosis not present

## 2020-01-15 DIAGNOSIS — Z7984 Long term (current) use of oral hypoglycemic drugs: Secondary | ICD-10-CM | POA: Diagnosis not present

## 2020-01-15 DIAGNOSIS — Z515 Encounter for palliative care: Secondary | ICD-10-CM

## 2020-01-15 DIAGNOSIS — E119 Type 2 diabetes mellitus without complications: Secondary | ICD-10-CM | POA: Insufficient documentation

## 2020-01-15 DIAGNOSIS — I509 Heart failure, unspecified: Secondary | ICD-10-CM | POA: Diagnosis not present

## 2020-01-15 NOTE — Progress Notes (Signed)
[facility]  [phone] [phone]   Name: Brittney Shepard Date: 01/15/2020 MRN: [id number]  DOB: 01-19-1951  Patient Care Team: Marguerita Merles, MD as PCP - General (Family Medicine) Kate Sable, MD as PCP - Cardiology (Cardiology) Clent Jacks, RN as Oncology Nurse Navigator    REASON FOR CONSULTATION: Brittney Shepard is a 69 y.o. female with multiple medical problems including history of CHF, COPD, A. fib not on anticoagulation, history of CVA, who was incidentally found to have a large bladder mass on imaging following a fall.  Patient subsequently underwent TURBT on 11/20/2019 with ureteral stent placement by Dr. Diamantina Providence.  Pathology revealed a high-grade urothelial carcinoma.  Patient also underwent biopsy of a liver mass on 11/22/2019 with findings consistent for poorly differentiated carcinoma.  Patient is not felt to be a surgical candidate for her stage IV cancer.  Palliative care was consulted help address goals.  SOCIAL HISTORY:     reports that she has never smoked. She has never used smokeless tobacco. She reports that she does not drink alcohol and does not use drugs.   Patient is widowed for the past several years.  She has a son who lives about 30 miles away.  Patient lives at home alone.  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    Secondary malignant neoplasm of liver ([place])    Stroke ([place]) 2013   Urinary tract infection,  site not specified     PAST SURGICAL HISTORY:  Past Surgical History:  Procedure Laterality Date   CYSTOSCOPY W/ RETROGRADES N/A 11/20/2019   Procedure: CYSTOSCOPY WITH RETROGRADE PYELOGRAM;  Surgeon: Billey Co, MD;  Location: [facility] ORS;  Service: Urology;  Laterality: N/A;   CYSTOSCOPY WITH STENT PLACEMENT Left 11/20/2019   Procedure: CYSTOSCOPY WITH STENT PLACEMENT;  Surgeon: Billey Co, MD;  Location: [facility] ORS;  Service: Urology;  Laterality: Left;   TRANSURETHRAL RESECTION OF BLADDER TUMOR N/A 11/20/2019   Procedure: TRANSURETHRAL RESECTION OF BLADDER TUMOR (TURBT);  Surgeon: Billey Co, MD;  Location: [facility] ORS;  Service: Urology;  Laterality: N/A;    HEMATOLOGY/ONCOLOGY HISTORY:  Oncology History Overview Note  # 2021- Liver bx-poorly differentiated CK 7+ carcinoma- DIAGNOSIS:  A. LIVER; ULTRASOUND-GUIDED CORE NEEDLE BIOPSY:  - POSITIVE FOR MALIGNANCY.  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   Comment:  Immunohistochemical studies show tumor cells to be positive for CK7 and  CK19, with focal faint expression of CD20. Tumor cells are negative for  CDX-2, GATA-3, TTF-1, and Pax-8. Additional stains were attempted, but  malignant tissue is exhausted in the core utilized for these studies.   The immunohistochemical pattern is relatively non-specific. The  patient's recent diagnosis of invasive urothelial carcinoma is noted,  but the histologic pattern does not appear similar, and lack of staining  for GATA3 would suggest against this. Concern for a gynecologic  malignancy is also noted, but lack of marking for Pax-8 would  suggest  against this. CK7 positivity may be seen in carcinomas arising in the  upper GI tract, lung, pancreatobiliary tree, and thymus, among other  sites. Lack of marking for TTF-1 would suggest against lung. The  presence of CK19 staining, while not specific, may be seen in carcinomas  arising from the pancreatobiliary tree, as well  as urinary bladder,  endometrium, lung, and kidney. Recent laboratory testing indicates liver  function testing within normal limits. Clinical and radiographic  correlation is recommended.   # JULY 2021- cystocopy- DIAGNOSIS: A. 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  - NO MUSCULARIS PROPRIA IDENTIFIED.   B. 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  - MUSCULARIS PROPRIA PRESENT AND UNINVOLVED.   # ENDOMETRIAL MASS [Dr.Staebler]- unable to Bx- sec to cervical stenosis  # Mediastinal mass  Lobulated anterior mediastinal 4.1 x 3.6 cm soft tissue mass, indeterminate for nodal metastasis versus primary thymic neoplasm. No additional potential findings of metastatic disease in the chest. 3. Mild cardiomegaly. Three-vessel coronary atherosclerosis. 4. Left adrenal myelolipoma. 5. Exophytic isodense 1.3 cm renal cortical lesion in the lateral upper right kidney  # CHF-compensated; morbid obesity; diabetes; COPD [not on O2]; A. Fib- not on anticoagulation.    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[place])   12/04/2019 -  Chemotherapy   The patient had dexamethasone (DECADRON) 4 MG tablet, 8 mg, Oral, Daily, 0 of 1 cycle, Start date: --, End date: -- palonosetron (ALOXI) injection 0.25 mg, 0.25 mg, Intravenous,  Once, 0 of 6 cycles pegfilgrastim (NEULASTA ONPRO KIT) injection 6 mg, 6 mg, Subcutaneous, Once, 0 of 6 cycles CARBOplatin (PARAPLATIN) in sodium chloride 0.9 % 100 mL chemo infusion, , Intravenous,  Once, 0 of 6 cycles fosaprepitant (EMEND) 150 mg in sodium chloride 0.9 % 145 mL IVPB, 150 mg, Intravenous,  Once, 0 of 6 cycles PACLitaxel (TAXOL) 558 mg in sodium chloride 0.9 % 500 mL chemo infusion (>  54m/m2), 225 mg/m2, Intravenous,  Once, 0 of 6 cycles  for chemotherapy treatment.    Liver metastases ([place]   Initial Diagnosis   [place] ([place]   12/04/2019 -  Chemotherapy   The patient had dexamethasone (DECADRON) 4 MG tablet, 8 mg, Oral, Daily, 0 of 1 cycle, Start date: --, End date: -- palonosetron (ALOXI) injection 0.25 mg, 0.25 mg, Intravenous,  Once, 0 of 6 cycles pegfilgrastim (NEULASTA ONPRO KIT) injection 6 mg, 6 mg, Subcutaneous, Once, 0 of 6 cycles CARBOplatin (PARAPLATIN) in sodium chloride 0.9 % 100 mL chemo infusion, , Intravenous,  Once, 0 of 6 cycles fosaprepitant (EMEND) 150 mg in sodium chloride 0.9 % 145 mL IVPB, 150 mg, Intravenous,  Once, 0 of 6 cycles PACLitaxel (TAXOL) 558 mg in sodium chloride 0.9 % 500 mL chemo infusion (> 865mm2), 225 mg/m2, Intravenous,  Once, 0 of 6 cycles  for chemotherapy treatment.      ALLERGIES:  is allergic to penicillin g and sulfa antibiotics.  MEDICATIONS:  Current Outpatient Medications  Medication Sig Dispense Refill   acetaminophen (TYLENOL) 325 MG tablet Take 2 tablets (650 mg total) by mouth every 6 (six) hours as needed for mild pain (or Fever >/= 101). 60 tablet 0   apixaban (ELIQUIS) 2.5 MG TABS tablet Take 1 tablet (2.5 mg total) by mouth 2 (two) times daily. (Patient not taking: Reported  on 01/15/2020) 60 tablet 1   atenolol (TENORMIN) 25 MG tablet Take 1 tablet (25 mg total) by mouth daily. 30 tablet 1   cephALEXin (KEFLEX) 500 MG capsule Take 1 capsule (500 mg total) by mouth 2 (two) times daily for 7 days. 14 capsule 0   diltiazem (CARDIZEM SR) 60 MG 12 hr capsule Take 1 capsule (60 mg total) by mouth every 12 (twelve) hours. (Patient taking differently: Take 60 mg by mouth 2 (two) times daily. ) 60 capsule 0   furosemide (LASIX) 40 MG tablet Take 1 tablet (40 mg total) by mouth 2 (two) times daily. 30 tablet 0   ipratropium-albuterol (DUONEB) 0.5-2.5 (3) MG/3ML SOLN Take 3 mLs by nebulization 3 (three)  times daily. (Patient not taking: Reported on 01/15/2020) 360 mL 0   metFORMIN (GLUCOPHAGE) 500 MG tablet Take 500 mg by mouth daily with breakfast.      Multiple Vitamin (MULTIVITAMIN) tablet Take 1 tablet by mouth daily. (Patient not taking: Reported on 01/15/2020)     polyethylene glycol (MIRALAX / GLYCOLAX) 17 g packet Take 17 g by mouth daily as needed for mild constipation. (Patient not taking: Reported on 01/15/2020) 14 each 0   potassium chloride (KLOR-CON) 10 MEQ tablet Take 10 mEq by mouth 2 (two) times daily.     senna-docusate (SENOKOT-S) 8.6-50 MG tablet Take 1 tablet by mouth 2 (two) times daily. (Patient not taking: Reported on 01/15/2020) 60 tablet 0   No current facility-administered medications for this visit.    VITAL SIGNS: There were no vitals taken for this visit. There were no vitals filed for this visit.  Estimated body mass index is 41.6 kg/m as calculated from the following:   Height as of 01/13/20: $RemoveBef'5\' 5"'[id number]$  (1.651 m).   Weight as of an earlier encounter on 01/15/20: 250 lb (113.4 kg).  LABS: CBC:    Component Value Date/Time   WBC 8.5 01/13/2020 1219   HGB 12.5 01/13/2020 1219   HCT 39.9 01/13/2020 1219   PLT 397 01/13/2020 1219   MCV 87.3 01/13/2020 1219   NEUTROABS 4.7 12/26/2019 0459   LYMPHSABS 1.2 12/26/2019 0459   MONOABS 0.6 12/26/2019 0459   EOSABS 0.5 12/26/2019 0459   BASOSABS 0.1 12/26/2019 0459   Comprehensive Metabolic Panel:    Component Value Date/Time   NA 136 01/13/2020 1219   K 4.3 01/13/2020 1219   CL 102 01/13/2020 1219   CO2 24 01/13/2020 1219   BUN 14 01/13/2020 1219   CREATININE 1.34 (H) 01/13/2020 1219   GLUCOSE 190 (H) 01/13/2020 1219   CALCIUM 8.3 (L) 01/13/2020 1219   AST 21 12/26/2019 0459   ALT 11 12/26/2019 0459   ALKPHOS 66 12/26/2019 0459   BILITOT 0.7 12/26/2019 0459   PROT 5.7 (L) 12/26/2019 0459   ALBUMIN 2.6 (L) 12/26/2019 0459    RADIOGRAPHIC STUDIES: DG Chest Port 1 View  Result Date:  12/22/2019 CLINICAL DATA:  69 year old female with possible sepsis. Atrial fibrillation with RVR. EXAM: PORTABLE CHEST 1 VIEW COMPARISON:  Chest CT 11/21/2019 and earlier. FINDINGS: Portable AP upright view at 1209 hours. Stable cardiomegaly and mediastinal contours. Normal lung volumes. Visualized tracheal air column is within normal limits. Chronic retrocardiac hypo ventilation appears stable. Elsewhere Allowing for portable technique the lungs are clear. No pneumothorax. No acute osseous abnormality identified. Paucity of bowel gas in the upper abdomen. IMPRESSION: Stable cardiomegaly. No acute cardiopulmonary abnormality. Electronically Signed   By: Genevie Ann M.D.   On: 12/22/2019 12:33  PERFORMANCE STATUS (ECOG) : 3 - Symptomatic, >50% confined to bed  Review of Systems Unless otherwise noted, a complete review of systems is negative.  Physical Exam General: NAD, morbidly obese Pulmonary: Unlabored Extremities: no edema, no joint deformities Skin: no rashes Neurological: Weakness but otherwise nonfocal  IMPRESSION: Patient was seen following her visit with Dr. Janese Banks.  She was accompanied by her son.  Patient is home from rehab but has been severely limited by her poor performance status.  Her son helps when able but he is a single parent and employed full-time.  Dr. Janese Banks feels that patient does not really have any viable treatment options at this point.  Had a conversation with patient and son regarding hospice involvement.  Patient is familiar with hospice from the care of her husband.  Patient verbalized agreement with hospice.  Patient's only symptomatic complaint today is regarding irritation from her Foley catheter.  She says that a voiding trial was conducted but she required replacement of the catheter.  I discussed that hospice would help manage and replace the Foley as needed.  It is possible that hospice could again try a voiding trial.  Patient has no scheduled follow-up with  urology.  Note the patient says she is taking an antihistamine at bedtime to help her sleep.  Would recommend using an alternative other than first-generation histamine due to reported difficulty with voiding.  I called and gave an order for hospice to [facility].   PLAN: -Best supportive care -Referral to hospice -DNR/DNI -RTC as needed  Patient expressed understanding and was in agreement with this plan. She also understands that She can call the clinic at any time with any questions, concerns, or complaints.     Time Total: 25 minutes  Visit consisted of counseling and education dealing with the complex and emotionally intense issues of symptom management and palliative care in the setting of serious and potentially life-threatening illness.Greater than 50%  of this time was spent counseling and coordinating care related to the above assessment and plan.  Signed by: Altha Harm, PhD, NP-C

## 2020-01-16 NOTE — Progress Notes (Signed)
Hematology/Oncology Consult note [facility]  [phone] [phone]  Patient Care Team: Marguerita Merles, MD as PCP - General (Family Medicine) Kate Sable, MD as PCP - Cardiology (Cardiology) Clent Jacks, RN as Oncology Nurse Navigator   Name of the patient: Brittney Shepard  [id number]  [birth year]   Date of visit: 01/15/70  Diagnosis- 1.  Superficial nonmuscle invasive bladder cancer 2.  Liver metastases: Carcinoma of unknown primary 3.  Endometrial mass-no biopsy   Chief complaint/ Reason for visit-discuss goals of care  Heme/Onc history:  Oncology History Overview Note  # 2021- Liver bx-poorly differentiated CK 7+ carcinoma- DIAGNOSIS:  A. LIVER; ULTRASOUND-GUIDED CORE NEEDLE BIOPSY:  - POSITIVE FOR MALIGNANCY.  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   Comment:  Immunohistochemical studies show tumor cells to be positive for CK7 and  CK19, with focal faint expression of CD20. Tumor cells are negative for  CDX-2, GATA-3, TTF-1, and Pax-8. Additional stains were attempted, but  malignant tissue is exhausted in the core utilized for these studies.   The immunohistochemical pattern is relatively non-specific. The  patient's recent diagnosis of invasive urothelial carcinoma is noted,  but the histologic pattern does not appear similar, and lack of staining  for GATA3 would suggest against this. Concern for a gynecologic  malignancy is also noted, but lack of marking for Pax-8 would suggest  against this. CK7 positivity may be seen in carcinomas arising in the  upper GI tract, lung, pancreatobiliary tree, and thymus, among other  sites. Lack of marking for TTF-1 would suggest against lung. The  presence of CK19 staining, while not specific, may be seen in carcinomas  arising from the pancreatobiliary tree, as well as urinary bladder,  endometrium, lung, and kidney. Recent laboratory testing indicates liver    function testing within normal limits. Clinical and radiographic  correlation is recommended.   # JULY 2021- cystocopy- DIAGNOSIS: A. 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  - NO MUSCULARIS PROPRIA IDENTIFIED.   B. 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  - MUSCULARIS PROPRIA PRESENT AND UNINVOLVED.   # ENDOMETRIAL MASS [Dr.Staebler]- unable to Bx- sec to cervical stenosis  # Mediastinal mass  Lobulated anterior mediastinal 4.1 x 3.6 cm soft tissue mass, indeterminate for nodal metastasis versus primary thymic neoplasm. No additional potential findings of metastatic disease in the chest. 3. Mild cardiomegaly. Three-vessel coronary atherosclerosis. 4. Left adrenal myelolipoma. 5. Exophytic isodense 1.3 cm renal cortical lesion in the lateral upper right kidney  # CHF-compensated; morbid obesity; diabetes; COPD [not on O2]; A. Fib- not on anticoagulation.    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[place])  11/22/2019 Initial Diagnosis   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[place])   12/04/2019 -  Chemotherapy   The patient had dexamethasone (DECADRON) 4 MG tablet, 8 mg, Oral, Daily, 0 of 1 cycle, Start date: --, End date: -- palonosetron (ALOXI) injection 0.25 mg, 0.25 mg, Intravenous,  Once, 0 of 6 cycles pegfilgrastim (NEULASTA ONPRO KIT) injection 6 mg, 6 mg, Subcutaneous, Once, 0 of 6 cycles CARBOplatin (PARAPLATIN) in sodium chloride 0.9 % 100 mL chemo infusion, , Intravenous,  Once, 0 of 6 cycles fosaprepitant (EMEND) 150 mg in sodium chloride 0.9 % 145 mL IVPB, 150 mg, Intravenous,  Once, 0 of 6 cycles PACLitaxel (TAXOL) 558 mg in sodium chloride 0.9 % 500 mL chemo infusion (>  46m/m2), 225 mg/m2, Intravenous,  Once, 0 of 6 cycles  for chemotherapy treatment.    Liver  metastases ([place]   Initial Diagnosis   Liver metastases ([facility]   12/03/69 -  Chemotherapy   The patient had dexamethasone (DECADRON) 4 MG tablet, 8 mg, Oral, Daily, 0 of 1 cycle, Start date: --, End date: -- palonosetron (ALOXI) injection 0.25 mg, 0.25 mg, Intravenous,  Once, 0 of 6 cycles pegfilgrastim (NEULASTA ONPRO KIT) injection 6 mg, 6 mg, Subcutaneous, Once, 0 of 6 cycles CARBOplatin (PARAPLATIN) in sodium chloride 0.9 % 100 mL chemo infusion, , Intravenous,  Once, 0 of 6 cycles fosaprepitant (EMEND) 150 mg in sodium chloride 0.9 % 145 mL IVPB, 150 mg, Intravenous,  Once, 0 of 6 cycles PACLitaxel (TAXOL) 558 mg in sodium chloride 0.9 % 500 mL chemo infusion (> 839mm2), 225 mg/m2, Intravenous,  Once, 0 of 6 cycles  for chemotherapy treatment.       Interval history-patient is living alone but is finding it difficult to take care of her needs.  She also feels constant discomfort from her Foley catheter.  Foley was attempted to be taken out in the past but patient could not void.  She spends most of the time in bed and only gets out to fix her meals. ECOG PS- 3 Pain scale- 0 Opioid associated constipation- no  Review of systems- Review of Systems  Constitutional: Positive for malaise/fatigue. Negative for chills, fever and weight loss.  HENT: Negative for congestion, ear discharge and nosebleeds.   Eyes: Negative for blurred vision.  Respiratory: Negative for cough, hemoptysis, sputum production, shortness of breath and wheezing.   Cardiovascular: Negative for chest pain, palpitations, orthopnea and claudication.  Gastrointestinal: Negative for abdominal pain, blood in stool, constipation, diarrhea, heartburn, melena, nausea and vomiting.  Genitourinary: Negative for dysuria, flank pain, frequency, hematuria and urgency.  Musculoskeletal: Negative for back pain, joint pain and myalgias.  Skin: Negative for rash.  Neurological: Negative for dizziness, tingling, focal weakness,  seizures, weakness and headaches.  Endo/Heme/Allergies: Does not bruise/bleed easily.  Psychiatric/Behavioral: Negative for depression and suicidal ideas. The patient does not have insomnia.       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    Secondary malignant neoplasm of liver ([facility]   Stroke ([facility]   Urinary tract infection, site not specified      Past Surgical History:  Procedure Laterality Date   CYSTOSCOPY W/ RETROGRADES N/A 11/20/2019   Procedure: CYSTOSCOPY WITH RETROGRADE PYELOGRAM;  Surgeon: SnBilley CoMD;  Location: [facility] ORS;  Service: Urology;  Laterality: N/A;   CYSTOSCOPY WITH STENT PLACEMENT Left 11/20/2019   Procedure: CYSTOSCOPY WITH STENT PLACEMENT;  Surgeon: SnBilley CoMD;  Location: [facility] ORS;  Service: Urology;  Laterality: Left;   TRANSURETHRAL RESECTION OF BLADDER TUMOR N/A 11/20/2019   Procedure: TRANSURETHRAL RESECTION OF BLADDER TUMOR (TURBT);  Surgeon: SnBilley CoMD;  Location: [facility] ORS;  Service: Urology;  Laterality: N/A;    Social History   Socioeconomic History   Marital status: Widowed  Spouse name: Not on file   Number of children: Not on file   Years of education: Not on file   Highest education level: Not on file  Occupational History   Not on file  Tobacco Use   Smoking status: Never Smoker   Smokeless tobacco: Never Used  Substance and Sexual Activity   Alcohol use: No   Drug use: No   Sexual activity: Not Currently  Other Topics Concern   Not on file  Social History Narrative   Retd. Nurse tech; retd in 2016  [? Stroke]; never smoked; no alcohol. Has one son.    Social Determinants of Health   Financial Resource Strain:    Difficulty of Paying Living Expenses: Not on file  Food Insecurity:    Worried About Charity fundraiser in the Last Year: Not on file   [employer] of Food in the Last Year: Not on file  Transportation Needs:    Lack of Transportation (Medical): Not on file   Lack of Transportation (Non-Medical): Not on file  Physical Activity:    Days of Exercise per Week: Not on file   Minutes of Exercise per Session: Not on file  Stress:    Feeling of Stress : Not on file  Social Connections:    Frequency of Communication with Friends and Family: Not on file   Frequency of Social Gatherings with Friends and Family: Not on file   Attends Religious Services: Not on file   Active Member of Clubs or Organizations: Not on file   Attends Archivist Meetings: Not on file   Marital Status: Not on file  Intimate Partner Violence:    Fear of Current or Ex-Partner: Not on file   Emotionally Abused: Not on file   Physically Abused: Not on file   Sexually Abused: Not on file    Family History  Problem Relation Age of Onset   Ovarian cancer Mother    Cancer Sister      Current Outpatient Medications:    atenolol (TENORMIN) 25 MG tablet, Take 1 tablet (25 mg total) by mouth daily., Disp: 30 tablet, Rfl: 1   cephALEXin (KEFLEX) 500 MG capsule, Take 1 capsule (500 mg total) by mouth 2 (two) times daily for 7 days., Disp: 14 capsule, Rfl: 0   diltiazem (CARDIZEM SR) 60 MG 12 hr capsule, Take 1 capsule (60 mg total) by mouth every 12 (twelve) hours. (Patient taking differently: Take 60 mg by mouth 2 (two) times daily. ), Disp: 60 capsule, Rfl: 0   furosemide (LASIX) 40 MG tablet, Take 1 tablet (40 mg total) by mouth 2 (two) times daily., Disp: 30 tablet, Rfl: 0   metFORMIN (GLUCOPHAGE) 500 MG tablet, Take 500 mg by mouth daily with breakfast. , Disp: , Rfl:     potassium chloride (KLOR-CON) 10 MEQ tablet, Take 10 mEq by mouth 2 (two) times daily., Disp: , Rfl:    acetaminophen (TYLENOL) 325 MG tablet, Take 2 tablets (650 mg total) by mouth every 6 (six) hours as needed for mild pain (or Fever >/= 101)., Disp: 60 tablet, Rfl: 0   apixaban (ELIQUIS) 2.5 MG TABS tablet, Take 1 tablet (2.5 mg total) by mouth 2 (two) times daily. (Patient not taking: Reported on 01/15/2020), Disp: 60 tablet, Rfl: 1   ipratropium-albuterol (DUONEB) 0.5-2.5 (3) MG/3ML SOLN, Take 3 mLs by nebulization 3 (three) times daily. (Patient not taking: Reported on 01/15/2020), Disp: 360 mL, Rfl: 0   Multiple Vitamin (MULTIVITAMIN) tablet,  Take 1 tablet by mouth daily. (Patient not taking: Reported on 01/15/2020), Disp: , Rfl:    polyethylene glycol (MIRALAX / GLYCOLAX) 17 g packet, Take 17 g by mouth daily as needed for mild constipation. (Patient not taking: Reported on 01/15/2020), Disp: 14 each, Rfl: 0   senna-docusate (SENOKOT-S) 8.6-50 MG tablet, Take 1 tablet by mouth 2 (two) times daily. (Patient not taking: Reported on 01/15/2020), Disp: 60 tablet, Rfl: 0  Physical exam:  Vitals:   01/15/20 1024  BP: 104/69  Pulse: 96  Resp: 16  Temp: 97.6 F (36.4 C)  TempSrc: Tympanic  SpO2: 99%  Weight: 250 lb (113.4 kg)   Physical Exam Constitutional:      Appearance: She is obese.     Comments: Sitting in a wheelchair.  Appears in no acute distress  Pulmonary:     Effort: Pulmonary effort is normal.  Abdominal:     General: Bowel sounds are normal.     Palpations: Abdomen is soft.     Comments: Foley catheter draining clear urine  Skin:    General: Skin is warm and dry.  Neurological:     Mental Status: She is alert and oriented to person, place, and time.      CMP Latest Ref Rng & Units 01/13/2020  Glucose 70 - 99 mg/dL 190(H)  BUN 8 - 23 mg/dL 14  Creatinine 0.44 - 1.00 mg/dL 1.34(H)  Sodium 135 - 145 mmol/L 136  Potassium 3.5 - 5.1 mmol/L 4.3  Chloride 98 - 111  mmol/L 102  CO2 22 - 32 mmol/L 24  Calcium 8.9 - 10.3 mg/dL 8.3(L)  Total Protein 6.5 - 8.1 g/dL -  Total Bilirubin 0.3 - 1.2 mg/dL -  Alkaline Phos 38 - 126 U/L -  AST 15 - 41 U/L -  ALT 0 - 44 U/L -   CBC Latest Ref Rng & Units 01/13/2020  WBC 4.0 - 10.5 K/uL 8.5  Hemoglobin 12.0 - 15.0 g/dL 12.5  Hematocrit 36 - 46 % 39.9  Platelets 150 - 400 K/uL 397    No images are attached to the encounter.  DG Chest Port 1 View  Result Date: 12/22/2019 CLINICAL DATA:  70 year old female with possible sepsis. Atrial fibrillation with RVR. EXAM: PORTABLE CHEST 1 VIEW COMPARISON:  Chest CT 11/21/2019 and earlier. FINDINGS: Portable AP upright view at 1209 hours. Stable cardiomegaly and mediastinal contours. Normal lung volumes. Visualized tracheal air column is within normal limits. Chronic retrocardiac hypo ventilation appears stable. Elsewhere Allowing for portable technique the lungs are clear. No pneumothorax. No acute osseous abnormality identified. Paucity of bowel gas in the upper abdomen. IMPRESSION: Stable cardiomegaly. No acute cardiopulmonary abnormality. Electronically Signed   By: Genevie Ann M.D.   On: 12/22/2019 12:33     Assessment and plan- Patient is a 69 y.o. female with h/o superficial bladder cancer, endometrial mass, liver metastases likely endometrial primary here to discuss further management  We again discussed that patient has limited social support.  Her son lives about 30 minutes away.  Patient lives alone and is finding it difficult to maintain her ADLs.  She reports discomfort at the site of Foley catheter.  She spends most of the time in bed and only gets out to fix her meals.  Discussed that if weekly carbotaxol chemotherapy is attempted-she will be making weekly trips to the cancer center for blood draws doctor visits and sit through chemo treatments.  Chemotherapy can be associated with side effect such as nausea vomiting  low blood counts risk of infections and  hospitalizations.  Chemotherapy would only be palliative and not curative.  Based on her NGS testing no other nonchemotherapy options are viable at this time.  Given her poor performance status we agreed that hospice would be a reasonable alternative at this time.  Patient and her son have experience with hospice as well.  Patient will be discussing this further with NP Altha Harm and transition to home hospice at that time.  She is a DNR/DNI.  Follow-up as needed   Total face to face encounter time for this patient visit was 30 min.     Visit Diagnosis 1. Goals of care, counseling/discussion      Dr. Randa Evens, MD, MPH [facility] at [facility] [phone] 01/16/2020 8:27 AM

## 2020-01-18 ENCOUNTER — Telehealth: Payer: Self-pay

## 2020-01-18 NOTE — Telephone Encounter (Signed)
Noted that Ms. Birge has decided on hospice care. Will not schedule gyn oncology follow up.

## 2020-02-09 ENCOUNTER — Ambulatory Visit: Payer: Medicare Other | Admitting: Cardiology

## 2020-02-12 ENCOUNTER — Encounter: Payer: Self-pay | Admitting: Cardiology

## 2020-02-14 ENCOUNTER — Emergency Department

## 2020-02-14 ENCOUNTER — Encounter: Payer: Self-pay | Admitting: Emergency Medicine

## 2020-02-14 ENCOUNTER — Inpatient Hospital Stay
Admission: EM | Admit: 2020-02-14 | Discharge: 2020-02-22 | DRG: 064 | Disposition: A | Discharge status: Skilled Nursing Facility | Attending: Internal Medicine | Admitting: Internal Medicine

## 2020-02-14 DIAGNOSIS — Z515 Encounter for palliative care: Secondary | ICD-10-CM

## 2020-02-14 DIAGNOSIS — Z79899 Other long term (current) drug therapy: Secondary | ICD-10-CM

## 2020-02-14 DIAGNOSIS — I959 Hypotension, unspecified: Secondary | ICD-10-CM | POA: Diagnosis not present

## 2020-02-14 DIAGNOSIS — C679 Malignant neoplasm of bladder, unspecified: Secondary | ICD-10-CM | POA: Diagnosis present

## 2020-02-14 DIAGNOSIS — N179 Acute kidney failure, unspecified: Secondary | ICD-10-CM | POA: Diagnosis present

## 2020-02-14 DIAGNOSIS — Z9221 Personal history of antineoplastic chemotherapy: Secondary | ICD-10-CM

## 2020-02-14 DIAGNOSIS — I10 Essential (primary) hypertension: Secondary | ICD-10-CM | POA: Diagnosis present

## 2020-02-14 DIAGNOSIS — R112 Nausea with vomiting, unspecified: Secondary | ICD-10-CM

## 2020-02-14 DIAGNOSIS — D6489 Other specified anemias: Secondary | ICD-10-CM | POA: Diagnosis present

## 2020-02-14 DIAGNOSIS — I110 Hypertensive heart disease with heart failure: Secondary | ICD-10-CM | POA: Diagnosis present

## 2020-02-14 DIAGNOSIS — D6859 Other primary thrombophilia: Secondary | ICD-10-CM | POA: Diagnosis present

## 2020-02-14 DIAGNOSIS — R42 Dizziness and giddiness: Secondary | ICD-10-CM

## 2020-02-14 DIAGNOSIS — I5032 Chronic diastolic (congestive) heart failure: Secondary | ICD-10-CM | POA: Diagnosis not present

## 2020-02-14 DIAGNOSIS — E119 Type 2 diabetes mellitus without complications: Secondary | ICD-10-CM

## 2020-02-14 DIAGNOSIS — Z6841 Body Mass Index (BMI) 40.0 and over, adult: Secondary | ICD-10-CM

## 2020-02-14 DIAGNOSIS — N390 Urinary tract infection, site not specified: Secondary | ICD-10-CM | POA: Diagnosis present

## 2020-02-14 DIAGNOSIS — I639 Cerebral infarction, unspecified: Secondary | ICD-10-CM

## 2020-02-14 DIAGNOSIS — C541 Malignant neoplasm of endometrium: Secondary | ICD-10-CM | POA: Diagnosis present

## 2020-02-14 DIAGNOSIS — Z20822 Contact with and (suspected) exposure to covid-19: Secondary | ICD-10-CM | POA: Diagnosis present

## 2020-02-14 DIAGNOSIS — Z8673 Personal history of transient ischemic attack (TIA), and cerebral infarction without residual deficits: Secondary | ICD-10-CM

## 2020-02-14 DIAGNOSIS — I63412 Cerebral infarction due to embolism of left middle cerebral artery: Principal | ICD-10-CM | POA: Diagnosis present

## 2020-02-14 DIAGNOSIS — R1110 Vomiting, unspecified: Secondary | ICD-10-CM

## 2020-02-14 DIAGNOSIS — I4891 Unspecified atrial fibrillation: Secondary | ICD-10-CM | POA: Diagnosis present

## 2020-02-14 DIAGNOSIS — J209 Acute bronchitis, unspecified: Secondary | ICD-10-CM | POA: Diagnosis present

## 2020-02-14 DIAGNOSIS — J440 Chronic obstructive pulmonary disease with acute lower respiratory infection: Secondary | ICD-10-CM | POA: Diagnosis present

## 2020-02-14 DIAGNOSIS — I5033 Acute on chronic diastolic (congestive) heart failure: Secondary | ICD-10-CM | POA: Diagnosis not present

## 2020-02-14 DIAGNOSIS — I4821 Permanent atrial fibrillation: Secondary | ICD-10-CM | POA: Diagnosis present

## 2020-02-14 DIAGNOSIS — R296 Repeated falls: Secondary | ICD-10-CM | POA: Diagnosis present

## 2020-02-14 DIAGNOSIS — Z880 Allergy status to penicillin: Secondary | ICD-10-CM

## 2020-02-14 DIAGNOSIS — R29700 NIHSS score 0: Secondary | ICD-10-CM | POA: Diagnosis present

## 2020-02-14 DIAGNOSIS — C799 Secondary malignant neoplasm of unspecified site: Secondary | ICD-10-CM

## 2020-02-14 DIAGNOSIS — Z66 Do not resuscitate: Secondary | ICD-10-CM | POA: Diagnosis present

## 2020-02-14 DIAGNOSIS — Z7984 Long term (current) use of oral hypoglycemic drugs: Secondary | ICD-10-CM

## 2020-02-14 DIAGNOSIS — Z23 Encounter for immunization: Secondary | ICD-10-CM

## 2020-02-14 DIAGNOSIS — E860 Dehydration: Secondary | ICD-10-CM | POA: Diagnosis present

## 2020-02-14 DIAGNOSIS — C787 Secondary malignant neoplasm of liver and intrahepatic bile duct: Secondary | ICD-10-CM | POA: Diagnosis present

## 2020-02-14 LAB — CBC
HCT: 39.6 % (ref 36.0–46.0)
Hemoglobin: 12.3 g/dL (ref 12.0–15.0)
MCH: 26.9 pg (ref 26.0–34.0)
MCHC: 31.1 g/dL (ref 30.0–36.0)
MCV: 86.5 fL (ref 80.0–100.0)
Platelets: 430 10*3/uL — ABNORMAL HIGH (ref 150–400)
RBC: 4.58 MIL/uL (ref 3.87–5.11)
RDW: 15.9 % — ABNORMAL HIGH (ref 11.5–15.5)
WBC: 16 10*3/uL — ABNORMAL HIGH (ref 4.0–10.5)
nRBC: 0 % (ref 0.0–0.2)

## 2020-02-14 LAB — BASIC METABOLIC PANEL
Anion gap: 13 (ref 5–15)
BUN: 21 mg/dL (ref 8–23)
CO2: 20 mmol/L — ABNORMAL LOW (ref 22–32)
Calcium: 8 mg/dL — ABNORMAL LOW (ref 8.9–10.3)
Chloride: 102 mmol/L (ref 98–111)
Creatinine, Ser: 1.54 mg/dL — ABNORMAL HIGH (ref 0.44–1.00)
GFR, Estimated: 34 mL/min — ABNORMAL LOW (ref >60)
Glucose, Bld: 209 mg/dL — ABNORMAL HIGH (ref 70–99)
Potassium: 4.7 mmol/L (ref 3.5–5.1)
Sodium: 135 mmol/L (ref 135–145)

## 2020-02-14 LAB — URINALYSIS, COMPLETE (UACMP) WITH MICROSCOPIC
Bilirubin Urine: NEGATIVE
Glucose, UA: NEGATIVE mg/dL
Ketones, ur: NEGATIVE mg/dL
Nitrite: POSITIVE — AB
Protein, ur: 100 mg/dL — AB
RBC / HPF: >50 RBC/hpf — ABNORMAL HIGH (ref 0–5)
Specific Gravity, Urine: 1.012 (ref 1.005–1.030)
Squamous Epithelial / HPF: NONE SEEN (ref 0–5)
WBC, UA: >50 WBC/hpf — ABNORMAL HIGH (ref 0–5)
pH: 7 (ref 5.0–8.0)

## 2020-02-14 LAB — TROPONIN I (HIGH SENSITIVITY): Troponin I (High Sensitivity): 6 ng/L (ref <18)

## 2020-02-14 LAB — RESPIRATORY PANEL BY RT PCR (FLU A&B, COVID)
Influenza A by PCR: NEGATIVE
Influenza B by PCR: NEGATIVE
SARS Coronavirus 2 by RT PCR: NEGATIVE

## 2020-02-14 MED ORDER — DILTIAZEM HCL 25 MG/5ML IV SOLN: 10.0000 mg | Freq: Once | INTRAVENOUS | Status: DC

## 2020-02-14 MED ORDER — ONDANSETRON HCL 4 MG/2ML IJ SOLN
4.0000 mg | Freq: Once | INTRAMUSCULAR | Status: AC
  Administered 2020-02-14: 4 mg via INTRAVENOUS
  Filled 2020-02-14: qty 2

## 2020-02-14 MED ORDER — FOSFOMYCIN TROMETHAMINE 3 G PO PACK
3.0000 g | PACK | Freq: Once | ORAL | Status: AC
  Administered 2020-02-14: 3 g via ORAL
  Filled 2020-02-14: qty 3

## 2020-02-14 MED ORDER — MECLIZINE HCL 25 MG PO TABS
25.0000 mg | ORAL_TABLET | Freq: Once | ORAL | Status: AC
  Administered 2020-02-14: 25 mg via ORAL
  Filled 2020-02-14: qty 1

## 2020-02-14 MED ORDER — INSULIN ASPART 100 UNIT/ML ~~LOC~~ SOLN
0.0000 [IU] | SUBCUTANEOUS | Status: DC
  Administered 2020-02-15 (×2): 1 [IU] via SUBCUTANEOUS
  Administered 2020-02-15 (×2): 2 [IU] via SUBCUTANEOUS
  Administered 2020-02-16: 1 [IU] via SUBCUTANEOUS
  Administered 2020-02-16: 2 [IU] via SUBCUTANEOUS
  Administered 2020-02-17 – 2020-02-18 (×3): 1 [IU] via SUBCUTANEOUS
  Administered 2020-02-18: 2 [IU] via SUBCUTANEOUS
  Administered 2020-02-18: 1 [IU] via SUBCUTANEOUS
  Administered 2020-02-18: 2 [IU] via SUBCUTANEOUS
  Administered 2020-02-19 – 2020-02-20 (×7): 1 [IU] via SUBCUTANEOUS
  Filled 2020-02-14 (×19): qty 1

## 2020-02-14 MED ORDER — SODIUM CHLORIDE 0.9 % IV SOLN
1.0000 g | Freq: Once | INTRAVENOUS | Status: AC
  Administered 2020-02-14: 1 g via INTRAVENOUS
  Filled 2020-02-14: qty 10

## 2020-02-14 NOTE — ED Notes (Signed)
Pt vomited previous liquid abx.

## 2020-02-14 NOTE — ED Triage Notes (Signed)
Pt arrived via EMS from home where she called out due to episodes of dizziness with movement x1 day. Pt had 1 episode of emesis on arrival to ED. Pt with hx/o Afib. Pt has foley catheter in place as well. Pt denies fever, chest pain or SOB.

## 2020-02-14 NOTE — ED Provider Notes (Signed)
[facility] Emergency Department Provider Note   ____________________________________________   First MD Initiated Contact with Patient 02/14/20 1938     (approximate)  I have reviewed the triage vital signs and the nursing notes.   HISTORY  Chief Complaint Dizziness and Emesis    HPI Brittney Shepard is a 69 y.o. female with possible history of hypertension, diabetes, CHF, atrial fibrillation, and metastatic bladder cancer who presents to the ED complaining of dizziness.  Patient reports that she was sitting in bed earlier tonight, about 1 hour prior to arrival, when she suddenly started feeling dizzy.  She describes it as a feeling that the room is spinning around her which becomes worse when she goes to move.  She denies any associated vision changes, numbness, or weakness.  She denies any history of similar symptoms or of vertigo.  She was not initially feeling nauseous, but started vomiting upon arrival to the ED. Patient is currently under hospice care for metastatic bladder cancer.        Past Medical History:  Diagnosis Date  . A-fib ([place])   . Bladder cancer ([place])   . CHF (congestive heart failure) ([place])   . Chronic pain   . Cognitive communication deficit   . Diabetes mellitus without complication ([place])   . Hypertension   . Hypokalemia   . Muscle weakness (generalized)   . 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 Secondary malignant neoplasm of liver ([place])   . Stroke [facility]) 2013  . Urinary tract infection, site not specified     Patient Active Problem List   Diagnosis Date Noted  . COPD with acute bronchitis ([place]) 02/14/2020  . Vertigo 02/14/2020  . Palliative care encounter   . Hypokalemia 12/22/2019  . Endometrial cancer ([place]) 12/20/2019  . Goals of care, counseling/discussion 12/04/2019  . 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 Weakness   . Liver metastases ([place])   . Essential hypertension   . Type 2 diabetes  mellitus without complication, without long-term current use of insulin ([place])   . Morbid obesity with BMI of 45.0-49.9, adult ([place])   . Atrial fibrillation ([place]) 11/19/2019  . Hematuria, gross   . Postmenopausal bleeding   . Abnormal endometrial ultrasound   . Atrial fibrillation with RVR ([place]) 11/18/2019  . Bladder mass   . Fall   . Acute cystitis without hematuria   . Chronic diastolic CHF (congestive heart failure) ([place]) 05/27/2016  . Primary osteoarthritis of both knees 05/27/2016  . Progressive anemia 05/27/2016  . DNR (do not resuscitate)   . Palliative care by specialist   . Acute respiratory failure with hypoxia and hypercapnia ([place])   . Atrial fibrillation with rapid ventricular response ([place]) 03/20/2016  . Chronic obstructive pulmonary disease with acute exacerbation [facility])     Past Surgical History:  Procedure Laterality Date  . CYSTOSCOPY W/ RETROGRADES N/A 11/20/2019   Procedure: CYSTOSCOPY WITH RETROGRADE PYELOGRAM;  Surgeon: Billey Co, MD;  Location: [facility] ORS;  Service: Urology;  Laterality: N/A;  . CYSTOSCOPY WITH STENT PLACEMENT Left 11/20/2019   Procedure: CYSTOSCOPY WITH STENT PLACEMENT;  Surgeon: Billey Co, MD;  Location: [facility] ORS;  Service: Urology;  Laterality: Left;  . TRANSURETHRAL RESECTION OF BLADDER TUMOR N/A 11/20/2019   Procedure: TRANSURETHRAL RESECTION OF BLADDER TUMOR (TURBT);  Surgeon: Billey Co, MD;  Location: [facility] ORS;  Service: Urology;  Laterality: N/A;    Prior to Admission medications   Medication Sig Start Date  End Date Taking? Authorizing Provider  acetaminophen (TYLENOL) 325 MG tablet Take 2 tablets (650 mg total) by mouth every 6 (six) hours as needed for mild pain (or Fever >/= 101). 03/27/16   Bettey Costa, MD  apixaban (ELIQUIS) 2.5 MG TABS tablet Take 1 tablet (2.5 mg total) by mouth 2 (two) times daily. Patient not taking: Reported on 01/15/2020 12/27/19   Nicole Kindred A, DO  atenolol (TENORMIN) 25 MG tablet Take 1  tablet (25 mg total) by mouth daily. 12/28/19   Ezekiel Slocumb, DO  diltiazem (CARDIZEM SR) 60 MG 12 hr capsule Take 1 capsule (60 mg total) by mouth every 12 (twelve) hours. Patient taking differently: Take 60 mg by mouth 2 (two) times daily.  03/27/16   Bettey Costa, MD  furosemide (LASIX) 40 MG tablet Take 1 tablet (40 mg total) by mouth 2 (two) times daily. 03/27/16   Bettey Costa, MD  ipratropium-albuterol (DUONEB) 0.5-2.5 (3) MG/3ML SOLN Take 3 mLs by nebulization 3 (three) times daily. Patient not taking: Reported on 01/15/2020 12/27/19   Nicole Kindred A, DO  metFORMIN (GLUCOPHAGE) 500 MG tablet Take 500 mg by mouth daily with breakfast.     [provider]  Multiple Vitamin (MULTIVITAMIN) tablet Take 1 tablet by mouth daily. Patient not taking: Reported on 01/15/2020    [provider]  polyethylene glycol (MIRALAX / GLYCOLAX) 17 g packet Take 17 g by mouth daily as needed for mild constipation. Patient not taking: Reported on 01/15/2020 12/27/19   Nicole Kindred A, DO  potassium chloride (KLOR-CON) 10 MEQ tablet Take 10 mEq by mouth 2 (two) times daily.    [provider]  senna-docusate (SENOKOT-S) 8.6-50 MG tablet Take 1 tablet by mouth 2 (two) times daily. Patient not taking: Reported on 01/15/2020 12/27/19   Nicole Kindred A, DO  VESICARE 10 MG tablet Take 10 mg by mouth daily. 01/04/20   [provider]    Allergies Penicillin g and Sulfa antibiotics  Family History  Problem Relation Age of Onset  . Ovarian cancer Mother   . Cancer Sister     Social History Social History   Tobacco Use  . Smoking status: Never Smoker  . Smokeless tobacco: Never Used  Substance Use Topics  . Alcohol use: No  . Drug use: No    Review of Systems  Constitutional: No fever/chills Eyes: No visual changes. ENT: No sore throat. Cardiovascular: Denies chest pain. Respiratory: Denies shortness of breath. Gastrointestinal: No abdominal pain.  Positive for  nausea and vomiting.  No diarrhea.  No constipation. Genitourinary: Negative for dysuria. Musculoskeletal: Negative for back pain. Skin: Negative for rash. Neurological: Negative for headaches, focal weakness or numbness.  Positive for dizziness.  ____________________________________________   PHYSICAL EXAM:  VITAL SIGNS: ED Triage Vitals  Enc Vitals Group     BP      Pulse      Resp      Temp      Temp src      SpO2      Weight      Height      Head Circumference      Peak Flow      Pain Score      Pain Loc      Pain Edu?      Excl. in [place]?     Constitutional: Alert and oriented. Eyes: Conjunctivae are normal.  Pupils equal round and reactive to light bilaterally. Head: Atraumatic. Nose: No congestion/rhinnorhea. Mouth/Throat: Mucous  membranes are moist. Neck: Normal ROM Cardiovascular: Tachycardic, irregularly irregular rhythm. Grossly normal heart sounds. Respiratory: Normal respiratory effort.  No retractions. Lungs CTAB. Gastrointestinal: Soft and nontender. No distention. Genitourinary: Foley catheter in place draining clear yellow urine. Musculoskeletal: No lower extremity tenderness nor edema. Neurologic:  Normal speech and language. No gross focal neurologic deficits are appreciated. Skin:  Skin is warm, dry and intact. No rash noted. Psychiatric: Mood and affect are normal. Speech and behavior are normal.  ____________________________________________   LABS (all labs ordered are listed, but only abnormal results are displayed)  Labs Reviewed  CBC - Abnormal; Notable for the following components:      Result Value   WBC 16.0 (*)    RDW 15.9 (*)    Platelets 430 (*)    All other components within normal limits  URINALYSIS, COMPLETE (UACMP) WITH MICROSCOPIC - Abnormal; Notable for the following components:   Color, Urine AMBER (*)    APPearance CLOUDY (*)    Hgb urine dipstick MODERATE (*)    Protein, ur 100 (*)    Nitrite POSITIVE (*)     Leukocytes,Ua LARGE (*)    RBC / HPF >50 (*)    WBC, UA >50 (*)    Bacteria, UA MANY (*)    All other components within normal limits  BASIC METABOLIC PANEL - Abnormal; Notable for the following components:   CO2 20 (*)    Glucose, Bld 209 (*)    Creatinine, Ser 1.54 (*)    Calcium 8.0 (*)    GFR, Estimated 34 (*)    All other components within normal limits  RESPIRATORY PANEL BY RT PCR (FLU A&B, COVID)  URINE CULTURE  TROPONIN I (HIGH SENSITIVITY)   ____________________________________________  EKG  ED ECG REPORT I, Blake Divine, the attending physician, personally viewed and interpreted this ECG.   Date: 02/14/2020  EKG Time: 19:48  Rate: 129  Rhythm: atrial fibrillation, rate 129  Axis: Normal  Intervals:none  ST&T Change: None   PROCEDURES  Procedure(s) performed (including Critical Care):  Procedures   ____________________________________________   INITIAL IMPRESSION / ASSESSMENT AND PLAN / ED COURSE       69 year old female with past medical history of hypertension, diabetes, atrial fibrillation on Eliquis, CHF, and metastatic bladder cancer who presents to the ED for acute onset dizziness earlier this evening.  She states it seems to be worse with changes in position and it sounds most consistent with peripheral vertigo, however given her history we will screen CT head.  EKG shows atrial fibrillation with RVR, however her rate rapidly improved after she was given Zofran for nausea.  She denies any abdominal pain and her nausea and vomiting is likely secondary to dizziness.  Labs are reassuring, but UA is concerning for UTI, especially given her leukocytosis.  She was initially treated with fosfomycin, but vomited this up.  We will treat with Rocephin and urine was sent for culture.  CT head is negative for acute process, patient has had minimal improvement in symptoms following meclizine.  She continues to feel quite dizzy and is quite debilitated because of  this.  She would have difficulty caring for herself at home given she lives alone.  Because of this, case discussed with hospitalist for admission for further work-up of dizziness and UTI.      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   Blake Divine, MD 02/14/20 2325

## 2020-02-14 NOTE — H&P (Signed)
Brittney Shepard ZDG:[id number] DOB: Dec 21, 1950 DOA: 02/14/2020     PCP: Marguerita Merles, MD   Outpatient Specialists:   Hardie Pulley, MD    Oncology   Dr. Janese Banks   Patient arrived to ER on 02/14/20 at 1936 Referred by Attending Blake Divine, MD   Patient coming from: home Lives alone, with home health   Chief Complaint:   Chief Complaint  Patient presents with  . Dizziness  . Emesis    HPI: Brittney Shepard is a 69 y.o. female with medical history significant of Bladder cancer s/p transurethral resection of bladder tumor with secondary malignant neoplasm of liver, stroke, congestive heart failure, atrial fibrillation not on anticoagultion, diabetes, hypertension, chronic pain.Acquired thrombophilia  endometrial mass, morbid obesity frequent falls, COPD, indwelling catheter, mediastinal mass    Presented with   feeling dizziness with movement for past 1 day and had an episode of vomiting.  No fevers no chest pain no shortness of breath Describes vertigo-like sensation which is worse when she is trying to move. No all other neurological changes no numbness or weakness Recently admitted in august 2021 for a.fib w RVR. discharged home with Home Health   Infectious risk factors:  Reports  fatigue      Has  NOt been vaccinated against COVID (wants to get better first)   Initial COVID TEST  NEGATIVE   Lab Results  Component Value Date   [place] 02/14/2020   [place] NEGATIVE 12/22/2019   [place] NEGATIVE 11/22/2019   [place] NEGATIVE 11/18/2019     Regarding pertinent Chronic problems:    HTN on atenolol, diltiazem    chronic CHF diastolic - last echo July 2021 There is severe left  ventricular hypertrophy. Left  ventricular diastolic function could not be evaluated.    DM 2 -  Lab Results  Component Value Date   HGBA1C 6.2 (H) 11/18/2019   on  PO meds only,     Morbid obesity-   BMI Readings from Last 1 Encounters:   01/15/20 41.60 kg/m       COPD - not  not  on baseline oxygen     A. Fib -  - CHA2DS2 vas score  5       Not on anticoagulation secondary to Risk of Falls         -  Rate control:  Currently controlled with atenolol Diltiazem,     While in ER:  initially had A.fib w RVR improved with   Control of nausea Noted to have evidence of UTI Urine culture sent At first attempted to give PO meds pt did not tolerate due to nausea and vomiting   Hospitalist was called for admission for AKI and dehydration  The following Work up has been ordered so far:  Orders Placed This Encounter  Procedures  . Urine culture  . Respiratory Panel by RT PCR (Flu A&B, Covid) - Nasopharyngeal Swab  . CT Head Wo Contrast  . CBC  . Urinalysis, Complete w Microscopic  . Basic metabolic panel  . Diet NPO time specified  . Consult to hospitalist  ALL PATIENTS BEING ADMITTED/HAVING PROCEDURES NEED COVID-19 SCREENING  . ED EKG  . EKG 12-Lead  . EKG 12-Lead   Following Medications were ordered in ER: Medications  meclizine (ANTIVERT) tablet 25 mg (25 mg Oral Given 02/14/20 2015)  ondansetron (ZOFRAN) injection 4 mg (4 mg Intravenous Given 02/14/20 2000)  fosfomycin (MONUROL) packet 3 g (3 g Oral Given 02/14/20 2049)  cefTRIAXone (ROCEPHIN) 1 g in sodium chloride 0.9 % 100 mL IVPB (0 g Intravenous Stopped 02/14/20 2241)        Consult Orders  (From admission, onward)         Start     Ordered   02/14/20 2233  Consult to hospitalist  ALL PATIENTS BEING ADMITTED/HAVING PROCEDURES NEED COVID-19 SCREENING  Once       Comments: ALL PATIENTS BEING ADMITTED/HAVING PROCEDURES NEED COVID-19 SCREENING  Provider:  (Not yet assigned)  Question Answer Comment  Place call to: [phone]   Reason for Consult Admit      02/14/20 2232           Significant initial  Findings: Abnormal Labs Reviewed  CBC - Abnormal; Notable for the following components:      Result Value   WBC 16.0 (*)    RDW 15.9 (*)     Platelets 430 (*)    All other components within normal limits  URINALYSIS, COMPLETE (UACMP) WITH MICROSCOPIC - Abnormal; Notable for the following components:   Color, Urine AMBER (*)    APPearance CLOUDY (*)    Hgb urine dipstick MODERATE (*)    Protein, ur 100 (*)    Nitrite POSITIVE (*)    Leukocytes,Ua LARGE (*)    RBC / HPF >50 (*)    WBC, UA >50 (*)    Bacteria, UA MANY (*)    All other components within normal limits  BASIC METABOLIC PANEL - Abnormal; Notable for the following components:   CO2 20 (*)    Glucose, Bld 209 (*)    Creatinine, Ser 1.54 (*)    Calcium 8.0 (*)    GFR, Estimated 34 (*)    All other components within normal limits    Otherwise labs showing:  Recent Labs  Lab 02/14/20 1949  NA 135  K 4.7  CO2 20*  GLUCOSE 209*  BUN 21  CREATININE 1.54*  CALCIUM 8.0*    Cr     Up from baseline see below Lab Results  Component Value Date   CREATININE 1.54 (H) 02/14/2020   CREATININE 1.34 (H) 01/13/2020   CREATININE 0.96 12/29/2019    No results for input(s): AST, ALT, ALKPHOS, BILITOT, PROT, ALBUMIN in the last 168 hours. Lab Results  Component Value Date   CALCIUM 8.0 (L) 02/14/2020   PHOS 2.8 12/26/2019     WBC      Component Value Date/Time   WBC 16.0 (H) 02/14/2020 1949   LYMPHSABS 1.2 12/26/2019 0459   MONOABS 0.6 12/26/2019 0459   EOSABS 0.5 12/26/2019 0459   BASOSABS 0.1 12/26/2019 0459    Plt: Lab Results  Component Value Date   PLT 430 (H) 02/14/2020     COVID-19 Labs  No results for input(s): DDIMER, FERRITIN, LDH, CRP in the last 72 hours.  Lab Results  Component Value Date   SARSCOV2NAA NEGATIVE 02/14/2020   SARSCOV2NAA NEGATIVE 12/22/2019   SARSCOV2NAA NEGATIVE 11/22/2019   [place] NEGATIVE 11/18/2019    HG/HCT   Stable     Component Value Date/Time   HGB 12.3 02/14/2020 1949   HCT 39.6 02/14/2020 1949   MCV 86.5 02/14/2020 1949    No results for input(s): LIPASE, AMYLASE in the last 168 hours. No  results for input(s): AMMONIA in the last 168 hours.     ECG: Ordered Personally reviewed by me showing: HR : 129  Rhythm:  A.fib. W RVR   no evidence of ischemic changes QTC 503  BNP (last 3 results) Recent Labs    11/18/19 0919  BNP 246.5*     DM  labs:  HbA1C: Recent Labs    11/18/19 2106  HGBA1C 6.2*        UA   evidence of UTI    Urine analysis:    Component Value Date/Time   COLORURINE AMBER (A) 02/14/2020 1949   APPEARANCEUR CLOUDY (A) 02/14/2020 1949   LABSPEC 1.012 02/14/2020 1949   PHURINE 7.0 02/14/2020 1949   GLUCOSEU NEGATIVE 02/14/2020 1949   HGBUR MODERATE (A) 02/14/2020 1949   BILIRUBINUR NEGATIVE 02/14/2020 1949   KETONESUR NEGATIVE 02/14/2020 1949   PROTEINUR 100 (A) 02/14/2020 1949   NITRITE POSITIVE (A) 02/14/2020 1949   LEUKOCYTESUR LARGE (A) 02/14/2020 1949      Ordered  CT HEAD   NON acute   ED Triage Vitals [02/14/20 1951]  Enc Vitals Group     BP 132/81     Pulse Rate (!) 119     Resp 20     Temp 98.6 F (37 C)     Temp Source Oral     SpO2 96 %     Weight      Height      Head Circumference      Peak Flow      Pain Score      Pain Loc      Pain Edu?      Excl. in [place]?   LXBW(62)@       Latest  Blood pressure 117/78, pulse 82, temperature 98.6 F (37 C), temperature source Oral, resp. rate (!) 23, SpO2 99 %.   Review of Systems:    Pertinent positives include: vertigo, fatigue  Constitutional:  No weight loss, night sweats, Fevers, chills, fatigue, weight loss  HEENT:  No headaches, Difficulty swallowing,Tooth/dental problems,Sore throat,  No sneezing, itching, ear ache, nasal congestion, post nasal drip,  Cardio-vascular:  No chest pain, Orthopnea, PND, anasarca, dizziness, palpitations.no Bilateral lower extremity swelling  GI:  No heartburn, indigestion, abdominal pain, nausea, vomiting, diarrhea, change in bowel habits, loss of appetite, melena, blood in stool, hematemesis Resp:  no shortness of breath at  rest. No dyspnea on exertion, No excess mucus, no productive cough, No non-productive cough, No coughing up of blood.No change in color of mucus.No wheezing. Skin:  no rash or lesions. No jaundice GU:  no dysuria, change in color of urine, no urgency or frequency. No straining to urinate.  No flank pain.  Musculoskeletal:  No joint pain or no joint swelling. No decreased range of motion. No back pain.  Psych:  No change in mood or affect. No depression or anxiety. No memory loss.  Neuro: no localizing neurological complaints, no tingling, no weakness, no double vision, no gait abnormality, no slurred speech, no confusion  All systems reviewed and apart from [place] all are negative  Past Medical History:   Past Medical History:  Diagnosis Date  . A-fib ([place])   . Bladder cancer ([place])   . CHF (congestive heart failure) ([place])   . Chronic pain   . Cognitive communication deficit   . Diabetes mellitus without complication ([place])   . Hypertension   . Hypokalemia   . Muscle weakness (generalized)   . 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 Secondary malignant neoplasm of liver ([place])   . Stroke [facility]) 2013  . Urinary tract infection, site not specified      Past Surgical History:  Procedure Laterality Date  .  CYSTOSCOPY W/ RETROGRADES N/A 11/20/2019   Procedure: CYSTOSCOPY WITH RETROGRADE PYELOGRAM;  Surgeon: Billey Co, MD;  Location: [facility] ORS;  Service: Urology;  Laterality: N/A;  . CYSTOSCOPY WITH STENT PLACEMENT Left 11/20/2019   Procedure: CYSTOSCOPY WITH STENT PLACEMENT;  Surgeon: Billey Co, MD;  Location: [facility] ORS;  Service: Urology;  Laterality: Left;  . TRANSURETHRAL RESECTION OF BLADDER TUMOR N/A 11/20/2019   Procedure: TRANSURETHRAL RESECTION OF BLADDER TUMOR (TURBT);  Surgeon: Billey Co, MD;  Location: [facility] ORS;  Service: Urology;  Laterality: N/A;    Social History:  Ambulatory  walker      reports that she has never smoked. She has never used  smokeless tobacco. She reports that she does not drink alcohol and does not use drugs.   Family History:   Family History  Problem Relation Age of Onset  . Ovarian cancer Mother   . Cancer Sister     Allergies: Allergies  Allergen Reactions  . Penicillin G Hives  . Sulfa Antibiotics Rash     Prior to Admission medications   Medication Sig Start Date End Date Taking? Authorizing Provider  acetaminophen (TYLENOL) 325 MG tablet Take 2 tablets (650 mg total) by mouth every 6 (six) hours as needed for mild pain (or Fever >/= 101). 03/27/16   Bettey Costa, MD  apixaban (ELIQUIS) 2.5 MG TABS tablet Take 1 tablet (2.5 mg total) by mouth 2 (two) times daily. Patient not taking: Reported on 01/15/2020 12/27/19   Nicole Kindred A, DO  atenolol (TENORMIN) 25 MG tablet Take 1 tablet (25 mg total) by mouth daily. 12/28/19   Ezekiel Slocumb, DO  diltiazem (CARDIZEM SR) 60 MG 12 hr capsule Take 1 capsule (60 mg total) by mouth every 12 (twelve) hours. Patient taking differently: Take 60 mg by mouth 2 (two) times daily.  03/27/16   Bettey Costa, MD  furosemide (LASIX) 40 MG tablet Take 1 tablet (40 mg total) by mouth 2 (two) times daily. 03/27/16   Bettey Costa, MD  ipratropium-albuterol (DUONEB) 0.5-2.5 (3) MG/3ML SOLN Take 3 mLs by nebulization 3 (three) times daily. Patient not taking: Reported on 01/15/2020 12/27/19   Nicole Kindred A, DO  metFORMIN (GLUCOPHAGE) 500 MG tablet Take 500 mg by mouth daily with breakfast.     [provider]  Multiple Vitamin (MULTIVITAMIN) tablet Take 1 tablet by mouth daily. Patient not taking: Reported on 01/15/2020    [provider]  polyethylene glycol (MIRALAX / GLYCOLAX) 17 g packet Take 17 g by mouth daily as needed for mild constipation. Patient not taking: Reported on 01/15/2020 12/27/19   Nicole Kindred A, DO  potassium chloride (KLOR-CON) 10 MEQ tablet Take 10 mEq by mouth 2 (two) times daily.    [provider]  senna-docusate  (SENOKOT-S) 8.6-50 MG tablet Take 1 tablet by mouth 2 (two) times daily. Patient not taking: Reported on 01/15/2020 12/27/19   Nicole Kindred A, DO  VESICARE 10 MG tablet Take 10 mg by mouth daily. 01/04/20   [provider]   Physical Exam: Vitals with BMI 02/14/2020 02/14/2020 02/14/2020  Height - - -  Weight - - -  BMI - - -  Systolic 378 588 502  Diastolic 78 72 58  Pulse 82 62 86     1. General:  in No  Acute distress   Chronically ill -appearing 2. Psychological: Alert and  Oriented 3. Head/ENT:  Dry Mucous Membranes  Head Non traumatic, neck supple                          Poor Dentition 4. SKIN:  decreased Skin turgor,  Skin clean Dry and intact no rash 5. Heart: Regular rate and rhythm no Murmur, no Rub or gallop 6. Lungs:  no wheezes or crackles   7. Abdomen: Soft,  non-tender, Non distended bowel sounds present 8. Lower extremities: no clubbing, cyanosis, no  edema 9. Neurologically Grossly intact, moving all 4 extremities equally   10. MSK: Normal range of motion   All other LABS:     Recent Labs  Lab 02/14/20 1949  WBC 16.0*  HGB 12.3  HCT 39.6  MCV 86.5  PLT 430*     Recent Labs  Lab 02/14/20 1949  NA 135  K 4.7  CL 102  CO2 20*  GLUCOSE 209*  BUN 21  CREATININE 1.54*  CALCIUM 8.0*     No results for input(s): AST, ALT, ALKPHOS, BILITOT, PROT, ALBUMIN in the last 168 hours.     Cultures:    Component Value Date/Time   SDES  01/13/2020 1219    URINE, RANDOM Performed at [facility], [address] [place], [place] [address]    [facility]  01/13/2020 1219    NONE Performed at [facility], [address] [place], [place] [address]    CULT MULTIPLE SPECIES PRESENT, SUGGEST RECOLLECTION (A) 01/13/2020 1219   REPTSTATUS 01/14/2020 FINAL 01/13/2020 1219     Radiological Exams on Admission: CT Head Wo Contrast  Result Date: 02/14/2020 CLINICAL DATA:  Vertigo EXAM: CT HEAD WITHOUT  CONTRAST TECHNIQUE: Contiguous axial images were obtained from the base of the skull through the vertex without intravenous contrast. COMPARISON:  03/31/2009 FINDINGS: Brain: Ends level malacia secondary to remote left frontal infarct is present. Remote lacunar infarct noted within the left basal ganglia. Mild parenchymal volume loss is present, commensurate with the patient's age. Mild periventricular white matter changes are present likely reflecting the sequela of small vessel ischemia. No evidence of acute intracranial hemorrhage or infarct. No abnormal mass effect or midline shift. No abnormal intra or extra-axial mass lesion or fluid collection. The ventricular size is normal. Cerebellum is unremarkable. Vascular: No asymmetric hyperdense vasculature at the skull base. Skull: Intact Sinuses/Orbits: The visualized paranasal sinuses are clear. The visualized orbits are unremarkable. Other: Visualized mastoid air cells and middle ear cavities are clear. IMPRESSION: Remote infarcts. Senescent changes. No evidence of acute intracranial hemorrhage or infarct. Electronically Signed   By: Fidela Salisbury MD   On: 02/14/2020 22:11    Chart has been reviewed   Assessment/Plan  69 y.o. female with medical history significant of Bladder cancer s/p transurethral resection of bladder tumor with secondary malignant neoplasm of liver, stroke, congestive heart failure, atrial fibrillation not on anticoagultion, diabetes, hypertension, chronic pain.Acquired thrombophilia  endometrial mass, morbid obesity frequent falls, COPD, indwelling catheter, mediastinal mass    Admitted for AKI, dehydration  Present on Admission: . 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 Essential hypertension -continue  If blood pressure Allows change Atenolol  to Metoprolol given AKi  . Endometrial cancer ([place]) -possibly metastatic spread  . Chronic diastolic CHF (congestive heart failure) (HCC) - - currently appears to be slightly on the dry side, hold home diuretics for  tonight and restart when appears euvolemic, carefuly follow fluid status and Cr   . Atrial fibrillation with RVR (H[facility]) - HR improved resume diltiazem, would change atenolol to metoprolol given AKI   . COPD with acute bronchitis ([place]) - stable continue home meds  . Dehydration - will gently rehydrate.  Marland Kitchen AKI (acute kidney injury) ([place]) - - likely secondary to dehydration,       check FeNA       Rehydrate with IV fluids     Avoid nephrotoxic medications  has indwelling catheter would obtain renal US to evaluate for other causes   UTi - pt with indwelling catheter, unsure if true infection vs colonization, given IV rocephin in Er await results of urine culture  pt reports dysuria will continue rocephin and await results of culture   Vertigo - obtain MRI brain given hx of wide mets CA  Other plan as per orders.  DVT prophylaxis:  SCD    Code Status:    Code Status: Prior   DNR/DNI as per patient  I had personally discussed CODE STATUS with patient    Family Communication:   Family not at  Bedside   Disposition Plan:  likely will need placement for rehabilitation                          Following barriers for discharge:                           able to transition to PO antibiotics                             Will need to be able to tolerate PO                     EXPECT DC tomorrow  Would benefit from PT/OT eval prior to DC  Ordered                                     Palliative care    consulted                  Consults called:  notified oncology pt has been admitted through smart chat  Admission status:  ED Disposition    ED Disposition Condition [place]: [place] [[id number]]  Level of Care: Med-Surg [16]   Covid Evaluation: Confirmed COVID Negative  Diagnosis: Dehydration [276.51.ICD-9-CM]  Admitting Physician: Toy Baker [[id number]]  Attending Physician: Toy Baker [[id number]]       Obs    Level of care     tele  For  24H    Lab Results  Component Value Date   [place] NEGATIVE 02/14/2020     Precautions: admitted as  Covid Negative   PPE: Used by the provider:   P100  eye Goggles,  Gloves       Larin Depaoli 02/15/2020, 12:50 AM    [facility]     after 2 AM please page floor coverage PA If 7AM-7PM, please contact the day team taking care of the patient using [email or web address]   Patient was evaluated in the context of the global COVID-19 pandemic, which necessitated consideration that the patient might be at risk for infection with the SARS-CoV-2 virus that  causes COVID-19. Institutional protocols and algorithms that pertain to the evaluation of patients at risk for COVID-19 are in a state of rapid change based on information released by regulatory bodies including the CDC and federal and state organizations. These policies and algorithms were followed during the patient's care.

## 2020-02-15 ENCOUNTER — Observation Stay

## 2020-02-15 ENCOUNTER — Other Ambulatory Visit: Payer: Self-pay

## 2020-02-15 DIAGNOSIS — C541 Malignant neoplasm of endometrium: Secondary | ICD-10-CM | POA: Diagnosis not present

## 2020-02-15 DIAGNOSIS — Z7189 Other specified counseling: Secondary | ICD-10-CM

## 2020-02-15 DIAGNOSIS — I639 Cerebral infarction, unspecified: Secondary | ICD-10-CM

## 2020-02-15 DIAGNOSIS — C787 Secondary malignant neoplasm of liver and intrahepatic bile duct: Secondary | ICD-10-CM

## 2020-02-15 DIAGNOSIS — E860 Dehydration: Secondary | ICD-10-CM

## 2020-02-15 DIAGNOSIS — C799 Secondary malignant neoplasm of unspecified site: Secondary | ICD-10-CM

## 2020-02-15 DIAGNOSIS — C679 Malignant neoplasm of bladder, unspecified: Secondary | ICD-10-CM | POA: Diagnosis not present

## 2020-02-15 DIAGNOSIS — R42 Dizziness and giddiness: Secondary | ICD-10-CM | POA: Diagnosis not present

## 2020-02-15 DIAGNOSIS — Z515 Encounter for palliative care: Secondary | ICD-10-CM | POA: Diagnosis not present

## 2020-02-15 DIAGNOSIS — N179 Acute kidney failure, unspecified: Secondary | ICD-10-CM

## 2020-02-15 DIAGNOSIS — I5032 Chronic diastolic (congestive) heart failure: Secondary | ICD-10-CM | POA: Diagnosis not present

## 2020-02-15 DIAGNOSIS — J440 Chronic obstructive pulmonary disease with acute lower respiratory infection: Secondary | ICD-10-CM | POA: Diagnosis not present

## 2020-02-15 LAB — GLUCOSE, CAPILLARY
Glucose-Capillary: 116 mg/dL — ABNORMAL HIGH (ref 70–99)
Glucose-Capillary: 120 mg/dL — ABNORMAL HIGH (ref 70–99)
Glucose-Capillary: 125 mg/dL — ABNORMAL HIGH (ref 70–99)
Glucose-Capillary: 134 mg/dL — ABNORMAL HIGH (ref 70–99)
Glucose-Capillary: 161 mg/dL — ABNORMAL HIGH (ref 70–99)
Glucose-Capillary: 184 mg/dL — ABNORMAL HIGH (ref 70–99)

## 2020-02-15 LAB — COMPREHENSIVE METABOLIC PANEL
ALT: 7 U/L (ref 0–44)
AST: 13 U/L — ABNORMAL LOW (ref 15–41)
Albumin: 2.8 g/dL — ABNORMAL LOW (ref 3.5–5.0)
Alkaline Phosphatase: 64 U/L (ref 38–126)
Anion gap: 9 (ref 5–15)
BUN: 20 mg/dL (ref 8–23)
CO2: 22 mmol/L (ref 22–32)
Calcium: 7.6 mg/dL — ABNORMAL LOW (ref 8.9–10.3)
Chloride: 106 mmol/L (ref 98–111)
Creatinine, Ser: 1.31 mg/dL — ABNORMAL HIGH (ref 0.44–1.00)
GFR, Estimated: 41 mL/min — ABNORMAL LOW (ref >60)
Glucose, Bld: 152 mg/dL — ABNORMAL HIGH (ref 70–99)
Potassium: 4.6 mmol/L (ref 3.5–5.1)
Sodium: 137 mmol/L (ref 135–145)
Total Bilirubin: 1 mg/dL (ref 0.3–1.2)
Total Protein: 6.5 g/dL (ref 6.5–8.1)

## 2020-02-15 LAB — PHOSPHORUS: Phosphorus: 3.8 mg/dL (ref 2.5–4.6)

## 2020-02-15 LAB — CBC WITH DIFFERENTIAL/PLATELET
Abs Immature Granulocytes: 0.07 10*3/uL (ref 0.00–0.07)
Basophils Absolute: 0.1 10*3/uL (ref 0.0–0.1)
Basophils Relative: 1 %
Eosinophils Absolute: 0 10*3/uL (ref 0.0–0.5)
Eosinophils Relative: 0 %
HCT: 35.2 % — ABNORMAL LOW (ref 36.0–46.0)
Hemoglobin: 11.2 g/dL — ABNORMAL LOW (ref 12.0–15.0)
Immature Granulocytes: 1 %
Lymphocytes Relative: 10 %
Lymphs Abs: 1.1 10*3/uL (ref 0.7–4.0)
MCH: 27.9 pg (ref 26.0–34.0)
MCHC: 31.8 g/dL (ref 30.0–36.0)
MCV: 87.6 fL (ref 80.0–100.0)
Monocytes Absolute: 0.6 10*3/uL (ref 0.1–1.0)
Monocytes Relative: 5 %
Neutro Abs: 8.9 10*3/uL — ABNORMAL HIGH (ref 1.7–7.7)
Neutrophils Relative %: 83 %
Platelets: 326 10*3/uL (ref 150–400)
RBC: 4.02 MIL/uL (ref 3.87–5.11)
RDW: 15.7 % — ABNORMAL HIGH (ref 11.5–15.5)
WBC: 10.7 10*3/uL — ABNORMAL HIGH (ref 4.0–10.5)
nRBC: 0 % (ref 0.0–0.2)

## 2020-02-15 LAB — MAGNESIUM: Magnesium: 2 mg/dL (ref 1.7–2.4)

## 2020-02-15 LAB — CREATININE, URINE, RANDOM: Creatinine, Urine: 102 mg/dL

## 2020-02-15 LAB — TSH: TSH: 0.821 u[IU]/mL (ref 0.350–4.500)

## 2020-02-15 LAB — SODIUM, URINE, RANDOM: Sodium, Ur: 39 mmol/L

## 2020-02-15 MED ORDER — METOCLOPRAMIDE HCL 5 MG/ML IJ SOLN: 5.0000 mg | Freq: Four times a day (QID) | INTRAMUSCULAR | Status: DC

## 2020-02-15 MED ORDER — SODIUM CHLORIDE 0.9 % IV SOLN
75.0000 mL/h | INTRAVENOUS | Status: AC
  Administered 2020-02-15: 75 mL/h via INTRAVENOUS

## 2020-02-15 MED ORDER — DARIFENACIN HYDROBROMIDE ER 7.5 MG PO TB24
7.5000 mg | ORAL_TABLET | Freq: Every day | ORAL | Status: DC
  Administered 2020-02-15 – 2020-02-22 (×8): 7.5 mg via ORAL
  Filled 2020-02-15 (×9): qty 1

## 2020-02-15 MED ORDER — MECLIZINE HCL 12.5 MG PO TABS
12.5000 mg | ORAL_TABLET | Freq: Two times a day (BID) | ORAL | Status: DC
  Administered 2020-02-15 – 2020-02-17 (×5): 12.5 mg via ORAL
  Filled 2020-02-15 (×6): qty 1

## 2020-02-15 MED ORDER — ALBUTEROL SULFATE (2.5 MG/3ML) 0.083% IN NEBU: 2.5000 mg | INHALATION_SOLUTION | RESPIRATORY_TRACT | Status: DC | PRN

## 2020-02-15 MED ORDER — ACETAMINOPHEN 325 MG PO TABS: 650.0000 mg | ORAL_TABLET | Freq: Four times a day (QID) | ORAL | Status: DC | PRN

## 2020-02-15 MED ORDER — INFLUENZA VAC A&B SA ADJ QUAD 0.5 ML IM PRSY
0.5000 mL | PREFILLED_SYRINGE | INTRAMUSCULAR | Status: DC
  Filled 2020-02-15: qty 0.5

## 2020-02-15 MED ORDER — SODIUM CHLORIDE 0.9 % IV SOLN
INTRAVENOUS | Status: DC | PRN
  Administered 2020-02-15 – 2020-02-18 (×5): 250 mL via INTRAVENOUS

## 2020-02-15 MED ORDER — IPRATROPIUM-ALBUTEROL 0.5-2.5 (3) MG/3ML IN SOLN
3.0000 mL | Freq: Three times a day (TID) | RESPIRATORY_TRACT | Status: DC
  Administered 2020-02-15 – 2020-02-17 (×5): 3 mL via RESPIRATORY_TRACT
  Filled 2020-02-15 (×6): qty 3

## 2020-02-15 MED ORDER — ACETAMINOPHEN 650 MG RE SUPP: 650.0000 mg | Freq: Four times a day (QID) | RECTAL | Status: DC | PRN

## 2020-02-15 MED ORDER — METOCLOPRAMIDE HCL 5 MG/ML IJ SOLN: 5.0000 mg | Freq: Four times a day (QID) | INTRAMUSCULAR | Status: DC | PRN

## 2020-02-15 MED ORDER — CHLORHEXIDINE GLUCONATE CLOTH 2 % EX PADS
6.0000 | MEDICATED_PAD | Freq: Every day | CUTANEOUS | Status: DC
  Administered 2020-02-15 – 2020-02-22 (×7): 6 via TOPICAL

## 2020-02-15 MED ORDER — DILTIAZEM HCL ER 60 MG PO CP12
60.0000 mg | ORAL_CAPSULE | Freq: Two times a day (BID) | ORAL | Status: DC
  Administered 2020-02-15 – 2020-02-17 (×6): 60 mg via ORAL
  Filled 2020-02-15 (×8): qty 1

## 2020-02-15 MED ORDER — GADOBUTROL 1 MMOL/ML IV SOLN
7.5000 mL | Freq: Once | INTRAVENOUS | Status: AC | PRN
  Administered 2020-02-15: 7.5 mL via INTRAVENOUS
  Filled 2020-02-15: qty 7.5

## 2020-02-15 MED ORDER — METOPROLOL TARTRATE 25 MG PO TABS
12.5000 mg | ORAL_TABLET | Freq: Two times a day (BID) | ORAL | Status: DC
  Administered 2020-02-15 – 2020-02-17 (×4): 12.5 mg via ORAL
  Filled 2020-02-15 (×5): qty 1

## 2020-02-15 MED ORDER — SODIUM CHLORIDE 0.9 % IV SOLN
1.0000 g | INTRAVENOUS | Status: DC
  Administered 2020-02-15 – 2020-02-18 (×4): 1 g via INTRAVENOUS
  Filled 2020-02-15: qty 10
  Filled 2020-02-15 (×2): qty 1
  Filled 2020-02-15: qty 10
  Filled 2020-02-15 (×2): qty 1

## 2020-02-15 NOTE — Consult Note (Signed)
[place]  [phone] [phone]   Name: Brittney Shepard Date: 02/15/2020 MRN: [id number]  DOB: [birth year]  Patient Care Team: Marguerita Merles, MD as PCP - General (Family Medicine) Kate Sable, MD as PCP - Cardiology (Cardiology) Clent Jacks, RN as Oncology Nurse Navigator    REASON FOR CONSULTATION: Brittney Shepard is a 69 y.o. female with multiple medical problems including history of CHF, COPD, A. fib not on anticoagulation, history of CVA, and morbid obesity, who was incidentally found to have a large bladder mass on imaging following a fall.Patient subsequently underwent TURBT on 11/20/2019 with ureteral stent placement by Dr. Diamantina Providence.Pathology revealed a high-grade urothelial carcinoma. Patient also underwent biopsy of a liver mass on 11/22/2019 with findings consistent for poorly differentiated carcinoma.   She is found to have PET positive endometrial mass with peritoneal adenopathy concerning for metastatic endometrial cancer.  Patient was not felt to be a surgical candidate for her stage IV cancer.  Patient was hospitalized 12/22/2019 -12/27/2019 with rapid A. fib and UTI.    Patient ultimately discharged home from rehab following her hospitalization.  She remained severely weak.  She had follow-up in the cancer center and was felt not to be a viable candidate for systemic treatment.  Patient and son opted instead to pursue hospice at home.  Patient was admitted to the hospital on 02/14/2020 from home with nausea and vertigo.  Patient was found to have acute right cerebellar and left frontal parietal CVAs on MRI.  Palliative care was consulted help address goals.  SOCIAL HISTORY:     reports that she has never smoked. She has never used smokeless tobacco. She reports that she does not drink alcohol and does not use drugs.  Patient is widowed for the past several years. She has a son who lives about  30 miles away. Her son has limited involvement as he is a single parent and employed full-time.  Patient lives at home alone. 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  ADVANCE DIRECTIVES:  Does not have  CODE STATUS: DNR  PAST MEDICAL HISTORY: Past Medical History:  Diagnosis Date  . A-fib ([place])   . Bladder cancer ([place])   . CHF (congestive heart failure) ([place])   . Chronic pain   . Cognitive communication deficit   . Diabetes mellitus without complication ([place])   . Hypertension   . Hypokalemia   . Muscle weakness (generalized)   . 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 Secondary malignant neoplasm of liver ([place])   . Stroke [facility]) 2013  . Urinary tract infection, site not specified     PAST SURGICAL HISTORY:  Past Surgical History:  Procedure Laterality Date  . CYSTOSCOPY W/ RETROGRADES N/A 11/20/2019   Procedure: CYSTOSCOPY WITH RETROGRADE PYELOGRAM;  Surgeon: Billey Co, MD;  Location: [facility] ORS;  Service: Urology;  Laterality: N/A;  . CYSTOSCOPY WITH STENT PLACEMENT Left 11/20/2019   Procedure: CYSTOSCOPY WITH STENT PLACEMENT;  Surgeon: Billey Co, MD;  Location: [facility] ORS;  Service: Urology;  Laterality: Left;  . TRANSURETHRAL RESECTION OF BLADDER TUMOR N/A 11/20/2019   Procedure: TRANSURETHRAL RESECTION OF BLADDER TUMOR (TURBT);  Surgeon: Billey Co, MD;  Location: [facility] ORS;  Service: Urology;  Laterality: N/A;    HEMATOLOGY/ONCOLOGY HISTORY:  Oncology History Overview Note  # 2021- Liver bx-poorly differentiated CK 7+ carcinoma- DIAGNOSIS:  A. LIVER; ULTRASOUND-GUIDED CORE NEEDLE BIOPSY:  -  POSITIVE FOR MALIGNANCY.  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   Comment:  Immunohistochemical studies show tumor cells to be positive for CK7 and  CK19, with focal faint expression of CD20. Tumor cells are negative for  CDX-2, GATA-3, TTF-1, and Pax-8. Additional stains were attempted, but  malignant tissue is exhausted  in the core utilized for these studies.   The immunohistochemical pattern is relatively non-specific. The  patient's recent diagnosis of invasive urothelial carcinoma is noted,  but the histologic pattern does not appear similar, and lack of staining  for GATA3 would suggest against this. Concern for a gynecologic  malignancy is also noted, but lack of marking for Pax-8 would suggest  against this. CK7 positivity may be seen in carcinomas arising in the  upper GI tract, lung, pancreatobiliary tree, and thymus, among other  sites. Lack of marking for TTF-1 would suggest against lung. The  presence of CK19 staining, while not specific, may be seen in carcinomas  arising from the pancreatobiliary tree, as well as urinary bladder,  endometrium, lung, and kidney. Recent laboratory testing indicates liver  function testing within normal limits. Clinical and radiographic  correlation is recommended.   # JULY 2021- cystocopy- DIAGNOSIS: A. 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  - NO MUSCULARIS PROPRIA IDENTIFIED.   B. 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  - MUSCULARIS PROPRIA PRESENT AND UNINVOLVED.   # ENDOMETRIAL MASS [Dr.Staebler]- unable to Bx- sec to cervical stenosis  # Mediastinal mass  Lobulated anterior mediastinal 4.1 x 3.6 cm soft tissue mass, indeterminate for nodal metastasis versus primary thymic neoplasm. No additional potential findings of metastatic disease in the chest. 3. Mild cardiomegaly. Three-vessel coronary atherosclerosis. 4. Left adrenal myelolipoma. 5. Exophytic isodense 1.3 cm renal cortical lesion in the lateral upper right kidney  # CHF-compensated; morbid obesity; diabetes; COPD [not on O2]; A. Fib- not on anticoagulation.     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[place])  11/22/2019 Initial Diagnosis   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[place])   12/04/2019 -  Chemotherapy   The patient had dexamethasone (DECADRON) 4 MG tablet, 8 mg, Oral, Daily, 0 of 1 cycle, Start date: --, End date: -- palonosetron (ALOXI) injection 0.25 mg, 0.25 mg, Intravenous,  Once, 0 of 6 cycles pegfilgrastim (NEULASTA ONPRO KIT) injection 6 mg, 6 mg, Subcutaneous, Once, 0 of 6 cycles CARBOplatin (PARAPLATIN) in sodium chloride 0.9 % 100 mL chemo infusion, , Intravenous,  Once, 0 of 6 cycles fosaprepitant (EMEND) 150 mg in sodium chloride 0.9 % 145 mL IVPB, 150 mg, Intravenous,  Once, 0 of 6 cycles PACLitaxel (TAXOL) 558 mg in sodium chloride 0.9 % 500 mL chemo infusion (> 67m/m2), 225 mg/m2, Intravenous,  Once, 0 of 6 cycles  for chemotherapy treatment.    [place] metastases [place]   Initial Diagnosis   [place]   12/04/2019 -  Chemotherapy   The patient had dexamethasone (DECADRON) 4 MG tablet, 8 mg, Oral, Daily, 0 of 1 cycle, Start date: --, End date: -- palonosetron (ALOXI) injection 0.25 mg, 0.25 mg, Intravenous,  Once, 0 of 6 cycles pegfilgrastim (NEULASTA ONPRO KIT) injection 6 mg, 6 mg, Subcutaneous, Once, 0 of 6 cycles CARBOplatin (PARAPLATIN) in sodium chloride 0.9 % 100 mL chemo infusion, , Intravenous,  Once, 0 of 6 cycles fosaprepitant (EMEND) 150 mg in sodium chloride 0.9 % 145 mL IVPB, 150 mg, Intravenous,  Once, 0 of 6 cycles PACLitaxel (TAXOL) 558 mg in sodium chloride 0.9 % 500 mL chemo infusion (> 36m/m2), 225 mg/m2, Intravenous,  Once, 0 of 6 cycles  for chemotherapy treatment.      ALLERGIES:  is allergic to penicillin g and sulfa antibiotics.  MEDICATIONS:  Current Facility-Administered Medications  Medication Dose Route Frequency Provider Last Rate Last Admin  . acetaminophen (TYLENOL) tablet 650 mg  650 mg Oral Q6H PRN DToy Baker MD       Or  . acetaminophen (TYLENOL) suppository 650 mg  650 mg  Rectal Q6H PRN Doutova, Anastassia, MD      . albuterol (PROVENTIL) (2.5 MG/3ML) 0.083% nebulizer solution 2.5 mg  2.5 mg Nebulization Q2H PRN Doutova, Anastassia, MD      . cefTRIAXone (ROCEPHIN) 1 g in sodium chloride 0.9 % 100 mL IVPB  1 g Intravenous Q24H Doutova, Anastassia, MD      . darifenacin (ENABLEX) 24 hr tablet 7.5 mg  7.5 mg Oral Daily Doutova, Anastassia, MD   7.5 mg at 02/15/20 0836  . diltiazem (CARDIZEM SR) 12 hr capsule 60 mg  60 mg Oral BID DToy Baker MD   60 mg at 02/15/20 0836  . insulin aspart (novoLOG) injection 0-9 Units  0-9 Units Subcutaneous Q4H DToy Baker MD   1 Units at 02/15/20 1144  . ipratropium-albuterol (DUONEB) 0.5-2.5 (3) MG/3ML nebulizer solution 3 mL  3 mL Nebulization TID Doutova, Anastassia, MD      . meclizine (ANTIVERT) tablet 12.5 mg  12.5 mg Oral BID ANolberto Hanlon MD   12.5 mg at 02/15/20 1630  . metoCLOPramide (REGLAN) injection 5 mg  5 mg Intravenous Q6H PRN Doutova, Anastassia, MD      . metoprolol tartrate (LOPRESSOR) tablet 12.5 mg  12.5 mg Oral BID Doutova, Anastassia, MD        VITAL SIGNS: BP (!) 138/47 (BP Location: Left Arm)   Pulse 86   Temp (!) 97.5 F (36.4 C) (Oral)   Resp 20   SpO2 97%  There were no vitals filed for this visit.  Estimated body mass index is 41.6 kg/m as calculated from the following:   Height as of 01/13/20: _0  (1.651 m).   Weight as of 01/15/20: 250 lb (113.4 kg).  LABS: CBC:    Component Value Date/Time   WBC 10.7 (H) 02/15/2020 0501   HGB 11.2 (L) 02/15/2020 0501   HCT 35.2 (L) 02/15/2020 0501   PLT 326 02/15/2020 0501   MCV 87.6 02/15/2020 0501   NEUTROABS 8.9 (H) 02/15/2020 0501   LYMPHSABS 1.1 02/15/2020 0501   MONOABS 0.6 02/15/2020 0501   EOSABS 0.0 02/15/2020 0501   BASOSABS 0.1 02/15/2020 0501   Comprehensive Metabolic Panel:    Component Value Date/Time   NA 137 02/15/2020 0501   K 4.6 02/15/2020 0501   CL 106 02/15/2020 0501   CO2 22 02/15/2020 0501   BUN 20  02/15/2020 0501   CREATININE 1.31 (H) 02/15/2020 0501   GLUCOSE 152 (H) 02/15/2020 0501   CALCIUM 7.6 (L) 02/15/2020 0501   AST 13 (L) 02/15/2020 0501   ALT 7 02/15/2020 0501   ALKPHOS 64 02/15/2020 0501   BILITOT 1.0 02/15/2020 0501   PROT 6.5 02/15/2020 0501   ALBUMIN 2.8 (L) 02/15/2020 0501    RADIOGRAPHIC STUDIES: CT Head Wo Contrast  Result Date: 02/14/2020 CLINICAL DATA:  Vertigo EXAM: CT HEAD WITHOUT CONTRAST TECHNIQUE: Contiguous axial images were obtained from the base of the skull through the  vertex without intravenous contrast. COMPARISON:  03/31/2009 FINDINGS: Brain: Ends level malacia secondary to remote left frontal infarct is present. Remote lacunar infarct noted within the left basal ganglia. Mild parenchymal volume loss is present, commensurate with the patient's age. Mild periventricular white matter changes are present likely reflecting the sequela of small vessel ischemia. No evidence of acute intracranial hemorrhage or infarct. No abnormal mass effect or midline shift. No abnormal intra or extra-axial mass lesion or fluid collection. The ventricular size is normal. Cerebellum is unremarkable. Vascular: No asymmetric hyperdense vasculature at the skull base. Skull: Intact Sinuses/Orbits: The visualized paranasal sinuses are clear. The visualized orbits are unremarkable. Other: Visualized mastoid air cells and middle ear cavities are clear. IMPRESSION: Remote infarcts. Senescent changes. No evidence of acute intracranial hemorrhage or infarct. Electronically Signed   By: Fidela Salisbury MD   On: 02/14/2020 22:11   MR BRAIN W WO CONTRAST  Addendum Date: 02/15/2020   ADDENDUM REPORT: 02/15/2020 17:00 ADDENDUM: These results were called by telephone at the time of interpretation on 02/15/2020 at 5:00 pm to provider Dr. Nolberto Hanlon, who verbally acknowledged these results. Electronically Signed   By: Kellie Simmering DO   On: 02/15/2020 17:00   Result Date: 02/15/2020 CLINICAL DATA:   Metastatic disease evaluation. EXAM: MRI HEAD WITHOUT AND WITH CONTRAST TECHNIQUE: Multiplanar, multiecho pulse sequences of the brain and surrounding structures were obtained without and with intravenous contrast. CONTRAST:  7.81m GADAVIST GADOBUTROL 1 MMOL/ML IV SOLN COMPARISON:  Noncontrast head CT 02/14/2020. FINDINGS: Brain: Mild intermittent motion degradation. This includes mild motion degradation of the axial and coronal postcontrast T1 weighted sequences. Mild generalized cerebral atrophy. No abnormal enhancement is demonstrated to suggest intracranial metastatic disease. There is patchy restricted diffusion within the inferomedial right cerebellar hemisphere and right cerebellar tonsil (PICA vascular territory) consistent with acute infarction. This encompasses a region measuring 4.6 x 1.9 cm in transaxial dimensions (series 5, image 11). Corresponding T2/FLAIR hyperintensity at this site. Additional 13 mm focus of diffusion weighted hyperintensity and corresponding intermediate signal on the ADC map consistent with acute/early subacute infarction (series 5, image 34) (series 7, image 15). Redemonstrated chronic left MCA territory cortical/subcortical infarct within the frontal lobe, left insula/subinsular white matter and deep left frontal white matter. Mild chronic blood products are present at this site. Chronic blood products are also present within the left midbrain likely reflecting sequela of prior hemorrhage. Moderate multifocal T2/FLAIR hyperintensity within the cerebral white matter is nonspecific, but consistent with chronic small vessel ischemic disease. No extra-axial fluid collection. No midline shift. Incidentally noted pineal gland cyst without suspicious masslike or nodular enhancement. Vascular: Expected proximal arterial flow voids. Skull and upper cervical spine: No focal suspicious marrow lesion. Sinuses/Orbits: Visualized orbits show no acute finding. Mild ethmoid sinus mucosal  thickening. Small right maxillary sinus mucous retention cyst. Trace fluid within bilateral mastoid air cells. IMPRESSION: Mildly motion degraded examination. No evidence of intracranial metastatic disease. Patchy acute infarcts within the right cerebellar hemisphere (PICA vascular territory) encompassing 4.6 x 1.9 cm in transaxial dimensions. 13 mm acute/early subacute infarct within the left frontoparietal subcortical white matter. Redemonstrated chronic left MCA territory cortical/subcortical infarct within the left frontal lobe, left insula/subinsular white matter and deep left frontal white matter. Background mild generalized cerebral atrophy and moderate cerebral white matter chronic small vessel ischemic disease. Remote blood products within the left midbrain indicative of prior hemorrhage at this site. Electronically Signed: By: KKellie SimmeringDO On: 02/15/2020 16:34    PERFORMANCE STATUS (  ECOG) : 3 - Symptomatic, >50% confined to bed  Review of Systems Unless otherwise noted, a complete review of systems is negative.  Physical Exam General: NAD, obese Pulmonary: Unlabored Extremities: no edema, no joint deformities Skin: no rashes Neurological: Weakness but otherwise nonfocal  IMPRESSION: Met with patient to discuss goals.  Case discussed with hospice liaison who is talked to patient's hospice nurse.  Apparently patient has been doing poorly at home.  She has limited support from family and has been relying significantly on hospice team for daily care.  Patient recognizes that she cannot remain at home.  She does not appear to be clinically appropriate for hospice IPU.  Unfortunately, she does not have Medicaid and so transfer to a rehab center would entail revocation of hospice.  Patient would like to try SNF at time of discharge from the hospital.  I would recommend that she be followed by palliative care at SNF to help coordinate her care going forward.  She would also benefit from social  work involvement to apply for Medicaid in the event that she needs long-term care.  Patient is currently comfortable appearing and denies any distressing symptoms.  Her dizziness has resolved.  PLAN: -Continue current scope of treatment -DNR/DNI -Rehab with palliative care following   Patient expressed understanding and was in agreement with this plan. She also understands that She can call the clinic at any time with any questions, concerns, or complaints.     Time Total: 30 minutes  Visit consisted of counseling and education dealing with the complex and emotionally intense issues of symptom management and palliative care in the setting of serious and potentially life-threatening illness.Greater than 50%  of this time was spent counseling and coordinating care related to the above assessment and plan.  Signed by: Altha Harm, PhD, NP-C

## 2020-02-15 NOTE — Progress Notes (Addendum)
PROGRESS NOTE    Brittney Shepard  AST:[id number] DOB: 01-18-1951 DOA: 02/14/2020 PCP: Marguerita Merles, MD    Brief Narrative:  Brittney Shepard is a 69 y.o. female with medical history significant of Bladder cancer s/p transurethral resection of bladder tumorwith secondary malignant neoplasm of liver, stroke, congestive heart failure, atrial fibrillation not on anticoagultion, diabetes, hypertension, chronic pain.Acquired thrombophilia endometrial mass, morbid obesity frequent falls, COPD, indwelling catheter, mediastinal mass Presented with   feeling dizziness with movement for past 1 day and had an episode of vomiting. Describes vertigo-like sensation which is worse when she is trying to move.  10/14-had an episode of dizziness trying to get up this am. No other complaints.  Consultants:   oncology  Procedures:  CT head Remote infarcts. Senescent changes. No evidence of acute intracranial hemorrhage or infarct.    Antimicrobials:       Subjective: No dizziness after am episode   Objective: Vitals:   02/15/20 0910 02/15/20 1000 02/15/20 1100 02/15/20 1200  BP: (!) 120/92 110/80 112/70 115/81  Pulse: 97 75 82 81  Resp: 20 20 (!) 25 20  Temp:    98.6 F (37 C)  TempSrc:    Oral  SpO2: 99% 96% 93% 97%    Intake/Output Summary (Last 24 hours) at 02/15/2020 1450 Last data filed at 02/14/2020 2241 Gross per 24 hour  Intake 100 ml  Output --  Net 100 ml   There were no vitals filed for this visit.  Examination:  General exam: Appears calm and comfortable  Respiratory system: Clear to auscultation. Respiratory effort normal. Cardiovascular system: S1 & S2 heard, RRR. No JVD, murmurs, rubs, gallops or clicks.  Gastrointestinal system: Abdomen is nondistended, soft and nontender. Normal bowel sounds heard. Central nervous system: Alert and oriented.  Extremities: mild edema b/l Skin: warm, dry Psychiatry: Judgement and insight appear normal. Mood & affect  appropriate.     Data Reviewed: I have personally reviewed following labs and imaging studies  CBC: Recent Labs  Lab 02/14/20 1949 02/15/20 0501  WBC 16.0* 10.7*  NEUTROABS  --  8.9*  HGB 12.3 11.2*  HCT 39.6 35.2*  MCV 86.5 87.6  PLT 430* 989   Basic Metabolic Panel: Recent Labs  Lab 02/14/20 1949 02/15/20 0501  NA 135 137  K 4.7 4.6  CL 102 106  CO2 20* 22  GLUCOSE 209* 152*  BUN 21 20  CREATININE 1.54* 1.31*  CALCIUM 8.0* 7.6*  MG  --  2.0  PHOS  --  3.8   GFR: CrCl cannot be calculated (Unknown ideal weight.). Liver Function Tests: Recent Labs  Lab 02/15/20 0501  AST 13*  ALT 7  ALKPHOS 64  BILITOT 1.0  PROT 6.5  ALBUMIN 2.8*   No results for input(s): LIPASE, AMYLASE in the last 168 hours. No results for input(s): AMMONIA in the last 168 hours. Coagulation Profile: No results for input(s): INR, PROTIME in the last 168 hours. Cardiac Enzymes: No results for input(s): CKTOTAL, CKMB, CKMBINDEX, TROPONINI in the last 168 hours. BNP (last 3 results) No results for input(s): PROBNP in the last 8760 hours. HbA1C: No results for input(s): HGBA1C in the last 72 hours. CBG: Recent Labs  Lab 02/15/20 0031 02/15/20 0404 02/15/20 0833 02/15/20 1140  GLUCAP 184* 161* 134* 125*   Lipid Profile: No results for input(s): CHOL, HDL, LDLCALC, TRIG, CHOLHDL, LDLDIRECT in the last 72 hours. Thyroid Function Tests: Recent Labs    02/15/20 0501  TSH 0.821  Anemia Panel: No results for input(s): VITAMINB12, FOLATE, FERRITIN, TIBC, IRON, RETICCTPCT in the last 72 hours. Sepsis Labs: No results for input(s): PROCALCITON, LATICACIDVEN in the last 168 hours.  Recent Results (from the past 240 hour(s))  Respiratory Panel by RT PCR (Flu A&B, Covid) - Nasopharyngeal Swab     Status: None   Collection Time: 02/14/20  9:45 PM   Specimen: Nasopharyngeal Swab  Result Value Ref Range Status   SARS Coronavirus 2 by RT PCR NEGATIVE NEGATIVE Final    Comment:  (NOTE) SARS-CoV-2 target nucleic acids are NOT DETECTED.  The SARS-CoV-2 RNA is generally detectable in upper respiratoy specimens during the acute phase of infection. The lowest concentration of SARS-CoV-2 viral copies this assay can detect is 131 copies/mL. A negative result does not preclude SARS-Cov-2 infection and should not be used as the sole basis for treatment or other patient management decisions. A negative result may occur with  improper specimen collection/handling, submission of specimen other than nasopharyngeal swab, presence of viral mutation(s) within the areas targeted by this assay, and inadequate number of viral copies (<131 copies/mL). A negative result must be combined with clinical observations, patient history, and epidemiological information. The expected result is Negative.  Fact Sheet for Patients:  PinkCheek.be  Fact Sheet for Healthcare Providers:  GravelBags.it  This test is no t yet approved or cleared by the [place] FDA and  has been authorized for detection and/or diagnosis of SARS-CoV-2 by FDA under an Emergency Use Authorization (EUA). This EUA will remain  in effect (meaning this test can be used) for the duration of the COVID-19 declaration under Section 564(b)(1) of the Act, 21 U.S.C. section 360bbb-3(b)(1), unless the authorization is terminated or revoked sooner.     Influenza A by PCR NEGATIVE NEGATIVE Final   Influenza B by PCR NEGATIVE NEGATIVE Final    Comment: (NOTE) The Xpert Xpress SARS-CoV-2/FLU/RSV assay is intended as an aid in  the diagnosis of influenza from Nasopharyngeal swab specimens and  should not be used as a sole basis for treatment. Nasal washings and  aspirates are unacceptable for Xpert Xpress SARS-CoV-2/FLU/RSV  testing.  Fact Sheet for Patients: PinkCheek.be  Fact Sheet for Healthcare  Providers: GravelBags.it  This test is not yet approved or cleared by the [place] FDA and  has been authorized for detection and/or diagnosis of SARS-CoV-2 by  FDA under an Emergency Use Authorization (EUA). This EUA will remain  in effect (meaning this test can be used) for the duration of the  Covid-19 declaration under Section 564(b)(1) of the Act, 21  U.S.C. section 360bbb-3(b)(1), unless the authorization is  terminated or revoked. Performed at [facility], [address], [place], [place] [address]          Radiology Studies: CT Head Wo Contrast  Result Date: 02/14/2020 CLINICAL DATA:  Vertigo EXAM: CT HEAD WITHOUT CONTRAST TECHNIQUE: Contiguous axial images were obtained from the base of the skull through the vertex without intravenous contrast. COMPARISON:  03/31/2009 FINDINGS: Brain: Ends level malacia secondary to remote left frontal infarct is present. Remote lacunar infarct noted within the left basal ganglia. Mild parenchymal volume loss is present, commensurate with the patient's age. Mild periventricular white matter changes are present likely reflecting the sequela of small vessel ischemia. No evidence of acute intracranial hemorrhage or infarct. No abnormal mass effect or midline shift. No abnormal intra or extra-axial mass lesion or fluid collection. The ventricular size is normal. Cerebellum is unremarkable. Vascular: No asymmetric hyperdense  vasculature at the skull base. Skull: Intact Sinuses/Orbits: The visualized paranasal sinuses are clear. The visualized orbits are unremarkable. Other: Visualized mastoid air cells and middle ear cavities are clear. IMPRESSION: Remote infarcts. Senescent changes. No evidence of acute intracranial hemorrhage or infarct. Electronically Signed   By: Fidela Salisbury MD   On: 02/14/2020 22:11        Scheduled Meds: . darifenacin  7.5 mg Oral Daily  . diltiazem  60 mg Oral BID  . insulin  aspart  0-9 Units Subcutaneous Q4H  . ipratropium-albuterol  3 mL Nebulization TID  . metoprolol tartrate  12.5 mg Oral BID   Continuous Infusions: . cefTRIAXone (ROCEPHIN)  IV      Assessment & Plan:   Active Problems:   Atrial fibrillation with RVR (HCC)   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HCC)   Essential hypertension   Type 2 diabetes mellitus without complication, without long-term current use of insulin (HCC)   Morbid obesity with BMI of 45.0-49.9, adult (HCC)   Chronic diastolic CHF (congestive heart failure) (HCC)   Endometrial cancer (HCC)   COPD with acute bronchitis (HCC)   Vertigo   Dehydration   AKI (acute kidney injury) ([place])   69 y.o. female with medical history significant of Bladder cancer s/p transurethral resection of bladder tumorwith secondary malignant neoplasm of liver, stroke, congestive heart failure, atrial fibrillation not on anticoagultion, diabetes, hypertension, chronic pain.Acquired thrombophilia endometrial mass, morbid obesity frequent falls, COPD, indwelling catheter, mediastinal mass admitted for AKI, dehydration, dizziness.  Vertigo- Will start her on meclizine. Did receive a dose. Appears sx starting to improve.  MRI brain pending to r/o mets as the cause   . 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[place]) widely metastatic -oncology consulted. She is receiving hospice care with our care PT/OT- recommend SNF Oncology consulted  . Essential hypertension -continue  If blood pressure Allows change Atenolol to Metoprolol given AKi  . Endometrial cancer [facility]) - Oncology consulted   . Chronic diastolic CHF (congestive heart failure) (HCC) - - no in exacerbation.  Since on dry side, will hold home diuretic.  Monitor fluid status.    . Atrial fibrillation with RVR (HCC) -HR better. Continue with beta blk and cardizem  . COPD with hx/o bronchitis ([place]) - continue home meds.    . Dehydration - will gently rehydrate.  Marland Kitchen AKI (acute kidney  injury) ([place]) - - likely secondary to dehydration Improving with IV fluids Avoid nephrotoxic medications Pending   UTi - pt with indwelling catheter, unsure if true infection vs colonization We will continue with IV Rocephin Follow-up urine culture   DVT prophylaxis: scd Code Status:dnr Family Communication: none at bedside  Status is: Observation  The patient remains OBS appropriate and will d/c before 2 midnights.  Dispo: The patient is from: Home              Anticipated d/c is to: Home              Anticipated d/c date is: 1 day              Patient currently is not medically stable to d/c.      ADDENDUM: MRI with acute stroke, see full result. Neurology consulted, Dr. Doy Mince consulted. Spoke to her about MRI , recommended with her acute stroke and hx/o afib should be treated with anticoagulation.  Not sure why patient was not on anticoagulation previously need to find this out before starting her on anticoagulation.  I have sent the chart  to oncology about this too.      LOS: 0 days   Time spent: 45 min with >50% on coc    Nolberto Hanlon, MD [facility] Pager [phone]  If 7PM-7AM, please contact night-coverage [email or web address] Password [email or web address] 02/15/2020, 2:50 PM

## 2020-02-15 NOTE — Evaluation (Signed)
Occupational Therapy Evaluation Patient Details Name: Brittney Shepard MRN: [id number] DOB: 03-Apr-1951 Today's Date: 02/15/2020    History of Present Illness 69 y.o. female with medical history significant of Bladder cancer s/p transurethral resection of bladder tumor  with secondary malignant neoplasm of liver, stroke, congestive heart failure, atrial fibrillation not on anticoagultion, diabetes, hypertension, chronic pain.Acquired thrombophilia  endometrial mass, morbid obesity frequent falls, COPD, indwelling catheter, mediastinal mass.  Here with weakness, dizziness.   Clinical Impression   Pt was seen for OT evaluation this date. Prior to hospital admission, pt was MOD I with fxl mobility using RW in the home and reports she has some brief caregiver assistance with bathing and light house work every day. Pt lives alone in [place]. Currently pt demonstrates impairments as described below (See OT problem list) which functionally limit her ability to perform ADL/self-care tasks. Pt currently requires MAX to TOTAL A with LB ADLs and declines to perform bed mobility or transfers with OT citing dizziness and nausea.  Pt would benefit from skilled OT services to address noted impairments and functional limitations (see below for any additional details) in order to maximize safety and independence while minimizing falls risk and caregiver burden. Upon hospital discharge, recommend STR to maximize pt safety and return to PLOF.     Follow Up Recommendations  SNF;Other (comment) (if pt has to resume hospice services in the home setting, anticipate HHOT could be beneficial to accompany home hospice if pt wants to preserve INDEP with self care)    Equipment Recommendations  3 in 1 bedside commode    Recommendations for Other Services       Precautions / Restrictions Precautions Precautions: Fall Restrictions Weight Bearing Restrictions: No      Mobility Bed Mobility               General  bed mobility comments: NT on OT evlauation as pt reports fatigued from PT and nauseous  Transfers                 General transfer comment: NT this date, pt c/o feeling weak, nauseous and dizzy    Balance       Sitting balance - Comments: NT on OT assessment, pt declines citing nausea after mobilizing with PT earlier this AM                                   ADL either performed or assessed with clinical judgement   ADL Overall ADL's : Needs assistance/impaired                                       General ADL Comments: SETUP for bed level (with HOB elevated) UB ADLs including oral care performed during session. MAX to TOTAL A for LB ADLs at bed level, pt with low tolerance for bending, c/o nausea this date.     Vision Patient Visual Report: No change from baseline       Perception     Praxis      Pertinent Vitals/Pain Pain Assessment: Faces Faces Pain Scale: Hurts little more Pain Location: reports back pain from laying in bed Pain Intervention(s): Monitored during session     Hand Dominance Right   Extremity/Trunk Assessment Upper Extremity Assessment Upper Extremity Assessment: Generalized weakness (shld ROM to 1/2 range B'ly, grip grossly  4-/5.)   Lower Extremity Assessment Lower Extremity Assessment: Generalized weakness       Communication Communication Communication: No difficulties   Cognition Arousal/Alertness: Awake/alert Behavior During Therapy: WFL for tasks assessed/performed Overall Cognitive Status: Within Functional Limits for tasks assessed                                 General Comments: somewhat conflicting PLOF information given by pt, but pt is appropriate and able to follow commands.   General Comments       Exercises Other Exercises Other Exercises: OT facilitates ed re: role of OT in acute setting, importance of OOB activity, importance of doing what self care she can for herself  (as pt reports the hospice aides don't do all the work for her for bathing and expresses some frustration with this). Pt with moderate understanding, will require f/u.   Shoulder Instructions      Home Living Family/patient expects to be discharged to:: Private residence Living Arrangements: Alone Available Help at Discharge: Personal care attendant (hospice care aides for "a few" hours QD)   Home Access: Ramped entrance                     [place]: Walker - 2 wheels          Prior Functioning/Environment Level of Independence: Needs assistance  Gait / Transfers Assistance Needed: pt reports getting around home with RW by herself ADL's / Homemaking Assistance Needed: Reports having hospice caregivers that help briefly each day with bathing (reports bed baths, states her shower is broken) and light housework including dishes/laundry.            OT Problem List: Decreased strength;Decreased range of motion;Decreased activity tolerance;Impaired balance (sitting and/or standing);Decreased knowledge of use of DME or AE;Cardiopulmonary status limiting activity      OT Treatment/Interventions: Self-care/ADL training;DME and/or AE instruction;Therapeutic activities;Therapeutic exercise;Patient/family education    OT Goals(Current goals can be found in the care plan section) Acute Rehab OT Goals Patient Stated Goal: get rid of this nausea OT Goal Formulation: With patient Time For Goal Achievement: 02/29/20 Potential to Achieve Goals: Fair ADL Goals Pt Will Perform Grooming: with set-up;sitting (to complete 2-3 g/h tasks to improve seated fxl activity tolerance) Pt Will Transfer to Toilet: ambulating;bedside commode;grab bars;with min assist (with LRAD to commode) Pt Will Perform Toileting - Clothing Manipulation and hygiene: with min assist;sit to/from stand  OT Frequency: Min 1X/week   Barriers to D/C:            Co-evaluation              AM-PAC OT "6  Clicks" Daily Activity     Outcome Measure Help from another person eating meals?: None Help from another person taking care of personal grooming?: A Little Help from another person toileting, which includes using toliet, bedpan, or urinal?: A Lot Help from another person bathing (including washing, rinsing, drying)?: A Lot Help from another person to put on and taking off regular upper body clothing?: A Little Help from another person to put on and taking off regular lower body clothing?: Total 6 Click Score: 15   End of Session Nurse Communication: Mobility status  Activity Tolerance: Patient limited by fatigue;Other (comment) (limited by nausea) Patient left: in bed;with call bell/phone within reach  OT Visit Diagnosis: Unsteadiness on feet (R26.81);Muscle weakness (generalized) (M62.81)  Time: 1165-7903 OT Time Calculation (min): 16 min Charges:  OT General Charges $OT Visit: 1 Visit OT Evaluation $OT Eval Moderate Complexity: 1 Mod OT Treatments $Self Care/Home Management : 8-22 mins  Gerrianne Scale, MS, OTR/L ascom [phone] 02/15/20, 2:41 PM

## 2020-02-15 NOTE — ED Notes (Signed)
MRI calling for pt. Pt refusing at this time. MRI will call back later.

## 2020-02-15 NOTE — Consult Note (Signed)
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 Roel Cluck  Date of visit: 02/15/2020    History of presenting illness-patient is a 69 year old female with a past medical history significant for superficial bladder carcinoma s/p TURBT also found to have endometrial mass and liver metastases possibly from endometrial primary.  Endometrial biopsy could not be obtained and it was not conclusively proven that the liver metastases was indeed from endometrial cancer.  I see her as an outpatient and given her poor performance status decision was made not to proceed with any systemic chemotherapy.  Patient was last seen by me as well as palliative care on 01/15/2020 and at that time plan was made to transition her to home hospice.  He now presents with worsening dizziness and vertigo-like symptoms.    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 Patient was living alone prior to this admission with home hospice but was finding it difficult to perform her ADL's. Son lives about 30 min away  ECOG PS- 3  Pain scale- 0   Review of systems- Review of Systems  Constitutional: Positive for malaise/fatigue. Negative for chills, fever and weight loss.  HENT: Negative for congestion, ear discharge and nosebleeds.   Eyes: Negative for blurred vision.  Respiratory: Negative for cough, hemoptysis, sputum production, shortness of breath and wheezing.   Cardiovascular: Negative for chest pain, palpitations, orthopnea and claudication.  Gastrointestinal: Negative for abdominal pain, blood in  stool, constipation, diarrhea, heartburn, melena, nausea and vomiting.  Genitourinary: Negative for dysuria, flank pain, frequency, hematuria and urgency.  Musculoskeletal: Negative for back pain, joint pain and myalgias.  Skin: Negative for rash.  Neurological: Positive for dizziness. Negative for tingling, focal weakness, seizures, weakness and headaches.  Endo/Heme/Allergies: Does not bruise/bleed easily.  Psychiatric/Behavioral: Negative for depression and suicidal ideas. The patient does not have insomnia.     Allergies  Allergen Reactions  . Penicillin G Hives  . Sulfa Antibiotics Rash    Patient Active Problem List   Diagnosis Date Noted  . COPD with acute bronchitis ([place]) 02/14/2020  . Vertigo 02/14/2020  . Dehydration 02/14/2020  . AKI (acute kidney injury) ([place]) 02/14/2020  . Palliative care encounter   . Hypokalemia 12/22/2019  . Endometrial cancer ([place]) 12/20/2019  . Goals of care, counseling/discussion 12/04/2019  . 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 Weakness   . Liver metastases ([place])   . Essential hypertension   . Type 2 diabetes mellitus without complication, without long-term current use of insulin ([place])   . Morbid obesity with BMI of 45.0-49.9, adult ([place])   . Atrial fibrillation ([place]) 11/19/2019  . Hematuria, gross   . Postmenopausal bleeding   . Abnormal endometrial ultrasound   . Atrial fibrillation with RVR ([place]) 11/18/2019  . Bladder mass   . Fall   . Acute cystitis without hematuria   . Chronic diastolic CHF (congestive heart failure) ([place]) 05/27/2016  . Primary osteoarthritis of both knees 05/27/2016  . Progressive anemia 05/27/2016  . DNR (do not resuscitate)   . Palliative care by specialist   . Acute respiratory failure with hypoxia and hypercapnia ([place])   .  Atrial fibrillation with rapid ventricular response ([place]) 03/20/2016  . Chronic obstructive pulmonary disease with acute exacerbation (HCC)      Past Medical History:   Diagnosis Date  . A-fib ([place])   . Bladder cancer ([place])   . CHF (congestive heart failure) ([place])   . Chronic pain   . Cognitive communication deficit   . Diabetes mellitus without complication ([place])   . Hypertension   . Hypokalemia   . Muscle weakness (generalized)   . 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 Secondary malignant neoplasm of liver ([place])   . Stroke [facility]) 2013  . Urinary tract infection, site not specified      Past Surgical History:  Procedure Laterality Date  . CYSTOSCOPY W/ RETROGRADES N/A 11/20/2019   Procedure: CYSTOSCOPY WITH RETROGRADE PYELOGRAM;  Surgeon: Billey Co, MD;  Location: [facility] ORS;  Service: Urology;  Laterality: N/A;  . CYSTOSCOPY WITH STENT PLACEMENT Left 11/20/2019   Procedure: CYSTOSCOPY WITH STENT PLACEMENT;  Surgeon: Billey Co, MD;  Location: [facility] ORS;  Service: Urology;  Laterality: Left;  . TRANSURETHRAL RESECTION OF BLADDER TUMOR N/A 11/20/2019   Procedure: TRANSURETHRAL RESECTION OF BLADDER TUMOR (TURBT);  Surgeon: Billey Co, MD;  Location: [facility] ORS;  Service: Urology;  Laterality: N/A;    Social History   Socioeconomic History  . Marital status: Widowed    Spouse name: Not on file  . Number of children: Not on file  . Years of education: Not on file  . Highest education level: Not on file  Occupational History  . Not on file  Tobacco Use  . Smoking status: Never Smoker  . Smokeless tobacco: Never Used  Substance and Sexual Activity  . Alcohol use: No  . Drug use: No  . Sexual activity: Not Currently  Other Topics Concern  . Not on file  Social History Narrative   Retd. 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 Has one son.    Social Determinants of Health   Financial Resource Strain:   . Difficulty of Paying Living Expenses: Not on file  Food Insecurity:   . Worried About Charity fundraiser in the Last Year: Not on file  . Ran Out of Food in the Last Year: Not on file   Transportation Needs:   . Lack of Transportation (Medical): Not on file  . Lack of Transportation (Non-Medical): Not on file  Physical Activity:   . Days of Exercise per Week: Not on file  . Minutes of Exercise per Session: Not on file  Stress:   . Feeling of Stress : Not on file  Social Connections:   . Frequency of Communication with Friends and Family: Not on file  . Frequency of Social Gatherings with Friends and Family: Not on file  . Attends Religious Services: Not on file  . Active Member of Clubs or Organizations: Not on file  . Attends Archivist Meetings: Not on file  . Marital Status: Not on file  Intimate Partner Violence:   . Fear of Current or Ex-Partner: Not on file  . Emotionally Abused: Not on file  . Physically Abused: Not on file  . Sexually Abused: Not on file     Family History  Problem Relation Age of Onset  . Ovarian cancer Mother   . Cancer Sister      Current Facility-Administered Medications:  .  acetaminophen (TYLENOL) tablet 650 mg, 650 mg, Oral, Q6H PRN **OR** acetaminophen (  TYLENOL) suppository 650 mg, 650 mg, Rectal, Q6H PRN, Doutova, Anastassia, MD .  albuterol (PROVENTIL) (2.5 MG/3ML) 0.083% nebulizer solution 2.5 mg, 2.5 mg, Nebulization, Q2H PRN, Doutova, Anastassia, MD .  cefTRIAXone (ROCEPHIN) 1 g in sodium chloride 0.9 % 100 mL IVPB, 1 g, Intravenous, Q24H, Doutova, Anastassia, MD .  darifenacin (ENABLEX) 24 hr tablet 7.5 mg, 7.5 mg, Oral, Daily, Doutova, Anastassia, MD, 7.5 mg at 02/15/20 0836 .  diltiazem (CARDIZEM SR) 12 hr capsule 60 mg, 60 mg, Oral, BID, Doutova, Anastassia, MD, 60 mg at 02/15/20 0836 .  insulin aspart (novoLOG) injection 0-9 Units, 0-9 Units, Subcutaneous, Q4H, Doutova, Anastassia, MD, 1 Units at 02/15/20 1144 .  ipratropium-albuterol (DUONEB) 0.5-2.5 (3) MG/3ML nebulizer solution 3 mL, 3 mL, Nebulization, TID, Doutova, Anastassia, MD .  meclizine (ANTIVERT) tablet 12.5 mg, 12.5 mg, Oral, BID, Kurtis Bushman, Sahar,  MD, 12.5 mg at 02/15/20 1630 .  metoCLOPramide (REGLAN) injection 5 mg, 5 mg, Intravenous, Q6H PRN, Doutova, Anastassia, MD .  metoprolol tartrate (LOPRESSOR) tablet 12.5 mg, 12.5 mg, Oral, BID, Doutova, Anastassia, MD  Current Outpatient Medications:  .  acetaminophen (TYLENOL) 325 MG tablet, Take 2 tablets (650 mg total) by mouth every 6 (six) hours as needed for mild pain (or Fever >/= 101)., Disp: 60 tablet, Rfl: 0 .  diltiazem (CARDIZEM SR) 60 MG 12 hr capsule, Take 1 capsule (60 mg total) by mouth every 12 (twelve) hours. (Patient taking differently: Take 60 mg by mouth 2 (two) times daily. ), Disp: 60 capsule, Rfl: 0 .  furosemide (LASIX) 40 MG tablet, Take 1 tablet (40 mg total) by mouth 2 (two) times daily., Disp: 30 tablet, Rfl: 0 .  metFORMIN (GLUCOPHAGE) 500 MG tablet, Take 500 mg by mouth daily with breakfast. , Disp: , Rfl:  .  potassium chloride (KLOR-CON) 10 MEQ tablet, Take 10 mEq by mouth 2 (two) times daily., Disp: , Rfl:  .  VESICARE 10 MG tablet, Take 10 mg by mouth daily., Disp: , Rfl:  .  apixaban (ELIQUIS) 2.5 MG TABS tablet, Take 1 tablet (2.5 mg total) by mouth 2 (two) times daily. (Patient not taking: Reported on 01/15/2020), Disp: 60 tablet, Rfl: 1 .  atenolol (TENORMIN) 25 MG tablet, Take 1 tablet (25 mg total) by mouth daily. (Patient not taking: Reported on 02/15/2020), Disp: 30 tablet, Rfl: 1 .  ipratropium-albuterol (DUONEB) 0.5-2.5 (3) MG/3ML SOLN, Take 3 mLs by nebulization 3 (three) times daily. (Patient not taking: Reported on 01/15/2020), Disp: 360 mL, Rfl: 0 .  Multiple Vitamin (MULTIVITAMIN) tablet, Take 1 tablet by mouth daily. (Patient not taking: Reported on 01/15/2020), Disp: , Rfl:  .  polyethylene glycol (MIRALAX / GLYCOLAX) 17 g packet, Take 17 g by mouth daily as needed for mild constipation. (Patient not taking: Reported on 01/15/2020), Disp: 14 each, Rfl: 0 .  senna-docusate (SENOKOT-S) 8.6-50 MG tablet, Take 1 tablet by mouth 2 (two) times daily.  (Patient not taking: Reported on 01/15/2020), Disp: 60 tablet, Rfl: 0   Physical exam:  Vitals:   02/15/20 0910 02/15/20 1000 02/15/20 1100 02/15/20 1200  BP: (!) 120/92 110/80 112/70 115/81  Pulse: 97 75 82 81  Resp: 20 20 (!) 25 20  Temp:    98.6 F (37 C)  TempSrc:    Oral  SpO2: 99% 96% 93% 97%   Physical Exam Constitutional:      Comments: She is obese and laying in bed. Has oxygen on  Cardiovascular:     Rate and Rhythm: Normal rate and  regular rhythm.     Heart sounds: Normal heart sounds.  Pulmonary:     Effort: Pulmonary effort is normal.  Abdominal:     Palpations: Abdomen is soft.     Comments: Foley draining clear urine. Obese abdomen  Musculoskeletal:     Cervical back: Normal range of motion.  Skin:    General: Skin is warm and dry.  Neurological:     Mental Status: She is alert and oriented to person, place, and time.        CMP Latest Ref Rng & Units 02/15/2020  Glucose 70 - 99 mg/dL 152(H)  BUN 8 - 23 mg/dL 20  Creatinine 0.44 - 1.00 mg/dL 1.31(H)  Sodium 135 - 145 mmol/L 137  Potassium 3.5 - 5.1 mmol/L 4.6  Chloride 98 - 111 mmol/L 106  CO2 22 - 32 mmol/L 22  Calcium 8.9 - 10.3 mg/dL 7.6(L)  Total Protein 6.5 - 8.1 g/dL 6.5  Total Bilirubin 0.3 - 1.2 mg/dL 1.0  Alkaline Phos 38 - 126 U/L 64  AST 15 - 41 U/L 13(L)  ALT 0 - 44 U/L 7   CBC Latest Ref Rng & Units 02/15/2020  WBC 4.0 - 10.5 K/uL 10.7(H)  Hemoglobin 12.0 - 15.0 g/dL 11.2(L)  Hematocrit 36 - 46 % 35.2(L)  Platelets 150 - 400 K/uL 326    @IMAGES @  CT Head Wo Contrast  Result Date: 02/14/2020 CLINICAL DATA:  Vertigo EXAM: CT HEAD WITHOUT CONTRAST TECHNIQUE: Contiguous axial images were obtained from the base of the skull through the vertex without intravenous contrast. COMPARISON:  03/31/2009 FINDINGS: Brain: Ends level malacia secondary to remote left frontal infarct is present. Remote lacunar infarct noted within the left basal ganglia. Mild parenchymal volume loss is present,  commensurate with the patient's age. Mild periventricular white matter changes are present likely reflecting the sequela of small vessel ischemia. No evidence of acute intracranial hemorrhage or infarct. No abnormal mass effect or midline shift. No abnormal intra or extra-axial mass lesion or fluid collection. The ventricular size is normal. Cerebellum is unremarkable. Vascular: No asymmetric hyperdense vasculature at the skull base. Skull: Intact Sinuses/Orbits: The visualized paranasal sinuses are clear. The visualized orbits are unremarkable. Other: Visualized mastoid air cells and middle ear cavities are clear. IMPRESSION: Remote infarcts. Senescent changes. No evidence of acute intracranial hemorrhage or infarct. Electronically Signed   By: Fidela Salisbury MD   On: 02/14/2020 22:11    Assessment and plan- Patient is a 69 y.o. 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  Patient admitted for dehydration and dizziness.  Acute cerebellar strooke- ok to start anticoagulation from oncology standpoint. She has a h/o afib but anticoagulation was held in the past when she had presented with hematuria.   Metastatic carcinoma:  From an oncology standpoint my plan remains unchanged.  She is not a candidate for any systemic treatment and should continue with hospice services.  Patient at baseline lives alone and appears to be declining and may benefit from long-term care with hospice services.  If plan is to go to aubacute rehab, hospice will be on hold until rehab period is over and can be restarted after that. I have also asked palliative care to see her her.  She is already a DNR/DNI. CBC and electrolytes are fairly unremarkable.    Visit Diagnosis 1. Dizziness   2. Non-intractable vomiting with nausea, unspecified vomiting type   3. 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 AKI (acute  kidney injury) [facility])     Dr. Randa Evens, MD, MPH [facility] [phone] 02/15/2020

## 2020-02-15 NOTE — ED Notes (Signed)
5am labs obtained with 23g straight stick. Sent to lab

## 2020-02-15 NOTE — Progress Notes (Signed)
[facility] hospital liaison note:  Patient is currently followed by [facility] hospice services at home with a hospice diagnosis of bladder cancer with metastases to the liver and intrahepatic bile duct.   She is a DNR code with out of facility DNR in place in the home. She is being admitted for treatment of dizziness/UTI. This is a related admission per hospice physician Dr. Lyman Speller.  Patient called EMS for transport to the [facility] ED yesterday evening due to increased weakness and dizziness, increased with movement. Hospice was not notified prior to transport.   Patient visited at bedside in the ED. She appeared to be sleeping but was easily awakened by voice. She was able to describe her symptoms that she experienced. Writer gently reminded her about calling hospice before EMS.  Patient is currently on 2 liters of oxygen with saturations fluctuating be tween 85%-97% through out the visit. She does not have oxygen in the home. She required assistance with her meal tray when it came. She also reported some nausea. Antivert has been prescribed for Vertigo. Patient lives alone. She gets Meals on Wheels and needs assistance with heating them, she does not prepare her own food. She does have a daily hospice aide to assist with personal care. Per report of her hospice RN, patient does ambulate to the bathroom when she needs to have a bowel movemen but other wise is sedentary.  Foley catheter was placed at home due to urinary retention.   She was assessed prior to liaison visit by PT, who have recommended short term rehab. Patient does not appear safe to return home. Report exchanged with beside RN [place] and TOC [employer]. Will continue to follow and assist with discharge planning and update hospice team.  VS:98.6 oral, 115/81, 84, 97% on 2 liters of oxygen I/O: 100/525 IV/PRN Meds: Rocephin 1 gm IV daily, Reglan 5 mg IV Q 6 hrs PRN nausea  Imaging:  CLINICAL DATA:   Vertigo  EXAM: CT HEAD WITHOUT CONTRAST COMPARISON:  03/31/2009  FINDINGS: Brain: Ends level malacia secondary to remote left frontal infarct is present. Remote lacunar infarct noted within the left basal ganglia. Mild parenchymal volume loss is present, commensurate with the patient's age. Mild periventricular white matter changes are present likely reflecting the sequela of small vessel ischemia.  No evidence of acute intracranial hemorrhage or infarct. No abnormal mass effect or midline shift. No abnormal intra or extra-axial mass lesion or fluid collection. The ventricular size is normal. Cerebellum is unremarkable.  Vascular: No asymmetric hyperdense vasculature at the skull base.  Skull: Intact  Sinuses/Orbits: The visualized paranasal sinuses are clear. The visualized orbits are unremarkable.  Other: Visualized mastoid air cells and middle ear cavities are clear.  IMPRESSION: Remote infarcts. Senescent changes. No evidence of acute intracranial hemorrhage or infarct.  Hospital problem list:  Active Problems:   Atrial fibrillation with RVR (HCC)   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HCC)   Essential hypertension   Type 2 diabetes mellitus without complication, without long-term current use of insulin (HCC)   Morbid obesity with BMI of 45.0-49.9, adult (HCC)   Chronic diastolic CHF (congestive heart failure) (HCC)   Endometrial cancer (HCC)   COPD with acute bronchitis (HCC)   Vertigo   Dehydration   AKI (acute kidney injury) ([place])   69 y.o.femalewith medical history significant of Bladder cancer s/p transurethral resection of bladder tumorwith secondary malignant neoplasm of liver, stroke, congestive heart failure, atrial fibrillationnot on anticoagultion, diabetes, hypertension, chronic pain.Acquired thrombophilia endometrial  mass, morbid obesity frequent falls, COPD, indwelling catheter, mediastinal mass admitted for AKI, dehydration,  dizziness.  Vertigo- Will start her on meclizine. Did receive a dose. Appears sx starting to improve.  MRI brain pending to r/o mets as the cause  .Urothelial carcinoma of bladder(HCC) widely metastatic -oncology consulted. She is receiving hospice care with our care PT/OT- recommend SNF Oncology consulted  .Essential hypertension-continueIfblood pressure Allowschange Atenolol to Metoprolol given AKi  .Endometrial cancer [facility])- Oncology consulted  .Chronic diastolic CHF(congestive heart failure) (HCC) -- no in exacerbation.  Since on dry side, will hold home diuretic.  Monitor fluid status.    .Atrial fibrillation with RVR (HCC)-HR better. Continue with beta blk and cardizem .COPDwith hx/o bronchitis ([place]) - continue home meds.   .Dehydration- will gently rehydrate.  Marland KitchenAKI (acute kidney injury) ([place])-- likely secondary to dehydration Improving with IV fluids Avoid nephrotoxic medications Pending UTi - pt with indwelling catheter, unsure if true infection vs colonization We will continue with IV Rocephin Follow-up urine culture  Discharge plan: ongoing Goals of care DNR IDG: Updated Transfer summary and current medication list placed on hospital shadow chart. Will continue to follow and assist with discharge planning. Please call with any hospice needs or questions  Flo Shanks BSN, RN, [place] [phone]

## 2020-02-15 NOTE — TOC Initial Note (Signed)
Transition of Care (TOC) - Initial/Assessment Note    Patient Details  Name: Shaylin Pace Brecheen MRN: [id number] Date of Birth: 07/25/1950  Transition of Care (TOC) CM/SW Contact:    Virginia A Russoli, RN Phone Number: [phone] 02/15/2020, 2:46 PM  Clinical Narrative:                  Patient is current [facility] in home patient.  Spoke with [facility] and they have concerns about home and patient safety.    Met with patient in room today, she acknowledges feeling bad today.  She stated she has hospice and would like to continue with hospice.  Patient is also concerned about returning to her home.  She does not feel she can care for herself in the state she is in today.  We talked about Hospice vs SNF.  Today she states she feels bad and would revisit this.    She is not opposed to Long Term Care with Hospice, but states her medicaid does not pay residential care.  She has not looked to see if this medicaid can be changed.   Patient stated I could talk to her son, although she stated he has children, lives 30 minutes away and sometimes works out of state.  Today he is in [place].  Expected Discharge Plan: Home w Hospice Care Barriers to Discharge: Continued Medical Work up   Patient Goals and CMS Choice Patient states their goals for this hospitalization and ongoing recovery are:: To be in a safe place. CMS Medicare.gov Compare Post Acute Care list provided to:: Patient Choice offered to / list presented to : Patient  Expected Discharge Plan and Services Expected Discharge Plan: Home w Hospice Care In-house Referral: Clinical Social Work Discharge Planning Services: CM Consult Post Acute Care Choice: Nursing Home Living arrangements for the past 2 months: Single Family Home                   Prior Living Arrangements/Services Living arrangements for the past 2 months: Single Family Home Lives with:: Self Patient language and need for interpreter reviewed::  Yes Do you feel safe going back to the place where you live?: No   She doesn't feel she is able to care for herself, her son lives 30 minutes away, has children and is very busy, only able to assist some of the time.  Need for Family Participation in Patient Care: Yes (Comment) Care giver support system in place?: Yes (comment) Current home services: Hospice Criminal Activity/Legal Involvement Pertinent to Current Situation/Hospitalization: No - Comment as needed  Activities of Daily Living      Permission Sought/Granted Permission sought to share information with : Family Supports    Share Information with NAME: Joey Hergert      Emotional Assessment Appearance:: Appears stated age Attitude/Demeanor/Rapport: Engaged, Gracious Affect (typically observed): Accepting, Pleasant, Sad Orientation: : Oriented to Self, Oriented to Place, Oriented to  Time, Oriented to Situation Alcohol / Substance Use: Not Applicable Psych Involvement: No (comment)  Admission diagnosis:  Dehydration [E86.0] Patient Active Problem List   Diagnosis Date Noted  . COPD with acute bronchitis (HCC) 02/14/2020  . Vertigo 02/14/2020  . Dehydration 02/14/2020  . AKI (acute kidney injury) (HCC) 02/14/2020  . Palliative care encounter   . Hypokalemia 12/22/2019  . Endometrial cancer (HCC) 12/20/2019  . Goals of care, counseling/discussion 12/04/2019  . 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HCC)   . Weakness   . Liver metastases (HCC)   . Essential   hypertension   . Type 2 diabetes mellitus without complication, without long-term current use of insulin (HCC)   . Morbid obesity with BMI of 45.0-49.9, adult (HCC)   . Atrial fibrillation (HCC) 11/19/2019  . Hematuria, gross   . Postmenopausal bleeding   . Abnormal endometrial ultrasound   . Atrial fibrillation with RVR (HCC) 11/18/2019  . Bladder mass   . Fall   . Acute cystitis without hematuria   . Chronic diastolic CHF (congestive heart failure) (HCC)  05/27/2016  . Primary osteoarthritis of both knees 05/27/2016  . Progressive anemia 05/27/2016  . DNR (do not resuscitate)   . Palliative care by specialist   . Acute respiratory failure with hypoxia and hypercapnia (HCC)   . Atrial fibrillation with rapid ventricular response (HCC) 03/20/2016  . Chronic obstructive pulmonary disease with acute exacerbation (HCC)    PCP:  Miles, Linda M, MD Pharmacy:   [facility] - [place], [place] - [address] [place] [address] Phone: [phone] Fax: [phone]  [facility] - [place] - [address] [place] [address] Phone: [phone] Fax: [phone]  

## 2020-02-15 NOTE — Evaluation (Signed)
Physical Therapy Evaluation Patient Details Name: Brittney Shepard MRN: [id number] DOB: [birth year] Today's Date: 02/15/2020   History of Present Illness  69 y.o. female with medical history significant of Bladder cancer s/p transurethral resection of bladder tumor  with secondary malignant neoplasm of liver, stroke, congestive heart failure, atrial fibrillation not on anticoagultion, diabetes, hypertension, chronic pain.Acquired thrombophilia  endometrial mass, morbid obesity frequent falls, COPD, indwelling catheter, mediastinal mass.  Here with weakness, dizziness.  Clinical Impression  Pt struggled with most aspects of PT eval.  She repeatedly reports feeling very weak and needed extra encouragement and cuing to be able to do even the limited activity we were able to complete today.  She needed a lot of assist to even get trunk up to sitting at EOB, after <1 minute of sitting she started having some nausea and needed to lay back down (with heavy assist) and shortly started having some dry heaving.  Pt reports that she does have brief daily visits from an aide but has not been out of the home in a long time and does not really have much assist for grocery shopping, errands, etc.  Pt does not seem to have true vertigo or symptoms associated with BPPv, though formal testing was not possible with here functional and symptomatic issues. Discussed with pt about considering a transition to higher level of care if she struggles to make gains at rehab.      Follow Up Recommendations SNF    Equipment Recommendations  None recommended by PT    Recommendations for Other Services       Precautions / Restrictions Precautions Precautions: Fall Restrictions Weight Bearing Restrictions: No      Mobility  Bed Mobility Overal bed mobility: Needs Assistance Bed Mobility: Supine to Sit     Supine to sit: Max assist     General bed mobility comments: Pt struggled to even initiate movement toward  EOB, ultimately with plenty of cuing she did start the effort but did need a lot of assist to get to sitting  Transfers                 General transfer comment: Pt feeling weak and once sitting up nauseated.  Deferred attempts at standing once she started dry heaving.  HR up to ~130 during the effort, endorses significant fatigue as well.  Given her performance and energy up to the point standing likely would have been quite difficult  Ambulation/Gait                Stairs            Wheelchair Mobility    Modified Rankin (Stroke Patients Only)       Balance Overall balance assessment: Needs assistance Sitting-balance support: Bilateral upper extremity supported Sitting balance-Leahy Scale: Fair Sitting balance - Comments: Pt able to maintain sitting at EOB, but nauseated and dizzy                                     Pertinent Vitals/Pain Pain Assessment: Faces Faces Pain Scale: Hurts little more Pain Location: reports back pain from laying in bed    Home Living Family/patient expects to be discharged to:: Unsure                      Prior Function Level of Independence: Needs assistance   Gait / Transfers Assistance Needed:  (  ostensibly she gets around in the home with walker)  ADL's / Homemaking Assistance Needed: has caregiver briefly QD (?) for baths light house work        Hand Dominance        Extremity/Trunk Assessment   Upper Extremity Assessment Upper Extremity Assessment: Generalized weakness    Lower Extremity Assessment Lower Extremity Assessment: Generalized weakness (very limited AROM with hips/knees)       Communication   Communication: No difficulties  Cognition Arousal/Alertness: Awake/alert Behavior During Therapy: Restless Overall Cognitive Status: Within Functional Limits for tasks assessed                                        General Comments General comments (skin  integrity, edema, etc.): Pt able to assume positioning appropriate to check for BPPV today, however subjective discriptions of the "vertigo" along with lack of nystagmus and positioning during dizziness today lead this PT to belive dizziness is not BPPV related     Exercises     Assessment/Plan    PT Assessment Patient needs continued PT services  PT Problem List Decreased strength;Decreased range of motion;Decreased activity tolerance;Decreased balance;Decreased mobility;Decreased coordination;Decreased knowledge of use of DME;Decreased safety awareness;Cardiopulmonary status limiting activity       PT Treatment Interventions DME instruction;Stair training;Gait training;Functional mobility training;Therapeutic activities;Therapeutic exercise;Balance training;Neuromuscular re-education;Patient/family education    PT Goals (Current goals can be found in the Care Plan section)  Acute Rehab PT Goals Patient Stated Goal: get some rest PT Goal Formulation: With patient Time For Goal Achievement: 02/29/20 Potential to Achieve Goals: Fair    Frequency Min 2X/week   Barriers to discharge        Co-evaluation               AM-PAC PT "6 Clicks" Mobility  Outcome Measure Help needed turning from your back to your side while in a flat bed without using bedrails?: Total Help needed moving from lying on your back to sitting on the side of a flat bed without using bedrails?: Total Help needed moving to and from a bed to a chair (including a wheelchair)?: Total Help needed standing up from a chair using your arms (e.g., wheelchair or bedside chair)?: Total Help needed to walk in hospital room?: Total Help needed climbing 3-5 steps with a railing? : Total 6 Click Score: 6    End of Session Equipment Utilized During Treatment: Oxygen (high 90s on 2L on arrival, slowly dropped on room air) Activity Tolerance: Patient limited by fatigue;Other (comment) Patient left: in bed;with call  bell/phone within reach Nurse Communication: Mobility status (dry heaving) PT Visit Diagnosis: Muscle weakness (generalized) (M62.81);Difficulty in walking, not elsewhere classified (R26.2);Unsteadiness on feet (R26.81);Dizziness and giddiness (R42)    Time: 1448-1856 PT Time Calculation (min) (ACUTE ONLY): 28 min   Charges:   PT Evaluation $PT Eval Low Complexity: 1 Low          Kreg Shropshire, DPT 02/15/2020, 10:05 AM

## 2020-02-16 DIAGNOSIS — C799 Secondary malignant neoplasm of unspecified site: Secondary | ICD-10-CM | POA: Diagnosis not present

## 2020-02-16 DIAGNOSIS — E119 Type 2 diabetes mellitus without complications: Secondary | ICD-10-CM | POA: Diagnosis present

## 2020-02-16 DIAGNOSIS — Z66 Do not resuscitate: Secondary | ICD-10-CM | POA: Diagnosis present

## 2020-02-16 DIAGNOSIS — I639 Cerebral infarction, unspecified: Secondary | ICD-10-CM

## 2020-02-16 DIAGNOSIS — Z6841 Body Mass Index (BMI) 40.0 and over, adult: Secondary | ICD-10-CM | POA: Diagnosis not present

## 2020-02-16 DIAGNOSIS — R42 Dizziness and giddiness: Secondary | ICD-10-CM | POA: Diagnosis present

## 2020-02-16 DIAGNOSIS — N179 Acute kidney failure, unspecified: Secondary | ICD-10-CM | POA: Diagnosis present

## 2020-02-16 DIAGNOSIS — C541 Malignant neoplasm of endometrium: Secondary | ICD-10-CM | POA: Diagnosis present

## 2020-02-16 DIAGNOSIS — Z8673 Personal history of transient ischemic attack (TIA), and cerebral infarction without residual deficits: Secondary | ICD-10-CM | POA: Diagnosis not present

## 2020-02-16 DIAGNOSIS — Z7984 Long term (current) use of oral hypoglycemic drugs: Secondary | ICD-10-CM | POA: Diagnosis not present

## 2020-02-16 DIAGNOSIS — C679 Malignant neoplasm of bladder, unspecified: Secondary | ICD-10-CM | POA: Diagnosis present

## 2020-02-16 DIAGNOSIS — J209 Acute bronchitis, unspecified: Secondary | ICD-10-CM | POA: Diagnosis present

## 2020-02-16 DIAGNOSIS — Z20822 Contact with and (suspected) exposure to covid-19: Secondary | ICD-10-CM | POA: Diagnosis present

## 2020-02-16 DIAGNOSIS — D6859 Other primary thrombophilia: Secondary | ICD-10-CM | POA: Diagnosis present

## 2020-02-16 DIAGNOSIS — I63412 Cerebral infarction due to embolism of left middle cerebral artery: Secondary | ICD-10-CM | POA: Diagnosis present

## 2020-02-16 DIAGNOSIS — Z79899 Other long term (current) drug therapy: Secondary | ICD-10-CM | POA: Diagnosis not present

## 2020-02-16 DIAGNOSIS — E860 Dehydration: Secondary | ICD-10-CM | POA: Diagnosis present

## 2020-02-16 DIAGNOSIS — N390 Urinary tract infection, site not specified: Secondary | ICD-10-CM | POA: Diagnosis present

## 2020-02-16 DIAGNOSIS — C787 Secondary malignant neoplasm of liver and intrahepatic bile duct: Secondary | ICD-10-CM | POA: Diagnosis present

## 2020-02-16 DIAGNOSIS — I4821 Permanent atrial fibrillation: Secondary | ICD-10-CM | POA: Diagnosis present

## 2020-02-16 DIAGNOSIS — Z515 Encounter for palliative care: Secondary | ICD-10-CM | POA: Diagnosis not present

## 2020-02-16 DIAGNOSIS — I5032 Chronic diastolic (congestive) heart failure: Secondary | ICD-10-CM | POA: Diagnosis not present

## 2020-02-16 DIAGNOSIS — J440 Chronic obstructive pulmonary disease with acute lower respiratory infection: Secondary | ICD-10-CM | POA: Diagnosis present

## 2020-02-16 DIAGNOSIS — I110 Hypertensive heart disease with heart failure: Secondary | ICD-10-CM | POA: Diagnosis present

## 2020-02-16 DIAGNOSIS — I5033 Acute on chronic diastolic (congestive) heart failure: Secondary | ICD-10-CM | POA: Diagnosis not present

## 2020-02-16 DIAGNOSIS — R112 Nausea with vomiting, unspecified: Secondary | ICD-10-CM | POA: Diagnosis not present

## 2020-02-16 DIAGNOSIS — I4891 Unspecified atrial fibrillation: Secondary | ICD-10-CM | POA: Diagnosis not present

## 2020-02-16 DIAGNOSIS — R296 Repeated falls: Secondary | ICD-10-CM | POA: Diagnosis present

## 2020-02-16 DIAGNOSIS — Z9221 Personal history of antineoplastic chemotherapy: Secondary | ICD-10-CM | POA: Diagnosis not present

## 2020-02-16 LAB — GLUCOSE, CAPILLARY
Glucose-Capillary: 100 mg/dL — ABNORMAL HIGH (ref 70–99)
Glucose-Capillary: 103 mg/dL — ABNORMAL HIGH (ref 70–99)
Glucose-Capillary: 110 mg/dL — ABNORMAL HIGH (ref 70–99)
Glucose-Capillary: 134 mg/dL — ABNORMAL HIGH (ref 70–99)
Glucose-Capillary: 168 mg/dL — ABNORMAL HIGH (ref 70–99)
Glucose-Capillary: 89 mg/dL (ref 70–99)

## 2020-02-16 LAB — BASIC METABOLIC PANEL
Anion gap: 7 (ref 5–15)
BUN: 16 mg/dL (ref 8–23)
CO2: 26 mmol/L (ref 22–32)
Calcium: 8 mg/dL — ABNORMAL LOW (ref 8.9–10.3)
Chloride: 108 mmol/L (ref 98–111)
Creatinine, Ser: 1.1 mg/dL — ABNORMAL HIGH (ref 0.44–1.00)
GFR, Estimated: 50 mL/min — ABNORMAL LOW (ref >60)
Glucose, Bld: 101 mg/dL — ABNORMAL HIGH (ref 70–99)
Potassium: 4.1 mmol/L (ref 3.5–5.1)
Sodium: 141 mmol/L (ref 135–145)

## 2020-02-16 LAB — HEMOGLOBIN A1C
Hgb A1c MFr Bld: 6 % — ABNORMAL HIGH (ref 4.8–5.6)
Mean Plasma Glucose: 126 mg/dL

## 2020-02-16 LAB — LIPID PANEL
Cholesterol: 144 mg/dL (ref 0–200)
HDL: 22 mg/dL — ABNORMAL LOW (ref >40)
LDL Cholesterol: 90 mg/dL (ref 0–99)
Total CHOL/HDL Ratio: 6.5 RATIO
Triglycerides: 160 mg/dL — ABNORMAL HIGH (ref <150)
VLDL: 32 mg/dL (ref 0–40)

## 2020-02-16 LAB — URINE CULTURE

## 2020-02-16 MED ORDER — APIXABAN 5 MG PO TABS
5.0000 mg | ORAL_TABLET | Freq: Two times a day (BID) | ORAL | Status: DC
  Administered 2020-02-16 – 2020-02-22 (×13): 5 mg via ORAL
  Filled 2020-02-16 (×13): qty 1

## 2020-02-16 NOTE — Progress Notes (Addendum)
[place] ([place] patient RN note:  Patient is a current hospice patient with a terminal diagnosis of bladder cancer with metastases to liver. She presented to the ED 10.13 for weakness and dizziness. Hospice was not notified prior to transport. Patient is a DNR and this is a related admission.  Visited with patient at bedside. She was sitting up in bed and appeared comfortable. She is alert but has some mild confusion and aphasia but able to follow commands. She denies any pain or numbness or tingling but still complains of dizziness with movement and states she does not feel like eating much either. We discussed the findings of the MRI which showed acute infarcts. Plan is start anticoagulation therapy and do carotid dopplers. PT, OT and Speech consult ordered.  Vital Signs: BP 108/58, HR 93; Resp 16; Temp 97.6; O2 sat 95% on 2 L [place]  I & O: 116ml IV & 836ml urine output  Abnormal labs: Glucose: 101 (H) Creatinine: 1.10 (H) Calcium: 8.0 (L) GFR, Estimated: 50 (L) HDL Cholesterol: 22 (L) Triglycerides: 160 (H)  Diagnostics: MRI BRAIN IMPRESSION: Mildly motion degraded examination.  No evidence of intracranial metastatic disease.  Patchy acute infarcts within the right cerebellar hemisphere (PICA vascular territory) encompassing 4.6 x 1.9 cm in transaxial dimensions.  13 mm acute/early subacute infarct within the left frontoparietal subcortical white matter.  Redemonstrated chronic left MCA territory cortical/subcortical infarct within the left frontal lobe, left insula/subinsular white matter and deep left frontal white matter.  Background mild generalized cerebral atrophy and moderate cerebral white matter chronic small vessel ischemic disease.  Remote blood products within the left midbrain indicative of prior hemorrhage at this site.  IV/PRN Meds: cefTRIAXone (ROCEPHIN) 1 g in sodium chloride 0.9 % 100  mL IVPB Dose: 1 g Freq: Every 24 hours Route: IV  Problem List: Active Problems:   Atrial fibrillation with RVR (HCC)   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HCC)   Essential hypertension   Type 2 diabetes mellitus without complication, without long-term current use of insulin (HCC)   Morbid obesity with BMI of 45.0-49.9, adult (HCC)   Chronic diastolic CHF (congestive heart failure) (HCC)   Endometrial cancer (HCC)   COPD with acute bronchitis (HCC)   Vertigo   Dehydration   AKI (acute kidney injury) ([place])  69 y.o.femalewith medical history significant of Bladder cancer s/p transurethral resection of bladder tumorwith secondary malignant neoplasm of liver, stroke, congestive heart failure, atrial fibrillationnot on anticoagultion, diabetes, hypertension, chronic pain.Acquired thrombophilia endometrial mass, morbid obesity frequent falls, COPD, indwelling catheter, mediastinal mass admitted for AKI, dehydration, dizziness.  Vertigo- Will start her on meclizine. Did receive a dose. Appears sx starting to improve.  MRI brain pending to r/o mets as the cause  .Urothelial carcinoma of bladder(HCC) widely metastatic -oncology consulted. She is receiving hospice care with our care PT/OT- recommend SNF Oncology consulted  .Essential hypertension-continueIfblood pressure Allowschange Atenolol to Metoprolol given AKi  .Endometrial cancer [facility])- Oncology consulted  .Chronic diastolic CHF(congestive heart failure) (HCC) -- no in exacerbation.  Since on dry side, will hold home diuretic.  Monitor fluid status.   .Atrial fibrillation with RVR (HCC)-HR better. Continue with beta blk and cardizem  .COPDwith hx/o bronchitis ([place]) - continue home meds.   .Dehydration- will gently rehydrate.  Marland KitchenAKI (acute kidney injury) ([place])-- likely secondary to dehydration Improving with IV fluids Avoid nephrotoxic medications Pending  UTi - pt with indwelling  catheter, unsure if true infection vs colonization We will  continue with IV Rocephin Follow-up urine culture  Discharge Planning: Ongoing  Family contact: Spoke with son, Joey by phone.  IDG: Updated  Goals of Care: Ongoing  Please call with any hospice related questions or concerns.  Zandra Abts, RN [facility] Liaison [phone]

## 2020-02-16 NOTE — Progress Notes (Signed)
Physical Therapy Treatment Patient Details Name: Brittney Shepard MRN: [id number] DOB: 26-Jun-1950 Today's Date: 02/16/2020    History of Present Illness 69 y.o. female with medical history significant of Bladder cancer s/p transurethral resection of bladder tumor  with secondary malignant neoplasm of liver, stroke, congestive heart failure, atrial fibrillation not on anticoagultion, diabetes, hypertension, chronic pain.Acquired thrombophilia  endometrial mass, morbid obesity frequent falls, COPD, indwelling catheter, mediastinal mass.  Here with weakness, dizziness.    PT Comments    Pt continues to have some limited confusion but was able to tolerate exercises and some mobility reasonably well, though she did need repeated cuing and encouragement to actually participate.  She showed inconsistency with exercises at times following instructions well and showing consistent quality of motion and at times needing heavy encouragement/reinforcement even between single reps. This was inconsistent and did not seem associated more with one side vs the other.  Pt initially refused to try getting up to sitting, however when cued to try later she quickly showed some effort in getting to EOB; though ultimately she did need heavy assist to attain sitting.  Attempts at standing were all but futile even with heavy cuing and heavy physical assist.  At time of PT exam CT showed no acute infarct, subsequent MRI did reveal acute CVA so frequency will be increased to QD as pt is appropriate.   Follow Up Recommendations  SNF     Equipment Recommendations  None recommended by PT    Recommendations for Other Services       Precautions / Restrictions Precautions Precautions: Fall Restrictions Weight Bearing Restrictions: No    Mobility  Bed Mobility Overal bed mobility: Needs Assistance Bed Mobility: Supine to Sit;Sit to Supine     Supine to sit: Max assist Sit to supine: Max assist   General bed  mobility comments: Pt showed some minimal effort, but ultimately needed very heavy assist with getting to EOB, reports some nausea/dizziness but able to maintain sitting balance with only CGA once up  Transfers Overall transfer level: Needs assistance Equipment used: Rolling walker (2 wheeled) Transfers: Sit to/from Stand Sit to Stand: Total assist         General transfer comment: attempted to get to standing with very limited success.  Even with heavy assist she was unable to get buttocks off bed    Ambulation/Gait                 Stairs             Wheelchair Mobility    Modified Rankin (Stroke Patients Only)       Balance Overall balance assessment: Needs assistance Sitting-balance support: Bilateral upper extremity supported Sitting balance-Leahy Scale: Fair       Standing balance-Leahy Scale: Zero                              Cognition Arousal/Alertness: Awake/alert Behavior During Therapy: Anxious Overall Cognitive Status: No family/caregiver present to determine baseline cognitive functioning                                 General Comments: Pt able to follow basic commands, but struggled with staying on task/consistency      Exercises General Exercises - Lower Extremity Ankle Circles/Pumps: AROM;10 reps;Strengthening Short Arc Quad: Strengthening;10 reps Heel Slides: 10 reps;AAROM (with resisted leg extensions) Hip ABduction/ADduction: AROM;10  reps;Strengthening    General Comments        Pertinent Vitals/Pain Pain Assessment: No/denies pain Pain Location: reports minimal pain in LEs intermittently/inconsistently    Home Living                      Prior Function            PT Goals (current goals can now be found in the care plan section) Progress towards PT goals: Progressing toward goals    Frequency    7X/week      PT Plan Frequency needs to be updated    Co-evaluation               AM-PAC PT "6 Clicks" Mobility   Outcome Measure  Help needed turning from your back to your side while in a flat bed without using bedrails?: A Lot Help needed moving from lying on your back to sitting on the side of a flat bed without using bedrails?: A Lot Help needed moving to and from a bed to a chair (including a wheelchair)?: Total Help needed standing up from a chair using your arms (e.g., wheelchair or bedside chair)?: Total Help needed to walk in hospital room?: Total Help needed climbing 3-5 steps with a railing? : Total 6 Click Score: 8    End of Session Equipment Utilized During Treatment: Gait belt;Oxygen (O2 off on arrival low 90s, reapplied for activity) Activity Tolerance: Patient limited by fatigue Patient left: in bed;with call bell/phone within reach Nurse Communication: Mobility status PT Visit Diagnosis: Muscle weakness (generalized) (M62.81);Difficulty in walking, not elsewhere classified (R26.2);Unsteadiness on feet (R26.81);Dizziness and giddiness (R42)     Time: 3838-1840 PT Time Calculation (min) (ACUTE ONLY): 40 min  Charges:  $Therapeutic Exercise: 23-37 mins $Therapeutic Activity: 8-22 mins                     Kreg Shropshire, DPT 02/16/2020, 1:53 PM

## 2020-02-16 NOTE — NC FL2 (Signed)
[place] LEVEL OF CARE SCREENING TOOL     IDENTIFICATION  Patient Name: Brittney Shepard Birthdate: 1951/03/16 Sex: female Admission Date (Current Location): 02/14/2020  [place] and [place] Number:  Engineering geologist and Address:  [facility], [address], [place], [place] [address]      Provider Number: [id number]  Attending Physician Name and Address:  Nolberto Hanlon, MD  Relative Name and Phone Number:  Abbigail Anstey [phone]    Current Level of Care: SNF Recommended Level of Care: [place] Prior Approval Number:    Date Approved/Denied:   PASRR Number: [id number]  Discharge Plan: SNF    Current Diagnoses: Patient Active Problem List   Diagnosis Date Noted  . Cerebellar stroke ([place])   . Metastatic carcinoma ([place])   . COPD with acute bronchitis ([place]) 02/14/2020  . Vertigo 02/14/2020  . Dehydration 02/14/2020  . AKI (acute kidney injury) ([place]) 02/14/2020  . Palliative care encounter   . Hypokalemia 12/22/2019  . Endometrial cancer ([place]) 12/20/2019  . Goals of care, counseling/discussion 12/04/2019  . 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[place])   . Weakness   . Liver metastases ([place])   . Essential hypertension   . Type 2 diabetes mellitus without complication, without long-term current use of insulin ([place])   . Morbid obesity with BMI of 45.0-49.9, adult ([place])   . Atrial fibrillation ([place]) 11/19/2019  . Hematuria, gross   . Postmenopausal bleeding   . Abnormal endometrial ultrasound   . Atrial fibrillation with RVR ([place]) 11/18/2019  . Bladder mass   . Fall   . Acute cystitis without hematuria   . Chronic diastolic CHF (congestive heart failure) ([place]) 05/27/2016  . Primary osteoarthritis of both knees 05/27/2016  . Progressive anemia 05/27/2016  . DNR (do not resuscitate)   . Palliative care by specialist   . Acute respiratory failure with hypoxia and hypercapnia ([place])   . Atrial fibrillation with  rapid ventricular response ([place]) 03/20/2016  . Chronic obstructive pulmonary disease with acute exacerbation (HCC)     Orientation RESPIRATION BLADDER Height & Weight     Self, Time, Situation, Place  Normal Incontinent Weight: 118.3 kg Height:  5' (152.4 cm)  BEHAVIORAL SYMPTOMS/MOOD NEUROLOGICAL BOWEL NUTRITION STATUS      Continent Diet (Carb Modified)  AMBULATORY STATUS COMMUNICATION OF NEEDS Skin   Extensive Assist Verbally Normal                       Personal Care Assistance Level of Assistance  Total care       Total Care Assistance: Maximum assistance   Functional Limitations Info  Sight, Hearing, Speech Sight Info: Adequate Hearing Info: Adequate Speech Info: Adequate    SPECIAL CARE FACTORS FREQUENCY  PT (By licensed PT), OT (By licensed OT)                    Contractures Contractures Info: Not present    Additional Factors Info  Code Status, Allergies Code Status Info: DNR Allergies Info: PCN, Sulfa           Current Medications (02/16/2020):  This is the current hospital active medication list Current Facility-Administered Medications  Medication Dose Route Frequency Provider Last Rate Last Admin  . 0.9 %  sodium chloride infusion   Intravenous PRN Nolberto Hanlon, MD   Stopped at 02/15/20 2200  . acetaminophen (TYLENOL) tablet 650 mg  650 mg Oral Q6H PRN Toy Baker, MD  Or  . acetaminophen (TYLENOL) suppository 650 mg  650 mg Rectal Q6H PRN Doutova, Anastassia, MD      . albuterol (PROVENTIL) (2.5 MG/3ML) 0.083% nebulizer solution 2.5 mg  2.5 mg Nebulization Q2H PRN Doutova, Anastassia, MD      . apixaban (ELIQUIS) tablet 5 mg  5 mg Oral BID Nolberto Hanlon, MD   5 mg at 02/16/20 0904  . cefTRIAXone (ROCEPHIN) 1 g in sodium chloride 0.9 % 100 mL IVPB  1 g Intravenous Q24H Toy Baker, MD   Stopped at 02/15/20 2042  . Chlorhexidine Gluconate Cloth 2 % PADS 6 each  6 each Topical Daily Nolberto Hanlon, MD   6 each at 02/15/20  2013  . darifenacin (ENABLEX) 24 hr tablet 7.5 mg  7.5 mg Oral Daily Doutova, Anastassia, MD   7.5 mg at 02/16/20 0904  . diltiazem (CARDIZEM SR) 12 hr capsule 60 mg  60 mg Oral BID Toy Baker, MD   60 mg at 02/16/20 0904  . influenza vaccine adjuvanted (FLUAD) injection 0.5 mL  0.5 mL Intramuscular Tomorrow-1000 Kurtis Bushman, Sahar, MD      . insulin aspart (novoLOG) injection 0-9 Units  0-9 Units Subcutaneous Q4H Toy Baker, MD   2 Units at 02/16/20 1241  . ipratropium-albuterol (DUONEB) 0.5-2.5 (3) MG/3ML nebulizer solution 3 mL  3 mL Nebulization TID Toy Baker, MD   3 mL at 02/16/20 1345  . meclizine (ANTIVERT) tablet 12.5 mg  12.5 mg Oral BID Nolberto Hanlon, MD   12.5 mg at 02/16/20 0904  . metoCLOPramide (REGLAN) injection 5 mg  5 mg Intravenous Q6H PRN Doutova, Anastassia, MD      . metoprolol tartrate (LOPRESSOR) tablet 12.5 mg  12.5 mg Oral BID Toy Baker, MD   12.5 mg at 02/16/20 7544     Discharge Medications: Please see discharge summary for a list of discharge medications.  Relevant Imaging Results:  Relevant Lab Results:   Additional Information [id number]  Shelbie Ammons, RN

## 2020-02-16 NOTE — Consult Note (Signed)
Requesting Physician: Kurtis Bushman    Chief Complaint: Dizziness  I have been asked by Dr. Kurtis Bushman to see this patient in consultation for acute infarct.  HPI: Brittney Shepard is an 69 y.o. female with medical history significant of bladder cancer s/p transurethral resection of bladder tumorwith secondary malignant neoplasm of liver, stroke, congestive heart failure, atrial fibrillation not on anticoagulation due to previous hematuria, diabetes, hypertension, chronic pain, acquired thrombophilia endometrial mass, morbid obesity, frequent falls who presented with complaints of dizziness that was noted prior to going to bed on 10/12. Patient reports no numbness or weakness.  Initial NIHSS of 0.  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    Secondary malignant neoplasm of liver ([place])    Stroke ([place]) 2013   Urinary tract infection, site not specified     Past Surgical History:  Procedure Laterality Date   CYSTOSCOPY W/ RETROGRADES N/A 11/20/2019   Procedure: CYSTOSCOPY WITH RETROGRADE PYELOGRAM;  Surgeon: Billey Co, MD;  Location: [facility] ORS;  Service: Urology;  Laterality: N/A;   CYSTOSCOPY WITH STENT PLACEMENT Left 11/20/2019   Procedure: CYSTOSCOPY WITH STENT PLACEMENT;  Surgeon: Billey Co, MD;  Location: [facility] ORS;  Service: Urology;  Laterality: Left;   TRANSURETHRAL RESECTION OF BLADDER TUMOR N/A 11/20/2019   Procedure: TRANSURETHRAL RESECTION OF BLADDER TUMOR (TURBT);  Surgeon: Billey Co, MD;  Location: [facility] ORS;  Service: Urology;  Laterality: N/A;    Family  History  Problem Relation Age of Onset   Ovarian cancer Mother    Cancer Sister    Social History:  reports that she has never smoked. She has never used smokeless tobacco. She reports that she does not drink alcohol and does not use drugs.  Allergies:  Allergies  Allergen Reactions   Penicillin G Hives   Sulfa Antibiotics Rash    Medications:  I have reviewed the patient's current medications. Prior to Admission:  Medications Prior to Admission  Medication Sig Dispense Refill Last Dose   acetaminophen (TYLENOL) 325 MG tablet Take 2 tablets (650 mg total) by mouth every 6 (six) hours as needed for mild pain (or Fever >/= 101). 60 tablet 0 prn at prn   diltiazem (CARDIZEM SR) 60 MG 12 hr capsule Take 1 capsule (60 mg total) by mouth every 12 (twelve) hours. (Patient taking differently: Take 60 mg by mouth 2 (two) times daily. ) 60 capsule 0 Unknown at Unknown   furosemide (LASIX) 40 MG tablet Take 1 tablet (40 mg total) by mouth 2 (two) times daily. 30 tablet 0 Unknown at Unknown   metFORMIN (GLUCOPHAGE) 500 MG tablet Take 500 mg by mouth daily with breakfast.    Unknown at Unknown   potassium chloride (KLOR-CON) 10 MEQ tablet Take 10 mEq by mouth 2 (two) times daily.   Unknown at Unknown   VESICARE 10 MG tablet Take 10 mg by mouth daily.   Unknown at Unknown   apixaban (ELIQUIS) 2.5 MG TABS tablet Take 1 tablet (2.5 mg total) by mouth 2 (two) times daily. (Patient not  taking: Reported on 01/15/2020) 60 tablet 1 Not Taking   atenolol (TENORMIN) 25 MG tablet Take 1 tablet (25 mg total) by mouth daily. (Patient not taking: Reported on 02/15/2020) 30 tablet 1 Not Taking   ipratropium-albuterol (DUONEB) 0.5-2.5 (3) MG/3ML SOLN Take 3 mLs by nebulization 3 (three) times daily. (Patient not taking: Reported on 01/15/2020) 360 mL 0 Not Taking   Multiple Vitamin (MULTIVITAMIN) tablet Take 1 tablet by mouth daily. (Patient not taking: Reported on 01/15/2020)   Not Taking   polyethylene  glycol (MIRALAX / GLYCOLAX) 17 g packet Take 17 g by mouth daily as needed for mild constipation. (Patient not taking: Reported on 01/15/2020) 14 each 0 Not Taking   senna-docusate (SENOKOT-S) 8.6-50 MG tablet Take 1 tablet by mouth 2 (two) times daily. (Patient not taking: Reported on 01/15/2020) 60 tablet 0 Not Taking   Scheduled:  apixaban  5 mg Oral BID   Chlorhexidine Gluconate Cloth  6 each Topical Daily   darifenacin  7.5 mg Oral Daily   diltiazem  60 mg Oral BID   influenza vaccine adjuvanted  0.5 mL Intramuscular Tomorrow-1000   insulin aspart  0-9 Units Subcutaneous Q4H   ipratropium-albuterol  3 mL Nebulization TID   meclizine  12.5 mg Oral BID   metoprolol tartrate  12.5 mg Oral BID    ROS: History obtained from the patient  General ROS: negative for - chills, fatigue, fever, night sweats, weight gain or weight loss Psychological ROS: negative for - behavioral disorder, hallucinations, memory difficulties, mood swings or suicidal ideation Ophthalmic ROS: negative for - blurry vision, double vision, eye pain or loss of vision ENT ROS: negative for - epistaxis, nasal discharge, oral lesions, sore throat, tinnitus or vertigo Allergy and Immunology ROS: negative for - hives or itchy/watery eyes Hematological and Lymphatic ROS: negative for - bleeding problems, bruising or swollen lymph nodes Endocrine ROS: negative for - galactorrhea, hair pattern changes, polydipsia/polyuria or temperature intolerance Respiratory ROS: negative for - cough, hemoptysis, shortness of breath or wheezing Cardiovascular ROS: lower extremity edema Gastrointestinal ROS: negative for - abdominal pain, diarrhea, hematemesis, nausea/vomiting or stool incontinence Genito-Urinary ROS: negative for - dysuria, hematuria, incontinence or urinary frequency/urgency Musculoskeletal ROS: negative for - joint swelling or muscular weakness Neurological ROS: as noted in HPI Dermatological ROS: negative for  rash and skin lesion changes  Physical Examination: Blood pressure (!) 108/58, pulse 93, temperature 97.6 F (36.4 C), temperature source Oral, resp. rate 16, height 5' (1.524 m), weight 118.3 kg, SpO2 95 %.  HEENT-  Normocephalic, no lesions, without obvious abnormality.  Normal external eye and conjunctiva.  Normal TM's bilaterally.  Normal auditory canals and external ears. Normal external nose, mucus membranes and septum.  Normal pharynx. Cardiovascular- S1, S2 normal, pulses palpable throughout   Lungs- chest clear, no wheezing, rales, normal symmetric air entry Abdomen- soft, non-tender; bowel sounds normal; no masses,  no organomegaly Extremities- mild BLE edema Lymph-no adenopathy palpable Musculoskeletal-no joint tenderness, deformity or swelling Skin-warm and dry, no hyperpigmentation, vitiligo, or suspicious lesions  Neurological Examination   Mental Status: Alert, some mild confusion noted.  Speech fluent without evidence of aphasia.  Able to follow 3 step commands without difficulty. Cranial Nerves: II: Visual fields grossly normal, pupils equal, round, reactive to light and accommodation III,IV, VI: ptosis not present, extra-ocular motions intact bilaterally V,VII: smile symmetric, facial light touch sensation normal bilaterally VIII: hearing normal bilaterally IX,X: gag reflex present XI: bilateral shoulder shrug XII: midline tongue extension Motor: Right : Upper  extremity   5-/5    Left:     Upper extremity   5/5  Lower extremity   4-/5     Lower extremity   4-/5 Tone and bulk:normal tone throughout; no atrophy noted Sensory: Pinprick and light touch intact throughout, bilaterally Deep Tendon Reflexes: Symmetric throughout Plantars: Right: mute   Left: mute Cerebellar: Normal finger-to-nose testing bilaterally Gait: not tested due to safety concerns   Laboratory Studies:  Basic Metabolic Panel: Recent Labs  Lab 02/14/20 1949 02/15/20 0501 02/16/20 0458   NA 135 137 141  K 4.7 4.6 4.1  CL 102 106 108  CO2 20* 22 26  GLUCOSE 209* 152* 101*  BUN 21 20 16   CREATININE 1.54* 1.31* 1.10*  CALCIUM 8.0* 7.6* 8.0*  MG  --  2.0  --   PHOS  --  3.8  --     Liver Function Tests: Recent Labs  Lab 02/15/20 0501  AST 13*  ALT 7  ALKPHOS 64  BILITOT 1.0  PROT 6.5  ALBUMIN 2.8*   No results for input(s): LIPASE, AMYLASE in the last 168 hours. No results for input(s): AMMONIA in the last 168 hours.  CBC: Recent Labs  Lab 02/14/20 1949 02/15/20 0501  WBC 16.0* 10.7*  NEUTROABS  --  8.9*  HGB 12.3 11.2*  HCT 39.6 35.2*  MCV 86.5 87.6  PLT 430* 326    Cardiac Enzymes: No results for input(s): CKTOTAL, CKMB, CKMBINDEX, TROPONINI in the last 168 hours.  BNP: Invalid input(s): POCBNP  CBG: Recent Labs  Lab 02/15/20 1616 02/15/20 1958 02/16/20 0011 02/16/20 0432 02/16/20 0815  GLUCAP 120* 116* 100* 40 103*    Microbiology: Results for orders placed or performed during the hospital encounter of 02/14/20  Urine culture     Status: Abnormal   Collection Time: 02/14/20  7:49 PM   Specimen: Urine, Random  Result Value Ref Range Status   Specimen Description   Final    URINE, RANDOM Performed at [facility], [address], [place], [place] [address]    Special Requests   Final    NONE Performed at [facility], [address] [place], [place] [address]    Culture MULTIPLE SPECIES PRESENT, SUGGEST RECOLLECTION (A)  Final   Report Status 02/16/2020 FINAL  Final  Respiratory Panel by RT PCR (Flu A&B, Covid) - Nasopharyngeal Swab     Status: None   Collection Time: 02/14/20  9:45 PM   Specimen: Nasopharyngeal Swab  Result Value Ref Range Status   SARS Coronavirus 2 by RT PCR NEGATIVE NEGATIVE Final    Comment: (NOTE) SARS-CoV-2 target nucleic acids are NOT DETECTED.  The SARS-CoV-2 RNA is generally detectable in upper respiratoy specimens during the acute phase of infection. The  lowest concentration of SARS-CoV-2 viral copies this assay can detect is 131 copies/mL. A negative result does not preclude SARS-Cov-2 infection and should not be used as the sole basis for treatment or other patient management decisions. A negative result may occur with  improper specimen collection/handling, submission of specimen other than nasopharyngeal swab, presence of viral mutation(s) within the areas targeted by this assay, and inadequate number of viral copies (<131 copies/mL). A negative result must be combined with clinical observations, patient history, and epidemiological information. The expected result is Negative.  Fact Sheet for Patients:  PinkCheek.be  Fact Sheet for Healthcare Providers:  GravelBags.it  This test is no t yet approved or cleared by the [place] FDA and  has  been authorized for detection and/or diagnosis of SARS-CoV-2 by FDA under an Emergency Use Authorization (EUA). This EUA will remain  in effect (meaning this test can be used) for the duration of the COVID-19 declaration under Section 564(b)(1) of the Act, 21 U.S.C. section 360bbb-3(b)(1), unless the authorization is terminated or revoked sooner.     Influenza A by PCR NEGATIVE NEGATIVE Final   Influenza B by PCR NEGATIVE NEGATIVE Final    Comment: (NOTE) The Xpert Xpress SARS-CoV-2/FLU/RSV assay is intended as an aid in  the diagnosis of influenza from Nasopharyngeal swab specimens and  should not be used as a sole basis for treatment. Nasal washings and  aspirates are unacceptable for Xpert Xpress SARS-CoV-2/FLU/RSV  testing.  Fact Sheet for Patients: PinkCheek.be  Fact Sheet for Healthcare Providers: GravelBags.it  This test is not yet approved or cleared by the [place] FDA and  has been authorized for detection and/or diagnosis of SARS-CoV-2 by  FDA under  an Emergency Use Authorization (EUA). This EUA will remain  in effect (meaning this test can be used) for the duration of the  Covid-19 declaration under Section 564(b)(1) of the Act, 21  U.S.C. section 360bbb-3(b)(1), unless the authorization is  terminated or revoked. Performed at [facility], [address] [place], [place] [address]     Coagulation Studies: No results for input(s): LABPROT, INR in the last 72 hours.  Urinalysis:  Recent Labs  Lab 02/14/20 1949  COLORURINE AMBER*  LABSPEC 1.012  PHURINE 7.0  GLUCOSEU NEGATIVE  HGBUR MODERATE*  BILIRUBINUR NEGATIVE  KETONESUR NEGATIVE  PROTEINUR 100*  NITRITE POSITIVE*  LEUKOCYTESUR LARGE*    Lipid Panel:    Component Value Date/Time   CHOL 120 03/22/2016 0601   TRIG 102 03/22/2016 0601   HDL 29 (L) 03/22/2016 0601   CHOLHDL 4.1 03/22/2016 0601   VLDL 20 03/22/2016 0601   LDLCALC 71 03/22/2016 0601    HgbA1C:  Lab Results  Component Value Date   HGBA1C 6.0 (H) 02/15/2020    Urine Drug Screen:  No results found for: LABOPIA, COCAINSCRNUR, LABBENZ, AMPHETMU, THCU, LABBARB  Alcohol Level: No results for input(s): ETH in the last 168 hours.  Other results: EKG: atrial fibrillation, rate 129bpm.  Imaging: CT Head Wo Contrast  Result Date: 02/14/2020 CLINICAL DATA:  Vertigo EXAM: CT HEAD WITHOUT CONTRAST TECHNIQUE: Contiguous axial images were obtained from the base of the skull through the vertex without intravenous contrast. COMPARISON:  03/31/2009 FINDINGS: Brain: Ends level malacia secondary to remote left frontal infarct is present. Remote lacunar infarct noted within the left basal ganglia. Mild parenchymal volume loss is present, commensurate with the patient's age. Mild periventricular white matter changes are present likely reflecting the sequela of small vessel ischemia. No evidence of acute intracranial hemorrhage or infarct. No abnormal mass effect or midline shift. No abnormal intra or  extra-axial mass lesion or fluid collection. The ventricular size is normal. Cerebellum is unremarkable. Vascular: No asymmetric hyperdense vasculature at the skull base. Skull: Intact Sinuses/Orbits: The visualized paranasal sinuses are clear. The visualized orbits are unremarkable. Other: Visualized mastoid air cells and middle ear cavities are clear. IMPRESSION: Remote infarcts. Senescent changes. No evidence of acute intracranial hemorrhage or infarct. Electronically Signed   By: Fidela Salisbury MD   On: 02/14/2020 22:11   MR BRAIN W WO CONTRAST  Addendum Date: 02/15/2020   ADDENDUM REPORT: 02/15/2020 17:00 ADDENDUM: These results were called by telephone at the time of interpretation on 02/15/2020 at 5:00 pm  to provider Dr. Nolberto Hanlon, who verbally acknowledged these results. Electronically Signed   By: Kellie Simmering DO   On: 02/15/2020 17:00   Result Date: 02/15/2020 CLINICAL DATA:  Metastatic disease evaluation. EXAM: MRI HEAD WITHOUT AND WITH CONTRAST TECHNIQUE: Multiplanar, multiecho pulse sequences of the brain and surrounding structures were obtained without and with intravenous contrast. CONTRAST:  7.74mL GADAVIST GADOBUTROL 1 MMOL/ML IV SOLN COMPARISON:  Noncontrast head CT 02/14/2020. FINDINGS: Brain: Mild intermittent motion degradation. This includes mild motion degradation of the axial and coronal postcontrast T1 weighted sequences. Mild generalized cerebral atrophy. No abnormal enhancement is demonstrated to suggest intracranial metastatic disease. There is patchy restricted diffusion within the inferomedial right cerebellar hemisphere and right cerebellar tonsil (PICA vascular territory) consistent with acute infarction. This encompasses a region measuring 4.6 x 1.9 cm in transaxial dimensions (series 5, image 11). Corresponding T2/FLAIR hyperintensity at this site. Additional 13 mm focus of diffusion weighted hyperintensity and corresponding intermediate signal on the ADC map consistent  with acute/early subacute infarction (series 5, image 34) (series 7, image 15). Redemonstrated chronic left MCA territory cortical/subcortical infarct within the frontal lobe, left insula/subinsular white matter and deep left frontal white matter. Mild chronic blood products are present at this site. Chronic blood products are also present within the left midbrain likely reflecting sequela of prior hemorrhage. Moderate multifocal T2/FLAIR hyperintensity within the cerebral white matter is nonspecific, but consistent with chronic small vessel ischemic disease. No extra-axial fluid collection. No midline shift. Incidentally noted pineal gland cyst without suspicious masslike or nodular enhancement. Vascular: Expected proximal arterial flow voids. Skull and upper cervical spine: No focal suspicious marrow lesion. Sinuses/Orbits: Visualized orbits show no acute finding. Mild ethmoid sinus mucosal thickening. Small right maxillary sinus mucous retention cyst. Trace fluid within bilateral mastoid air cells. IMPRESSION: Mildly motion degraded examination. No evidence of intracranial metastatic disease. Patchy acute infarcts within the right cerebellar hemisphere (PICA vascular territory) encompassing 4.6 x 1.9 cm in transaxial dimensions. 13 mm acute/early subacute infarct within the left frontoparietal subcortical white matter. Redemonstrated chronic left MCA territory cortical/subcortical infarct within the left frontal lobe, left insula/subinsular white matter and deep left frontal white matter. Background mild generalized cerebral atrophy and moderate cerebral white matter chronic small vessel ischemic disease. Remote blood products within the left midbrain indicative of prior hemorrhage at this site. Electronically Signed: By: Kellie Simmering DO On: 02/15/2020 16:34    Assessment: 69 y.o. female with medical history significant of bladder cancer s/p transurethral resection of bladder tumorwith secondary malignant  neoplasm of liver, stroke, congestive heart failure, atrial fibrillation not on anticoagulation due to previous hematuria, diabetes, hypertension, chronic pain, acquired thrombophilia endometrial mass, morbid obesity, frequent falls who presented with complaints of dizziness that was noted prior to going to bed on 10/12. Patient reports no numbness or focal weakness.  Has baseline BLE weakness requiring ambulation with a walker.  MRI of the brain personally reviewed and reveals acute right cerebellar infarcts and an acute/subacaute left frontoparietal subcortical white matter infarct.  Etiology likely embolic and related to afib.    Carotid dopplers pending.  Echocardiogram from 7/21 shows no cardiac source of emboli with an EF of >55%.  A1c 6.0, LDL pending.  Stroke Risk Factors - atrial fibrillation, diabetes mellitus and hypertension  Plan: 1. Fasting lipid panel.  Target LDL<70. 2. PT consult, OT consult, Speech consult 3. Carotid dopplers 4. Prophylactic therapy-Agree with anticoagulation 5. Telemetry monitoring 6. Frequent neuro checks 7. Patient to follow up with neurology  after discharge   Alexis Goodell, MD Neurology  02/16/2020, 10:25 AM

## 2020-02-16 NOTE — Progress Notes (Signed)
PROGRESS NOTE    Brittney Shepard  ZOX:[id number] DOB: 1950-09-28 DOA: 02/14/2020 PCP: Marguerita Merles, MD    Brief Narrative:  Brittney Shepard is a 69 y.o. female with medical history significant of Bladder cancer s/p transurethral resection of bladder tumorwith secondary malignant neoplasm of liver, stroke, congestive heart failure, atrial fibrillation not on anticoagultion, diabetes, hypertension, chronic pain.Acquired thrombophilia endometrial mass, morbid obesity frequent falls, COPD, indwelling catheter, mediastinal mass Presented with   feeling dizziness with movement for past 1 day and had an episode of vomiting. Describes vertigo-like sensation which is worse when she is trying to move.  10/15- had afib rvr on tele.   Consultants:   Oncology, neuroloyg  Procedures:  CT head Remote infarcts. Senescent changes. No evidence of acute intracranial hemorrhage or infarct.    Antimicrobials:       Subjective: Still with some dizziness when watching tv. No double vision or any other complaints  Objective: Vitals:   02/16/20 0735 02/16/20 0811 02/16/20 1345 02/16/20 1543  BP:  (!) 108/58  121/79  Pulse:  93  81  Resp:  16  18  Temp:  97.6 F (36.4 C)  97.6 F (36.4 C)  TempSrc:  Oral  Oral  SpO2: 92% 95% 95% 98%  Weight:      Height:        Intake/Output Summary (Last 24 hours) at 02/16/2020 1606 Last data filed at 02/16/2020 8119 Gross per 24 hour  Intake 113.19 ml  Output 875 ml  Net -761.81 ml   Filed Weights   02/15/20 1700  Weight: 118.3 kg    Examination: Appears calm and comfortable, neurology at bedside CTA, no wheeze rales rhonchi's Irregular, s1/s2 no murmurs Soft obese , nt/nd +bs Aaxox3 Mild b/l edema Mood and affect appropriate in current setting      Data Reviewed: I have personally reviewed following labs and imaging studies  CBC: Recent Labs  Lab 02/14/20 1949 02/15/20 0501  WBC 16.0* 10.7*  NEUTROABS  --  8.9*  HGB  12.3 11.2*  HCT 39.6 35.2*  MCV 86.5 87.6  PLT 430* 147   Basic Metabolic Panel: Recent Labs  Lab 02/14/20 1949 02/15/20 0501 02/16/20 0458  NA 135 137 141  K 4.7 4.6 4.1  CL 102 106 108  CO2 20* 22 26  GLUCOSE 209* 152* 101*  BUN 21 20 16   CREATININE 1.54* 1.31* 1.10*  CALCIUM 8.0* 7.6* 8.0*  MG  --  2.0  --   PHOS  --  3.8  --    GFR: Estimated Creatinine Clearance: 55.2 mL/min (A) (by C-G formula based on SCr of 1.1 mg/dL (H)). Liver Function Tests: Recent Labs  Lab 02/15/20 0501  AST 13*  ALT 7  ALKPHOS 64  BILITOT 1.0  PROT 6.5  ALBUMIN 2.8*   No results for input(s): LIPASE, AMYLASE in the last 168 hours. No results for input(s): AMMONIA in the last 168 hours. Coagulation Profile: No results for input(s): INR, PROTIME in the last 168 hours. Cardiac Enzymes: No results for input(s): CKTOTAL, CKMB, CKMBINDEX, TROPONINI in the last 168 hours. BNP (last 3 results) No results for input(s): PROBNP in the last 8760 hours. HbA1C: Recent Labs    02/15/20 0501  HGBA1C 6.0*   CBG: Recent Labs  Lab 02/15/20 1958 02/16/20 0011 02/16/20 0432 02/16/20 0815 02/16/20 1148  GLUCAP 116* 100* 89 103* 168*   Lipid Profile: Recent Labs    02/16/20 0458  CHOL 144  HDL 22*  LDLCALC 90  TRIG 160*  CHOLHDL 6.5   Thyroid Function Tests: Recent Labs    02/15/20 0501  TSH 0.821   Anemia Panel: No results for input(s): VITAMINB12, FOLATE, FERRITIN, TIBC, IRON, RETICCTPCT in the last 72 hours. Sepsis Labs: No results for input(s): PROCALCITON, LATICACIDVEN in the last 168 hours.  Recent Results (from the past 240 hour(s))  Urine culture     Status: Abnormal   Collection Time: 02/14/20  7:49 PM   Specimen: Urine, Random  Result Value Ref Range Status   Specimen Description   Final    URINE, RANDOM Performed at [facility], [address], [place], [place] [address]    Special Requests   Final    NONE Performed at [facility], [address] [place], [place] [address]    Culture MULTIPLE SPECIES PRESENT, SUGGEST RECOLLECTION (A)  Final   Report Status 02/16/2020 FINAL  Final  Respiratory Panel by RT PCR (Flu A&B, Covid) - Nasopharyngeal Swab     Status: None   Collection Time: 02/14/20  9:45 PM   Specimen: Nasopharyngeal Swab  Result Value Ref Range Status   SARS Coronavirus 2 by RT PCR NEGATIVE NEGATIVE Final    Comment: (NOTE) SARS-CoV-2 target nucleic acids are NOT DETECTED.  The SARS-CoV-2 RNA is generally detectable in upper respiratoy specimens during the acute phase of infection. The lowest concentration of SARS-CoV-2 viral copies this assay can detect is 131 copies/mL. A negative result does not preclude SARS-Cov-2 infection and should not be used as the sole basis for treatment or other patient management decisions. A negative result may occur with  improper specimen collection/handling, submission of specimen other than nasopharyngeal swab, presence of viral mutation(s) within the areas targeted by this assay, and inadequate number of viral copies (<131 copies/mL). A negative result must be combined with clinical observations, patient history, and epidemiological information. The expected result is Negative.  Fact Sheet for Patients:  PinkCheek.be  Fact Sheet for Healthcare Providers:  GravelBags.it  This test is no t yet approved or cleared by the [place] FDA and  has been authorized for detection and/or diagnosis of SARS-CoV-2 by FDA under an Emergency Use Authorization (EUA). This EUA will remain  in effect (meaning this test can be used) for the duration of the COVID-19 declaration under Section 564(b)(1) of the Act, 21 U.S.C. section 360bbb-3(b)(1), unless the authorization is terminated or revoked sooner.     Influenza A by PCR NEGATIVE NEGATIVE Final   Influenza B by PCR NEGATIVE NEGATIVE Final    Comment: (NOTE) The  Xpert Xpress SARS-CoV-2/FLU/RSV assay is intended as an aid in  the diagnosis of influenza from Nasopharyngeal swab specimens and  should not be used as a sole basis for treatment. Nasal washings and  aspirates are unacceptable for Xpert Xpress SARS-CoV-2/FLU/RSV  testing.  Fact Sheet for Patients: PinkCheek.be  Fact Sheet for Healthcare Providers: GravelBags.it  This test is not yet approved or cleared by the [place] FDA and  has been authorized for detection and/or diagnosis of SARS-CoV-2 by  FDA under an Emergency Use Authorization (EUA). This EUA will remain  in effect (meaning this test can be used) for the duration of the  Covid-19 declaration under Section 564(b)(1) of the Act, 21  U.S.C. section 360bbb-3(b)(1), unless the authorization is  terminated or revoked. Performed at [facility], [address], [place], [place] [address]          Radiology Studies: CT Head  Wo Contrast  Result Date: 02/14/2020 CLINICAL DATA:  Vertigo EXAM: CT HEAD WITHOUT CONTRAST TECHNIQUE: Contiguous axial images were obtained from the base of the skull through the vertex without intravenous contrast. COMPARISON:  03/31/2009 FINDINGS: Brain: Ends level malacia secondary to remote left frontal infarct is present. Remote lacunar infarct noted within the left basal ganglia. Mild parenchymal volume loss is present, commensurate with the patient's age. Mild periventricular white matter changes are present likely reflecting the sequela of small vessel ischemia. No evidence of acute intracranial hemorrhage or infarct. No abnormal mass effect or midline shift. No abnormal intra or extra-axial mass lesion or fluid collection. The ventricular size is normal. Cerebellum is unremarkable. Vascular: No asymmetric hyperdense vasculature at the skull base. Skull: Intact Sinuses/Orbits: The visualized paranasal sinuses are clear. The visualized  orbits are unremarkable. Other: Visualized mastoid air cells and middle ear cavities are clear. IMPRESSION: Remote infarcts. Senescent changes. No evidence of acute intracranial hemorrhage or infarct. Electronically Signed   By: Fidela Salisbury MD   On: 02/14/2020 22:11   MR BRAIN W WO CONTRAST  Addendum Date: 02/15/2020   ADDENDUM REPORT: 02/15/2020 17:00 ADDENDUM: These results were called by telephone at the time of interpretation on 02/15/2020 at 5:00 pm to provider Dr. Nolberto Hanlon, who verbally acknowledged these results. Electronically Signed   By: Kellie Simmering DO   On: 02/15/2020 17:00   Result Date: 02/15/2020 CLINICAL DATA:  Metastatic disease evaluation. EXAM: MRI HEAD WITHOUT AND WITH CONTRAST TECHNIQUE: Multiplanar, multiecho pulse sequences of the brain and surrounding structures were obtained without and with intravenous contrast. CONTRAST:  7.57mL GADAVIST GADOBUTROL 1 MMOL/ML IV SOLN COMPARISON:  Noncontrast head CT 02/14/2020. FINDINGS: Brain: Mild intermittent motion degradation. This includes mild motion degradation of the axial and coronal postcontrast T1 weighted sequences. Mild generalized cerebral atrophy. No abnormal enhancement is demonstrated to suggest intracranial metastatic disease. There is patchy restricted diffusion within the inferomedial right cerebellar hemisphere and right cerebellar tonsil (PICA vascular territory) consistent with acute infarction. This encompasses a region measuring 4.6 x 1.9 cm in transaxial dimensions (series 5, image 11). Corresponding T2/FLAIR hyperintensity at this site. Additional 13 mm focus of diffusion weighted hyperintensity and corresponding intermediate signal on the ADC map consistent with acute/early subacute infarction (series 5, image 34) (series 7, image 15). Redemonstrated chronic left MCA territory cortical/subcortical infarct within the frontal lobe, left insula/subinsular white matter and deep left frontal white matter. Mild chronic  blood products are present at this site. Chronic blood products are also present within the left midbrain likely reflecting sequela of prior hemorrhage. Moderate multifocal T2/FLAIR hyperintensity within the cerebral white matter is nonspecific, but consistent with chronic small vessel ischemic disease. No extra-axial fluid collection. No midline shift. Incidentally noted pineal gland cyst without suspicious masslike or nodular enhancement. Vascular: Expected proximal arterial flow voids. Skull and upper cervical spine: No focal suspicious marrow lesion. Sinuses/Orbits: Visualized orbits show no acute finding. Mild ethmoid sinus mucosal thickening. Small right maxillary sinus mucous retention cyst. Trace fluid within bilateral mastoid air cells. IMPRESSION: Mildly motion degraded examination. No evidence of intracranial metastatic disease. Patchy acute infarcts within the right cerebellar hemisphere (PICA vascular territory) encompassing 4.6 x 1.9 cm in transaxial dimensions. 13 mm acute/early subacute infarct within the left frontoparietal subcortical white matter. Redemonstrated chronic left MCA territory cortical/subcortical infarct within the left frontal lobe, left insula/subinsular white matter and deep left frontal white matter. Background mild generalized cerebral atrophy and moderate cerebral white matter chronic small vessel ischemic disease.  Remote blood products within the left midbrain indicative of prior hemorrhage at this site. Electronically Signed: By: Kellie Simmering DO On: 02/15/2020 16:34        Scheduled Meds: . apixaban  5 mg Oral BID  . Chlorhexidine Gluconate Cloth  6 each Topical Daily  . darifenacin  7.5 mg Oral Daily  . diltiazem  60 mg Oral BID  . influenza vaccine adjuvanted  0.5 mL Intramuscular Tomorrow-1000  . insulin aspart  0-9 Units Subcutaneous Q4H  . ipratropium-albuterol  3 mL Nebulization TID  . meclizine  12.5 mg Oral BID  . metoprolol tartrate  12.5 mg Oral BID    Continuous Infusions: . sodium chloride Stopped (02/15/20 2200)  . cefTRIAXone (ROCEPHIN)  IV Stopped (02/15/20 2042)    Assessment & Plan:   Active Problems:   Atrial fibrillation with RVR (HCC)   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HCC)   Essential hypertension   Type 2 diabetes mellitus without complication, without long-term current use of insulin (HCC)   Morbid obesity with BMI of 45.0-49.9, adult (HCC)   Chronic diastolic CHF (congestive heart failure) (HCC)   Endometrial cancer (HCC)   COPD with acute bronchitis (HCC)   Vertigo   Dehydration   AKI (acute kidney injury) ([place])   Cerebellar stroke ([place])   Metastatic carcinoma ([place])   69 y.o. female with medical history significant of Bladder cancer s/p transurethral resection of bladder tumorwith secondary malignant neoplasm of liver, stroke, congestive heart failure, atrial fibrillation not on anticoagultion, diabetes, hypertension, chronic pain.Acquired thrombophilia endometrial mass, morbid obesity frequent falls, COPD, indwelling catheter, mediastinal mass admitted for AKI, dehydration, dizziness.  Vertigo- Continue with meclizine.  Part of her dizziness possibly due to her stroke.   MRI completed with stroke, see full report No evidence of brain mets  Acute /early subacute infarct- MRI found with acute /early subacute infarct within the left frontoparietal subcortical white matter Redemonstrated chronic left MCA territory infarct within the left frontal lobe some evidence of prior hemorrhage patchy acute infarct within the right cerebellar hemisphere Neurology consulted and following The due to A. Fib, patient was not on anticoagulation as outpatient Spoke to oncology they cleared patient from oncologic standpoint to be started on anticoagulation Started patient on Eliquis Already Doppler Neurochecks Target fasting lipid panel LDL less than 70 PT recommend SNF with hospice     . Urothelial carcinoma of  bladder ([place]) widely metastatic - ncology consulted, input appreciated.  No plans for systemic treatment and should continue with hospice services  Palliative consulted, need rehab with palliative care and once goes back home needs hospice care  she is receiving hospice care with our care PT/OT- recommend SNF Oncology consulted  . Essential hypertension - Stable, continue with Cardizem and beta-blocker   . Endometrial cancer [facility]) - Oncology as above   . Chronic diastolic CHF (congestive heart failure) (HCC) - -no acute exacerbation  Diuretic on hold due to dry side  Monitor fluid status and resume Lasix when possible     . Atrial fibrillation with RVR (HCC) - Does have intermittent A. fib RVR still and improves Will continue on Cardizem and beta-blockers, currently cannot adjust due to BP being on low control side   . COPD with hx/o bronchitis ([place]) -without exacerbation, continue home meds   . Dehydration -was rehydrated Resolved Recommend hydration p.o.  . AKI (acute kidney injury) ([place]) - - likely secondary to dehydration Improved, creatinine 1.10 with IV fluids Continue to monitor Avoid nephrotoxic medications  UTi - pt with indwelling catheter, unsure if true infection vs colonization Urine culture with mixed species needs recollection We will continue IV Rocephin today  DVT prophylaxis: Eliquis Code Status:dnr Family Communication: none at bedside  Status is: inpatient  Patient remains as inpatient as she requires diagnostic workup for stroke  Dispo: The patient is from: Home              Anticipated d/c is to: SNF              Anticipated d/c date is: 1 day              Patient currently is not medically stable to d/c.still needs to complete w/u , and needs placement      ADDENDUM: MRI with acute stroke, see full result. Neurology consulted, Dr. Doy Mince consulted. Spoke to her about MRI , recommended with her acute stroke and hx/o afib should  be treated with anticoagulation.  Not sure why patient was not on anticoagulation previously need to find this out before starting her on anticoagulation.  I have sent the chart to oncology about this too.      LOS: 0 days   Time spent: 45 min with >50% on coc    Nolberto Hanlon, MD [facility] Pager [phone]  If 7PM-7AM, please contact night-coverage [email or web address] Password [email or web address] 02/16/2020, 4:06 PM

## 2020-02-16 NOTE — TOC Progression Note (Signed)
Transition of Care [facility]) - Progression Note    Patient Details  Name: Brittney Shepard MRN: [id number] Date of Birth: Jun 02, 1950  Transition of Care [facility]) CM/SW [place], RN Phone Number: 02/16/2020, 4:41 PM  Clinical Narrative:   RNCM met with patient at bedside, patient is agreeable to SNF and does not have preference to where she goes. Patient reports that she has been to [facility] and [facility] in the past and is unsure about where she would like to do. RNCM verified PASSR, completed FL-2 and initiated bed search.     Expected Discharge Plan: [place] Barriers to Discharge: Continued Medical Work up  Expected Discharge Plan and Services Expected Discharge Plan: [place] In-house Referral: Clinical Social Work Discharge Planning Services: CM Consult Post Acute Care Choice: Nursing Home Living arrangements for the past 2 months: Single Family Home                                       Social Determinants of Health (SDOH) Interventions    Readmission Risk Interventions No flowsheet data found.

## 2020-02-16 NOTE — Progress Notes (Signed)
Initial Nutrition Assessment  DOCUMENTATION CODES:   Morbid obesity  INTERVENTION:  Ensure Enlive po BID, each supplement provides 350 kcal and 20 grams of protein (prefers strawberry)  Consider bowel regimen  NUTRITION DIAGNOSIS:   Increased nutrient needs related to cancer and cancer related treatments as evidenced by estimated needs.    GOAL:   Patient will meet greater than or equal to 90% of their needs    MONITOR:   PO intake, Weight trends, Labs, I & O's, Supplement acceptance  REASON FOR ASSESSMENT:   Consult Assessment of nutrition requirement/status  ASSESSMENT:  69 year old female with history of bladder cancer s/p transurethral resection of tumor with secondary malignant neoplasm of liver, stroke, chronic dCHF, atrial fibrillation not on anticoagulation, DM, HTN, chronic pain, thrombophilia, COPD, morbid obesity, and recent PET positive for endometrial mass with peritoneal adenopathy concerning for metastatic endometrial cancer. Pt presented with nausea and vertigo and found to have acute right cerebellar and left parietal CVA.  Pt is a current hospice pt. She is in bed eating sherbet this afternoon. Pt reports that she ordered a baked potato with sour cream, says her order was incorrect, asked for ice cream instead. Pt appears slightly confused and is a poor historian. She endorses eating well, recalls pizza dinners and cereal with banana, coffee and fruit smoothies, states she does not care for meat anymore. Noted poor dentition, denied chewing/swallowing difficulties. RD educated on the importance of adequate nutrition, pt agreeable to drinking Ensure supplement to aid with meeting needs.  Pt reports usually having 1-2 bowel movements daily. No BMs this admission, consider bowel regimen.   Medications reviewed and include: SSI  Labs: CBGs 168,103,89,100 10/14 A1c 6.0  NUTRITION - FOCUSED PHYSICAL EXAM: Mild orbital fat depletion; Mild temple muscle  depletion   Diet Order:   Diet Order            Diet Carb Modified Fluid consistency: Thin; Room service appropriate? Yes  Diet effective now                 EDUCATION NEEDS:   No education needs have been identified at this time  Skin:  Skin Assessment: Reviewed RN Assessment  Last BM:  pta  Height:   Ht Readings from Last 1 Encounters:  02/15/20 5' (1.524 m)    Weight:   Wt Readings from Last 1 Encounters:  02/15/20 118.3 kg    BMI:  Body mass index is 50.93 kg/m.  Estimated Nutritional Needs:   Kcal:  2200-2400  Protein:  110-120  Fluid:  >/= 2 L/day   Lajuan Lines, RD, LDN Clinical Nutrition After Hours/Weekend Pager # in [place]

## 2020-02-17 ENCOUNTER — Inpatient Hospital Stay

## 2020-02-17 DIAGNOSIS — N179 Acute kidney failure, unspecified: Secondary | ICD-10-CM | POA: Diagnosis not present

## 2020-02-17 DIAGNOSIS — J440 Chronic obstructive pulmonary disease with acute lower respiratory infection: Secondary | ICD-10-CM | POA: Diagnosis not present

## 2020-02-17 DIAGNOSIS — I5032 Chronic diastolic (congestive) heart failure: Secondary | ICD-10-CM | POA: Diagnosis not present

## 2020-02-17 DIAGNOSIS — I639 Cerebral infarction, unspecified: Secondary | ICD-10-CM | POA: Diagnosis not present

## 2020-02-17 DIAGNOSIS — I4891 Unspecified atrial fibrillation: Secondary | ICD-10-CM | POA: Diagnosis not present

## 2020-02-17 LAB — GLUCOSE, CAPILLARY
Glucose-Capillary: 109 mg/dL — ABNORMAL HIGH (ref 70–99)
Glucose-Capillary: 110 mg/dL — ABNORMAL HIGH (ref 70–99)
Glucose-Capillary: 116 mg/dL — ABNORMAL HIGH (ref 70–99)
Glucose-Capillary: 131 mg/dL — ABNORMAL HIGH (ref 70–99)
Glucose-Capillary: 137 mg/dL — ABNORMAL HIGH (ref 70–99)
Glucose-Capillary: 137 mg/dL — ABNORMAL HIGH (ref 70–99)

## 2020-02-17 MED ORDER — IPRATROPIUM-ALBUTEROL 0.5-2.5 (3) MG/3ML IN SOLN
3.0000 mL | Freq: Two times a day (BID) | RESPIRATORY_TRACT | Status: DC
  Administered 2020-02-17 – 2020-02-18 (×2): 3 mL via RESPIRATORY_TRACT
  Filled 2020-02-17 (×2): qty 3

## 2020-02-17 MED ORDER — MECLIZINE HCL 25 MG PO TABS
25.0000 mg | ORAL_TABLET | Freq: Two times a day (BID) | ORAL | Status: DC
  Administered 2020-02-17 – 2020-02-22 (×10): 25 mg via ORAL
  Filled 2020-02-17 (×13): qty 1

## 2020-02-17 NOTE — Progress Notes (Signed)
PT Cancellation Note  Patient Details Name: Brittney Shepard MRN: [id number] DOB: 07-Jul-1950   Cancelled Treatment:     PT attempt. Pt refused. Max encouragement to participate however pt unwilling. Reports she has been having dizziness. Discussed with RN. Acute PT will continue efforts to treat per POC when pt is willing.    Willette Pa 02/17/2020, 1:08 PM

## 2020-02-17 NOTE — Consult Note (Signed)
Subjective: Improvement in the dizziness today  Past Medical History:  Diagnosis Date  . A-fib ([place])   . Bladder cancer ([place])   . CHF (congestive heart failure) ([place])   . Chronic pain   . Cognitive communication deficit   . Diabetes mellitus without complication ([place])   . Hypertension   . Hypokalemia   . Muscle weakness (generalized)   . 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 Secondary malignant neoplasm of liver ([place])   . Stroke [facility]) 2013  . Urinary tract infection, site not specified     Past Surgical History:  Procedure Laterality Date  . CYSTOSCOPY W/ RETROGRADES N/A 11/20/2019   Procedure: CYSTOSCOPY WITH RETROGRADE PYELOGRAM;  Surgeon: Billey Co, MD;  Location: [facility] ORS;  Service: Urology;  Laterality: N/A;  . CYSTOSCOPY WITH STENT PLACEMENT Left 11/20/2019   Procedure: CYSTOSCOPY WITH STENT PLACEMENT;  Surgeon: Billey Co, MD;  Location: [facility] ORS;  Service: Urology;  Laterality: Left;  . TRANSURETHRAL RESECTION OF BLADDER TUMOR N/A 11/20/2019   Procedure: TRANSURETHRAL RESECTION OF BLADDER TUMOR (TURBT);  Surgeon: Billey Co, MD;  Location: [facility] ORS;  Service: Urology;  Laterality: N/A;    Family History  Problem Relation Age of Onset  . Ovarian cancer Mother   . Cancer Sister    Social History:  reports that she has never smoked. She has never used smokeless tobacco. She reports that she does not drink alcohol and does not use drugs.  Allergies:  Allergies  Allergen Reactions  . Penicillin G Hives  . Sulfa Antibiotics Rash    Medications:  I have reviewed the patient's current medications. Prior to Admission:  Medications Prior to Admission  Medication Sig Dispense Refill Last Dose  . acetaminophen (TYLENOL) 325 MG tablet Take 2 tablets (650 mg total) by mouth every 6 (six) hours as needed for mild pain (or Fever >/= 101). 60 tablet 0 prn at prn  . diltiazem (CARDIZEM SR) 60 MG 12 hr capsule Take 1 capsule (60 mg total) by mouth  every 12 (twelve) hours. (Patient taking differently: Take 60 mg by mouth 2 (two) times daily. ) 60 capsule 0 Unknown at Unknown  . furosemide (LASIX) 40 MG tablet Take 1 tablet (40 mg total) by mouth 2 (two) times daily. 30 tablet 0 Unknown at Unknown  . metFORMIN (GLUCOPHAGE) 500 MG tablet Take 500 mg by mouth daily with breakfast.    Unknown at Unknown  . potassium chloride (KLOR-CON) 10 MEQ tablet Take 10 mEq by mouth 2 (two) times daily.   Unknown at Unknown  . VESICARE 10 MG tablet Take 10 mg by mouth daily.   Unknown at Unknown  . apixaban (ELIQUIS) 2.5 MG TABS tablet Take 1 tablet (2.5 mg total) by mouth 2 (two) times daily. (Patient not taking: Reported on 01/15/2020) 60 tablet 1 Not Taking  . atenolol (TENORMIN) 25 MG tablet Take 1 tablet (25 mg total) by mouth daily. (Patient not taking: Reported on 02/15/2020) 30 tablet 1 Not Taking  . ipratropium-albuterol (DUONEB) 0.5-2.5 (3) MG/3ML SOLN Take 3 mLs by nebulization 3 (three) times daily. (Patient not taking: Reported on 01/15/2020) 360 mL 0 Not Taking  . Multiple Vitamin (MULTIVITAMIN) tablet Take 1 tablet by mouth daily. (Patient not taking: Reported on 01/15/2020)   Not Taking  . polyethylene glycol (MIRALAX / GLYCOLAX) 17 g packet Take 17 g by mouth daily as needed for mild constipation. (Patient not taking: Reported on 01/15/2020) 14 each 0 Not Taking  .  senna-docusate (SENOKOT-S) 8.6-50 MG tablet Take 1 tablet by mouth 2 (two) times daily. (Patient not taking: Reported on 01/15/2020) 60 tablet 0 Not Taking   Scheduled: . apixaban  5 mg Oral BID  . Chlorhexidine Gluconate Cloth  6 each Topical Daily  . darifenacin  7.5 mg Oral Daily  . diltiazem  60 mg Oral BID  . influenza vaccine adjuvanted  0.5 mL Intramuscular Tomorrow-1000  . insulin aspart  0-9 Units Subcutaneous Q4H  . ipratropium-albuterol  3 mL Nebulization BID  . meclizine  12.5 mg Oral BID  . metoprolol tartrate  12.5 mg Oral BID    ROS: History obtained from the  patient  General ROS: negative for - chills, fatigue, fever, night sweats, weight gain or weight loss Psychological ROS: negative for - behavioral disorder, hallucinations, memory difficulties, mood swings or suicidal ideation Ophthalmic ROS: negative for - blurry vision, double vision, eye pain or loss of vision ENT ROS: negative for - epistaxis, nasal discharge, oral lesions, sore throat, tinnitus or vertigo Allergy and Immunology ROS: negative for - hives or itchy/watery eyes Hematological and Lymphatic ROS: negative for - bleeding problems, bruising or swollen lymph nodes Endocrine ROS: negative for - galactorrhea, hair pattern changes, polydipsia/polyuria or temperature intolerance Respiratory ROS: negative for - cough, hemoptysis, shortness of breath or wheezing Cardiovascular ROS: lower extremity edema Gastrointestinal ROS: negative for - abdominal pain, diarrhea, hematemesis, nausea/vomiting or stool incontinence Genito-Urinary ROS: negative for - dysuria, hematuria, incontinence or urinary frequency/urgency Musculoskeletal ROS: negative for - joint swelling or muscular weakness Neurological ROS: as noted in HPI Dermatological ROS: negative for rash and skin lesion changes  Physical Examination: Blood pressure (!) 105/52, pulse (!) 110, temperature 98.1 F (36.7 C), resp. rate 15, height 5' (1.524 m), weight 118.3 kg, SpO2 96 %.  HEENT-  Normocephalic, no lesions, without obvious abnormality.  Normal external eye and conjunctiva.  Normal TM's bilaterally.  Normal auditory canals and external ears. Normal external nose, mucus membranes and septum.  Normal pharynx. Cardiovascular- S1, S2 normal, pulses palpable throughout   Lungs- chest clear, no wheezing, rales, normal symmetric air entry Abdomen- soft, non-tender; bowel sounds normal; no masses,  no organomegaly Extremities- mild BLE edema Lymph-no adenopathy palpable Musculoskeletal-no joint tenderness, deformity or  swelling Skin-warm and dry, no hyperpigmentation, vitiligo, or suspicious lesions  Neurological Examination   Mental Status: Alert, some mild confusion noted.  Speech fluent without evidence of aphasia.  Able to follow 3 step commands without difficulty. Cranial Nerves: II: Visual fields grossly normal, pupils equal, round, reactive to light and accommodation III,IV, VI: ptosis not present, extra-ocular motions intact bilaterally V,VII: smile symmetric, facial light touch sensation normal bilaterally VIII: hearing normal bilaterally IX,X: gag reflex present XI: bilateral shoulder shrug XII: midline tongue extension Motor: Right : Upper extremity   5-/5    Left:     Upper extremity   5/5  Lower extremity   4-/5     Lower extremity   4-/5 Tone and bulk:normal tone throughout; no atrophy noted Sensory: Pinprick and light touch intact throughout, bilaterally Deep Tendon Reflexes: Symmetric throughout Plantars: Right: mute   Left: mute Cerebellar: Normal finger-to-nose testing bilaterally Gait: not tested due to safety concerns   Laboratory Studies:  Basic Metabolic Panel: Recent Labs  Lab 02/14/20 1949 02/15/20 0501 02/16/20 0458  NA 135 137 141  K 4.7 4.6 4.1  CL 102 106 108  CO2 20* 22 26  GLUCOSE 209* 152* 101*  BUN 21 20 16   CREATININE  1.54* 1.31* 1.10*  CALCIUM 8.0* 7.6* 8.0*  MG  --  2.0  --   PHOS  --  3.8  --     Liver Function Tests: Recent Labs  Lab 02/15/20 0501  AST 13*  ALT 7  ALKPHOS 64  BILITOT 1.0  PROT 6.5  ALBUMIN 2.8*   No results for input(s): LIPASE, AMYLASE in the last 168 hours. No results for input(s): AMMONIA in the last 168 hours.  CBC: Recent Labs  Lab 02/14/20 1949 02/15/20 0501  WBC 16.0* 10.7*  NEUTROABS  --  8.9*  HGB 12.3 11.2*  HCT 39.6 35.2*  MCV 86.5 87.6  PLT 430* 326    Cardiac Enzymes: No results for input(s): CKTOTAL, CKMB, CKMBINDEX, TROPONINI in the last 168 hours.  BNP: Invalid input(s):  POCBNP  CBG: Recent Labs  Lab 02/16/20 1947 02/17/20 0010 02/17/20 0441 02/17/20 0736 02/17/20 1140  GLUCAP 110* 109* 110* 116* 70*    Microbiology: Results for orders placed or performed during the hospital encounter of 02/14/20  Urine culture     Status: Abnormal   Collection Time: 02/14/20  7:49 PM   Specimen: Urine, Random  Result Value Ref Range Status   Specimen Description   Final    URINE, RANDOM Performed at [facility], [address] [place], [place] [address]    Special Requests   Final    NONE Performed at [facility], [address] [place], [place] [address]    Culture MULTIPLE SPECIES PRESENT, SUGGEST RECOLLECTION (A)  Final   Report Status 02/16/2020 FINAL  Final  Respiratory Panel by RT PCR (Flu A&B, Covid) - Nasopharyngeal Swab     Status: None   Collection Time: 02/14/20  9:45 PM   Specimen: Nasopharyngeal Swab  Result Value Ref Range Status   SARS Coronavirus 2 by RT PCR NEGATIVE NEGATIVE Final    Comment: (NOTE) SARS-CoV-2 target nucleic acids are NOT DETECTED.  The SARS-CoV-2 RNA is generally detectable in upper respiratoy specimens during the acute phase of infection. The lowest concentration of SARS-CoV-2 viral copies this assay can detect is 131 copies/mL. A negative result does not preclude SARS-Cov-2 infection and should not be used as the sole basis for treatment or other patient management decisions. A negative result may occur with  improper specimen collection/handling, submission of specimen other than nasopharyngeal swab, presence of viral mutation(s) within the areas targeted by this assay, and inadequate number of viral copies (<131 copies/mL). A negative result must be combined with clinical observations, patient history, and epidemiological information. The expected result is Negative.  Fact Sheet for Patients:  PinkCheek.be  Fact Sheet for Healthcare Providers:   GravelBags.it  This test is no t yet approved or cleared by the [place] FDA and  has been authorized for detection and/or diagnosis of SARS-CoV-2 by FDA under an Emergency Use Authorization (EUA). This EUA will remain  in effect (meaning this test can be used) for the duration of the COVID-19 declaration under Section 564(b)(1) of the Act, 21 U.S.C. section 360bbb-3(b)(1), unless the authorization is terminated or revoked sooner.     Influenza A by PCR NEGATIVE NEGATIVE Final   Influenza B by PCR NEGATIVE NEGATIVE Final    Comment: (NOTE) The Xpert Xpress SARS-CoV-2/FLU/RSV assay is intended as an aid in  the diagnosis of influenza from Nasopharyngeal swab specimens and  should not be used as a sole basis for treatment. Nasal washings and  aspirates are unacceptable for Xpert Xpress SARS-CoV-2/FLU/RSV  testing.  Fact Sheet for Patients: PinkCheek.be  Fact Sheet for Healthcare Providers: GravelBags.it  This test is not yet approved or cleared by the [place] FDA and  has been authorized for detection and/or diagnosis of SARS-CoV-2 by  FDA under an Emergency Use Authorization (EUA). This EUA will remain  in effect (meaning this test can be used) for the duration of the  Covid-19 declaration under Section 564(b)(1) of the Act, 21  U.S.C. section 360bbb-3(b)(1), unless the authorization is  terminated or revoked. Performed at [facility], [address] [place], [place] [address]     Coagulation Studies: No results for input(s): LABPROT, INR in the last 72 hours.  Urinalysis:  Recent Labs  Lab 02/14/20 1949  COLORURINE AMBER*  LABSPEC 1.012  PHURINE 7.0  GLUCOSEU NEGATIVE  HGBUR MODERATE*  BILIRUBINUR NEGATIVE  KETONESUR NEGATIVE  PROTEINUR 100*  NITRITE POSITIVE*  LEUKOCYTESUR LARGE*    Lipid Panel:    Component Value Date/Time   CHOL 144 02/16/2020  0458   TRIG 160 (H) 02/16/2020 0458   HDL 22 (L) 02/16/2020 0458   CHOLHDL 6.5 02/16/2020 0458   VLDL 32 02/16/2020 0458   LDLCALC 90 02/16/2020 0458    HgbA1C:  Lab Results  Component Value Date   HGBA1C 6.0 (H) 02/15/2020    Urine Drug Screen:  No results found for: LABOPIA, COCAINSCRNUR, LABBENZ, AMPHETMU, THCU, LABBARB  Alcohol Level: No results for input(s): ETH in the last 168 hours.  Other results: EKG: atrial fibrillation, rate 129bpm.  Imaging: MR BRAIN W WO CONTRAST  Addendum Date: 02/15/2020   ADDENDUM REPORT: 02/15/2020 17:00 ADDENDUM: These results were called by telephone at the time of interpretation on 02/15/2020 at 5:00 pm to provider Dr. Nolberto Hanlon, who verbally acknowledged these results. Electronically Signed   By: Kellie Simmering DO   On: 02/15/2020 17:00   Result Date: 02/15/2020 CLINICAL DATA:  Metastatic disease evaluation. EXAM: MRI HEAD WITHOUT AND WITH CONTRAST TECHNIQUE: Multiplanar, multiecho pulse sequences of the brain and surrounding structures were obtained without and with intravenous contrast. CONTRAST:  7.46mL GADAVIST GADOBUTROL 1 MMOL/ML IV SOLN COMPARISON:  Noncontrast head CT 02/14/2020. FINDINGS: Brain: Mild intermittent motion degradation. This includes mild motion degradation of the axial and coronal postcontrast T1 weighted sequences. Mild generalized cerebral atrophy. No abnormal enhancement is demonstrated to suggest intracranial metastatic disease. There is patchy restricted diffusion within the inferomedial right cerebellar hemisphere and right cerebellar tonsil (PICA vascular territory) consistent with acute infarction. This encompasses a region measuring 4.6 x 1.9 cm in transaxial dimensions (series 5, image 11). Corresponding T2/FLAIR hyperintensity at this site. Additional 13 mm focus of diffusion weighted hyperintensity and corresponding intermediate signal on the ADC map consistent with acute/early subacute infarction (series 5, image 34)  (series 7, image 15). Redemonstrated chronic left MCA territory cortical/subcortical infarct within the frontal lobe, left insula/subinsular white matter and deep left frontal white matter. Mild chronic blood products are present at this site. Chronic blood products are also present within the left midbrain likely reflecting sequela of prior hemorrhage. Moderate multifocal T2/FLAIR hyperintensity within the cerebral white matter is nonspecific, but consistent with chronic small vessel ischemic disease. No extra-axial fluid collection. No midline shift. Incidentally noted pineal gland cyst without suspicious masslike or nodular enhancement. Vascular: Expected proximal arterial flow voids. Skull and upper cervical spine: No focal suspicious marrow lesion. Sinuses/Orbits: Visualized orbits show no acute finding. Mild ethmoid sinus mucosal thickening. Small right maxillary sinus mucous retention cyst. Trace fluid within bilateral  mastoid air cells. IMPRESSION: Mildly motion degraded examination. No evidence of intracranial metastatic disease. Patchy acute infarcts within the right cerebellar hemisphere (PICA vascular territory) encompassing 4.6 x 1.9 cm in transaxial dimensions. 13 mm acute/early subacute infarct within the left frontoparietal subcortical white matter. Redemonstrated chronic left MCA territory cortical/subcortical infarct within the left frontal lobe, left insula/subinsular white matter and deep left frontal white matter. Background mild generalized cerebral atrophy and moderate cerebral white matter chronic small vessel ischemic disease. Remote blood products within the left midbrain indicative of prior hemorrhage at this site. Electronically Signed: By: Kellie Simmering DO On: 02/15/2020 16:34   US Carotid Bilateral  Result Date: 02/17/2020 CLINICAL DATA:  69 year old female with history of stroke-like symptoms. EXAM: BILATERAL CAROTID DUPLEX ULTRASOUND TECHNIQUE: Pearline Cables scale imaging, color Doppler  and duplex ultrasound were performed of bilateral carotid and vertebral arteries in the neck. COMPARISON:  None. FINDINGS: Criteria: Quantification of carotid stenosis is based on velocity parameters that correlate the residual internal carotid diameter with NASCET-based stenosis levels, using the diameter of the distal internal carotid lumen as the denominator for stenosis measurement. The following velocity measurements were obtained: RIGHT ICA: Peak systolic velocity 43 cm/sec, End diastolic velocity 9 cm/sec CCA: Peak systolic velocity 72 cm/sec SYSTOLIC ICA/CCA RATIO:  0.6 ECA: Peak systolic velocity 76 cm/sec LEFT ICA: Peak systolic velocity 49 cm/sec, End diastolic velocity 8 cm/sec CCA: 83 cm/sec SYSTOLIC ICA/CCA RATIO:  0.6 ECA: 62 cm/sec RIGHT CAROTID ARTERY: No atherosclerotic plaque formation. No significant tortuosity. Normal low resistance waveforms. RIGHT VERTEBRAL ARTERY:  Antegrade flow. LEFT CAROTID ARTERY: No atherosclerotic plaque formation. No significant tortuosity. Normal low resistance waveforms. LEFT VERTEBRAL ARTERY:  Antegrade flow. Upper extremity non-invasive blood pressures: Not obtained. IMPRESSION: 1. Right carotid artery system: Patent without significant atherosclerotic plaque formation. 2. Left carotid artery system: Patent without significant atherosclerotic plaque formation. 3.  Vertebral artery system: Patent with antegrade flow bilaterally. Ruthann Cancer, MD [facility] Electronically Signed   By: Ruthann Cancer MD   On: 02/17/2020 10:42    Assessment: 69 y.o. female with medical history significant of bladder cancer s/p transurethral resection of bladder tumorwith secondary malignant neoplasm of liver, stroke, congestive heart failure, atrial fibrillation not on anticoagulation due to previous hematuria, diabetes, hypertension, chronic pain, acquired thrombophilia endometrial mass, morbid obesity, frequent falls who  presented with complaints of dizziness that was noted prior to going to bed on 10/12. Patient reports no numbness or focal weakness.  Has baseline BLE weakness requiring ambulation with a walker.  MRI of the brain personally reviewed and reveals acute right cerebellar infarcts and an acute/subacaute left frontoparietal subcortical white matter infarct.  Etiology likely embolic and related to afib.    Carotid dopplers pending.  Echocardiogram from 7/21 shows no cardiac source of emboli with an EF of >55%.  A1c 6.0, LDL pending.  Stroke Risk Factors - atrial fibrillation, diabetes mellitus and hypertension  - Noac anticoagulation as stroke likely in setting of a-fib - improved today - con't pt/ot - d/c planning  - US carotids no significant stenosis

## 2020-02-17 NOTE — Progress Notes (Addendum)
PROGRESS NOTE    Brittney Shepard  ZOX:[id number] DOB: January 20, 1951 DOA: 02/14/2020 PCP: Marguerita Merles, MD    Brief Narrative:  Brittney Shepard is a 69 y.o. female with medical history significant of Bladder cancer s/p transurethral resection of bladder tumorwith secondary malignant neoplasm of liver, stroke, congestive heart failure, atrial fibrillation not on anticoagultion, diabetes, hypertension, chronic pain.Acquired thrombophilia endometrial mass, morbid obesity frequent falls, COPD, indwelling catheter, mediastinal mass Presented with   feeling dizziness with movement for past 1 day and had an episode of vomiting. Describes vertigo-like sensation which is worse when she is trying to move.  10/15- had afib rvr on tele.  10/16- HR improved some.   Consultants:   Oncology, neuroloyg  Procedures:  CT head Remote infarcts. Senescent changes. No evidence of acute intracranial hemorrhage or infarct.    Antimicrobials:       Subjective: Tried eating something this am. At times feels dizzy still. No new complaints  Objective: Vitals:   02/16/20 2000 02/16/20 2116 02/17/20 0007 02/17/20 0736  BP:  118/71 122/76 (!) 105/52  Pulse:  78 90 (!) 110  Resp:  (!) 24 16 15   Temp:  98.1 F (36.7 C) (!) 97.4 F (36.3 C) 98.1 F (36.7 C)  TempSrc:  Oral Oral   SpO2: 93% 96% 93% 96%  Weight:      Height:        Intake/Output Summary (Last 24 hours) at 02/17/2020 0858 Last data filed at 02/17/2020 0448 Gross per 24 hour  Intake 115.32 ml  Output 450 ml  Net -334.68 ml   Filed Weights   02/15/20 1700  Weight: 118.3 kg    Examination: Calm, comfortable, laying in bed CTA, no wheeze rales rhonchi's Irreg-reg S1-S2 no murmurs Soft nt/nd +bs scd in place b/l aaxox3 Mood and affect appropriate     Data Reviewed: I have personally reviewed following labs and imaging studies  CBC: Recent Labs  Lab 02/14/20 1949 02/15/20 0501  WBC 16.0* 10.7*  NEUTROABS  --   8.9*  HGB 12.3 11.2*  HCT 39.6 35.2*  MCV 86.5 87.6  PLT 430* 811   Basic Metabolic Panel: Recent Labs  Lab 02/14/20 1949 02/15/20 0501 02/16/20 0458  NA 135 137 141  K 4.7 4.6 4.1  CL 102 106 108  CO2 20* 22 26  GLUCOSE 209* 152* 101*  BUN 21 20 16   CREATININE 1.54* 1.31* 1.10*  CALCIUM 8.0* 7.6* 8.0*  MG  --  2.0  --   PHOS  --  3.8  --    GFR: Estimated Creatinine Clearance: 55.2 mL/min (A) (by C-G formula based on SCr of 1.1 mg/dL (H)). Liver Function Tests: Recent Labs  Lab 02/15/20 0501  AST 13*  ALT 7  ALKPHOS 64  BILITOT 1.0  PROT 6.5  ALBUMIN 2.8*   No results for input(s): LIPASE, AMYLASE in the last 168 hours. No results for input(s): AMMONIA in the last 168 hours. Coagulation Profile: No results for input(s): INR, PROTIME in the last 168 hours. Cardiac Enzymes: No results for input(s): CKTOTAL, CKMB, CKMBINDEX, TROPONINI in the last 168 hours. BNP (last 3 results) No results for input(s): PROBNP in the last 8760 hours. HbA1C: Recent Labs    02/15/20 0501  HGBA1C 6.0*   CBG: Recent Labs  Lab 02/16/20 1639 02/16/20 1947 02/17/20 0010 02/17/20 0441 02/17/20 0736  GLUCAP 134* 110* 109* 110* 116*   Lipid Profile: Recent Labs    02/16/20 0458  CHOL 144  HDL 22*  LDLCALC 90  TRIG 160*  CHOLHDL 6.5   Thyroid Function Tests: Recent Labs    02/15/20 0501  TSH 0.821   Anemia Panel: No results for input(s): VITAMINB12, FOLATE, FERRITIN, TIBC, IRON, RETICCTPCT in the last 72 hours. Sepsis Labs: No results for input(s): PROCALCITON, LATICACIDVEN in the last 168 hours.  Recent Results (from the past 240 hour(s))  Urine culture     Status: Abnormal   Collection Time: 02/14/20  7:49 PM   Specimen: Urine, Random  Result Value Ref Range Status   Specimen Description   Final    URINE, RANDOM Performed at [facility], [address], [place], [place] [address]    Special Requests   Final    NONE Performed at [facility], [address] [place], [place] [address]    Culture MULTIPLE SPECIES PRESENT, SUGGEST RECOLLECTION (A)  Final   Report Status 02/16/2020 FINAL  Final  Respiratory Panel by RT PCR (Flu A&B, Covid) - Nasopharyngeal Swab     Status: None   Collection Time: 02/14/20  9:45 PM   Specimen: Nasopharyngeal Swab  Result Value Ref Range Status   SARS Coronavirus 2 by RT PCR NEGATIVE NEGATIVE Final    Comment: (NOTE) SARS-CoV-2 target nucleic acids are NOT DETECTED.  The SARS-CoV-2 RNA is generally detectable in upper respiratoy specimens during the acute phase of infection. The lowest concentration of SARS-CoV-2 viral copies this assay can detect is 131 copies/mL. A negative result does not preclude SARS-Cov-2 infection and should not be used as the sole basis for treatment or other patient management decisions. A negative result may occur with  improper specimen collection/handling, submission of specimen other than nasopharyngeal swab, presence of viral mutation(s) within the areas targeted by this assay, and inadequate number of viral copies (<131 copies/mL). A negative result must be combined with clinical observations, patient history, and epidemiological information. The expected result is Negative.  Fact Sheet for Patients:  PinkCheek.be  Fact Sheet for Healthcare Providers:  GravelBags.it  This test is no t yet approved or cleared by the [place] FDA and  has been authorized for detection and/or diagnosis of SARS-CoV-2 by FDA under an Emergency Use Authorization (EUA). This EUA will remain  in effect (meaning this test can be used) for the duration of the COVID-19 declaration under Section 564(b)(1) of the Act, 21 U.S.C. section 360bbb-3(b)(1), unless the authorization is terminated or revoked sooner.     Influenza A by PCR NEGATIVE NEGATIVE Final   Influenza B by PCR NEGATIVE NEGATIVE Final     Comment: (NOTE) The Xpert Xpress SARS-CoV-2/FLU/RSV assay is intended as an aid in  the diagnosis of influenza from Nasopharyngeal swab specimens and  should not be used as a sole basis for treatment. Nasal washings and  aspirates are unacceptable for Xpert Xpress SARS-CoV-2/FLU/RSV  testing.  Fact Sheet for Patients: PinkCheek.be  Fact Sheet for Healthcare Providers: GravelBags.it  This test is not yet approved or cleared by the [place] FDA and  has been authorized for detection and/or diagnosis of SARS-CoV-2 by  FDA under an Emergency Use Authorization (EUA). This EUA will remain  in effect (meaning this test can be used) for the duration of the  Covid-19 declaration under Section 564(b)(1) of the Act, 21  U.S.C. section 360bbb-3(b)(1), unless the authorization is  terminated or revoked. Performed at [facility], [address], [place], [place] [address]          Radiology  Studies: MR BRAIN W WO CONTRAST  Addendum Date: 02/15/2020   ADDENDUM REPORT: 02/15/2020 17:00 ADDENDUM: These results were called by telephone at the time of interpretation on 02/15/2020 at 5:00 pm to provider Dr. Nolberto Hanlon, who verbally acknowledged these results. Electronically Signed   By: Kellie Simmering DO   On: 02/15/2020 17:00   Result Date: 02/15/2020 CLINICAL DATA:  Metastatic disease evaluation. EXAM: MRI HEAD WITHOUT AND WITH CONTRAST TECHNIQUE: Multiplanar, multiecho pulse sequences of the brain and surrounding structures were obtained without and with intravenous contrast. CONTRAST:  7.26mL GADAVIST GADOBUTROL 1 MMOL/ML IV SOLN COMPARISON:  Noncontrast head CT 02/14/2020. FINDINGS: Brain: Mild intermittent motion degradation. This includes mild motion degradation of the axial and coronal postcontrast T1 weighted sequences. Mild generalized cerebral atrophy. No abnormal enhancement is demonstrated to suggest intracranial  metastatic disease. There is patchy restricted diffusion within the inferomedial right cerebellar hemisphere and right cerebellar tonsil (PICA vascular territory) consistent with acute infarction. This encompasses a region measuring 4.6 x 1.9 cm in transaxial dimensions (series 5, image 11). Corresponding T2/FLAIR hyperintensity at this site. Additional 13 mm focus of diffusion weighted hyperintensity and corresponding intermediate signal on the ADC map consistent with acute/early subacute infarction (series 5, image 34) (series 7, image 15). Redemonstrated chronic left MCA territory cortical/subcortical infarct within the frontal lobe, left insula/subinsular white matter and deep left frontal white matter. Mild chronic blood products are present at this site. Chronic blood products are also present within the left midbrain likely reflecting sequela of prior hemorrhage. Moderate multifocal T2/FLAIR hyperintensity within the cerebral white matter is nonspecific, but consistent with chronic small vessel ischemic disease. No extra-axial fluid collection. No midline shift. Incidentally noted pineal gland cyst without suspicious masslike or nodular enhancement. Vascular: Expected proximal arterial flow voids. Skull and upper cervical spine: No focal suspicious marrow lesion. Sinuses/Orbits: Visualized orbits show no acute finding. Mild ethmoid sinus mucosal thickening. Small right maxillary sinus mucous retention cyst. Trace fluid within bilateral mastoid air cells. IMPRESSION: Mildly motion degraded examination. No evidence of intracranial metastatic disease. Patchy acute infarcts within the right cerebellar hemisphere (PICA vascular territory) encompassing 4.6 x 1.9 cm in transaxial dimensions. 13 mm acute/early subacute infarct within the left frontoparietal subcortical white matter. Redemonstrated chronic left MCA territory cortical/subcortical infarct within the left frontal lobe, left insula/subinsular white matter  and deep left frontal white matter. Background mild generalized cerebral atrophy and moderate cerebral white matter chronic small vessel ischemic disease. Remote blood products within the left midbrain indicative of prior hemorrhage at this site. Electronically Signed: By: Kellie Simmering DO On: 02/15/2020 16:34        Scheduled Meds: . apixaban  5 mg Oral BID  . Chlorhexidine Gluconate Cloth  6 each Topical Daily  . darifenacin  7.5 mg Oral Daily  . diltiazem  60 mg Oral BID  . influenza vaccine adjuvanted  0.5 mL Intramuscular Tomorrow-1000  . insulin aspart  0-9 Units Subcutaneous Q4H  . ipratropium-albuterol  3 mL Nebulization TID  . meclizine  12.5 mg Oral BID  . metoprolol tartrate  12.5 mg Oral BID   Continuous Infusions: . sodium chloride Stopped (02/17/20 0053)  . cefTRIAXone (ROCEPHIN)  IV Stopped (02/16/20 2322)    Assessment & Plan:   Active Problems:   Atrial fibrillation with RVR (HCC)   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HCC)   Essential hypertension   Type 2 diabetes mellitus without complication, without long-term current use of insulin (HCC)   Morbid obesity with BMI of  45.0-49.9, adult ([place])   Chronic diastolic CHF (congestive heart failure) (HCC)   Endometrial cancer (HCC)   COPD with acute bronchitis (HCC)   Vertigo   Dehydration   AKI (acute kidney injury) ([place])   Cerebellar stroke ([place])   Metastatic carcinoma ([place])   69 y.o. female with medical history significant of Bladder cancer s/p transurethral resection of bladder tumorwith secondary malignant neoplasm of liver, stroke, congestive heart failure, atrial fibrillation not on anticoagultion, diabetes, hypertension, chronic pain.Acquired thrombophilia endometrial mass, morbid obesity frequent falls, COPD, indwelling catheter, mediastinal mass admitted for AKI, dehydration, dizziness.  Dizziness/Vertigo- Has some vertigo sx , also with stroke on Mri Improving slowly We will increase meclizine to 25  mg twice daily MRI with acute/early subacute stroke, see full report No evidence of brain mets  Acute /early subacute infarct-  On MRI found with acute/early subacute infarct within the left frontoparietal subcortical white matter and an acute infarct within the right cerebellar hemisphere, please see full MRI result  Neurology following  Stroke likely due to her A. fib as patient previously was not on anticoagulation due to hematuria prior to her surgery  Started on Eliquis cleared by oncology Target fasting lipid panel less than 70 Carotid Doppler no significant stenosis PT recommends SNF with hospice   . 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[place]) widely metastatic - ocology consulted, input appreciated.  No plans for systemic treatment and should continue with hospice services Palliative consulted, needs rehab with palliative care and once goes back home needs hospice care  . Essential hypertension - Stable, continue with Cardizem and beta-blockers stable, continue with Cardizem and beta-blocker   . Endometrial cancer [facility]) - Oncology as above   . Chronic diastolic CHF (congestive heart failure) (HCC) - No acute exacerbation Diuretics on hold due to patient being on dry side Continue monitoring fluid status and resume Lasix if needed   . Atrial fibrillation with RVR (HCC) - Heart rate improving at times still has rapid response  Continue Cardizem and beta-blockers  Unable to titrate Cardizem due to blood pressure  If HR still goes fast at times, may consider digoxin , now that renal function better   . COPD with hx/o bronchitis ([place]) -without exacerbation, continue home meds   . Dehydration -was rehydrated Resolved Recommend hydration p.o.  . AKI (acute kidney injury) ([place]) - - likely secondary to dehydration Improved, creatinine 1.10 with IV fluids Avoid nephrotoxic medications 10/16-ivf was d/c. Encouraged po intake    UTi - pt with indwelling catheter, unsure  if true infection vs colonization Urine culture with mixed species needs recollection 10/16-continue with rocephin  DVT prophylaxis: Eliquis Code Status:dnr Family Communication: none at bedside  Status is: inpatient  Patient remains as inpatient due to severity of illness requiring hospitalizatoin.   Dispo: The patient is from: Home              Anticipated d/c is to: SNF              Anticipated d/c date is: 2              Patient currently is not medically stable as need heart better controlled and sx improve more. Also needs placement.          LOS: 1 day   Time spent: 35 min with >50% on coc    Nolberto Hanlon, MD [facility] Pager [phone]  If 7PM-7AM, please contact night-coverage [email or web address] Password [facility] 02/17/2020, 8:58 AM Patient ID: Brittney Shepard,  female   DOB: [birth year], 69 y.o.   MRN: [id number]

## 2020-02-18 DIAGNOSIS — I4891 Unspecified atrial fibrillation: Secondary | ICD-10-CM | POA: Diagnosis not present

## 2020-02-18 DIAGNOSIS — I5032 Chronic diastolic (congestive) heart failure: Secondary | ICD-10-CM | POA: Diagnosis not present

## 2020-02-18 DIAGNOSIS — N179 Acute kidney failure, unspecified: Secondary | ICD-10-CM | POA: Diagnosis not present

## 2020-02-18 DIAGNOSIS — J440 Chronic obstructive pulmonary disease with acute lower respiratory infection: Secondary | ICD-10-CM | POA: Diagnosis not present

## 2020-02-18 LAB — CBC
HCT: 36.3 % (ref 36.0–46.0)
Hemoglobin: 11.3 g/dL — ABNORMAL LOW (ref 12.0–15.0)
MCH: 26.9 pg (ref 26.0–34.0)
MCHC: 31.1 g/dL (ref 30.0–36.0)
MCV: 86.4 fL (ref 80.0–100.0)
Platelets: 341 10*3/uL (ref 150–400)
RBC: 4.2 MIL/uL (ref 3.87–5.11)
RDW: 16 % — ABNORMAL HIGH (ref 11.5–15.5)
WBC: 10.8 10*3/uL — ABNORMAL HIGH (ref 4.0–10.5)
nRBC: 0 % (ref 0.0–0.2)

## 2020-02-18 LAB — GLUCOSE, CAPILLARY
Glucose-Capillary: 110 mg/dL — ABNORMAL HIGH (ref 70–99)
Glucose-Capillary: 114 mg/dL — ABNORMAL HIGH (ref 70–99)
Glucose-Capillary: 119 mg/dL — ABNORMAL HIGH (ref 70–99)
Glucose-Capillary: 122 mg/dL — ABNORMAL HIGH (ref 70–99)
Glucose-Capillary: 149 mg/dL — ABNORMAL HIGH (ref 70–99)
Glucose-Capillary: 151 mg/dL — ABNORMAL HIGH (ref 70–99)
Glucose-Capillary: 163 mg/dL — ABNORMAL HIGH (ref 70–99)

## 2020-02-18 LAB — BRAIN NATRIURETIC PEPTIDE: B Natriuretic Peptide: 380.9 pg/mL — ABNORMAL HIGH (ref 0.0–100.0)

## 2020-02-18 MED ORDER — DIGOXIN 0.25 MG/ML IJ SOLN
0.2500 mg | Freq: Once | INTRAMUSCULAR | Status: DC
  Filled 2020-02-18: qty 2

## 2020-02-18 MED ORDER — DILTIAZEM HCL-DEXTROSE 125-5 MG/125ML-% IV SOLN (PREMIX)
5.0000 mg/h | INTRAVENOUS | Status: DC
  Administered 2020-02-18 – 2020-02-19 (×2): 5 mg/h via INTRAVENOUS
  Administered 2020-02-19: 12.5 mg/h via INTRAVENOUS
  Administered 2020-02-19 – 2020-02-20 (×2): 10 mg/h via INTRAVENOUS
  Administered 2020-02-21: 5 mg/h via INTRAVENOUS
  Filled 2020-02-18 (×7): qty 125

## 2020-02-18 MED ORDER — SODIUM CHLORIDE 0.9 % IV BOLUS
500.0000 mL | Freq: Once | INTRAVENOUS | Status: AC
  Administered 2020-02-18: 500 mL via INTRAVENOUS

## 2020-02-18 MED ORDER — DIGOXIN 0.25 MG/ML IJ SOLN
0.2500 mg | Freq: Once | INTRAMUSCULAR | Status: AC
  Administered 2020-02-18: 0.25 mg via INTRAVENOUS
  Filled 2020-02-18: qty 2

## 2020-02-18 NOTE — Progress Notes (Signed)
   02/18/20 1121  Assess: MEWS Score  Temp 97.8 F (36.6 C)  BP (!) 116/58  Pulse Rate (!) 135  ECG Heart Rate (!) 132  Resp (!) 27  Level of Consciousness Alert  SpO2 95 %  O2 Device Room Air  Assess: MEWS Score  MEWS Temp 0  MEWS Systolic 0  MEWS Pulse 3  MEWS RR 2  MEWS LOC 0  MEWS Score 5  MEWS Score Color Red  Assess: if the MEWS score is Yellow or Red  Were vital signs taken at a resting state? Yes  Focused Assessment Change from prior assessment (see assessment flowsheet) (transfewr from 1A)  Early Detection of Sepsis Score *See Row Information* Low  MEWS guidelines implemented *See Row Information* Yes  Treat  MEWS Interventions Administered scheduled meds/treatments  Pain Scale 0-10  Pain Score 0  Take Vital Signs  Increase Vital Sign Frequency  Red: Q 1hr X 4 then Q 4hr X 4, if remains red, continue Q 4hrs  Escalate  MEWS: Escalate Red: discuss with charge nurse/RN and provider, consider discussing with RRT  Notify: Charge Nurse/RN  Name of Charge Nurse/RN Notified Tammy, RN  Date Charge Nurse/RN Notified 02/18/20  Time Charge Nurse/RN Notified 1125  Document  Patient Outcome Other (Comment) (patient transfer from 1A. Will monitor. )

## 2020-02-18 NOTE — Progress Notes (Signed)
PT Cancellation Note  Patient Details Name: Lequisha Cammack MRN: [id number] DOB: 1950-11-27   Cancelled Treatment:    Reason Eval/Treat Not Completed: Medical issues which prohibited therapy   Chart reviewed and telemetry monitored this am.  HR fluctuating 130-180's at times.  Will hold session this AM and continue as appropriate.   Zora Glendenning 02/18/2020, 10:12 AM

## 2020-02-18 NOTE — Progress Notes (Signed)
PROGRESS NOTE    Nikie Cid  LTJ:[id number] DOB: 10-07-1950 DOA: 02/14/2020 PCP: Marguerita Merles, MD    Brief Narrative:  Brittney Shepard is a 69 y.o. female with medical history significant of Bladder cancer s/p transurethral resection of bladder tumorwith secondary malignant neoplasm of liver, stroke, congestive heart failure, atrial fibrillation not on anticoagultion, diabetes, hypertension, chronic pain.Acquired thrombophilia endometrial mass, morbid obesity frequent falls, COPD, indwelling catheter, mediastinal mass Presented with   feeling dizziness with movement for past 1 day and had an episode of vomiting. Describes vertigo-like sensation which is worse when she is trying to move.  10/15- had afib rvr on tele.  10/16- HR improved some.  10/17: overnight with afib RVR, given IV digoxin x2 and fluid bolus by night team  Consultants:   Oncology, neurology  Procedures:  CT head Remote infarcts. Senescent changes. No evidence of acute intracranial hemorrhage or infarct.    Antimicrobials:       Subjective: Patient feels little lightheaded but no dizziness.  Denies shortness of breath or chest pain.  On telemetry heart rate in the one forties  Objective: Vitals:   02/18/20 1245 02/18/20 1300 02/18/20 1315 02/18/20 1330  BP: 98/74 (!) 108/58 130/67 (!) 98/51  Pulse: (!) 110 78  (!) 106  Resp: (!) 24 (!) 28 (!) 21 20  Temp:    97.9 F (36.6 C)  TempSrc:    Oral  SpO2: 97% 97% 98% 97%  Weight:      Height:        Intake/Output Summary (Last 24 hours) at 02/18/2020 1356 Last data filed at 02/18/2020 1012 Gross per 24 hour  Intake 1215.07 ml  Output 325 ml  Net 890.07 ml   Filed Weights   02/15/20 1700  Weight: 118.3 kg    Examination: Nontachypneic,, eating breakfast  Poor respiratory effort however clear to auscultation no wheezing  Irregular, tachy, S1-S2 no murmurs rubs gallops  Soft benign positive bowel sounds  SCDs in place  Alert  oriented x3  Mood and affect appropriate in current setting       Data Reviewed: I have personally reviewed following labs and imaging studies  CBC: Recent Labs  Lab 02/14/20 1949 02/15/20 0501 02/18/20 0438  WBC 16.0* 10.7* 10.8*  NEUTROABS  --  8.9*  --   HGB 12.3 11.2* 11.3*  HCT 39.6 35.2* 36.3  MCV 86.5 87.6 86.4  PLT 430* 326 076   Basic Metabolic Panel: Recent Labs  Lab 02/14/20 1949 02/15/20 0501 02/16/20 0458  NA 135 137 141  K 4.7 4.6 4.1  CL 102 106 108  CO2 20* 22 26  GLUCOSE 209* 152* 101*  BUN 21 20 16   CREATININE 1.54* 1.31* 1.10*  CALCIUM 8.0* 7.6* 8.0*  MG  --  2.0  --   PHOS  --  3.8  --    GFR: Estimated Creatinine Clearance: 55.2 mL/min (A) (by C-G formula based on SCr of 1.1 mg/dL (H)). Liver Function Tests: Recent Labs  Lab 02/15/20 0501  AST 13*  ALT 7  ALKPHOS 64  BILITOT 1.0  PROT 6.5  ALBUMIN 2.8*   No results for input(s): LIPASE, AMYLASE in the last 168 hours. No results for input(s): AMMONIA in the last 168 hours. Coagulation Profile: No results for input(s): INR, PROTIME in the last 168 hours. Cardiac Enzymes: No results for input(s): CKTOTAL, CKMB, CKMBINDEX, TROPONINI in the last 168 hours. BNP (last 3 results) No results for input(s): PROBNP in the  last 8760 hours. HbA1C: No results for input(s): HGBA1C in the last 72 hours. CBG: Recent Labs  Lab 02/17/20 2019 02/18/20 0022 02/18/20 0407 02/18/20 0752 02/18/20 1134  GLUCAP 137* 110* 114* 122* 151*   Lipid Profile: Recent Labs    02/16/20 0458  CHOL 144  HDL 22*  LDLCALC 90  TRIG 160*  CHOLHDL 6.5   Thyroid Function Tests: No results for input(s): TSH, T4TOTAL, FREET4, T3FREE, THYROIDAB in the last 72 hours. Anemia Panel: No results for input(s): VITAMINB12, FOLATE, FERRITIN, TIBC, IRON, RETICCTPCT in the last 72 hours. Sepsis Labs: No results for input(s): PROCALCITON, LATICACIDVEN in the last 168 hours.  Recent Results (from the past 240  hour(s))  Urine culture     Status: Abnormal   Collection Time: 02/14/20  7:49 PM   Specimen: Urine, Random  Result Value Ref Range Status   Specimen Description   Final    URINE, RANDOM Performed at [facility], [address], [place], [place] [address]    Special Requests   Final    NONE Performed at [facility], [address] [place], [place] [address]    Culture MULTIPLE SPECIES PRESENT, SUGGEST RECOLLECTION (A)  Final   Report Status 02/16/2020 FINAL  Final  Respiratory Panel by RT PCR (Flu A&B, Covid) - Nasopharyngeal Swab     Status: None   Collection Time: 02/14/20  9:45 PM   Specimen: Nasopharyngeal Swab  Result Value Ref Range Status   SARS Coronavirus 2 by RT PCR NEGATIVE NEGATIVE Final    Comment: (NOTE) SARS-CoV-2 target nucleic acids are NOT DETECTED.  The SARS-CoV-2 RNA is generally detectable in upper respiratoy specimens during the acute phase of infection. The lowest concentration of SARS-CoV-2 viral copies this assay can detect is 131 copies/mL. A negative result does not preclude SARS-Cov-2 infection and should not be used as the sole basis for treatment or other patient management decisions. A negative result may occur with  improper specimen collection/handling, submission of specimen other than nasopharyngeal swab, presence of viral mutation(s) within the areas targeted by this assay, and inadequate number of viral copies (<131 copies/mL). A negative result must be combined with clinical observations, patient history, and epidemiological information. The expected result is Negative.  Fact Sheet for Patients:  PinkCheek.be  Fact Sheet for Healthcare Providers:  GravelBags.it  This test is no t yet approved or cleared by the [place] FDA and  has been authorized for detection and/or diagnosis of SARS-CoV-2 by FDA under an Emergency Use Authorization (EUA). This EUA  will remain  in effect (meaning this test can be used) for the duration of the COVID-19 declaration under Section 564(b)(1) of the Act, 21 U.S.C. section 360bbb-3(b)(1), unless the authorization is terminated or revoked sooner.     Influenza A by PCR NEGATIVE NEGATIVE Final   Influenza B by PCR NEGATIVE NEGATIVE Final    Comment: (NOTE) The Xpert Xpress SARS-CoV-2/FLU/RSV assay is intended as an aid in  the diagnosis of influenza from Nasopharyngeal swab specimens and  should not be used as a sole basis for treatment. Nasal washings and  aspirates are unacceptable for Xpert Xpress SARS-CoV-2/FLU/RSV  testing.  Fact Sheet for Patients: PinkCheek.be  Fact Sheet for Healthcare Providers: GravelBags.it  This test is not yet approved or cleared by the [place] FDA and  has been authorized for detection and/or diagnosis of SARS-CoV-2 by  FDA under an Emergency Use Authorization (EUA). This EUA will remain  in effect (meaning this  test can be used) for the duration of the  Covid-19 declaration under Section 564(b)(1) of the Act, 21  U.S.C. section 360bbb-3(b)(1), unless the authorization is  terminated or revoked. Performed at [facility], [address], [place], [place] [address]          Radiology Studies: US Carotid Bilateral  Result Date: 02/17/2020 CLINICAL DATA:  69 year old female with history of stroke-like symptoms. EXAM: BILATERAL CAROTID DUPLEX ULTRASOUND TECHNIQUE: Pearline Cables scale imaging, color Doppler and duplex ultrasound were performed of bilateral carotid and vertebral arteries in the neck. COMPARISON:  None. FINDINGS: Criteria: Quantification of carotid stenosis is based on velocity parameters that correlate the residual internal carotid diameter with NASCET-based stenosis levels, using the diameter of the distal internal carotid lumen as the denominator for stenosis measurement. The following  velocity measurements were obtained: RIGHT ICA: Peak systolic velocity 43 cm/sec, End diastolic velocity 9 cm/sec CCA: Peak systolic velocity 72 cm/sec SYSTOLIC ICA/CCA RATIO:  0.6 ECA: Peak systolic velocity 76 cm/sec LEFT ICA: Peak systolic velocity 49 cm/sec, End diastolic velocity 8 cm/sec CCA: 83 cm/sec SYSTOLIC ICA/CCA RATIO:  0.6 ECA: 62 cm/sec RIGHT CAROTID ARTERY: No atherosclerotic plaque formation. No significant tortuosity. Normal low resistance waveforms. RIGHT VERTEBRAL ARTERY:  Antegrade flow. LEFT CAROTID ARTERY: No atherosclerotic plaque formation. No significant tortuosity. Normal low resistance waveforms. LEFT VERTEBRAL ARTERY:  Antegrade flow. Upper extremity non-invasive blood pressures: Not obtained. IMPRESSION: 1. Right carotid artery system: Patent without significant atherosclerotic plaque formation. 2. Left carotid artery system: Patent without significant atherosclerotic plaque formation. 3.  Vertebral artery system: Patent with antegrade flow bilaterally. Ruthann Cancer, MD [facility] Electronically Signed   By: Ruthann Cancer MD   On: 02/17/2020 10:42        Scheduled Meds: . apixaban  5 mg Oral BID  . Chlorhexidine Gluconate Cloth  6 each Topical Daily  . darifenacin  7.5 mg Oral Daily  . influenza vaccine adjuvanted  0.5 mL Intramuscular Tomorrow-1000  . insulin aspart  0-9 Units Subcutaneous Q4H  . meclizine  25 mg Oral BID   Continuous Infusions: . sodium chloride 10 mL/hr at 02/18/20 0516  . cefTRIAXone (ROCEPHIN)  IV Stopped (02/17/20 2121)  . diltiazem (CARDIZEM) infusion 10 mg/hr (02/18/20 1158)    Assessment & Plan:   Active Problems:   Atrial fibrillation with RVR (HCC)   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HCC)   Essential hypertension   Type 2 diabetes mellitus without complication, without long-term current use of insulin (HCC)   Morbid obesity with BMI of 45.0-49.9, adult (HCC)   Chronic  diastolic CHF (congestive heart failure) (HCC)   Endometrial cancer (HCC)   COPD with acute bronchitis (HCC)   Vertigo   Dehydration   AKI (acute kidney injury) ([place])   Cerebellar stroke ([place])   Metastatic carcinoma ([place])   69 y.o. female with medical history significant of Bladder cancer s/p transurethral resection of bladder tumorwith secondary malignant neoplasm of liver, stroke, congestive heart failure, atrial fibrillation not on anticoagultion, diabetes, hypertension, chronic pain.Acquired thrombophilia endometrial mass, morbid obesity frequent falls, COPD, indwelling catheter, mediastinal mass admitted for AKI, dehydration, dizziness.Went into afib rvr.  Dizziness/Vertigo- Has some vertigo sx , also with stroke on Mri 10/17- now with some lightheadedness likely due to A. fib RVR  We will continue on meclizine  MRI with acute/early subacute stroke, please see full report.  No evidence of brain mets  We will need to rate control her rhythm  Acute /early subacute infarct-  On MRI found with acute/early subacute infarct within the left frontoparietal subcortical white matter and an acute infarct within the right cerebellar hemisphere, please see full MRI result  Neurology following  -Shock likely due to A. fib as patient previously was not anticoagulated due to hematuria prior to her surgery  Started on Eliquis which was cleared by oncology  Carotid Doppler no significant stenosis  Target fasting less than 70  PT recommends SNF with hospice     . 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[place]) widely metastatic - ocology consulted, input appreciated.  No plans for systemic treatment and should continue hospice services  Palliative consulted, needs rehab with palliative care and once goes back home needs hospice care    . Essential hypertension - Stable with cardizem and beta blk, however now need to place on cardizem gtt due to afib rvr, will dc po meds   . Endometrial cancer  [facility]) - Oncology as above   . Chronic diastolic CHF (congestive heart failure) (HCC) - No acute exacerbation Continue to hold diuretics for now.  Initially was stopped due to patient being on dry side  May need diuretics as needed if blood pressure stable   . Atrial fibrillation with RVR (HCC) - 10/17rate not controlled overnight, and IV dig x2 still rate not controlled On telemetry heart rate in the one forties DC p.o. Cardizem and beta-blockers start Cardizem drip Continue anticoagulation Will transfer to PCU for Cardizem drip  . COPD with hx/o bronchitis ([place]) -currently without exacerbation Avoid neb treatment currently due to A. fib RVR    . Dehydration -was rehydrated Resolved Recommend hydration p.o.  . AKI (acute kidney injury) ([place]) - - likely secondary to dehydration Resolved with IV fluids Creatinine 1.10 Continue to monitor levels   UTi - pt with indwelling catheter, unsure if true infection vs colonization Urine culture with mixed species needs recollection 10/17 continue on Rocephin today and can DC tomorrow  DVT prophylaxis: Eliquis Code Status:dnr Family Communication: none at bedside  Status is: inpatient  Patient remains as inpatient due to severity of illness requiring hospitalizatoin.   Dispo: The patient is from: Home              Anticipated d/c is to: SNF              Anticipated d/c date is: 2              Patient currently is not medically stable as need heart better controlled.  Also SNF pending          LOS: 2 days   Time spent: 45 min with >50% on coc    Nolberto Hanlon, MD [facility] Pager [phone]  If 7PM-7AM, please contact night-coverage [email or web address] Password [email or web address] 02/18/2020, 1:56 PM

## 2020-02-18 NOTE — Progress Notes (Signed)
Pt HR has been sustaining >130 for >10 minutes. MD notified and new orders placed and administered. Pt BP has been soft during shift. Pt has received a total of 0.5mg  digoxin and 1L bolus. Pt HR is still above 120s and MD was notified. No new orders placed at this time. Oncoming nurse is aware.

## 2020-02-18 NOTE — Progress Notes (Signed)
Night coverage note  Patient with rapid A. fib rate 130s to 140s during the night with soft blood pressures.  Received digoxin 0.25 mg every 12 hours x2 doses, last dose at 5 AM along with IV fluid bolus of 1 L.

## 2020-02-19 DIAGNOSIS — I4891 Unspecified atrial fibrillation: Secondary | ICD-10-CM | POA: Diagnosis not present

## 2020-02-19 DIAGNOSIS — I5033 Acute on chronic diastolic (congestive) heart failure: Secondary | ICD-10-CM

## 2020-02-19 DIAGNOSIS — Z515 Encounter for palliative care: Secondary | ICD-10-CM | POA: Diagnosis not present

## 2020-02-19 DIAGNOSIS — J440 Chronic obstructive pulmonary disease with acute lower respiratory infection: Secondary | ICD-10-CM | POA: Diagnosis not present

## 2020-02-19 DIAGNOSIS — N179 Acute kidney failure, unspecified: Secondary | ICD-10-CM | POA: Diagnosis not present

## 2020-02-19 LAB — GLUCOSE, CAPILLARY
Glucose-Capillary: 113 mg/dL — ABNORMAL HIGH (ref 70–99)
Glucose-Capillary: 122 mg/dL — ABNORMAL HIGH (ref 70–99)
Glucose-Capillary: 135 mg/dL — ABNORMAL HIGH (ref 70–99)
Glucose-Capillary: 124 mg/dL — ABNORMAL HIGH (ref 70–99)
Glucose-Capillary: 115 mg/dL — ABNORMAL HIGH (ref 70–99)

## 2020-02-19 LAB — CBC
HCT: 34.6 % — ABNORMAL LOW (ref 36.0–46.0)
Hemoglobin: 10.6 g/dL — ABNORMAL LOW (ref 12.0–15.0)
MCH: 26.9 pg (ref 26.0–34.0)
MCHC: 30.6 g/dL (ref 30.0–36.0)
MCV: 87.8 fL (ref 80.0–100.0)
Platelets: 294 10*3/uL (ref 150–400)
RBC: 3.94 MIL/uL (ref 3.87–5.11)
RDW: 15.9 % — ABNORMAL HIGH (ref 11.5–15.5)
WBC: 10.6 10*3/uL — ABNORMAL HIGH (ref 4.0–10.5)
nRBC: 0 % (ref 0.0–0.2)

## 2020-02-19 LAB — BASIC METABOLIC PANEL
Anion gap: 8 (ref 5–15)
BUN: 11 mg/dL (ref 8–23)
CO2: 23 mmol/L (ref 22–32)
Calcium: 8 mg/dL — ABNORMAL LOW (ref 8.9–10.3)
Chloride: 106 mmol/L (ref 98–111)
Creatinine, Ser: 0.91 mg/dL (ref 0.44–1.00)
GFR, Estimated: >60 mL/min (ref >60)
Glucose, Bld: 92 mg/dL (ref 70–99)
Potassium: 4.1 mmol/L (ref 3.5–5.1)
Sodium: 137 mmol/L (ref 135–145)

## 2020-02-19 MED ORDER — DILTIAZEM HCL 30 MG PO TABS
30.0000 mg | ORAL_TABLET | Freq: Four times a day (QID) | ORAL | Status: DC
  Administered 2020-02-19 – 2020-02-21 (×8): 30 mg via ORAL
  Filled 2020-02-19 (×8): qty 1

## 2020-02-19 MED ORDER — FUROSEMIDE 10 MG/ML IJ SOLN
20.0000 mg | Freq: Once | INTRAMUSCULAR | Status: AC
  Administered 2020-02-19: 20 mg via INTRAVENOUS
  Filled 2020-02-19: qty 2

## 2020-02-19 NOTE — Progress Notes (Signed)
Attempting to wean down patient off cardizem gtt.  Currently receiving both PO cardizem and IV cardizem.

## 2020-02-19 NOTE — Progress Notes (Signed)
Occupational Therapy Treatment Patient Details Name: Brittney Shepard MRN: [id number] DOB: Jan 18, 1951 Today's Date: 02/19/2020    History of present illness 69 y.o. female with medical history significant of Bladder cancer s/p transurethral resection of bladder tumor  with secondary malignant neoplasm of liver, stroke, congestive heart failure, atrial fibrillation not on anticoagultion, diabetes, hypertension, chronic pain.Acquired thrombophilia  endometrial mass, morbid obesity frequent falls, COPD, indwelling catheter, mediastinal mass.  Here with weakness, dizziness.   OT comments  Pt seen for OT treatment on this date. Upon arrival to room pt awake but lethargic, A&O x 4 reporting at present is having no pain, no nausea, and no dizziness. Pt reluctant to engage in any mobility task. With repeated encouragement and VCs, pt participates in bed mobility with Max A, unable to come to EOB sitting. Participated in bed-level therex, max of 10 reps per movement before saying she is too tired to continue. Pt educated re: importance of OOB movement, states she would like to try later this week. Pt expresses thirst/dry mouth, and is able to manage her cup/straw INDly. Pt can continue to benefit from skilled OT services to increase strength and endurance. Discharge recommendation remains appropriate.    Follow Up Recommendations  SNF;Other (comment)    Equipment Recommendations       Recommendations for Other Services      Precautions / Restrictions Precautions Precautions: Fall Restrictions Weight Bearing Restrictions: No       Mobility Bed Mobility Overal bed mobility: Needs Assistance Bed Mobility: Rolling Rolling: Max assist         General bed mobility comments: Pt declined to come to EOB sitting. Heavy assistance required for logrolling in bed  Transfers                      Balance Overall balance assessment: Needs assistance   Sitting balance-Leahy Scale: Zero        Standing balance-Leahy Scale: Zero                             ADL either performed or assessed with clinical judgement   ADL Overall ADL's : Needs assistance/impaired Eating/Feeding: Set up;Supervision/ safety                                     General ADL Comments: Pt declined to perform grooming or other ADL, 2/2 fatigue     Vision Patient Visual Report: No change from baseline     Perception     Praxis      Cognition Arousal/Alertness: Awake/alert Behavior During Therapy: Flat affect;WFL for tasks assessed/performed Overall Cognitive Status: No family/caregiver present to determine baseline cognitive functioning                                          Exercises Low Level/ICU Exercises Ankle Circles/Pumps: AROM;10 reps Elbow Flexion: AROM;10 reps Other Exercises Other Exercises: Educ re: role of OT, importance of OOB activity, DC. Engaged in bed-level therex: fist pumps, arm raises, ankle pumps, elbow flexion.   Shoulder Instructions       General Comments Pt states she has neither dizziness nor pain today, but is very fatigued, unengaged    Pertinent Vitals/ Pain       Pain  Assessment: No/denies pain Pain Location: States having no pain today  Home Living                                          Prior Functioning/Environment              Frequency  Min 1X/week        Progress Toward Goals  OT Goals(current goals can now be found in the care plan section)  Progress towards OT goals: Progressing toward goals  Acute Rehab OT Goals Patient Stated Goal: get rid of this nausea OT Goal Formulation: With patient Time For Goal Achievement: 02/29/20 Potential to Achieve Goals: [place] Discharge plan remains appropriate    Co-evaluation                 AM-PAC OT "6 Clicks" Daily Activity     Outcome Measure   Help from another person eating meals?: None Help from  another person taking care of personal grooming?: A Little Help from another person toileting, which includes using toliet, bedpan, or urinal?: A Lot Help from another person bathing (including washing, rinsing, drying)?: A Lot Help from another person to put on and taking off regular upper body clothing?: A Lot Help from another person to put on and taking off regular lower body clothing?: Total 6 Click Score: 14    End of Session    OT Visit Diagnosis: Unsteadiness on feet (R26.81);Muscle weakness (generalized) (M62.81)   Activity Tolerance Patient limited by fatigue;Other (comment)   Patient Left in bed;with call bell/phone within reach   Nurse Communication          Time: 1683-7290 OT Time Calculation (min): 12 min  Charges: OT General Charges $OT Visit: 1 Visit OT Treatments $Self Care/Home Management : 8-22 mins  Josiah Lobo, PhD, [place], OTR/L ascom [phone] 02/19/20, 2:05 PM

## 2020-02-19 NOTE — Progress Notes (Signed)
Physical Therapy Discharge Patient Details Name: Brittney Shepard MRN: [id number] DOB: [birth year] Today's Date: 02/19/2020 Time:  -     Patient discharged from PT services secondary to transfer to higher level of care 1A -> PCU and no cont on transfer orders.  Please see latest therapy progress note for current level of functioning and progress toward goals.     [name] 02/19/2020, 8:10 AM

## 2020-02-19 NOTE — Plan of Care (Signed)
Cardizem gtt titrated to 12.5 mg/hr for wide range in HR values over night. All other vitals stable. Pt has no acute complaints or changes.  Problem: Education: Goal: Knowledge of General Education information will improve Description: Including pain rating scale, medication(s)/side effects and non-pharmacologic comfort measures Outcome: Progressing   Problem: Activity: Goal: Risk for activity intolerance will decrease Outcome: Progressing   Problem: Pain Managment: Goal: General experience of comfort will improve Outcome: Progressing   Problem: Safety: Goal: Ability to remain free from injury will improve Outcome: Progressing   Problem: Skin Integrity: Goal: Risk for impaired skin integrity will decrease Outcome: Progressing

## 2020-02-19 NOTE — Progress Notes (Signed)
[place]  Telephone:([phone] [phone]   Name: Brittney Shepard Date: 02/19/2020 MRN: [id number]  DOB: [birth year]  Patient Care Team: Marguerita Merles, MD as PCP - General (Family Medicine) Kate Sable, MD as PCP - Cardiology (Cardiology) Clent Jacks, RN as Oncology Nurse Navigator    REASON FOR CONSULTATION: Brittney Shepard Terryis a 69 y.o.femalewith multiple medical problems including history of CHF, COPD, A. fib not on anticoagulation, history of CVA,and morbid obesity,who was incidentally found to have a large bladder mass on imaging following a fall.Patient subsequently underwent TURBT on 11/20/2019 with ureteral stent placement by Dr. Diamantina Providence.Pathology revealed a high-grade urothelial carcinoma. Patient also underwent biopsy of a liver mass on 11/22/2019 with findings consistent for poorly differentiated carcinoma.She is found to have PET positive endometrial mass with peritoneal adenopathy concerning for metastatic endometrial cancer. Patient was not felt to be a surgical candidate for her stage IV cancer.Patient was hospitalized 12/22/2019 -8/25/2021with rapid A. fib and UTI.   Patient ultimately discharged home from rehab following her hospitalization.  She remained severely weak.  She had follow-up in the cancer center and was felt not to be a viable candidate for systemic treatment.  Patient and son opted instead to pursue hospice at home.  Patient was admitted to the hospital on 02/14/2020 from home with nausea and vertigo.  Patient was found to have acute right cerebellar and left frontal parietal CVAs on MRI.  Palliative care was consulted help address goals.   CODE STATUS: DNR  PAST MEDICAL HISTORY: Past Medical History:  Diagnosis Date  . A-fib ([address])   . Bladder cancer ([place])   . CHF (congestive heart failure) ([place])   . Chronic pain   . Cognitive communication deficit   . Diabetes  mellitus without complication ([place])   . Hypertension   . Hypokalemia   . Muscle weakness (generalized)   . 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 Secondary malignant neoplasm of liver ([place])   . Stroke [facility]) 2013  . Urinary tract infection, site not specified     PAST SURGICAL HISTORY:  Past Surgical History:  Procedure Laterality Date  . CYSTOSCOPY W/ RETROGRADES N/A 11/20/2019   Procedure: CYSTOSCOPY WITH RETROGRADE PYELOGRAM;  Surgeon: Billey Co, MD;  Location: [facility] ORS;  Service: Urology;  Laterality: N/A;  . CYSTOSCOPY WITH STENT PLACEMENT Left 11/20/2019   Procedure: CYSTOSCOPY WITH STENT PLACEMENT;  Surgeon: Billey Co, MD;  Location: [facility] ORS;  Service: Urology;  Laterality: Left;  . TRANSURETHRAL RESECTION OF BLADDER TUMOR N/A 11/20/2019   Procedure: TRANSURETHRAL RESECTION OF BLADDER TUMOR (TURBT);  Surgeon: Billey Co, MD;  Location: [facility] ORS;  Service: Urology;  Laterality: N/A;    HEMATOLOGY/ONCOLOGY HISTORY:  Oncology History Overview Note  # 2021- Liver bx-poorly differentiated CK 7+ carcinoma- DIAGNOSIS:  A. LIVER; ULTRASOUND-GUIDED CORE NEEDLE BIOPSY:  - POSITIVE FOR MALIGNANCY.  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   Comment:  Immunohistochemical studies show tumor cells to be positive for CK7 and  CK19, with focal faint expression of CD20. Tumor cells are negative for  CDX-2, GATA-3, TTF-1, and Pax-8. Additional stains were attempted, but  malignant tissue is exhausted in the core utilized for these studies.   The immunohistochemical pattern is relatively non-specific. The  patient's recent diagnosis of invasive urothelial carcinoma is noted,  but the histologic pattern does not appear similar, and lack of staining  for GATA3 would suggest against this. Concern for a  gynecologic  malignancy is also noted, but lack of marking for Pax-8 would suggest  against this. CK7 positivity may be seen in carcinomas arising in the   upper GI tract, lung, pancreatobiliary tree, and thymus, among other  sites. Lack of marking for TTF-1 would suggest against lung. The  presence of CK19 staining, while not specific, may be seen in carcinomas  arising from the pancreatobiliary tree, as well as urinary bladder,  endometrium, lung, and kidney. Recent laboratory testing indicates liver  function testing within normal limits. Clinical and radiographic  correlation is recommended.   # JULY 2021- cystocopy- DIAGNOSIS: A. 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  - NO MUSCULARIS PROPRIA IDENTIFIED.   B. 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  - MUSCULARIS PROPRIA PRESENT AND UNINVOLVED.   # ENDOMETRIAL MASS [Dr.Staebler]- unable to Bx- sec to cervical stenosis  # Mediastinal mass  Lobulated anterior mediastinal 4.1 x 3.6 cm soft tissue mass, indeterminate for nodal metastasis versus primary thymic neoplasm. No additional potential findings of metastatic disease in the chest. 3. Mild cardiomegaly. Three-vessel coronary atherosclerosis. 4. Left adrenal myelolipoma. 5. Exophytic isodense 1.3 cm renal cortical lesion in the lateral upper right kidney  # CHF-compensated; morbid obesity; diabetes; COPD [not on O2]; A. Fib- not on anticoagulation.    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[place])  11/22/2019 Initial Diagnosis   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[place])   12/04/2019 -  Chemotherapy   The patient had dexamethasone (DECADRON) 4 MG tablet, 8 mg, Oral, Daily, 0 of 1 cycle, Start date: --, End date: -- palonosetron (ALOXI) injection 0.25 mg, 0.25 mg, Intravenous,  Once, 0 of 6 cycles pegfilgrastim (NEULASTA ONPRO KIT) injection 6 mg, 6 mg, Subcutaneous, Once, 0 of 6 cycles CARBOplatin (PARAPLATIN) in  sodium chloride 0.9 % 100 mL chemo infusion, , Intravenous,  Once, 0 of 6 cycles fosaprepitant (EMEND) 150 mg in sodium chloride 0.9 % 145 mL IVPB, 150 mg, Intravenous,  Once, 0 of 6 cycles PACLitaxel (TAXOL) 558 mg in sodium chloride 0.9 % 500 mL chemo infusion (> 34m/m2), 225 mg/m2, Intravenous,  Once, 0 of 6 cycles  for chemotherapy treatment.    [place]   Initial Diagnosis   Liver metastases ([place]   12/04/2019 -  Chemotherapy   The patient had dexamethasone (DECADRON) 4 MG tablet, 8 mg, Oral, Daily, 0 of 1 cycle, Start date: --, End date: -- palonosetron (ALOXI) injection 0.25 mg, 0.25 mg, Intravenous,  Once, 0 of 6 cycles pegfilgrastim (NEULASTA ONPRO KIT) injection 6 mg, 6 mg, Subcutaneous, Once, 0 of 6 cycles CARBOplatin (PARAPLATIN) in sodium chloride 0.9 % 100 mL chemo infusion, , Intravenous,  Once, 0 of 6 cycles fosaprepitant (EMEND) 150 mg in sodium chloride 0.9 % 145 mL IVPB, 150 mg, Intravenous,  Once, 0 of 6 cycles PACLitaxel (TAXOL) 558 mg in sodium chloride 0.9 % 500 mL chemo infusion (> 862mm2), 225 mg/m2, Intravenous,  Once, 0 of 6 cycles  for chemotherapy treatment.      ALLERGIES:  is allergic to penicillin g and sulfa antibiotics.  MEDICATIONS:  Current Facility-Administered Medications  Medication Dose Route Frequency Provider Last Rate Last Admin  . 0.9 %  sodium chloride infusion   Intravenous PRN AmNolberto HanlonMD 10 mL/hr at 02/18/20 0516 Rate Verify at 02/18/20 0516  . acetaminophen (TYLENOL) tablet 650 mg  650 mg Oral Q6H  PRN Toy Baker, MD       Or  . acetaminophen (TYLENOL) suppository 650 mg  650 mg Rectal Q6H PRN Doutova, Anastassia, MD      . apixaban (ELIQUIS) tablet 5 mg  5 mg Oral BID Nolberto Hanlon, MD   5 mg at 02/19/20 0907  . Chlorhexidine Gluconate Cloth 2 % PADS 6 each  6 each Topical Daily Nolberto Hanlon, MD   6 each at 02/18/20 1413  . darifenacin (ENABLEX) 24 hr tablet 7.5 mg  7.5 mg Oral Daily Doutova, Anastassia, MD    7.5 mg at 02/19/20 0907  . diltiazem (CARDIZEM) 125 mg in dextrose 5% 125 mL (1 mg/mL) infusion  5-15 mg/hr Intravenous Titrated Nolberto Hanlon, MD 12.5 mL/hr at 02/19/20 1513 12.5 mg/hr at 02/19/20 1513  . diltiazem (CARDIZEM) tablet 30 mg  30 mg Oral Q6H Nolberto Hanlon, MD   30 mg at 02/19/20 1339  . influenza vaccine adjuvanted (FLUAD) injection 0.5 mL  0.5 mL Intramuscular [place]-1000 Kurtis Bushman, Sahar, MD      . insulin aspart (novoLOG) injection 0-9 Units  0-9 Units Subcutaneous Q4H Toy Baker, MD   1 Units at 02/19/20 1204  . meclizine (ANTIVERT) tablet 25 mg  25 mg Oral BID Nolberto Hanlon, MD   25 mg at 02/19/20 0907  . metoCLOPramide (REGLAN) injection 5 mg  5 mg Intravenous Q6H PRN Toy Baker, MD        VITAL SIGNS: BP 113/76 (BP Location: Right Arm)   Pulse 77   Temp 98.2 F (36.8 C)   Resp (!) 24   Ht 5' (1.524 m)   Wt 266 lb 3.2 oz (120.7 kg)   SpO2 93%   BMI 51.99 kg/m  Filed Weights   02/15/20 1700 02/19/20 0407  Weight: 260 lb 12.9 oz (118.3 kg) 266 lb 3.2 oz (120.7 kg)    Estimated body mass index is 51.99 kg/m as calculated from the following:   Height as of this encounter: 5' (1.524 m).   Weight as of this encounter: 266 lb 3.2 oz (120.7 kg).  LABS: CBC:    Component Value Date/Time   WBC 10.6 (H) 02/19/2020 0248   HGB 10.6 (L) 02/19/2020 0248   HCT 34.6 (L) 02/19/2020 0248   PLT 294 02/19/2020 0248   MCV 87.8 02/19/2020 0248   NEUTROABS 8.9 (H) 02/15/2020 0501   LYMPHSABS 1.1 02/15/2020 0501   MONOABS 0.6 02/15/2020 0501   EOSABS 0.0 02/15/2020 0501   BASOSABS 0.1 02/15/2020 0501   Comprehensive Metabolic Panel:    Component Value Date/Time   NA 137 02/19/2020 0248   K 4.1 02/19/2020 0248   CL 106 02/19/2020 0248   CO2 23 02/19/2020 0248   BUN 11 02/19/2020 0248   CREATININE 0.91 02/19/2020 0248   GLUCOSE 92 02/19/2020 0248   CALCIUM 8.0 (L) 02/19/2020 0248   AST 13 (L) 02/15/2020 0501   ALT 7 02/15/2020 0501   ALKPHOS 64  02/15/2020 0501   BILITOT 1.0 02/15/2020 0501   PROT 6.5 02/15/2020 0501   ALBUMIN 2.8 (L) 02/15/2020 0501    RADIOGRAPHIC STUDIES: CT Head Wo Contrast  Result Date: 02/14/2020 CLINICAL DATA:  Vertigo EXAM: CT HEAD WITHOUT CONTRAST TECHNIQUE: Contiguous axial images were obtained from the base of the skull through the vertex without intravenous contrast. COMPARISON:  03/31/2009 FINDINGS: Brain: Ends level malacia secondary to remote left frontal infarct is present. Remote lacunar infarct noted within the left basal ganglia. Mild parenchymal volume loss is present, commensurate with  the patient's age. Mild periventricular white matter changes are present likely reflecting the sequela of small vessel ischemia. No evidence of acute intracranial hemorrhage or infarct. No abnormal mass effect or midline shift. No abnormal intra or extra-axial mass lesion or fluid collection. The ventricular size is normal. Cerebellum is unremarkable. Vascular: No asymmetric hyperdense vasculature at the skull base. Skull: Intact Sinuses/Orbits: The visualized paranasal sinuses are clear. The visualized orbits are unremarkable. Other: Visualized mastoid air cells and middle ear cavities are clear. IMPRESSION: Remote infarcts. Senescent changes. No evidence of acute intracranial hemorrhage or infarct. Electronically Signed   By: Fidela Salisbury MD   On: 02/14/2020 22:11   MR BRAIN W WO CONTRAST  Addendum Date: 02/15/2020   ADDENDUM REPORT: 02/15/2020 17:00 ADDENDUM: These results were called by telephone at the time of interpretation on 02/15/2020 at 5:00 pm to provider Dr. Nolberto Hanlon, who verbally acknowledged these results. Electronically Signed   By: Kellie Simmering DO   On: 02/15/2020 17:00   Result Date: 02/15/2020 CLINICAL DATA:  Metastatic disease evaluation. EXAM: MRI HEAD WITHOUT AND WITH CONTRAST TECHNIQUE: Multiplanar, multiecho pulse sequences of the brain and surrounding structures were obtained without and with  intravenous contrast. CONTRAST:  7.50m GADAVIST GADOBUTROL 1 MMOL/ML IV SOLN COMPARISON:  Noncontrast head CT 02/14/2020. FINDINGS: Brain: Mild intermittent motion degradation. This includes mild motion degradation of the axial and coronal postcontrast T1 weighted sequences. Mild generalized cerebral atrophy. No abnormal enhancement is demonstrated to suggest intracranial metastatic disease. There is patchy restricted diffusion within the inferomedial right cerebellar hemisphere and right cerebellar tonsil (PICA vascular territory) consistent with acute infarction. This encompasses a region measuring 4.6 x 1.9 cm in transaxial dimensions (series 5, image 11). Corresponding T2/FLAIR hyperintensity at this site. Additional 13 mm focus of diffusion weighted hyperintensity and corresponding intermediate signal on the ADC map consistent with acute/early subacute infarction (series 5, image 34) (series 7, image 15). Redemonstrated chronic left MCA territory cortical/subcortical infarct within the frontal lobe, left insula/subinsular white matter and deep left frontal white matter. Mild chronic blood products are present at this site. Chronic blood products are also present within the left midbrain likely reflecting sequela of prior hemorrhage. Moderate multifocal T2/FLAIR hyperintensity within the cerebral white matter is nonspecific, but consistent with chronic small vessel ischemic disease. No extra-axial fluid collection. No midline shift. Incidentally noted pineal gland cyst without suspicious masslike or nodular enhancement. Vascular: Expected proximal arterial flow voids. Skull and upper cervical spine: No focal suspicious marrow lesion. Sinuses/Orbits: Visualized orbits show no acute finding. Mild ethmoid sinus mucosal thickening. Small right maxillary sinus mucous retention cyst. Trace fluid within bilateral mastoid air cells. IMPRESSION: Mildly motion degraded examination. No evidence of intracranial metastatic  disease. Patchy acute infarcts within the right cerebellar hemisphere (PICA vascular territory) encompassing 4.6 x 1.9 cm in transaxial dimensions. 13 mm acute/early subacute infarct within the left frontoparietal subcortical white matter. Redemonstrated chronic left MCA territory cortical/subcortical infarct within the left frontal lobe, left insula/subinsular white matter and deep left frontal white matter. Background mild generalized cerebral atrophy and moderate cerebral white matter chronic small vessel ischemic disease. Remote blood products within the left midbrain indicative of prior hemorrhage at this site. Electronically Signed: By: KKellie SimmeringDO On: 02/15/2020 16:34   [place]NAL  Result Date: 02/16/2020 CLINICAL DATA:  Acute renal injury EXAM: RENAL / URINARY TRACT ULTRASOUND COMPLETE COMPARISON:  12/12/2019 FINDINGS: Right Kidney: Renal measurements: 9.7 x 5.0 x 5.5 cm. = volume: 138 mL. 1.3 cm cyst  is noted in the mid to upper pole of the right kidney. It is simple in nature. 6 mm stone is noted stable from prior PET-CT. Mild cortical thinning is noted. Left Kidney: Renal measurements: 11.3 x 6.3 x 5.5 cm = volume: 207 mL. Echogenicity within normal limits. No mass or hydronephrosis visualized. Bladder: Decompressed by Foley catheter. Other: None. IMPRESSION: Right renal cyst. Nonobstructing right renal stone. Cortical thinning in the right kidney. No other focal abnormality is noted. Electronically Signed   By: Inez Catalina M.D.   On: 02/16/2020 20:03   US Carotid Bilateral  Result Date: 02/17/2020 CLINICAL DATA:  69 year old female with history of stroke-like symptoms. EXAM: BILATERAL CAROTID DUPLEX ULTRASOUND TECHNIQUE: Pearline Cables scale imaging, color Doppler and duplex ultrasound were performed of bilateral carotid and vertebral arteries in the neck. COMPARISON:  None. FINDINGS: Criteria: Quantification of carotid stenosis is based on velocity parameters that correlate the residual internal  carotid diameter with NASCET-based stenosis levels, using the diameter of the distal internal carotid lumen as the denominator for stenosis measurement. The following velocity measurements were obtained: RIGHT ICA: Peak systolic velocity 43 cm/sec, End diastolic velocity 9 cm/sec CCA: Peak systolic velocity 72 cm/sec SYSTOLIC ICA/CCA RATIO:  0.6 ECA: Peak systolic velocity 76 cm/sec LEFT ICA: Peak systolic velocity 49 cm/sec, End diastolic velocity 8 cm/sec CCA: 83 cm/sec SYSTOLIC ICA/CCA RATIO:  0.6 ECA: 62 cm/sec RIGHT CAROTID ARTERY: No atherosclerotic plaque formation. No significant tortuosity. Normal low resistance waveforms. RIGHT VERTEBRAL ARTERY:  Antegrade flow. LEFT CAROTID ARTERY: No atherosclerotic plaque formation. No significant tortuosity. Normal low resistance waveforms. LEFT VERTEBRAL ARTERY:  Antegrade flow. Upper extremity non-invasive blood pressures: Not obtained. IMPRESSION: 1. Right carotid artery system: Patent without significant atherosclerotic plaque formation. 2. Left carotid artery system: Patent without significant atherosclerotic plaque formation. 3.  Vertebral artery system: Patent with antegrade flow bilaterally. Ruthann Cancer, MD [facility] Electronically Signed   By: Ruthann Cancer MD   On: 02/17/2020 10:42    PERFORMANCE STATUS (ECOG) : 3 - Symptomatic, >50% confined to bed  Review of Systems Unless otherwise noted, a complete review of systems is negative.  Physical Exam General: NAD Cardiovascular: Irregular Pulmonary: Unlabored Abdomen: soft, nontender, + bowel sounds GU: no suprapubic tenderness Extremities: no edema, no joint deformities Skin: no rashes Neurological: Weakness but otherwise nonfocal  IMPRESSION: Routine follow-up visit.  Weekend notes reviewed.  Patient developed A. fib with RVR requiring digoxin and transferred to PCU.  Patient is currently without symptomatic complaints.  She is  comfortable appearing.  Oral intake appears to be adequate.  There was question if patient would be eligible for hospice IPU.  I do not think that patient would meet eligibility criteria at the present time.  This can be reassessed should patient decline.  Instead, SNF or LTAC would appear to be more clinically appropriate.  Case discussed with SW and hospice liaison.  PLAN: -Continue current scope of treatment -Anticipate SNF/LTC with hospice versus palliative care following   Time Total: 15 minutes  Visit consisted of counseling and education dealing with the complex and emotionally intense issues of symptom management and palliative care in the setting of serious and potentially life-threatening illness.Greater than 50%  of this time was spent counseling and coordinating care related to the above assessment and plan.  Signed by: Altha Harm, PhD, NP-C

## 2020-02-19 NOTE — Progress Notes (Addendum)
[place] ([place] patient RN note:  Patient is a current hospice patient with a terminal diagnosis of bladder cancer with metastases to liver. She presented to the ED 10.13 for weakness and dizziness. Hospice was not notified prior to transport. Patient is a DNR and this is a related admission.  Visit with patient at bedside. She was lying in bed and stated she was feeling well. She has her food there but states she is not interested in eating much. We discussed that PT was not able to work with her due to her AFIB and start on IV Cardizem. Carotid dopplers done. Plan is to taper off Cardizem drip. Plan is to D/C to SNF when medically stable.  VS: BP 110/89, HR 107, Resp 22, Temp. 98.2,  O2 sat 95% on 2 Liters  I&O: 279ml/760ml  Abnormal labs: Calcium: 8.0 (L) WBC: 10.6 (H) Hemoglobin: 10.6 (L) HCT: 34.6 (L) RDW: 15.9 (H)  Diagnostics: Carotid US IMPRESSION: 1. Right carotid artery system: Patent without significant atherosclerotic plaque formation.  2. Left carotid artery system: Patent without significant atherosclerotic plaque formation.  3.  Vertebral artery system: Patent with antegrade flow bilaterally.  IV/PRN Meds: diltiazem (CARDIZEM) 125 mg in dextrose 5% 125 mL (1 mg/mL) infusion Rate: 5-15 mL/hr Dose: 5-15 mg/hr Freq: Titrated Route: IV Last Dose: 15 mg/hr (02/19/20 1255) cefTRIAXone (ROCEPHIN) 1 g in sodium chloride 0.9 % 100 mL IVPB Dose: 1 g Freq: Every 24 hours Route: IV Last Dose: 1 g (02/18/20 2212)  Problem List: Active Problems:   Atrial fibrillation with RVR (HCC)   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HCC)   Essential hypertension   Type 2 diabetes mellitus without complication, without long-term current use of insulin (HCC)   Morbid obesity with BMI of 45.0-49.9, adult (HCC)   Chronic diastolic CHF (congestive heart failure) (HCC)   Endometrial cancer (HCC)   COPD with acute bronchitis  (HCC)   Vertigo   Dehydration   AKI (acute kidney injury) ([place])   Cerebellar stroke ([place])   Metastatic carcinoma ([place])  69 y.o.femalewith medical history significant of Bladder cancer s/p transurethral resection of bladder tumorwith secondary malignant neoplasm of liver, stroke, congestive heart failure, atrial fibrillationnot on anticoagultion, diabetes, hypertension, chronic pain.Acquired thrombophilia endometrial mass, morbid obesity frequent falls, COPD, indwelling catheter, mediastinal mass admitted for AKI, dehydration, dizziness.Went into afib rvr.  Dizziness/Vertigo- Has some vertigo sx , also with stroke found on Mri, and afib rvr, likely multifactorial Continue with meclizine Continue rate control for A. fib  Acute /early subacute infarct-  On MRI found with acute/early subacute infarct within the left frontoparietal subcortical white matter and an acute infarct within the right cerebellar hemisphere, please see full MRI result  -Neurology following -stroke likely due to her A. fib, anticoagulation was started  Clinically she is doing better  Carotids no significant stenosis  PT OT recommend SNF  Previously not on anticoagulation due to hematuria prior to her surgery  Target fasting lipid less than 70  .Urothelial carcinoma of bladder(HCC) widely metastatic - -Oncology following-no plans for systemic treatment and should continue hospice services Palliative was consulted, needs rehab with palliative care and once goes back home needs hospice care Case management may be having a hard time with placing patient to SNF, will have hospice come back to discuss home with hospice if possible while case management looking for long-term placement  .Essential hypertension- Stable.  Beta blk, cardizem p.o. was held due to patient going into  A. fib RVR and being placed on Cardizem drip  We will start p.o. Cardizem 30 mg every 6 hours with trying to taper off of Cardizem  drip   .Endometrial cancer [facility])- Oncology as above  .acute on Chronic diastolic CHF(congestive heart failure) (HCC) - Mildly volume overloaded. Was off diuretics. Likely afib rvr exacerbated this Will give gentle diuresis as prn, lasix 20iv x1  .Atrial fibrillation with RVR (HCC)-chronic 10/17rate not controlled overnight, and IV dig x2 still rate not controlled 10/18-heart rate still variable better controlled than yesterday.   Will start Cardizem p.o. 30 mg every 6 hours and see if we can taper down Cardizem drip  Continue anticoagulation  Cardiology following -avoid amiodarone since patient is not therapeutically anticoagulated and risk of thromboembolic event from pharmacologic conversion still present  .COPDwith hx/o bronchitis ([place]) -currently without exacerbation Avoid needs treatment currently due to A. fib RVR   .Dehydration-was rehydrated Resolved Recommend hydration p.o.  .AKI (acute kidney injury) ([place])-- likely secondary to dehydration Resolved with IV fluid for hydration Creatinine today is 0.91 Continue to periodically monitor  UTi - pt with indwelling catheter, unsure if true infection vs colonization Urine culture with mixed species needs recollection Completed abx with Rocephin.  Discharge Planning: Ongoing  Family Contact:   IDG: Updated  Goals of care: Ongoing  Please call with any hospice related questions or concerns.  Zandra Abts, RN [facility] Liaison [phone]

## 2020-02-19 NOTE — Progress Notes (Signed)
CTA head could be obtained to complete stroke work up, but results of this testing modality would be unlikely to change management as she is on NOAC for atrial fibrillation.   Electronically signed: Dr. Kerney Elbe

## 2020-02-19 NOTE — Progress Notes (Addendum)
PROGRESS NOTE    Brittney Shepard  NFA:[id number] DOB: 1950-07-26 DOA: 02/14/2020 PCP: Marguerita Merles, MD    Brief Narrative:  Brittney Shepard is a 69 y.o. female with medical history significant of Bladder cancer s/p transurethral resection of bladder tumorwith secondary malignant neoplasm of liver, stroke, congestive heart failure, atrial fibrillation not on anticoagultion, diabetes, hypertension, chronic pain.Acquired thrombophilia endometrial mass, morbid obesity frequent falls, COPD, indwelling catheter, mediastinal mass Presented with   feeling dizziness with movement for past 1 day and had an episode of vomiting. Describes vertigo-like sensation which is worse when she is trying to move.  10/15- had afib rvr on tele.  10/16- HR improved some.  10/17: overnight with afib RVR, given IV digoxin x2 and fluid bolus by night team 10/18: tachycardia better, HR 90-105 at rest only. Has not ambulated yet.  Consultants:   Oncology, neurology, cardiology  Procedures:  CT head Remote infarcts. Senescent changes. No evidence of acute intracranial hemorrhage or infarct.    Antimicrobials:       Subjective: Has no new complaints. On [place] . Reports "peeing " to much this am and wants a diaper to wear.   Objective: Vitals:   02/19/20 0400 02/19/20 0402 02/19/20 0407 02/19/20 0813  BP:  126/71  118/62  Pulse: 71 92  68  Resp:  19  17  Temp:  97.6 F (36.4 C)  97.9 F (36.6 C)  TempSrc:  Oral  Oral  SpO2:  96%  97%  Weight:   120.7 kg   Height:        Intake/Output Summary (Last 24 hours) at 02/19/2020 0831 Last data filed at 02/19/2020 0407 Gross per 24 hour  Intake 276.88 ml  Output 760 ml  Net -483.12 ml   Filed Weights   02/15/20 1700 02/19/20 0407  Weight: 118.3 kg 120.7 kg    Examination: Calm, lying in bed, non tachypnic +fine rales scattered b/l no wheezing Irregul, s1/s2 no m/r/g Benign +bs, nt +mild b/l edema aaxox3  Mood and affect stable for the  current setting     Data Reviewed: I have personally reviewed following labs and imaging studies  CBC: Recent Labs  Lab 02/14/20 1949 02/15/20 0501 02/18/20 0438 02/19/20 0248  WBC 16.0* 10.7* 10.8* 10.6*  NEUTROABS  --  8.9*  --   --   HGB 12.3 11.2* 11.3* 10.6*  HCT 39.6 35.2* 36.3 34.6*  MCV 86.5 87.6 86.4 87.8  PLT 430* 326 341 469   Basic Metabolic Panel: Recent Labs  Lab 02/14/20 1949 02/15/20 0501 02/16/20 0458 02/19/20 0248  NA 135 137 141 137  K 4.7 4.6 4.1 4.1  CL 102 106 108 106  CO2 20* 22 26 23   GLUCOSE 209* 152* 101* 92  BUN 21 20 16 11   CREATININE 1.54* 1.31* 1.10* 0.91  CALCIUM 8.0* 7.6* 8.0* 8.0*  MG  --  2.0  --   --   PHOS  --  3.8  --   --    GFR: Estimated Creatinine Clearance: 67.7 mL/min (by C-G formula based on SCr of 0.91 mg/dL). Liver Function Tests: Recent Labs  Lab 02/15/20 0501  AST 13*  ALT 7  ALKPHOS 64  BILITOT 1.0  PROT 6.5  ALBUMIN 2.8*   No results for input(s): LIPASE, AMYLASE in the last 168 hours. No results for input(s): AMMONIA in the last 168 hours. Coagulation Profile: No results for input(s): INR, PROTIME in the last 168 hours. Cardiac Enzymes: No results  for input(s): CKTOTAL, CKMB, CKMBINDEX, TROPONINI in the last 168 hours. BNP (last 3 results) No results for input(s): PROBNP in the last 8760 hours. HbA1C: No results for input(s): HGBA1C in the last 72 hours. CBG: Recent Labs  Lab 02/18/20 1702 02/18/20 2032 02/18/20 2323 02/19/20 0406 02/19/20 0812  GLUCAP 119* 149* 163* 113* 122*   Lipid Profile: No results for input(s): CHOL, HDL, LDLCALC, TRIG, CHOLHDL, LDLDIRECT in the last 72 hours. Thyroid Function Tests: No results for input(s): TSH, T4TOTAL, FREET4, T3FREE, THYROIDAB in the last 72 hours. Anemia Panel: No results for input(s): VITAMINB12, FOLATE, FERRITIN, TIBC, IRON, RETICCTPCT in the last 72 hours. Sepsis Labs: No results for input(s): PROCALCITON, LATICACIDVEN in the last 168  hours.  Recent Results (from the past 240 hour(s))  Urine culture     Status: Abnormal   Collection Time: 02/14/20  7:49 PM   Specimen: Urine, Random  Result Value Ref Range Status   Specimen Description   Final    URINE, RANDOM Performed at [facility], [address] [place], [place] [address]    Special Requests   Final    NONE Performed at [facility], [address] [place], [place] [address]    Culture MULTIPLE SPECIES PRESENT, SUGGEST RECOLLECTION (A)  Final   Report Status 02/16/2020 FINAL  Final  Respiratory Panel by RT PCR (Flu A&B, Covid) - Nasopharyngeal Swab     Status: None   Collection Time: 02/14/20  9:45 PM   Specimen: Nasopharyngeal Swab  Result Value Ref Range Status   SARS Coronavirus 2 by RT PCR NEGATIVE NEGATIVE Final    Comment: (NOTE) SARS-CoV-2 target nucleic acids are NOT DETECTED.  The SARS-CoV-2 RNA is generally detectable in upper respiratoy specimens during the acute phase of infection. The lowest concentration of SARS-CoV-2 viral copies this assay can detect is 131 copies/mL. A negative result does not preclude SARS-Cov-2 infection and should not be used as the sole basis for treatment or other patient management decisions. A negative result may occur with  improper specimen collection/handling, submission of specimen other than nasopharyngeal swab, presence of viral mutation(s) within the areas targeted by this assay, and inadequate number of viral copies (<131 copies/mL). A negative result must be combined with clinical observations, patient history, and epidemiological information. The expected result is Negative.  Fact Sheet for Patients:  PinkCheek.be  Fact Sheet for Healthcare Providers:  GravelBags.it  This test is no t yet approved or cleared by the [place] FDA and  has been authorized for detection and/or diagnosis of SARS-CoV-2 by FDA under an  Emergency Use Authorization (EUA). This EUA will remain  in effect (meaning this test can be used) for the duration of the COVID-19 declaration under Section 564(b)(1) of the Act, 21 U.S.C. section 360bbb-3(b)(1), unless the authorization is terminated or revoked sooner.     Influenza A by PCR NEGATIVE NEGATIVE Final   Influenza B by PCR NEGATIVE NEGATIVE Final    Comment: (NOTE) The Xpert Xpress SARS-CoV-2/FLU/RSV assay is intended as an aid in  the diagnosis of influenza from Nasopharyngeal swab specimens and  should not be used as a sole basis for treatment. Nasal washings and  aspirates are unacceptable for Xpert Xpress SARS-CoV-2/FLU/RSV  testing.  Fact Sheet for Patients: PinkCheek.be  Fact Sheet for Healthcare Providers: GravelBags.it  This test is not yet approved or cleared by the [place] FDA and  has been authorized for detection and/or diagnosis of SARS-CoV-2 by  FDA under an  Emergency Use Authorization (EUA). This EUA will remain  in effect (meaning this test can be used) for the duration of the  Covid-19 declaration under Section 564(b)(1) of the Act, 21  U.S.C. section 360bbb-3(b)(1), unless the authorization is  terminated or revoked. Performed at [facility], [address] [place], [place] [address]          Radiology Studies: US Carotid Bilateral  Result Date: 02/17/2020 CLINICAL DATA:  69 year old female with history of stroke-like symptoms. EXAM: BILATERAL CAROTID DUPLEX ULTRASOUND TECHNIQUE: Pearline Cables scale imaging, color Doppler and duplex ultrasound were performed of bilateral carotid and vertebral arteries in the neck. COMPARISON:  None. FINDINGS: Criteria: Quantification of carotid stenosis is based on velocity parameters that correlate the residual internal carotid diameter with NASCET-based stenosis levels, using the diameter of the distal internal carotid lumen as the  denominator for stenosis measurement. The following velocity measurements were obtained: RIGHT ICA: Peak systolic velocity 43 cm/sec, End diastolic velocity 9 cm/sec CCA: Peak systolic velocity 72 cm/sec SYSTOLIC ICA/CCA RATIO:  0.6 ECA: Peak systolic velocity 76 cm/sec LEFT ICA: Peak systolic velocity 49 cm/sec, End diastolic velocity 8 cm/sec CCA: 83 cm/sec SYSTOLIC ICA/CCA RATIO:  0.6 ECA: 62 cm/sec RIGHT CAROTID ARTERY: No atherosclerotic plaque formation. No significant tortuosity. Normal low resistance waveforms. RIGHT VERTEBRAL ARTERY:  Antegrade flow. LEFT CAROTID ARTERY: No atherosclerotic plaque formation. No significant tortuosity. Normal low resistance waveforms. LEFT VERTEBRAL ARTERY:  Antegrade flow. Upper extremity non-invasive blood pressures: Not obtained. IMPRESSION: 1. Right carotid artery system: Patent without significant atherosclerotic plaque formation. 2. Left carotid artery system: Patent without significant atherosclerotic plaque formation. 3.  Vertebral artery system: Patent with antegrade flow bilaterally. Ruthann Cancer, MD [facility] Electronically Signed   By: Ruthann Cancer MD   On: 02/17/2020 10:42        Scheduled Meds: . apixaban  5 mg Oral BID  . Chlorhexidine Gluconate Cloth  6 each Topical Daily  . darifenacin  7.5 mg Oral Daily  . furosemide  20 mg Intravenous Once  . influenza vaccine adjuvanted  0.5 mL Intramuscular Tomorrow-1000  . insulin aspart  0-9 Units Subcutaneous Q4H  . meclizine  25 mg Oral BID   Continuous Infusions: . sodium chloride 10 mL/hr at 02/18/20 0516  . cefTRIAXone (ROCEPHIN)  IV 1 g (02/18/20 2212)  . diltiazem (CARDIZEM) infusion 12.5 mg/hr (02/19/20 0011)    Assessment & Plan:   Active Problems:   Atrial fibrillation with RVR (HCC)   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HCC)   Essential hypertension   Type 2 diabetes mellitus without complication, without long-term current  use of insulin (HCC)   Morbid obesity with BMI of 45.0-49.9, adult (HCC)   Chronic diastolic CHF (congestive heart failure) (HCC)   Endometrial cancer (HCC)   COPD with acute bronchitis (HCC)   Vertigo   Dehydration   AKI (acute kidney injury) ([place])   Cerebellar stroke ([place])   Metastatic carcinoma ([place])   69 y.o. female with medical history significant of Bladder cancer s/p transurethral resection of bladder tumorwith secondary malignant neoplasm of liver, stroke, congestive heart failure, atrial fibrillation not on anticoagultion, diabetes, hypertension, chronic pain.Acquired thrombophilia endometrial mass, morbid obesity frequent falls, COPD, indwelling catheter, mediastinal mass admitted for AKI, dehydration, dizziness.Went into afib rvr.  Dizziness/Vertigo- Has some vertigo sx , also with stroke found on Mri, and afib rvr, likely multifactorial Continue with meclizine Continue rate control for A. fib    Acute /early subacute infarct-  On MRI found with acute/early subacute infarct within the left frontoparietal subcortical white matter and an acute infarct within the right cerebellar hemisphere, please see full MRI result  -Neurology following -stroke likely due to her A. fib, anticoagulation was started  Clinically she is doing better  Carotids no significant stenosis  PT OT recommend SNF  Previously not on anticoagulation due to hematuria prior to her surgery  Target fasting lipid less than 70    . 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[place]) widely metastatic - -Oncology following-no plans for systemic treatment and should continue hospice services Palliative was consulted, needs rehab with palliative care and once goes back home needs hospice care Case management may be having a hard time with placing patient to SNF, will have hospice come back to discuss home with hospice if possible while case management looking for long-term placement   . Essential hypertension  - Stable.  Beta blk, cardizem p.o. was held due to patient going into A. fib RVR and being placed on Cardizem drip  We will start p.o. Cardizem 30 mg every 6 hours with trying to taper off of Cardizem drip     . Endometrial cancer [facility]) - Oncology as above   . acute on Chronic diastolic CHF (congestive heart failure) (HCC) - Mildly volume overloaded. Was off diuretics. Likely afib rvr exacerbated this Will give gentle diuresis as prn, lasix 20iv x1    . Atrial fibrillation with RVR (HCC) -chronic 10/17rate not controlled overnight, and IV dig x2 still rate not controlled 10/18-heart rate still variable better controlled than yesterday.   Will start Cardizem p.o. 30 mg every 6 hours and see if we can taper down Cardizem drip  Continue anticoagulation  Cardiology following -avoid amiodarone since patient is not therapeutically anticoagulated and risk of thromboembolic event from pharmacologic conversion still present  . COPD with hx/o bronchitis ([place]) -currently without exacerbation Avoid needs treatment currently due to A. fib RVR     . Dehydration -was rehydrated Resolved Recommend hydration p.o.  . AKI (acute kidney injury) ([place]) - - likely secondary to dehydration Resolved with IV fluid for hydration Creatinine today is 0.91 Continue to periodically monitor   UTi - pt with indwelling catheter, unsure if true infection vs colonization Urine culture with mixed species needs recollection Completed abx with Rocephin.   DVT prophylaxis: Eliquis Code Status:dnr Family Communication: none at bedside  Status is: inpatient  Patient remains as inpatient due to severity of illness requiring hospitalizatoin.   Dispo: The patient is from: Home              Anticipated d/c is to: SNF with palliative care.              Anticipated d/c date is: 2              Patient currently is not medically stable as need heart better controlled.  Also SNF pending          LOS:  3 days   Time spent: 45 min with >50% on coc    Nolberto Hanlon, MD [facility] Pager [phone]  If 7PM-7AM, please contact night-coverage [email or web address] Password [email or web address] 02/19/2020, 8:31 AM

## 2020-02-19 NOTE — Care Management Important Message (Signed)
Important Message  Patient Details  Name: Brittney Shepard MRN: [id number] Date of Birth: 08-Nov-1950   Medicare Important Message Given:  Yes     [name] 02/19/2020, 1:12 PM

## 2020-02-19 NOTE — Progress Notes (Signed)
OT Cancellation Note  Patient Details Name: Brittney Shepard MRN: [id number] DOB: 08/14/1950   Cancelled Treatment:    Reason Eval/Treat Not Completed: Medical issues which prohibited therapy. Patient admitted to higher level of care. New OT order needed when medically appropriate.     Josiah Lobo, PhD, [place], OTR/L ascom [phone] 02/19/20, 8:18 AM

## 2020-02-19 NOTE — Consult Note (Addendum)
Cardiology Consultation:   Patient ID: Brittney Shepard MRN: [id number]; DOB: [birth year]  Admit date: 02/14/2020 Date of Consult: 02/19/2020  Primary Care Provider: Marguerita Merles, MD Primary Cardiologist: Brittney Sable, MD  Primary Electrophysiologist:  None    Patient Profile:   Brittney Shepard is a 69 y.o. female with a hx of permanent atrial fibrillation, hypertension, DM2, bladder tumor s/p TURBT 11/2019 with persistent foley catheter, and who is being seen today for the evaluation of atrial fibrillation with RVR at the request of Dr. Kurtis Shepard.  History of Present Illness:   Brittney Shepard is a 69 year old female with PMH as above and including persistent atrial fibrillation with RVR.  She was admitted to the hospital 11/18/2019 after a fall.  She was noted to be in atrial fibrillation with rapid ventricular response and rate control achieved with beta-blockers and calcium channel blockers.  Anticoagulation was discussed; however, she wanted to continue on ASA despite CHA2DS2-VASc score of at least 5.  She was diagnosed with bladder tumor and undergoing a procedure, preventing anticoagulation initiation.   Echo 11/19/2019 showed LVEF greater than 55%, severe LVH, trivial MR.  She was seen in office 12/08/2019 by Brittney Shepard.  Atenolol was decreased from 50 mg daily to 25 mg daily.  She was continued on ASA 81 mg per her preference.  On 12/22/2019, she presented to [facility] emergency department with report that her Foley output was discolored and a fall with bandage on her right lower extremity.  She reported decreased energy and was noted to be in known permanent atrial fibrillation with RVR.  She was hypotensive with BP 89/53 and ventricular rates into the 150s. Labs showed lactic acid 2.2 with sepsis protocol activated, hypokalemia with potassium 3.3, hypomagnesemia with Mg 1.0, AKI with creatinine 1.21, hypoalbuminemia with albumin 3.1, anemia with hemoglobin 11.8.  Eliquis was initiated  August 23 and during admission.  Prior to discharge, it was noted that she had some blood-tinged urine since starting on Eliquis and dose decreased from 5 mg twice daily to 2.5 mg twice daily.  She reports that, after her most recent admission, she stopped taking her Eliquis 2.5 mg twice daily.  She reports that her physician told her to stop taking it; however, she is not able to specify which physician instructed her to stop anticoagulation. She states that since her last admission she has been living home and on review of EMR receives home hospice.    Last Wednesday, 02/14/2020, she reports that she got into bed approximately 6:30 PM and felt extremely dizzy x1 hour, as if the world was spinning around her.  She attempted to get out of bed but was unable to do so.  She then called EMS and was brought to [facility] emergency department.  During consultation today, she denies any episodes of emesis; however, per review of EMR, she did have one episode of emesis prior to arrival in the emergency department.  She denies any syncope or falls.  No racing heart rate or palpitations associated with her dizziness.  She denies any orthopnea, PND, abdominal distention, lower extremity edema, or early satiety.  She also denies any signs or symptoms of bleeding leading up to this dizziness.  No focal weakness or neurologic symptoms reported.  In the emergency department at [facility] 10/13, initial vitals significant for ventricular rate 138, 91% on room air, BP 94/79.  Labs significant for potassium 4.7, creatinine 1.54, BUN 21, BNP 6, hemoglobin 12.3, RBC 4.58, hematocrit 39.6.  UA positive  for leukocytes, bacteria, and and Hgb.  CT head showed remote infarcts, senescent changes, and no evidence of acute intracranial hemorrhage or infarction.  MR brain showed patchy acute infarcts within the right cerebellar hemisphere, encompassing 4.6 x 1.9 cm in transaxial dimensions, 13 mm acute/early subacute infarct within the left  frontoparietal subcortical white matter, and redemonstrated chronic left MCA territory cortical/subcortical infarct within the left frontal/insula/subinsular white matter [place].  Also noted was chronic small vessel ischemic disease and remote blood products within the left midbrain, indicative of prior hemorrhage.  Ultrasound of the kidneys showed cortical thinning in the right kidney.  Ultrasound of the carotids showed no significant atherosclerotic plaque formation.  Since admission, she has been started on meclizine. SNF has been recommended. AKI noted and diuretic held with UTI undergoing treatment with abx.  On 02/15/2020, she was seen by oncology and deemed okay to start anticoagulation from an oncology standpoint.  On 02/16/2020, she was seen Na neurology with etiology of stroke thought likely embolic and related to atrial fibrillation.  Per neurology, okay to start anticoagulation. Palliative care consulted as well and following this admission with plan for SNF. During this admission, it has been noted that ventricular rates are difficult to control with known history of bradycardia and hypotension with escalation of rate control in the past.  She has thus been receiving IV digoxin in addition to her fluids.  Heart Pathway Score:     Past Medical History:  Diagnosis Date  . A-fib ([place])   . Bladder cancer ([place])   . CHF (congestive heart failure) ([place])   . Chronic pain   . Cognitive communication deficit   . Diabetes mellitus without complication ([place])   . Hypertension   . Hypokalemia   . Muscle weakness (generalized)   . 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 Secondary malignant neoplasm of liver ([place])   . Stroke [facility]) 2013  . Urinary tract infection, site not specified     Past Surgical History:  Procedure Laterality Date  . CYSTOSCOPY W/ RETROGRADES N/A 11/20/2019   Procedure: CYSTOSCOPY WITH RETROGRADE PYELOGRAM;  Surgeon: Billey Co, MD;  Location: [facility] ORS;  Service:  Urology;  Laterality: N/A;  . CYSTOSCOPY WITH STENT PLACEMENT Left 11/20/2019   Procedure: CYSTOSCOPY WITH STENT PLACEMENT;  Surgeon: Billey Co, MD;  Location: [facility] ORS;  Service: Urology;  Laterality: Left;  . TRANSURETHRAL RESECTION OF BLADDER TUMOR N/A 11/20/2019   Procedure: TRANSURETHRAL RESECTION OF BLADDER TUMOR (TURBT);  Surgeon: Billey Co, MD;  Location: [facility] ORS;  Service: Urology;  Laterality: N/A;     Home Medications:  Prior to Admission medications   Medication Sig Start Date End Date Taking? Authorizing Provider  acetaminophen (TYLENOL) 325 MG tablet Take 2 tablets (650 mg total) by mouth every 6 (six) hours as needed for mild pain (or Fever >/= 101). 03/27/16  Yes Mody, Ulice Bold, MD  atenolol (TENORMIN) 100 MG tablet Take 50 mg by mouth daily.   Yes [provider]  diltiazem (CARDIZEM SR) 60 MG 12 hr capsule Take 1 capsule (60 mg total) by mouth every 12 (twelve) hours. Patient taking differently: Take 60 mg by mouth 2 (two) times daily.  03/27/16  Yes Mody, Ulice Bold, MD  furosemide (LASIX) 40 MG tablet Take 1 tablet (40 mg total) by mouth 2 (two) times daily. 03/27/16  Yes Mody, Ulice Bold, MD  metFORMIN (GLUCOPHAGE) 500 MG tablet Take 500 mg by mouth daily with breakfast.    Yes [provider]  Multiple Vitamin (MULTIVITAMIN) tablet Take 1 tablet by mouth daily.   Yes [provider]  potassium chloride (KLOR-CON) 10 MEQ tablet Take 10 mEq by mouth 2 (two) times daily.   Yes [provider]  aspirin EC 81 MG tablet Take 81 mg by mouth daily. Swallow whole.    [provider]    Inpatient Medications: Scheduled Meds: . apixaban  5 mg Oral BID  . Chlorhexidine Gluconate Cloth  6 each Topical Daily  . darifenacin  7.5 mg Oral Daily  . influenza vaccine adjuvanted  0.5 mL Intramuscular Tomorrow-1000  . insulin aspart  0-9 Units Subcutaneous Q4H  . meclizine  25 mg Oral BID   Continuous Infusions: . sodium chloride 10 mL/hr  at 02/18/20 0516  . cefTRIAXone (ROCEPHIN)  IV 1 g (02/18/20 2212)  . diltiazem (CARDIZEM) infusion 12.5 mg/hr (02/19/20 0011)   PRN Meds: sodium chloride, acetaminophen **OR** acetaminophen, metoCLOPramide (REGLAN) injection  Allergies:    Allergies  Allergen Reactions  . Penicillin G Hives  . Sulfa Antibiotics Rash    Social History:   Social History   Socioeconomic History  . Marital status: Widowed    Spouse name: Not on file  . Number of children: Not on file  . Years of education: Not on file  . Highest education level: Not on file  Occupational History  . Not on file  Tobacco Use  . Smoking status: Never Smoker  . Smokeless tobacco: Never Used  Substance and Sexual Activity  . Alcohol use: No  . Drug use: No  . Sexual activity: Not Currently  Other Topics Concern  . Not on file  Social History Narrative   Retd. 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 Has one son.    Social Determinants of Health   Financial Resource Strain:   . Difficulty of Paying Living Expenses: Not on file  Food Insecurity:   . Worried About Charity fundraiser in the Last Year: Not on file  . Ran Out of Food in the Last Year: Not on file  Transportation Needs:   . Lack of Transportation (Medical): Not on file  . Lack of Transportation (Non-Medical): Not on file  Physical Activity:   . Days of Exercise per Week: Not on file  . Minutes of Exercise per Session: Not on file  Stress:   . Feeling of Stress : Not on file  Social Connections:   . Frequency of Communication with Friends and Family: Not on file  . Frequency of Social Gatherings with Friends and Family: Not on file  . Attends Religious Services: Not on file  . Active Member of Clubs or Organizations: Not on file  . Attends Archivist Meetings: Not on file  . Marital Status: Not on file  Intimate Partner Violence:   . Fear of Current or Ex-Partner: Not on file  . Emotionally Abused: Not on  file  . Physically Abused: Not on file  . Sexually Abused: Not on file    Family History:    Family History  Problem Relation Age of Onset  . Ovarian cancer Mother   . Cancer Sister      ROS:  Please see the history of present illness.  Review of Systems  Constitutional: Positive for malaise/fatigue.  Respiratory: Negative for cough, hemoptysis, sputum production, shortness of breath and wheezing.   Cardiovascular: Negative for chest pain, palpitations, orthopnea, claudication, leg swelling and PND.  Gastrointestinal: Negative  for abdominal pain, blood in stool, heartburn, melena, nausea and vomiting.       Denies emesis though noted on EMR as occurring x1 before admission 10/13  Genitourinary: Negative for dysuria, hematuria and urgency.  Musculoskeletal: Negative for falls.  Neurological: Positive for dizziness. Negative for focal weakness and loss of consciousness.  All other systems reviewed and are negative.   All other ROS reviewed and negative.     Physical Exam/Data:   Vitals:   02/19/20 0300 02/19/20 0400 02/19/20 0402 02/19/20 0407  BP: 112/60  126/71   Pulse:  71 92   Resp:   19   Temp:   97.6 F (36.4 C)   TempSrc:   Oral   SpO2:   96%   Weight:    120.7 kg  Height:        Intake/Output Summary (Last 24 hours) at 02/19/2020 0742 Last data filed at 02/19/2020 0407 Gross per 24 hour  Intake 276.88 ml  Output 760 ml  Net -483.12 ml   Last 3 Weights 02/19/2020 02/15/2020 01/15/2020  Weight (lbs) 266 lb 3.2 oz 260 lb 12.9 oz 250 lb  Weight (kg) 120.748 kg 118.3 kg 113.399 kg     Body mass index is 51.99 kg/m.  General:  Obese female, in no acute distress, somnolent having just woken for the morning HEENT: normal Neck: JVD difficult to assess due to body habitus Vascular: No carotid bruits; radial pulses 2+ bilaterally Cardiac:  normal S1, S2; IRIR; no murmur  Lungs: Distant breath sounds, clear to auscultation bilaterally, no wheezing, rhonchi or  rales  Abd: Nice, soft, nontender, no hepatomegaly  Ext: no edema, bilateral SCDs in place Musculoskeletal:  No deformities, BUE and BLE strength normal and equal Skin: warm and dry  Neuro:  No focal abnormalities noted Psych:  Normal affect   EKG:  The EKG was personally reviewed and demonstrates:Atrial fibrillation, ventricular rate 129 bpm, T wave inversion noted in inferior leads II, 3, aVF, baseline wander, prolonged QTC 503 ms, poor R wave progression leads V1 to V4 Telemetry:  Telemetry was personally reviewed and demonstrates: Atrial fibrillation with RVR and venticular rates low 100s into the 150s, PVCs  Relevant CV Studies: Echo 11/19/19 1. Left ventricular ejection fraction, by estimation, is >55%. The left  ventricle has normal function. Left ventricular endocardial border not  optimally defined to evaluate regional wall motion. There is severe left  ventricular hypertrophy. Left  ventricular diastolic function could not be evaluated.  2. Right ventricular systolic function is normal. The right ventricular  size is not well visualized. Tricuspid regurgitation signal is inadequate  for assessing PA pressure.  3. The mitral valve is grossly normal. Trivial mitral valve  regurgitation.  4. The aortic valve is grossly normal. Aortic valve regurgitation not  well assessed.   Laboratory Data:  High Sensitivity Troponin:   Recent Labs  Lab 02/14/20 1949  TROPONINIHS 6     Cardiac EnzymesNo results for input(s): TROPONINI in the last 168 hours. No results for input(s): TROPIPOC in the last 168 hours.  Chemistry Recent Labs  Lab 02/15/20 0501 02/16/20 0458 02/19/20 0248  NA 137 141 137  K 4.6 4.1 4.1  CL 106 108 106  CO2 22 26 23   GLUCOSE 152* 101* 92  BUN 20 16 11   CREATININE 1.31* 1.10* 0.91  CALCIUM 7.6* 8.0* 8.0*  GFRNONAA 41* 50* >60  ANIONGAP 9 7 8     Recent Labs  Lab 02/15/20 0501  PROT 6.5  ALBUMIN 2.8*  AST 13*  ALT 7  ALKPHOS 64  BILITOT  1.0   Hematology Recent Labs  Lab 02/14/20 1949 02/15/20 0501 02/18/20 0438  WBC 16.0* 10.7* 10.8*  RBC 4.58 4.02 4.20  HGB 12.3 11.2* 11.3*  HCT 39.6 35.2* 36.3  MCV 86.5 87.6 86.4  MCH 26.9 27.9 26.9  MCHC 31.1 31.8 31.1  RDW 15.9* 15.7* 16.0*  PLT 430* 326 341   BNP Recent Labs  Lab 02/18/20 0438  BNP 380.9*    DDimer No results for input(s): DDIMER in the last 168 hours.   Radiology/Studies:  MR BRAIN W WO CONTRAST  Addendum Date: 02/15/2020   ADDENDUM REPORT: 02/15/2020 17:00 ADDENDUM: These results were called by telephone at the time of interpretation on 02/15/2020 at 5:00 pm to provider Dr. Nolberto Hanlon, who verbally acknowledged these results. Electronically Signed   By: Kellie Simmering DO   On: 02/15/2020 17:00   Result Date: 02/15/2020 CLINICAL DATA:  Metastatic disease evaluation. EXAM: MRI HEAD WITHOUT AND WITH CONTRAST TECHNIQUE: Multiplanar, multiecho pulse sequences of the brain and surrounding structures were obtained without and with intravenous contrast. CONTRAST:  7.67mL GADAVIST GADOBUTROL 1 MMOL/ML IV SOLN COMPARISON:  Noncontrast head CT 02/14/2020. FINDINGS: Brain: Mild intermittent motion degradation. This includes mild motion degradation of the axial and coronal postcontrast T1 weighted sequences. Mild generalized cerebral atrophy. No abnormal enhancement is demonstrated to suggest intracranial metastatic disease. There is patchy restricted diffusion within the inferomedial right cerebellar hemisphere and right cerebellar tonsil (PICA vascular territory) consistent with acute infarction. This encompasses a region measuring 4.6 x 1.9 cm in transaxial dimensions (series 5, image 11). Corresponding T2/FLAIR hyperintensity at this site. Additional 13 mm focus of diffusion weighted hyperintensity and corresponding intermediate signal on the ADC map consistent with acute/early subacute infarction (series 5, image 34) (series 7, image 15). Redemonstrated chronic left  MCA territory cortical/subcortical infarct within the frontal lobe, left insula/subinsular white matter and deep left frontal white matter. Mild chronic blood products are present at this site. Chronic blood products are also present within the left midbrain likely reflecting sequela of prior hemorrhage. Moderate multifocal T2/FLAIR hyperintensity within the cerebral white matter is nonspecific, but consistent with chronic small vessel ischemic disease. No extra-axial fluid collection. No midline shift. Incidentally noted pineal gland cyst without suspicious masslike or nodular enhancement. Vascular: Expected proximal arterial flow voids. Skull and upper cervical spine: No focal suspicious marrow lesion. Sinuses/Orbits: Visualized orbits show no acute finding. Mild ethmoid sinus mucosal thickening. Small right maxillary sinus mucous retention cyst. Trace fluid within bilateral mastoid air cells. IMPRESSION: Mildly motion degraded examination. No evidence of intracranial metastatic disease. Patchy acute infarcts within the right cerebellar hemisphere (PICA vascular territory) encompassing 4.6 x 1.9 cm in transaxial dimensions. 13 mm acute/early subacute infarct within the left frontoparietal subcortical white matter. Redemonstrated chronic left MCA territory cortical/subcortical infarct within the left frontal lobe, left insula/subinsular white matter and deep left frontal white matter. Background mild generalized cerebral atrophy and moderate cerebral white matter chronic small vessel ischemic disease. Remote blood products within the left midbrain indicative of prior hemorrhage at this site. Electronically Signed: By: Kellie Simmering DO On: 02/15/2020 16:34   US RENAL  Result Date: 02/16/2020 CLINICAL DATA:  Acute renal injury EXAM: RENAL / URINARY TRACT ULTRASOUND COMPLETE COMPARISON:  12/12/2019 FINDINGS: Right Kidney: Renal measurements: 9.7 x 5.0 x 5.5 cm. = volume: 138 mL. 1.3 cm cyst is noted in the mid to  upper pole of the right  kidney. It is simple in nature. 6 mm stone is noted stable from prior PET-CT. Mild cortical thinning is noted. Left Kidney: Renal measurements: 11.3 x 6.3 x 5.5 cm = volume: 207 mL. Echogenicity within normal limits. No mass or hydronephrosis visualized. Bladder: Decompressed by Foley catheter. Other: None. IMPRESSION: Right renal cyst. Nonobstructing right renal stone. Cortical thinning in the right kidney. No other focal abnormality is noted. Electronically Signed   By: Inez Catalina M.D.   On: 02/16/2020 20:03   US Carotid Bilateral  Result Date: 02/17/2020 CLINICAL DATA:  69 year old female with history of stroke-like symptoms. EXAM: BILATERAL CAROTID DUPLEX ULTRASOUND TECHNIQUE: Pearline Cables scale imaging, color Doppler and duplex ultrasound were performed of bilateral carotid and vertebral arteries in the neck. COMPARISON:  None. FINDINGS: Criteria: Quantification of carotid stenosis is based on velocity parameters that correlate the residual internal carotid diameter with NASCET-based stenosis levels, using the diameter of the distal internal carotid lumen as the denominator for stenosis measurement. The following velocity measurements were obtained: RIGHT ICA: Peak systolic velocity 43 cm/sec, End diastolic velocity 9 cm/sec CCA: Peak systolic velocity 72 cm/sec SYSTOLIC ICA/CCA RATIO:  0.6 ECA: Peak systolic velocity 76 cm/sec LEFT ICA: Peak systolic velocity 49 cm/sec, End diastolic velocity 8 cm/sec CCA: 83 cm/sec SYSTOLIC ICA/CCA RATIO:  0.6 ECA: 62 cm/sec RIGHT CAROTID ARTERY: No atherosclerotic plaque formation. No significant tortuosity. Normal low resistance waveforms. RIGHT VERTEBRAL ARTERY:  Antegrade flow. LEFT CAROTID ARTERY: No atherosclerotic plaque formation. No significant tortuosity. Normal low resistance waveforms. LEFT VERTEBRAL ARTERY:  Antegrade flow. Upper extremity non-invasive blood pressures: Not obtained. IMPRESSION: 1. Right carotid artery system: Patent without  significant atherosclerotic plaque formation. 2. Left carotid artery system: Patent without significant atherosclerotic plaque formation. 3.  Vertebral artery system: Patent with antegrade flow bilaterally. Ruthann Cancer, MD [facility] Radiology Electronically Signed   By: Ruthann Cancer MD   On: 02/17/2020 10:42    Assessment and Plan:   Permanent atrial fibrillation with RVR --Not a new diagnosis.  Asymptomatic in Afib. Denies taking her [place] Eliquis 2.5mg  BID since her last discharge.  Of note, per EMR, she was on reduced dose OAC due to blood-tinged urine prior to discharge.  She states that she has not been taking any anticoagulation, however.  Presented to [facility] ED 10/13 with dizziness and subsequent imaging revealed acute stroke and old blood products as above in HPI.  She has since been evaluated by neurology and oncology with recommendation for restart of anticoagulation. Per neurology, etiology of acute stroke likely embolic and 2/2 Afib. Caution with OAC and closely monitor CBC given her history as above. Ventricular rates have been elevated this admission and historically difficult to control due to low pressure and bradycardia. As in past, rates exacerbated by underlying UTI.   --Continue current IV diltiazem as BP allows. Goal ventricular rate <110bpm. Recommend caution with escalation of rate control as it may exacerbate lower pressures and thus prerenal AKI.  Avoid using amiodarone unless significantly elevated rate, given she is not therapeutically anticoagulated and thus risk of thromboembolic event from pharmacologic conversion is still present.   --Continue Eliquis 5 mg twice daily with close monitoring of CBC, restarted per oncology and neurology this admission. CHA2DS2VASc score of at least 8 (HFpEF, HTN, age, DM2, stroke x2, vascular, female) with recommendation for indefinite anticoagulation as long as benefits outweigh those of risks.    HFpEF --No SOB reported or s/sx of worsening heart failure.  --Previous echo as  above with nl EF. --Appears euvolemic on exam.  --Monitor I/Os. Net -483.1 yesterday. --Wt 118.3kg  120.7kg. Previous admission wt ~126lbs-130lbs in August.  --Trend BNP with most recent BNP 380.9 on 10/17. --Restarted on lasix 20mg  this AM, previously held due to suspected dehydration. --Monitor volume status closely, as well as renal function and electrolytes.  --Daily BMET. Current renal function stable with Cr 0.91 and BUN 11. --Continue O2 as needed.   Hypotension --BP currently soft but improved s/p earlier IVF.   --Caution with BB/CCB/diuretics given h/o prerenal AKI in past.   Acute stroke, suspected emboloic --As above.  Imaging performed and significant for acute stroke.  Per patient, she was not taking her anticoagulation with Eliquis 2.5mg  BID (reduced 2/2 hematuria before prior discharge) since her last admission.   --Ealuated by neurology and oncology with subsequent restart of full dose anticoagulation, as etiology of acute stroke thought likely embolic and 2/2 Afib.   --Closely monitor CBC.  Given history of prior blood products seen on head scan, and history of hematuria, recommend close ongoing monitoring per neurology, oncology, and urology and continuous reassessment of risks versus benefits of anticoagulation.  Vertigo --Continue meclizine per IM, neurology. No dizziness reported this AM.  AKI, resolved --Renal function improved. --Cr 0.91 with BUN 11. Cr 0.96 on 12/29/19.  --Suspected that earlier decreased renal function 2/2 prerenal AKI and UTI. --Caution with BB/CCB/diuretics - avoid prerenal AKI. --Daily BMET. --Ultrasound of kidneys as above with R kidney stone but not obstructing.   Anemia --Hemoglobin 10.6 with hematocrit 34.6.   --Baseline Hgb low and 8.0-12.6 in the setting of her comorbid conditions, including her bladder CA. --Transfuse below Hgb 8.0 per IM.   --Monitor hemoglobin closely with restart of Eliquis 5 mg twice daily.  H/o urothelial carcinoma --Per oncology, okay to restart anticoagulation.  UTI --Per IM/ID.  --Continue antibiotics. --Infection likely exacerbating ventricular rates.   DM2 --A1C 6.0 on 10/14. --Ongoing glycemic control recommended with SSI.     For questions or updates, please contact [place] Please consult [email or web address] for contact info under     Signed, Arvil Chaco, PA-C  02/19/2020 7:42 AM

## 2020-02-19 NOTE — Progress Notes (Signed)
PT Cancellation Note  Patient Details Name: Elmira Olkowski MRN: [id number] DOB: [birth year]   Cancelled Treatment:     Chart review completed following new MD orders. Upon speaking with nurse, nurse recommends holding therapy at this time as nurse is trying to wean pt off medication drip to oral meds & pt's HR is erratic & fluctuating with minimal movement from bed level.  Will f/u with pt as medically appropriate.  Waunita Schooner 02/19/2020, 3:21 PM

## 2020-02-20 DIAGNOSIS — N179 Acute kidney failure, unspecified: Secondary | ICD-10-CM | POA: Diagnosis not present

## 2020-02-20 DIAGNOSIS — I5032 Chronic diastolic (congestive) heart failure: Secondary | ICD-10-CM | POA: Diagnosis not present

## 2020-02-20 DIAGNOSIS — R42 Dizziness and giddiness: Secondary | ICD-10-CM | POA: Diagnosis not present

## 2020-02-20 DIAGNOSIS — I4891 Unspecified atrial fibrillation: Secondary | ICD-10-CM | POA: Diagnosis not present

## 2020-02-20 LAB — GLUCOSE, CAPILLARY
Glucose-Capillary: 112 mg/dL — ABNORMAL HIGH (ref 70–99)
Glucose-Capillary: 123 mg/dL — ABNORMAL HIGH (ref 70–99)
Glucose-Capillary: 128 mg/dL — ABNORMAL HIGH (ref 70–99)
Glucose-Capillary: 129 mg/dL — ABNORMAL HIGH (ref 70–99)
Glucose-Capillary: 134 mg/dL — ABNORMAL HIGH (ref 70–99)
Glucose-Capillary: 141 mg/dL — ABNORMAL HIGH (ref 70–99)

## 2020-02-20 MED ORDER — METOPROLOL SUCCINATE ER 25 MG PO TB24
12.5000 mg | ORAL_TABLET | Freq: Two times a day (BID) | ORAL | Status: DC
  Administered 2020-02-20 – 2020-02-22 (×5): 12.5 mg via ORAL
  Filled 2020-02-20 (×5): qty 1

## 2020-02-20 MED ORDER — SALINE SPRAY 0.65 % NA SOLN
1.0000 | NASAL | Status: DC | PRN
  Filled 2020-02-20: qty 44

## 2020-02-20 NOTE — Progress Notes (Addendum)
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 She presented to the ED 10.13 for weakness and dizziness. Hospice was not notified prior to transport. Patient is a DNR and this is a related admission.  Visited with patient at bedside. She stated she had a rough night and did not sleep well. Lovena Le, RN was present and she stated that patient was not able to wean during the night from the Cardizem drip due to erratic HR. Metoprolol PO has been started as well. Denies any pain or distress. RN asked if patient would like for me to place a call to her son to update him, but she said she had already spoken to him and that he was asleep now. Assured that we would continue to follow and keep everyone updated.  VS: BP 114/74, HR 90, Resp 21, Temp 97.8, O2 sats is 94% on RA  I&O: 152ml/1300ml  Abnormal labs: Calcium: 8.0 (L) WBC: 10.6 (H) Hemoglobin: 10.6 (L) HCT: 34.6 (L) RDW: 15.9 (H)  Diagnostics: none today  IV/PRN Meds: diltiazem (CARDIZEM) 125 mg in dextrose 5% 125 mL (1 mg/mL) infusion Rate: 5-15 mL/hr Dose: 5-15 mg/hr Freq: Titrated Route: IV Last Dose: 5 mg/hr (02/20/20 1109)  Problem List: Active Problems:   Atrial fibrillation with RVR (HCC)   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HCC)   Essential hypertension   Type 2 diabetes mellitus without complication, without long-term current use of insulin (HCC)   Morbid obesity with BMI of 45.0-49.9, adult (HCC)   Chronic diastolic CHF (congestive heart failure) (HCC)   Endometrial cancer (HCC)   COPD with acute bronchitis (HCC)   Vertigo   Dehydration   AKI (acute kidney injury) ([place])   Cerebellar stroke ([place])   Metastatic carcinoma ([place])  69 y.o.femalewith medical history significant of Bladder cancer s/p transurethral resection of bladder tumorwith secondary malignant  neoplasm of liver, stroke, congestive heart failure, atrial fibrillationnot on anticoagultion, diabetes, hypertension, chronic pain.Acquired thrombophilia endometrial mass, morbid obesity frequent falls, COPD, indwelling catheter, mediastinal mass admitted for AKI, dehydration, dizziness.Went into afib rvr.  Dizziness/Vertigo- Has some vertigo sx , also with stroke found on Mri, and afib rvr, likely multifactorial Continue with meclizine Continue rate control for A. fib  Acute /early subacute infarct-  On MRI found with acute/early subacute infarct within the left frontoparietal subcortical white matter and an acute infarct within the right cerebellar hemisphere, please see full MRI result  -Neurology following -stroke likely due to her A. fib, anticoagulation was started  Clinically she is doing better  Carotids no significant stenosis  PT OT recommend SNF  Previously not on anticoagulation due to hematuria prior to her surgery  Target fasting lipid less than 70  Urothelial carcinoma of bladder(HCC) widely metastatic - -Oncology following-no plans for systemic treatment and should continue hospice services Palliative was consulted, needs rehab with palliative care and once goes back home needs hospice care Case management may be having a hard time with placing patient to SNF, will have hospice come back to discuss home with hospice if possible while case management looking for long-term placement  Essential hypertension- Stable.  Beta blk, cardizem p.o. was held due to patient going into A. fib RVR and being placed on Cardizem drip  We will start p.o. Cardizem 30 mg every 6 hours with trying to taper off of Cardizem drip   Endometrial cancer [facility])- Oncology  as above  acute on Chronic diastolic CHF(congestive heart failure) (HCC) - Mildly volume overloaded. Was off diuretics. Likely afib rvr exacerbated this Will give gentle diuresis as prn, lasix 20iv x1  Atrial  fibrillation with RVR (HCC)-chronic 10/17rate not controlled overnight, and IV dig x2 still rate not controlled 10/18-heart rate still variable better controlled than yesterday.   Will start Cardizem p.o. 30 mg every 6 hours and see if we can taper down Cardizem drip  Continue anticoagulation  Cardiology following -avoid amiodarone since patient is not therapeutically anticoagulated and risk of thromboembolic event from pharmacologic conversion still present  COPDwith hx/o bronchitis ([place]) -currently without exacerbation Avoid needs treatment currently due to A. fib RVR   Dehydration-was rehydrated Resolved Recommend hydration p.o.  AKI (acute kidney injury) ([place])-- likely secondary to dehydration Resolved with IV fluid for hydration Creatinine today is 0.91 Continue to periodically monitor  UTi - pt with indwelling catheter, unsure if true infection vs colonization Urine culture with mixed species needs recollection Completed abx with Rocephin.  Discharge Planning: Ongoing SNF vs LTC  Family Contact:  IDG: Updated  Goals of Care: Altha Harm, [place] completed  Please call with any hospice related questions or concerns.  Zandra Abts, RN [facility] Liaison [phone]

## 2020-02-20 NOTE — Evaluation (Addendum)
Physical Therapy Re-Evaluation Patient Details Name: Brittney Shepard MRN: [id number] DOB: [birth year] Today's Date: 02/20/2020   History of Present Illness  69 y.o. female with PMH significant for Bladder CA s/p transurethral resection of bladder tumor  with secondary malignant neoplasm of liver, stroke, CHF, a-fib not on anticoagulation, DM, HTN, chronic pain.Acquired thrombophilia  endometrial mass, morbid obesity frequent falls, COPD, indwelling catheter, mediastinal mass.  Here with weakness, dizziness. MRI revealed acute R cerebellar infarct & an acute/subacute L frontoparietal subcortical white matter infarct likely embolic & related to a-fib.  Clinical Impression  Consult received & PT re-evaluation completed. Nurse cleared pt for participation in PT as pt's HR is more stable today. Pt constantly monitored throughout session & HR ranged 77-110 bpm. Pt on room air, reporting she doesn't need supplemental oxygen & SpO2 >90% on room air except for 2 instances of dropping but lowest reading was 85% but recovered to >90% within seconds so question pulse ox error - nurse made aware & pt left on room air at end of session. Pt only c/o slight SOB after taking a sip of water.  Pt is willing to participate in session & attempts to come to sitting EOB but is limited in all mobility 2/2 dizziness with minimal movement but pt receiving meclizine. After attempting supine>sit pt returns to supine & performs BLE exercises including: ankle pumps, heel slides (AAROM LLE), and hip adduction squeezes with instructional cuing for technique. Pt does demonstrate LLE weakness>RLE and overall generalized deconditioning.  At this time, pt's current goals remain appropriate. Pt would benefit from continued skilled PT treatment to focus on activity & OOB tolerance & to increase independence with all mobility. Pt will require 24 hr assist at d/c and at this time recommending SNF level of care.    Follow Up Recommendations  SNF;Supervision/Assistance - 24 hour    Equipment Recommendations  None recommended by PT    Recommendations for Other Services       Precautions / Restrictions Precautions Precautions: Fall Restrictions Weight Bearing Restrictions: No      Mobility  Bed Mobility Overal bed mobility: Needs Assistance       Supine to sit: Max assist;HOB elevated Sit to supine: Max assist;HOB elevated   General bed mobility comments: Pt attempts supine>sit and is able to scoot BLE to EOB and attempts to upright trunk by holding to bed rails with UE (PT educates pt on need for exhalation with exertion vs holding breath) but pt unable to complete movement 2/2 dizziness with minimal movement. Pt requires assist to place BLE back to center of bed.  Transfers                    Ambulation/Gait                Stairs            Wheelchair Mobility    Modified Rankin (Stroke Patients Only)       Balance                                             Pertinent Vitals/Pain Pain Assessment: No/denies pain    Home Living   Living Arrangements: Alone Available Help at Discharge: Personal care attendant (hospice care aides for a "few" hours QD)   Home Access: Ramped entrance       Home Equipment:  Walker - 2 wheels      Prior Function Level of Independence: Needs assistance   Gait / Transfers Assistance Needed: pt reports getting around home with RW by herself  ADL's / Homemaking Assistance Needed: Reports having hospice caregivers that help briefly each day with bathing (reports bed baths, states her shower is broken) and light housework including dishes/laundry.        Hand Dominance   Dominant Hand: Right    Extremity/Trunk Assessment   Upper Extremity Assessment Upper Extremity Assessment: Generalized weakness    Lower Extremity Assessment Lower Extremity Assessment: Generalized weakness       Communication   Communication: No  difficulties  Cognition Arousal/Alertness: Awake/alert Behavior During Therapy: WFL for tasks assessed/performed Overall Cognitive Status: Within Functional Limits for tasks assessed                                        General Comments General comments (skin integrity, edema, etc.): Pt with c/o dizziness with minimal movement (attempting bed mobility, when PT changes position of HOB) but symptoms resolve quickly.    Exercises     Assessment/Plan    PT Assessment Patient needs continued PT services  PT Problem List Decreased strength;Decreased range of motion;Decreased activity tolerance;Decreased balance;Decreased mobility;Decreased coordination;Decreased knowledge of use of DME;Decreased safety awareness;Cardiopulmonary status limiting activity       PT Treatment Interventions DME instruction;Stair training;Gait training;Functional mobility training;Therapeutic activities;Therapeutic exercise;Balance training;Neuromuscular re-education;Patient/family education    PT Goals (Current goals can be found in the Care Plan section)  Acute Rehab PT Goals Patient Stated Goal: none stated PT Goal Formulation: With patient Time For Goal Achievement: 02/29/20 Potential to Achieve Goals: Fair    Frequency 7X/week   Barriers to discharge Decreased caregiver support      Co-evaluation               AM-PAC PT "6 Clicks" Mobility  Outcome Measure Help needed turning from your back to your side while in a flat bed without using bedrails?: A Lot Help needed moving from lying on your back to sitting on the side of a flat bed without using bedrails?: A Lot Help needed moving to and from a bed to a chair (including a wheelchair)?: Total Help needed standing up from a chair using your arms (e.g., wheelchair or bedside chair)?: Total Help needed to walk in hospital room?: Total Help needed climbing 3-5 steps with a railing? : Total 6 Click Score: 8    End of Session    Activity Tolerance: Patient limited by fatigue Patient left: in bed;with call bell/phone within reach;with SCD's reapplied;with bed alarm set Nurse Communication: Mobility status (vitals (HR, O2)) PT Visit Diagnosis: Muscle weakness (generalized) (M62.81);Difficulty in walking, not elsewhere classified (R26.2);Unsteadiness on feet (R26.81);Dizziness and giddiness (R42)    Time: 6579-0383 PT Time Calculation (min) (ACUTE ONLY): 24 min   Charges:   PT Evaluation $PT Eval Moderate Complexity: 1 Mod PT Treatments $Therapeutic Activity: 8-22 mins        Lavone Nian, PT, DPT 02/20/20, 1:01 PM

## 2020-02-20 NOTE — Plan of Care (Signed)
Cardizem gtt infusing at 10 mg/hr. Attempted to wean gtt over night but unsuccessful due to HR being sporadic.  Problem: Education: Goal: Knowledge of General Education information will improve Description: Including pain rating scale, medication(s)/side effects and non-pharmacologic comfort measures Outcome: Progressing   Problem: Activity: Goal: Risk for activity intolerance will decrease Outcome: Progressing   Problem: Pain Managment: Goal: General experience of comfort will improve Outcome: Progressing   Problem: Safety: Goal: Ability to remain free from injury will improve Outcome: Progressing   Problem: Skin Integrity: Goal: Risk for impaired skin integrity will decrease Outcome: Progressing

## 2020-02-20 NOTE — Progress Notes (Signed)
PROGRESS NOTE    Brittney Shepard  JGG:[id number] DOB: 1950-07-22 DOA: 02/14/2020 PCP: Marguerita Merles, MD    Brief Narrative:  Brittney Shepard is a 69 y.o. female with medical history significant of Bladder cancer s/p transurethral resection of bladder tumorwith secondary malignant neoplasm of liver, stroke, congestive heart failure, atrial fibrillation not on anticoagultion, diabetes, hypertension, chronic pain.Acquired thrombophilia endometrial mass, morbid obesity frequent falls, COPD, indwelling catheter, mediastinal mass Presented with   feeling dizziness with movement for past 1 day and had an episode of vomiting. Describes vertigo-like sensation which is worse when she is trying to move.  10/15- had afib rvr on tele.  10/16- HR improved some.  10/17: overnight with afib RVR, given IV digoxin x2 and fluid bolus by night team 10/18: tachycardia better, HR 90-105 at rest only. Has not ambulated yet. 10/19-HR better at rest but still at times jumps to 120's while watching monitor.   Consultants:   Oncology, neurology, cardiology  Procedures:  CT head Remote infarcts. Senescent changes. No evidence of acute intracranial hemorrhage or infarct.    Antimicrobials:       Subjective: Breathing a little better   Objective: Vitals:   02/19/20 2232 02/20/20 0001 02/20/20 0400 02/20/20 0601  BP: 122/65 129/65 (!) 132/58 132/78  Pulse:  85 99   Resp:  20 20   Temp:  97.9 F (36.6 C) 98 F (36.7 C)   TempSrc:  Oral Oral   SpO2:  94% 95%   Weight:   119.2 kg   Height:        Intake/Output Summary (Last 24 hours) at 02/20/2020 0734 Last data filed at 02/20/2020 0604 Gross per 24 hour  Intake --  Output 1300 ml  Net -1300 ml   Filed Weights   02/15/20 1700 02/19/20 0407 02/20/20 0400  Weight: 118.3 kg 120.7 kg 119.2 kg    Examination: In bed, not tachypneic, calm breathing appears to be better than yesterday  Scattered rails bilaterally, no rhonchi or  wheezing   irregular, S1-S2 no murmurs rubs gallops  Benign positive bowel sounds  Mild edema bilaterally, no cyanosis  AA x O x3  Mood and affect stable for current setting      Data Reviewed: I have personally reviewed following labs and imaging studies  CBC: Recent Labs  Lab 02/14/20 1949 02/15/20 0501 02/18/20 0438 02/19/20 0248  WBC 16.0* 10.7* 10.8* 10.6*  NEUTROABS  --  8.9*  --   --   HGB 12.3 11.2* 11.3* 10.6*  HCT 39.6 35.2* 36.3 34.6*  MCV 86.5 87.6 86.4 87.8  PLT 430* 326 341 546   Basic Metabolic Panel: Recent Labs  Lab 02/14/20 1949 02/15/20 0501 02/16/20 0458 02/19/20 0248  NA 135 137 141 137  K 4.7 4.6 4.1 4.1  CL 102 106 108 106  CO2 20* 22 26 23   GLUCOSE 209* 152* 101* 92  BUN 21 20 16 11   CREATININE 1.54* 1.31* 1.10* 0.91  CALCIUM 8.0* 7.6* 8.0* 8.0*  MG  --  2.0  --   --   PHOS  --  3.8  --   --    GFR: Estimated Creatinine Clearance: 67.1 mL/min (by C-G formula based on SCr of 0.91 mg/dL). Liver Function Tests: Recent Labs  Lab 02/15/20 0501  AST 13*  ALT 7  ALKPHOS 64  BILITOT 1.0  PROT 6.5  ALBUMIN 2.8*   No results for input(s): LIPASE, AMYLASE in the last 168 hours. No results for  input(s): AMMONIA in the last 168 hours. Coagulation Profile: No results for input(s): INR, PROTIME in the last 168 hours. Cardiac Enzymes: No results for input(s): CKTOTAL, CKMB, CKMBINDEX, TROPONINI in the last 168 hours. BNP (last 3 results) No results for input(s): PROBNP in the last 8760 hours. HbA1C: No results for input(s): HGBA1C in the last 72 hours. CBG: Recent Labs  Lab 02/19/20 1152 02/19/20 1605 02/19/20 2100 02/20/20 0004 02/20/20 0427  GLUCAP 135* 124* 115* 128* 129*   Lipid Profile: No results for input(s): CHOL, HDL, LDLCALC, TRIG, CHOLHDL, LDLDIRECT in the last 72 hours. Thyroid Function Tests: No results for input(s): TSH, T4TOTAL, FREET4, T3FREE, THYROIDAB in the last 72 hours. Anemia Panel: No results for  input(s): VITAMINB12, FOLATE, FERRITIN, TIBC, IRON, RETICCTPCT in the last 72 hours. Sepsis Labs: No results for input(s): PROCALCITON, LATICACIDVEN in the last 168 hours.  Recent Results (from the past 240 hour(s))  Urine culture     Status: Abnormal   Collection Time: 02/14/20  7:49 PM   Specimen: Urine, Random  Result Value Ref Range Status   Specimen Description   Final    URINE, RANDOM Performed at [facility], [address] [place], [place] [address]    Special Requests   Final    NONE Performed at [facility], [address] [place], [place] [address]    Culture MULTIPLE SPECIES PRESENT, SUGGEST RECOLLECTION (A)  Final   Report Status 02/16/2020 FINAL  Final  Respiratory Panel by RT PCR (Flu A&B, Covid) - Nasopharyngeal Swab     Status: None   Collection Time: 02/14/20  9:45 PM   Specimen: Nasopharyngeal Swab  Result Value Ref Range Status   SARS Coronavirus 2 by RT PCR NEGATIVE NEGATIVE Final    Comment: (NOTE) SARS-CoV-2 target nucleic acids are NOT DETECTED.  The SARS-CoV-2 RNA is generally detectable in upper respiratoy specimens during the acute phase of infection. The lowest concentration of SARS-CoV-2 viral copies this assay can detect is 131 copies/mL. A negative result does not preclude SARS-Cov-2 infection and should not be used as the sole basis for treatment or other patient management decisions. A negative result may occur with  improper specimen collection/handling, submission of specimen other than nasopharyngeal swab, presence of viral mutation(s) within the areas targeted by this assay, and inadequate number of viral copies (<131 copies/mL). A negative result must be combined with clinical observations, patient history, and epidemiological information. The expected result is Negative.  Fact Sheet for Patients:  PinkCheek.be  Fact Sheet for Healthcare Providers:   GravelBags.it  This test is no t yet approved or cleared by the [place] FDA and  has been authorized for detection and/or diagnosis of SARS-CoV-2 by FDA under an Emergency Use Authorization (EUA). This EUA will remain  in effect (meaning this test can be used) for the duration of the COVID-19 declaration under Section 564(b)(1) of the Act, 21 U.S.C. section 360bbb-3(b)(1), unless the authorization is terminated or revoked sooner.     Influenza A by PCR NEGATIVE NEGATIVE Final   Influenza B by PCR NEGATIVE NEGATIVE Final    Comment: (NOTE) The Xpert Xpress SARS-CoV-2/FLU/RSV assay is intended as an aid in  the diagnosis of influenza from Nasopharyngeal swab specimens and  should not be used as a sole basis for treatment. Nasal washings and  aspirates are unacceptable for Xpert Xpress SARS-CoV-2/FLU/RSV  testing.  Fact Sheet for Patients: PinkCheek.be  Fact Sheet for Healthcare Providers: GravelBags.it  This test is not yet  approved or cleared by the [place] and  has been authorized for detection and/or diagnosis of SARS-CoV-2 by  FDA under an Emergency Use Authorization (EUA). This EUA will remain  in effect (meaning this test can be used) for the duration of the  Covid-19 declaration under Section 564(b)(1) of the Act, 21  U.S.C. section 360bbb-3(b)(1), unless the authorization is  terminated or revoked. Performed at [facility], [address] [place], [place] [address]          Radiology Studies: No results found.      Scheduled Meds: . apixaban  5 mg Oral BID  . Chlorhexidine Gluconate Cloth  6 each Topical Daily  . darifenacin  7.5 mg Oral Daily  . diltiazem  30 mg Oral Q6H  . influenza vaccine adjuvanted  0.5 mL Intramuscular Tomorrow-1000  . insulin aspart  0-9 Units Subcutaneous Q4H  . meclizine  25 mg Oral BID   Continuous Infusions: .  sodium chloride 10 mL/hr at 02/18/20 0516  . diltiazem (CARDIZEM) infusion 10 mg/hr (02/19/20 2231)    Assessment & Plan:   Active Problems:   Atrial fibrillation with RVR (HCC)   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HCC)   Essential hypertension   Type 2 diabetes mellitus without complication, without long-term current use of insulin (HCC)   Morbid obesity with BMI of 45.0-49.9, adult (HCC)   Chronic diastolic CHF (congestive heart failure) (HCC)   Endometrial cancer (HCC)   COPD with acute bronchitis (HCC)   Vertigo   Dehydration   AKI (acute kidney injury) ([place])   Cerebellar stroke ([place])   Metastatic carcinoma ([place])   69 y.o. female with medical history significant of Bladder cancer s/p transurethral resection of bladder tumorwith secondary malignant neoplasm of liver, stroke, congestive heart failure, atrial fibrillation not on anticoagultion, diabetes, hypertension, chronic pain.Acquired thrombophilia endometrial mass, morbid obesity frequent falls, COPD, indwelling catheter, mediastinal mass admitted for AKI, dehydration, dizziness.Went into afib rvr.  Dizziness/Vertigo- Has some vertigo sx , also with stroke found on Mri, and afib rvr, likely multifactorial Continue with meclizine  Need to better control the heart rate as it causes lightheadedness when she is tachycardic      Acute /early subacute infarct-  On MRI found with acute/early subacute infarct within the left frontoparietal subcortical white matter and an acute infarct within the right cerebellar hemisphere, please see full MRI result  -Neurology following -stroke likely due to her A. fib, anticoagulation was started  Carotids no significant stenosis  10/19-Clinically improving PT OT recommends SNF Started on anticoagulation Previously she was not on anticoagulation in the past due to hematuria prior to her surgery.  But she has been placed on it now FLP goal <70  PT OT recommend SNF  Previously not on  anticoagulation due to hematuria prior to her surgery  Target fasting lipid less than 70    . 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[place]) widely metastatic - -oncology following-no plans for systemic treatment and should continue hospice services  Palliative was consulted, needs rehab with palliative care and once goes back home needs hospice care -Please see palliative care note    . Essential hypertension - Stable  Continue Cardizem and will add low-dose Toprol-XL as tolerated which will help with her heart rate to      . Endometrial cancer [facility]) - Oncology as above   . acute on Chronic diastolic CHF (congestive heart failure) ([place]) - Was mildly volume overloaded, improving.  Need to give diuretics as  needed Likely A. fib RVR exacerbated this Monitor weight, I's and O's    . Atrial fibrillation with RVR (HCC) -chronic 10/17rate not controlled overnight, and IV dig x2 still rate not controlled 10/18-heart rate still variable better controlled than yesterday.   10/19-still HR not well controlled at rest.  Having a difficult time weaning her down from Cardizem drip. We will continue Cardizem p.o. and drip until we are able to wean her down We will add Toprol-XL 12.5 mg twice daily increase as tolerated Continue anticoagulation   . COPD with hx/o bronchitis ([place]) -currently without exacerbation Avoid needs treatment currently due to A. fib RVR     . Dehydration -was rehydrated Resolved Courage p.o. intake  . AKI (acute kidney injury) ([place]) - - likely secondary to dehydration Resolved with IV fluid for hydration Creatinine today is 0.91 Continue to periodically monitor   UTi - pt with indwelling catheter, unsure if true infection vs colonization Urine culture with mixed species needs recollection Completed abx with Rocephin.   DVT prophylaxis: Eliquis Code Status:dnr Family Communication: none at bedside  Status is: inpatient  Patient remains as inpatient  due to severity of illness requiring hospitalizatoin.   Dispo: The patient is from: Home              Anticipated d/c is to: SNF with palliative care.              Anticipated d/c date is: 2              Patient currently is not medically stable as need heart better controlled.  Also SNF pending          LOS: 4 days   Time spent: 45 min with >50% on coc    Nolberto Hanlon, MD [facility] Pager [phone]  If 7PM-7AM, please contact night-coverage [email or web address] Password [facility] 02/20/2020, 7:34 AM

## 2020-02-21 ENCOUNTER — Encounter: Payer: Self-pay | Admitting: Internal Medicine

## 2020-02-21 DIAGNOSIS — I4891 Unspecified atrial fibrillation: Secondary | ICD-10-CM | POA: Diagnosis not present

## 2020-02-21 DIAGNOSIS — C799 Secondary malignant neoplasm of unspecified site: Secondary | ICD-10-CM | POA: Diagnosis not present

## 2020-02-21 LAB — GLUCOSE, CAPILLARY
Glucose-Capillary: 104 mg/dL — ABNORMAL HIGH (ref 70–99)
Glucose-Capillary: 111 mg/dL — ABNORMAL HIGH (ref 70–99)
Glucose-Capillary: 111 mg/dL — ABNORMAL HIGH (ref 70–99)
Glucose-Capillary: 116 mg/dL — ABNORMAL HIGH (ref 70–99)
Glucose-Capillary: 130 mg/dL — ABNORMAL HIGH (ref 70–99)
Glucose-Capillary: 131 mg/dL — ABNORMAL HIGH (ref 70–99)
Glucose-Capillary: 97 mg/dL (ref 70–99)

## 2020-02-21 LAB — BASIC METABOLIC PANEL
Anion gap: 6 (ref 5–15)
BUN: 11 mg/dL (ref 8–23)
CO2: 27 mmol/L (ref 22–32)
Calcium: 8 mg/dL — ABNORMAL LOW (ref 8.9–10.3)
Chloride: 104 mmol/L (ref 98–111)
Creatinine, Ser: 0.84 mg/dL (ref 0.44–1.00)
GFR, Estimated: >60 mL/min (ref >60)
Glucose, Bld: 104 mg/dL — ABNORMAL HIGH (ref 70–99)
Potassium: 3.7 mmol/L (ref 3.5–5.1)
Sodium: 137 mmol/L (ref 135–145)

## 2020-02-21 MED ORDER — DILTIAZEM HCL 30 MG PO TABS
30.0000 mg | ORAL_TABLET | Freq: Once | ORAL | Status: AC
  Administered 2020-02-21: 30 mg via ORAL
  Filled 2020-02-21: qty 1

## 2020-02-21 MED ORDER — INSULIN ASPART 100 UNIT/ML ~~LOC~~ SOLN
0.0000 [IU] | Freq: Three times a day (TID) | SUBCUTANEOUS | Status: DC
  Administered 2020-02-22 (×3): 1 [IU] via SUBCUTANEOUS
  Filled 2020-02-21 (×2): qty 1

## 2020-02-21 MED ORDER — DILTIAZEM HCL 30 MG PO TABS
60.0000 mg | ORAL_TABLET | Freq: Four times a day (QID) | ORAL | Status: DC
  Administered 2020-02-21 – 2020-02-22 (×4): 60 mg via ORAL
  Filled 2020-02-21 (×4): qty 2

## 2020-02-21 NOTE — Progress Notes (Signed)
Patient's O2 sats dropped to 79%.  Tried to encourage patient to keep O2 on.   Placed humidifier on to help with comfort

## 2020-02-21 NOTE — Progress Notes (Addendum)
[place] [facility] patient RN note:  Patient is a current hospice patient with a terminal diagnosis of bladder cancer with metastases to liver. She presented to the ED 10.13 for weakness and dizziness. Hospice was not notified prior to transport. Patient is a DNR and this is a related admission.  Visited with patient in room. She is lying in bed, alert and oriented. Denies any discomfort. States she has had some dizziness this morning otherwise she is fine. Cardizem drip was weaned during the night and is down to 2.5mg /hr.  VS: BP 119/76, HR 82, Resp 19, Temp 97.8, O2 sat 95% on RA  I&O: 151ml/300ml  Abnormal labs: Glucose: 104 (H) Calcium: 8.0 (L)  Diagnostics: none today  IV/PRN Meds: diltiazem (CARDIZEM) 125 mg in dextrose 5% 125 mL (1 mg/mL) infusion Rate: 5-15 mL/hr Dose: 5-15 mg/hr Freq: Titrated Route: IV Last Dose: 2.5 mg/hr (02/21/20 1106  Problem List: Active Problems:   Atrial fibrillation with RVR (HCC)   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HCC)   Essential hypertension   Type 2 diabetes mellitus without complication, without long-term current use of insulin (HCC)   Morbid obesity with BMI of 45.0-49.9, adult (HCC)   Chronic diastolic CHF (congestive heart failure) (HCC)   Endometrial cancer (HCC)   COPD with acute bronchitis (HCC)   Vertigo   Dehydration   AKI (acute kidney injury) ([place])   Cerebellar stroke ([place])   Metastatic carcinoma ([place])  69 y.o.femalewith medical history significant of Bladder cancer s/p transurethral resection of bladder tumorwith secondary malignant neoplasm of liver, stroke, congestive heart failure, atrial fibrillationnot on anticoagultion, diabetes, hypertension, chronic pain.Acquired thrombophilia endometrial mass, morbid obesity frequent falls, COPD, indwelling catheter, mediastinal mass admitted for AKI, dehydration, dizziness.Went into afib  rvr.  Dizziness/Vertigo- Has some vertigo sx , also with stroke found on Mri, and afib rvr, likely multifactorial Continue with meclizine  Need to better control the heart rate as it causes lightheadedness when she is tachycardic   Acute /early subacute infarct-  On MRI found with acute/early subacute infarct within the left frontoparietal subcortical white matter and an acute infarct within the right cerebellar hemisphere, please see full MRI result  -Neurology following -stroke likely due to her A. fib, anticoagulation was started  Carotids no significant stenosis  10/19-Clinically improving PT OT recommends SNF Started on anticoagulation Previously she was not on anticoagulation in the past due to hematuria prior to her surgery.  But she has been placed on it now FLP goal <70  PT OT recommend SNF  Previously not on anticoagulation due to hematuria prior to her surgery  Target fasting lipid less than 70  .Urothelial carcinoma of bladder(HCC) widely metastatic - -oncology following-no plans for systemic treatment and should continue hospice services  Palliative was consulted, needs rehab with palliative care and once goes back home needs hospice care -Please see palliative care note  .Essential hypertension- Stable  Continue Cardizem and will add low-dose Toprol-XL as tolerated which will help with her heart rate to   .Endometrial cancer [facility])- Oncology as above  .acute on Chronic diastolic CHF(congestive heart failure) ([place]) - Was mildly volume overloaded, improving.  Need to give diuretics as needed Likely A. fib RVR exacerbated this Monitor weight, I's and O's  .Atrial fibrillation with RVR (HCC)-chronic 10/17rate not controlled overnight, and IV dig x2 still rate not controlled 10/18-heart rate still variable better controlled than yesterday.   10/19-still HR not well controlled at rest.  Having a  difficult time weaning her down from Cardizem drip. We  will continue Cardizem p.o. and drip until we are able to wean her down We will add Toprol-XL 12.5 mg twice daily increase as tolerated Continue anticoagulation  .COPDwith hx/o bronchitis ([place]) -currently without exacerbation Avoid needs treatment currently due to A. fib RVR   .Dehydration-was rehydrated Resolved Courage p.o. intake  .AKI (acute kidney injury) ([place])-- likely secondary to dehydration Resolved with IV fluid for hydration Creatinine today is 0.91 Continue to periodically monitor  UTi - pt with indwelling catheter, unsure if true infection vs colonization Urine culture with mixed species needs recollection Completed abx with Rocephin.  Discharge Planning: Ongoing -plan is for SNF  Family Contact: Patient said that she had spoke to her son and that he was working and I did not need to call him now.  IDG: Updated  Goals of Care: Clear  Please call with any hospice related questions or concerns.  Thank you.  Zandra Abts, RN [place] [phone]

## 2020-02-21 NOTE — Plan of Care (Signed)
Cardizem gtt infusing at 5 mg/hr. Pt HR much more controlled over night with addition of metoprolol and PO Cardizem.  Problem: Education: Goal: Knowledge of General Education information will improve Description: Including pain rating scale, medication(s)/side effects and non-pharmacologic comfort measures Outcome: Progressing   Problem: Activity: Goal: Risk for activity intolerance will decrease Outcome: Progressing   Problem: Pain Managment: Goal: General experience of comfort will improve Outcome: Progressing   Problem: Safety: Goal: Ability to remain free from injury will improve Outcome: Progressing   Problem: Skin Integrity: Goal: Risk for impaired skin integrity will decrease Outcome: Progressing   Problem: Education: Goal: Knowledge of disease or condition will improve Outcome: Progressing Goal: Understanding of medication regimen will improve Outcome: Progressing Goal: Individualized Educational Video(s) Outcome: Progressing   Problem: Activity: Goal: Ability to tolerate increased activity will improve Outcome: Progressing   Problem: Cardiac: Goal: Ability to achieve and maintain adequate cardiopulmonary perfusion will improve Outcome: Progressing   Problem: Health Behavior/Discharge Planning: Goal: Ability to safely manage health-related needs after discharge will improve Outcome: Progressing

## 2020-02-21 NOTE — Progress Notes (Signed)
PROGRESS NOTE    Brittney Shepard  PIR:[id number] DOB: 02-05-1951 DOA: 02/14/2020 PCP: Marguerita Merles, MD    Brief Narrative:  Brittney Shepard is a 69 y.o. female with medical history significant of Bladder cancer s/p transurethral resection of bladder tumorwith secondary malignant neoplasm of liver, stroke, congestive heart failure, atrial fibrillation not on anticoagultion, diabetes, hypertension, chronic pain.Acquired thrombophilia endometrial mass, morbid obesity frequent falls, COPD, indwelling catheter, mediastinal mass Presented with   feeling dizziness with movement for past 1 day and had an episode of vomiting. Describes vertigo-like sensation which is worse when she is trying to move.  10/15- had afib rvr on tele.  10/16- HR improved some.  10/17: overnight with afib RVR, given IV digoxin x2 and fluid bolus by night team 10/18: tachycardia better, HR 90-105 at rest only. Has not ambulated yet. 10/19-HR better at rest but still at times jumps to 120's while watching monitor.   Consultants:   Oncology, neurology, cardiology  Procedures:  CT head Remote infarcts. Senescent changes. No evidence of acute intracranial hemorrhage or infarct.    Antimicrobials:       Subjective: Pt's main complaint today was dizziness.  Also has small amount of urine leakage around a 2-day old Foley.   Objective: Vitals:   02/21/20 1114 02/21/20 1526 02/21/20 1820 02/21/20 1955  BP: 110/69 119/76 108/82 111/63  Pulse: 82  77 87  Resp: 19  (!) 24 (!) 24  Temp: 97.6 F (36.4 C) 97.8 F (36.6 C)  98.2 F (36.8 C)  TempSrc: Oral Oral  Oral  SpO2: 95%  100% 98%  Weight:      Height:        Intake/Output Summary (Last 24 hours) at 02/21/2020 2002 Last data filed at 02/21/2020 1654 Gross per 24 hour  Intake --  Output 350 ml  Net -350 ml   Filed Weights   02/20/20 0400 02/21/20 0307 02/21/20 0452  Weight: 119.2 kg 119.1 kg 119.6 kg    Examination: Constitutional: NAD,  AAOx3 HEENT: conjunctivae and lids normal, EOMI CV: No cyanosis.   RESP: normal respiratory effort, on 2L Extremities: No effusions, edema in BLE SKIN: warm, dry and intact Neuro: II - XII grossly intact.   Psych: Normal mood and affect.  Appropriate judgement and reason   Data Reviewed: I have personally reviewed following labs and imaging studies  CBC: Recent Labs  Lab 02/15/20 0501 02/18/20 0438 02/19/20 0248  WBC 10.7* 10.8* 10.6*  NEUTROABS 8.9*  --   --   HGB 11.2* 11.3* 10.6*  HCT 35.2* 36.3 34.6*  MCV 87.6 86.4 87.8  PLT 326 341 630   Basic Metabolic Panel: Recent Labs  Lab 02/15/20 0501 02/16/20 0458 02/19/20 0248 02/21/20 0353  NA 137 141 137 137  K 4.6 4.1 4.1 3.7  CL 106 108 106 104  CO2 22 26 23 27   GLUCOSE 152* 101* 92 104*  BUN 20 16 11 11   CREATININE 1.31* 1.10* 0.91 0.84  CALCIUM 7.6* 8.0* 8.0* 8.0*  MG 2.0  --   --   --   PHOS 3.8  --   --   --    GFR: Estimated Creatinine Clearance: 72.8 mL/min (by C-G formula based on SCr of 0.84 mg/dL). Liver Function Tests: Recent Labs  Lab 02/15/20 0501  AST 13*  ALT 7  ALKPHOS 64  BILITOT 1.0  PROT 6.5  ALBUMIN 2.8*   No results for input(s): LIPASE, AMYLASE in the last 168 hours. No  results for input(s): AMMONIA in the last 168 hours. Coagulation Profile: No results for input(s): INR, PROTIME in the last 168 hours. Cardiac Enzymes: No results for input(s): CKTOTAL, CKMB, CKMBINDEX, TROPONINI in the last 168 hours. BNP (last 3 results) No results for input(s): PROBNP in the last 8760 hours. HbA1C: No results for input(s): HGBA1C in the last 72 hours. CBG: Recent Labs  Lab 02/21/20 0028 02/21/20 0345 02/21/20 0742 02/21/20 1126 02/21/20 1653  GLUCAP 97 104* 111* 111* 116*   Lipid Profile: No results for input(s): CHOL, HDL, LDLCALC, TRIG, CHOLHDL, LDLDIRECT in the last 72 hours. Thyroid Function Tests: No results for input(s): TSH, T4TOTAL, FREET4, T3FREE, THYROIDAB in the last 72  hours. Anemia Panel: No results for input(s): VITAMINB12, FOLATE, FERRITIN, TIBC, IRON, RETICCTPCT in the last 72 hours. Sepsis Labs: No results for input(s): PROCALCITON, LATICACIDVEN in the last 168 hours.  Recent Results (from the past 240 hour(s))  Urine culture     Status: Abnormal   Collection Time: 02/14/20  7:49 PM   Specimen: Urine, Random  Result Value Ref Range Status   Specimen Description   Final    URINE, RANDOM Performed at [facility], [address] [place], [place] [address]    Special Requests   Final    NONE Performed at [facility], [address] [place], [place] [address]    Culture MULTIPLE SPECIES PRESENT, SUGGEST RECOLLECTION (A)  Final   Report Status 02/16/2020 FINAL  Final  Respiratory Panel by RT PCR (Flu A&B, Covid) - Nasopharyngeal Swab     Status: None   Collection Time: 02/14/20  9:45 PM   Specimen: Nasopharyngeal Swab  Result Value Ref Range Status   SARS Coronavirus 2 by RT PCR NEGATIVE NEGATIVE Final    Comment: (NOTE) SARS-CoV-2 target nucleic acids are NOT DETECTED.  The SARS-CoV-2 RNA is generally detectable in upper respiratoy specimens during the acute phase of infection. The lowest concentration of SARS-CoV-2 viral copies this assay can detect is 131 copies/mL. A negative result does not preclude SARS-Cov-2 infection and should not be used as the sole basis for treatment or other patient management decisions. A negative result may occur with  improper specimen collection/handling, submission of specimen other than nasopharyngeal swab, presence of viral mutation(s) within the areas targeted by this assay, and inadequate number of viral copies (<131 copies/mL). A negative result must be combined with clinical observations, patient history, and epidemiological information. The expected result is Negative.  Fact Sheet for Patients:  PinkCheek.be  Fact Sheet for Healthcare  Providers:  GravelBags.it  This test is no t yet approved or cleared by the [place] FDA and  has been authorized for detection and/or diagnosis of SARS-CoV-2 by FDA under an Emergency Use Authorization (EUA). This EUA will remain  in effect (meaning this test can be used) for the duration of the COVID-19 declaration under Section 564(b)(1) of the Act, 21 U.S.C. section 360bbb-3(b)(1), unless the authorization is terminated or revoked sooner.     Influenza A by PCR NEGATIVE NEGATIVE Final   Influenza B by PCR NEGATIVE NEGATIVE Final    Comment: (NOTE) The Xpert Xpress SARS-CoV-2/FLU/RSV assay is intended as an aid in  the diagnosis of influenza from Nasopharyngeal swab specimens and  should not be used as a sole basis for treatment. Nasal washings and  aspirates are unacceptable for Xpert Xpress SARS-CoV-2/FLU/RSV  testing.  Fact Sheet for Patients: PinkCheek.be  Fact Sheet for Healthcare Providers: GravelBags.it  This test is  not yet approved or cleared by the [place] and  has been authorized for detection and/or diagnosis of SARS-CoV-2 by  FDA under an Emergency Use Authorization (EUA). This EUA will remain  in effect (meaning this test can be used) for the duration of the  Covid-19 declaration under Section 564(b)(1) of the Act, 21  U.S.C. section 360bbb-3(b)(1), unless the authorization is  terminated or revoked. Performed at [facility], [address] [place], [place] [address]          Radiology Studies: No results found.      Scheduled Meds: . apixaban  5 mg Oral BID  . Chlorhexidine Gluconate Cloth  6 each Topical Daily  . darifenacin  7.5 mg Oral Daily  . diltiazem  60 mg Oral Q6H  . influenza vaccine adjuvanted  0.5 mL Intramuscular Tomorrow-1000  . insulin aspart  0-9 Units Subcutaneous TID WC  . meclizine  25 mg Oral BID  . metoprolol  succinate  12.5 mg Oral BID   Continuous Infusions: . sodium chloride 10 mL/hr at 02/18/20 0516  . diltiazem (CARDIZEM) infusion 2.5 mg/hr (02/21/20 1106)    Assessment & Plan:   Active Problems:   Atrial fibrillation with RVR (HCC)   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HCC)   Essential hypertension   Type 2 diabetes mellitus without complication, without long-term current use of insulin (HCC)   Morbid obesity with BMI of 45.0-49.9, adult (HCC)   Chronic diastolic CHF (congestive heart failure) (HCC)   Endometrial cancer (HCC)   COPD with acute bronchitis (HCC)   Vertigo   Dehydration   AKI (acute kidney injury) ([place])   Cerebellar stroke ([place])   Metastatic carcinoma ([place])   69 y.o. female with medical history significant of Bladder cancer s/p transurethral resection of bladder tumorwith secondary malignant neoplasm of liver, stroke, congestive heart failure, atrial fibrillation not on anticoagultion, diabetes, hypertension, chronic pain.Acquired thrombophilia endometrial mass, morbid obesity frequent falls, COPD, indwelling catheter, mediastinal mass admitted for AKI, dehydration, dizziness.Went into afib rvr.  Dizziness/Vertigo- Has some vertigo sx , also with stroke found on Mri, and afib rvr, likely multifactorial Continue with meclizine  Need to better control the heart rate as it causes lightheadedness when she is tachycardic    Acute /early subacute infarct-  On MRI found with acute/early subacute infarct within the left frontoparietal subcortical white matter and an acute infarct within the right cerebellar hemisphere, please see full MRI result  -Neurology following -stroke likely due to her A. fib, anticoagulation was started  Carotids no significant stenosis  10/19-Clinically improving PT OT recommends SNF Started on anticoagulation Previously she was not on anticoagulation in the past due to hematuria prior to her surgery.  But she has been placed on it now FLP  goal <70  PT OT recommend SNF  Previously not on anticoagulation due to hematuria prior to her surgery  Target fasting lipid less than 70    . 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[place]) widely metastatic - -oncology following-no plans for systemic treatment and should continue hospice services  Palliative was consulted, needs rehab with palliative care and once goes back home needs hospice care -Please see palliative care note    . Essential hypertension - Stable  Continue Cardizem and will add low-dose Toprol-XL as tolerated which will help with her heart rate to      . Endometrial cancer [facility]) - Oncology as above   . acute on Chronic diastolic CHF (congestive heart failure) ([place]) - Was  mildly volume overloaded, improving.  Need to give diuretics as needed Likely A. fib RVR exacerbated this Monitor weight, I's and O's    . Atrial fibrillation with RVR (HCC) -chronic 10/17rate not controlled overnight, and IV dig x2 still rate not controlled 10/18-heart rate still variable better controlled than yesterday.   10/19-still HR not well controlled at rest.  Having a difficult time weaning her down from Cardizem drip. We will continue Cardizem p.o. and drip until we are able to wean her down We will add Toprol-XL 12.5 mg twice daily increase as tolerated Continue anticoagulation --continue oral dilt 60 mg q6h   . COPD with hx/o bronchitis ([place]) -currently without exacerbation Avoid neb treatment currently due to A. fib RVR     . Dehydration -was rehydrated Resolved [place] p.o. intake  . AKI (acute kidney injury) ([place]) - - likely secondary to dehydration Resolved with IV fluid for hydration Creatinine today is 0.91 Continue to periodically monitor   UTi - pt with indwelling catheter, unsure if true infection vs colonization Urine culture with mixed species needs recollection Completed abx with Rocephin.   DVT prophylaxis: Eliquis Code Status:dnr Family  Communication:  Status is: inpatient  Patient remains as inpatient due to severity of illness requiring hospitalizatoin.   Dispo: The patient is from: Home              Anticipated d/c is to: SNF with palliative care.              Anticipated d/c date is: 2              Patient currently is not medically stable as still on dilt gtt.     LOS: 5 days      Enzo Bi, MD [facility] Pager [phone]

## 2020-02-21 NOTE — Progress Notes (Signed)
Progress Note  Patient Name: Brittney Shepard Date of Encounter: 02/21/2020  [facility] Cardiologist: Kate Sable, MD   Subjective   Doing okay, denies palpitations.  Shortness of breath improved.  Currently on IV diltiazem at 5 mg/h.  Also on oral diltiazem 30 mg every 6 hours.  Inpatient Medications    Scheduled Meds: . apixaban  5 mg Oral BID  . Chlorhexidine Gluconate Cloth  6 each Topical Daily  . darifenacin  7.5 mg Oral Daily  . diltiazem  60 mg Oral Q6H  . influenza vaccine adjuvanted  0.5 mL Intramuscular Tomorrow-1000  . insulin aspart  0-9 Units Subcutaneous TID WC  . meclizine  25 mg Oral BID  . metoprolol succinate  12.5 mg Oral BID   Continuous Infusions: . sodium chloride 10 mL/hr at 02/18/20 0516  . diltiazem (CARDIZEM) infusion 2.5 mg/hr (02/21/20 1106)   PRN Meds: sodium chloride, acetaminophen **OR** acetaminophen, metoCLOPramide (REGLAN) injection, sodium chloride   Vital Signs    Vitals:   02/21/20 0742 02/21/20 0800 02/21/20 0928 02/21/20 1114  BP: (!) 141/64 119/61 (!) 125/54 110/69  Pulse: 79 68 78 82  Resp: 20 (!) 21  19  Temp: 97.9 F (36.6 C)   97.6 F (36.4 C)  TempSrc: Oral   Oral  SpO2: 95% 96%  95%  Weight:      Height:        Intake/Output Summary (Last 24 hours) at 02/21/2020 1438 Last data filed at 02/21/2020 1125 Gross per 24 hour  Intake --  Output 450 ml  Net -450 ml   Last 3 Weights 02/21/2020 02/21/2020 02/20/2020  Weight (lbs) 263 lb 10.7 oz 262 lb 9.1 oz 262 lb 12.8 oz  Weight (kg) 119.6 kg 119.1 kg 119.205 kg      Telemetry    Atrial fibrillation heart rate 78- Personally Reviewed  ECG    No new tracing- Personally Reviewed  Physical Exam   GEN: No acute distress.   Neck: No JVD Cardiac:  Irregular irregular, distant heart sounds Respiratory:  Decreased breath sounds at bases GI: Soft, nontender, non-distended  MS:  Trace edema; No deformity. Neuro:  Nonfocal  Psych: Normal affect    Labs    High Sensitivity Troponin:   Recent Labs  Lab 02/14/20 1949  TROPONINIHS 6      Chemistry Recent Labs  Lab 02/15/20 0501 02/15/20 0501 02/16/20 0458 02/19/20 0248 02/21/20 0353  NA 137   < > 141 137 137  K 4.6   < > 4.1 4.1 3.7  CL 106   < > 108 106 104  CO2 22   < > 26 23 27   GLUCOSE 152*   < > 101* 92 104*  BUN 20   < > 16 11 11   CREATININE 1.31*   < > 1.10* 0.91 0.84  CALCIUM 7.6*   < > 8.0* 8.0* 8.0*  PROT 6.5  --   --   --   --   ALBUMIN 2.8*  --   --   --   --   AST 13*  --   --   --   --   ALT 7  --   --   --   --   ALKPHOS 64  --   --   --   --   BILITOT 1.0  --   --   --   --   GFRNONAA 41*   < > 50* >60 >60  ANIONGAP 9   < >  7 8 6    < > = values in this interval not displayed.     Hematology Recent Labs  Lab 02/15/20 0501 02/18/20 0438 02/19/20 0248  WBC 10.7* 10.8* 10.6*  RBC 4.02 4.20 3.94  HGB 11.2* 11.3* 10.6*  HCT 35.2* 36.3 34.6*  MCV 87.6 86.4 87.8  MCH 27.9 26.9 26.9  MCHC 31.8 31.1 30.6  RDW 15.7* 16.0* 15.9*  PLT 326 341 294    BNP Recent Labs  Lab 02/18/20 0438  BNP 380.9*     DDimer No results for input(s): DDIMER in the last 168 hours.   Radiology    No results found.  Cardiac Studies   Echo 11/19/19 1. Left ventricular ejection fraction, by estimation, is >55%. The left  ventricle has normal function. Left ventricular endocardial border not  optimally defined to evaluate regional wall motion. There is severe left  ventricular hypertrophy. Left  ventricular diastolic function could not be evaluated.  2. Right ventricular systolic function is normal. The right ventricular  size is not well visualized. Tricuspid regurgitation signal is inadequate  for assessing PA pressure.  3. The mitral valve is grossly normal. Trivial mitral valve  regurgitation.  4. The aortic valve is grossly normal. Aortic valve regurgitation not  well assessed.   Patient Profile     69 y.o. female with history of permanent  atrial fibrillation, hypertension, metastatic bladder tumor status post TURBT, indwelling Foley catheter being seen for atrial fibrillation with rapid ventricular response.  Assessment & Plan    1.  Permanent atrial fibrillation -Heart rate controlled -Stop IV Cardizem -Increase p.o. Cardizem to 60 mg every 6 hours. -If heart rate stays controlled, consolidate Cardizem to 240 mg CD daily -Eliquis 5 mg twice daily.  2.  Hypertension -Cardizem as above  3.  Bladder tumor status post indwelling Foley -Apparently not candidate for treatment.  If hospice or comfort care is pursued in the near future, anticoagulation obviously can be stopped.  Continue current cardiac management/meds for now.      Signed, Kate Sable, MD  02/21/2020, 2:38 PM

## 2020-02-21 NOTE — Progress Notes (Signed)
Patient complained about wet feeling between legs.   NT noted clear to yellow liquid in bed pain.   Concern leaking around foley.     MD placed urology order-   Urologist ask [place] to check a bladder scan  Bladder Scan:  19 ml .

## 2020-02-21 NOTE — Progress Notes (Signed)
Physical Therapy Treatment Patient Details Name: Brittney Shepard MRN: [id number] DOB: 01/19/1951 Today's Date: 02/21/2020    History of Present Illness 69 y.o. female with PMH significant for Bladder CA s/p transurethral resection of bladder tumor  with secondary malignant neoplasm of liver, stroke, CHF, a-fib not on anticoagulation, DM, HTN, chronic pain.Acquired thrombophilia  endometrial mass, morbid obesity frequent falls, COPD, indwelling catheter, mediastinal mass.  Here with weakness, dizziness. MRI revealed acute R cerebellar infarct & an acute/subacute L frontoparietal subcortical white matter infarct likely embolic & related to a-fib.    PT Comments    Pt pleasant & agreeable to tx. Nurse in room for majority of session & pt placed on room air as she was on room air yesterday. Pt did have episode of SpO2 dropping to 83% & pt c/o SOB so pt placed back on 4L then titrated down to 3L via nasal cannula & SpO2 remained > or = 90% throughout rest of session. HR ranted 67-87 bpm during session.  Pt is able to initiate supine>sit and tolerate sitting EOB for a brief period of time without any LOB noted, before requiring +2 assist to return to supine. Pt continues to c/o dizziness with minimal movement & requires significantly extra time to complete bed mobility due to this. Pt would continue to benefit from acute PT services to address balance, strength, endurance, and to increase independence with functional mobility tasks.    Follow Up Recommendations  SNF;Supervision/Assistance - 24 hour     Equipment Recommendations  None recommended by PT    Recommendations for Other Services       Precautions / Restrictions Precautions Precautions: Fall Restrictions Weight Bearing Restrictions: No    Mobility  Bed Mobility Overal bed mobility: Needs Assistance Bed Mobility: Supine to Sit;Sit to Supine     Supine to sit: Max assist;HOB elevated Sit to supine: Max assist;+2 for physical  assistance;HOB elevated   General bed mobility comments: Pt is able to initiate supine>sit, transferring BLE to EOB & beginning to push to upright trunk but ultimately requires assist to sit upright & scoot to EOB. Pt requires max assist +2 for sit>supine 2/2 weakness. Pt participates in scooting to [facility] with bed rails, bed in trendelenburg position & cuing for technique.  Transfers                    Ambulation/Gait                 Stairs             Wheelchair Mobility    Modified Rankin (Stroke Patients Only)       Balance Overall balance assessment: Needs assistance Sitting-balance support: Bilateral upper extremity supported;Feet supported Sitting balance-Leahy Scale: Fair Sitting balance - Comments: Pt sits EOB ~2 minutes with BLE/BUE support without LOB noted.                                    Cognition Arousal/Alertness: Awake/alert Behavior During Therapy: WFL for tasks assessed/performed Overall Cognitive Status: Within Functional Limits for tasks assessed                                        Exercises      General Comments        Pertinent Vitals/Pain Pain Assessment:  (  pt does report discomfort with catheter - unrated - nurse made aware) Pain Intervention(s): Monitored during session    Home Living                      Prior Function            PT Goals (current goals can now be found in the care plan section) Acute Rehab PT Goals Patient Stated Goal: none stated PT Goal Formulation: With patient Time For Goal Achievement: 02/29/20 Potential to Achieve Goals: Fair Progress towards PT goals: Progressing toward goals    Frequency    7X/week      PT Plan Current plan remains appropriate    Co-evaluation              AM-PAC PT "6 Clicks" Mobility   Outcome Measure  Help needed turning from your back to your side while in a flat bed without using bedrails?: A Lot Help  needed moving from lying on your back to sitting on the side of a flat bed without using bedrails?: A Lot Help needed moving to and from a bed to a chair (including a wheelchair)?: Total Help needed standing up from a chair using your arms (e.g., wheelchair or bedside chair)?: Total Help needed to walk in hospital room?: Total Help needed climbing 3-5 steps with a railing? : Total 6 Click Score: 8    End of Session Equipment Utilized During Treatment: Oxygen Activity Tolerance: Patient limited by fatigue Patient left: in bed;with call bell/phone within reach;with SCD's reapplied;with bed alarm set;with nursing/sitter in room Nurse Communication: Mobility status PT Visit Diagnosis: Muscle weakness (generalized) (M62.81);Difficulty in walking, not elsewhere classified (R26.2);Unsteadiness on feet (R26.81);Dizziness and giddiness (R42)     Time: 1610-9604 PT Time Calculation (min) (ACUTE ONLY): 23 min  Charges:  $Therapeutic Activity: 23-37 mins                     Lavone Nian, PT, DPT 02/21/20, 1:10 PM    Waunita Schooner 02/21/2020, 1:03 PM

## 2020-02-21 NOTE — TOC Progression Note (Signed)
Transition of Care [facility]) - Progression Note    Patient Details  Name: Barbera Perritt MRN: [id number] Date of Birth: 08/24/1950  Transition of Care [facility]) CM/SW [place], RN Phone Number: 02/21/2020, 4:21 PM  Clinical Narrative:    LVMM for Winnifred Friar @ [facility] to follow up on previous bed offer for SNF.    Expected Discharge Plan: [place] Barriers to Discharge: Continued Medical Work up  Expected Discharge Plan and Services Expected Discharge Plan: [place] In-house Referral: Clinical Social Work Discharge Planning Services: CM Consult Post Acute Care Choice: Nursing Home Living arrangements for the past 2 months: Single Family Home                                       Social Determinants of Health (SDOH) Interventions    Readmission Risk Interventions No flowsheet data found.

## 2020-02-22 DIAGNOSIS — C541 Malignant neoplasm of endometrium: Secondary | ICD-10-CM | POA: Diagnosis not present

## 2020-02-22 DIAGNOSIS — R112 Nausea with vomiting, unspecified: Secondary | ICD-10-CM | POA: Diagnosis not present

## 2020-02-22 DIAGNOSIS — I5032 Chronic diastolic (congestive) heart failure: Secondary | ICD-10-CM

## 2020-02-22 DIAGNOSIS — N179 Acute kidney failure, unspecified: Secondary | ICD-10-CM | POA: Diagnosis not present

## 2020-02-22 DIAGNOSIS — R42 Dizziness and giddiness: Secondary | ICD-10-CM | POA: Diagnosis not present

## 2020-02-22 DIAGNOSIS — R1110 Vomiting, unspecified: Secondary | ICD-10-CM

## 2020-02-22 DIAGNOSIS — Z6841 Body Mass Index (BMI) 40.0 and over, adult: Secondary | ICD-10-CM

## 2020-02-22 DIAGNOSIS — J440 Chronic obstructive pulmonary disease with acute lower respiratory infection: Secondary | ICD-10-CM

## 2020-02-22 DIAGNOSIS — I4891 Unspecified atrial fibrillation: Secondary | ICD-10-CM | POA: Diagnosis not present

## 2020-02-22 DIAGNOSIS — J209 Acute bronchitis, unspecified: Secondary | ICD-10-CM

## 2020-02-22 DIAGNOSIS — I639 Cerebral infarction, unspecified: Secondary | ICD-10-CM | POA: Diagnosis not present

## 2020-02-22 DIAGNOSIS — C679 Malignant neoplasm of bladder, unspecified: Secondary | ICD-10-CM | POA: Diagnosis not present

## 2020-02-22 DIAGNOSIS — I10 Essential (primary) hypertension: Secondary | ICD-10-CM

## 2020-02-22 DIAGNOSIS — E119 Type 2 diabetes mellitus without complications: Secondary | ICD-10-CM

## 2020-02-22 LAB — GLUCOSE, CAPILLARY
Glucose-Capillary: 143 mg/dL — ABNORMAL HIGH (ref 70–99)
Glucose-Capillary: 131 mg/dL — ABNORMAL HIGH (ref 70–99)
Glucose-Capillary: 130 mg/dL — ABNORMAL HIGH (ref 70–99)

## 2020-02-22 LAB — CBC
HCT: 35.4 % — ABNORMAL LOW (ref 36.0–46.0)
Hemoglobin: 11.2 g/dL — ABNORMAL LOW (ref 12.0–15.0)
MCH: 27.1 pg (ref 26.0–34.0)
MCHC: 31.6 g/dL (ref 30.0–36.0)
MCV: 85.5 fL (ref 80.0–100.0)
Platelets: 298 10*3/uL (ref 150–400)
RBC: 4.14 MIL/uL (ref 3.87–5.11)
RDW: 15.8 % — ABNORMAL HIGH (ref 11.5–15.5)
WBC: 8.7 10*3/uL (ref 4.0–10.5)
nRBC: 0 % (ref 0.0–0.2)

## 2020-02-22 LAB — BASIC METABOLIC PANEL
Anion gap: 9 (ref 5–15)
BUN: 15 mg/dL (ref 8–23)
CO2: 24 mmol/L (ref 22–32)
Calcium: 7.8 mg/dL — ABNORMAL LOW (ref 8.9–10.3)
Chloride: 102 mmol/L (ref 98–111)
Creatinine, Ser: 0.8 mg/dL (ref 0.44–1.00)
GFR, Estimated: >60 mL/min (ref >60)
Glucose, Bld: 130 mg/dL — ABNORMAL HIGH (ref 70–99)
Potassium: 3.8 mmol/L (ref 3.5–5.1)
Sodium: 135 mmol/L (ref 135–145)

## 2020-02-22 LAB — MAGNESIUM: Magnesium: 1.9 mg/dL (ref 1.7–2.4)

## 2020-02-22 LAB — RESPIRATORY PANEL BY RT PCR (FLU A&B, COVID)
Influenza A by PCR: NEGATIVE
Influenza B by PCR: NEGATIVE
SARS Coronavirus 2 by RT PCR: NEGATIVE

## 2020-02-22 MED ORDER — DILTIAZEM HCL 60 MG PO TABS
60.0000 mg | ORAL_TABLET | Freq: Four times a day (QID) | ORAL | Status: DC
  Filled 2020-02-22: qty 1

## 2020-02-22 MED ORDER — METOPROLOL SUCCINATE ER 25 MG PO TB24
12.5000 mg | ORAL_TABLET | Freq: Two times a day (BID) | ORAL | Status: DC
Start: 2020-02-22 — End: 2020-03-11

## 2020-02-22 MED ORDER — SALINE SPRAY 0.65 % NA SOLN: 1.0000 | NASAL | 0 refills | Status: AC | PRN

## 2020-02-22 MED ORDER — MECLIZINE HCL 25 MG PO TABS: 25.0000 mg | ORAL_TABLET | Freq: Two times a day (BID) | ORAL | 0 refills | Status: AC

## 2020-02-22 MED ORDER — ENSURE ENLIVE PO LIQD
237.0000 mL | Freq: Two times a day (BID) | ORAL | Status: DC
  Administered 2020-02-22: 237 mL via ORAL

## 2020-02-22 MED ORDER — DILTIAZEM HCL ER 60 MG PO CP12
60.0000 mg | ORAL_CAPSULE | Freq: Two times a day (BID) | ORAL | Status: DC
  Filled 2020-02-22: qty 1

## 2020-02-22 MED ORDER — ENSURE ENLIVE PO LIQD: 237.0000 mL | Freq: Two times a day (BID) | ORAL | 12 refills | Status: AC

## 2020-02-22 NOTE — Progress Notes (Signed)
Attempted to call report to [facility] Three attempts made no answer.  Unable to give handoff report at this time  Left message with front desk for nurse to call back for report .

## 2020-02-22 NOTE — TOC Transition Note (Signed)
Transition of Care [facility]) - CM/SW Discharge Note   Patient Details  Name: Drucilla Cumber MRN: [id number] Date of Birth: March 12, 1951  [facility]) CM/SW Contact:  Meriel Flavors, LCSW Phone Number: 02/22/2020, 5:39 PM   Clinical Narrative:    CSW received notification of insurance authorization, Contacted Neoma Laming with [facility], she is able to accept patient today room# 302 patient transporting via EMS. CSW notified Kieth Brightly with Hospice to make sure Hospice services have been revoked. Kieth Brightly spoke with patient via CSW'S phone and patient agreed.     Barriers to Discharge: Continued Medical Work up   Patient Goals and CMS Choice Patient states their goals for this hospitalization and ongoing recovery are:: To be in a safe place. CMS Medicare.gov Compare Post Acute Care list provided to:: Patient Choice offered to / list presented to : Patient  Discharge Placement                       Discharge Plan and Services In-house Referral: Clinical Social Work Discharge Planning Services: CM Consult Post Acute Care Choice: Nursing Home                               Social Determinants of Health (SDOH) Interventions     Readmission Risk Interventions No flowsheet data found.

## 2020-02-22 NOTE — Progress Notes (Signed)
EMS now at bedside Attempted again to call report and was able to speak with  receiving nurse Caryl Pina Time allowed for questions and concerns Verbalizes an understanding Denies any additional questions or concerns at this time

## 2020-02-22 NOTE — Progress Notes (Signed)
Occupational Therapy Treatment Patient Details Name: Brittney Shepard MRN: [id number] DOB: 1950/11/25 Today's Date: 02/22/2020    History of present illness 69 y.o. female with PMH significant for Bladder CA s/p transurethral resection of bladder tumor  with secondary malignant neoplasm of liver, stroke, CHF, a-fib not on anticoagulation, DM, HTN, chronic pain.Acquired thrombophilia  endometrial mass, morbid obesity frequent falls, COPD, indwelling catheter, mediastinal mass.  Here with weakness, dizziness. MRI revealed acute R cerebellar infarct & an acute/subacute L frontoparietal subcortical white matter infarct likely embolic & related to a-fib.   OT comments  Brittney Shepard engaged in grooming and UB dressing from bed level, able to perform with SUPV following setup. Pt needed to have BM, therapist suggested transfer to [facility], but pt stated she preferred to use bedpan, given severe fatigue. ModA for bed mobility to position and remove bedpan, MaxA for perianal hygiene. Pt exhibited some word-finding difficulty and asked therapist to place phone call for her -- Brittney Shepard seemed confused as to how to work the phone. Pt left in bed, call bell within reach, and bed alarm activated. Asked if she had any pain today, Brittney Shepard stated, "no pain, no energy." She will continue to benefit from OT while hospitalized, to address endurance, strength, and balance. Discharge plans remain appropriate.   Follow Up Recommendations  SNF    Equipment Recommendations       Recommendations for Other Services      Precautions / Restrictions Precautions Precautions: Fall Restrictions Weight Bearing Restrictions: No       Mobility Bed Mobility Overal bed mobility: Needs Assistance Bed Mobility: Rolling Rolling: Mod assist (to use bedpan)   Supine to sit: Max assist;HOB elevated     General bed mobility comments: Unable to come to EOB sit with MaxA  Transfers                      Balance      Sitting balance-Leahy Scale: Zero       Standing balance-Leahy Scale: Zero                             ADL either performed or assessed with clinical judgement   ADL Overall ADL's : Needs assistance/impaired     Grooming: Min guard;Set up   Upper Body Bathing: Min guard;Set up                   [place] and Hygiene: Bed level               Vision Patient Visual Report: No change from baseline     Perception     Praxis      Cognition Arousal/Alertness: Awake/alert Behavior During Therapy: WFL for tasks assessed/performed Overall Cognitive Status: Within Functional Limits for tasks assessed                                          Exercises Other Exercises Other Exercises: OT facilitates ed re: role of OT in acute setting, importance of OOB activity, importance of doing what self care she can for herself (as pt reports the hospice aides don't do all the work for her for bathing and expresses some frustration with this). Pt with moderate understanding, will require f/u.   Shoulder Instructions       General  Comments Pt reports being very tired today, slightly dizzy. O2 sats remain at 95% or above throughout session    Pertinent Vitals/ Pain       Faces Pain Scale: No hurt  Home Living                                          Prior Functioning/Environment              Frequency           Progress Toward Goals  OT Goals(current goals can now be found in the care plan section)     Acute Rehab OT Goals Patient Stated Goal: none stated  Plan Discharge plan remains appropriate    Co-evaluation                 AM-PAC OT "6 Clicks" Daily Activity     Outcome Measure   Help from another person eating meals?: None Help from another person taking care of personal grooming?: A Little Help from another person toileting, which includes using toliet, bedpan, or  urinal?: A Lot Help from another person bathing (including washing, rinsing, drying)?: A Little Help from another person to put on and taking off regular upper body clothing?: A Lot Help from another person to put on and taking off regular lower body clothing?: Total 6 Click Score: 15    End of Session    OT Visit Diagnosis: Unsteadiness on feet (R26.81);Muscle weakness (generalized) (M62.81)   Activity Tolerance Patient limited by fatigue;Patient tolerated treatment well   Patient Left in bed;with call bell/phone within reach   Nurse Communication          Time: 2353-6144 OT Time Calculation (min): 39 min  Charges: OT General Charges $OT Visit: 1 Visit OT Treatments $Self Care/Home Management : 38-52 mins   Josiah Lobo, PhD, [place], OTR/L ascom [phone] 02/22/20, 3:43 PM

## 2020-02-22 NOTE — Plan of Care (Signed)
  Problem: Education: Goal: Knowledge of General Education information will improve Description: Including pain rating scale, medication(s)/side effects and non-pharmacologic comfort measures Outcome: Adequate for Discharge   Problem: Activity: Goal: Risk for activity intolerance will decrease Outcome: Adequate for Discharge   Problem: Pain Managment: Goal: General experience of comfort will improve Outcome: Adequate for Discharge   Problem: Safety: Goal: Ability to remain free from injury will improve Outcome: Adequate for Discharge   Problem: Skin Integrity: Goal: Risk for impaired skin integrity will decrease Outcome: Adequate for Discharge   Problem: Education: Goal: Knowledge of disease or condition will improve Outcome: Adequate for Discharge Goal: Understanding of medication regimen will improve Outcome: Adequate for Discharge Goal: Individualized Educational Video(s) Outcome: Adequate for Discharge   Problem: Activity: Goal: Ability to tolerate increased activity will improve Outcome: Adequate for Discharge   Problem: Cardiac: Goal: Ability to achieve and maintain adequate cardiopulmonary perfusion will improve Outcome: Adequate for Discharge   Problem: Health Behavior/Discharge Planning: Goal: Ability to safely manage health-related needs after discharge will improve Outcome: Adequate for Discharge

## 2020-02-22 NOTE — Progress Notes (Signed)
Nutrition Follow Up Note   DOCUMENTATION CODES:   Morbid obesity  INTERVENTION:   Ensure Enlive po BID, each supplement provides 350 kcal and 20 grams of protein (prefers strawberry)  Magic cup TID with meals, each supplement provides 290 kcal and 9 grams of protein  NUTRITION DIAGNOSIS:   Increased nutrient needs related to cancer and cancer related treatments as evidenced by increased estimated needs.  GOAL:   Patient will meet greater than or equal to 90% of their needs -not met   MONITOR:   PO intake, Weight trends, Labs, I & O's, Supplement acceptance  ASSESSMENT:   69 year old female with history of bladder cancer s/p transurethral resection of tumor with secondary malignant neoplasm of liver, stroke, chronic dCHF, atrial fibrillation not on anticoagulation, DM, HTN, chronic pain, thrombophilia, COPD, morbid obesity, and recent PET positive for endometrial mass with peritoneal adenopathy concerning for metastatic endometrial cancer. Pt presented with nausea and vertigo and found to have acute right cerebellar and left parietal CVA.   Pt continues to have poor appetite and oral intake; pt documented to be eating anywhere from sips/bites to 70% of meals. RD will add supplements to help pt meet her estimated needs. Per chart, pt appears fairly weight stable since admit. Pt is followed by hospice care.    Medications reviewed and include: insulin  Labs reviewed: K 3.8 wnl, Mg 1.9 wnl cbgs- 143, 131 x 24 hrs  Diet Order:   Diet Order            Diet Carb Modified Fluid consistency: Thin; Room service appropriate? Yes  Diet effective now                EDUCATION NEEDS:   No education needs have been identified at this time  Skin:  Skin Assessment: Reviewed RN Assessment  Last BM:  10/20- type 7  Height:   Ht Readings from Last 1 Encounters:  02/15/20 5' (1.524 m)    Weight:   Wt Readings from Last 1 Encounters:  02/22/20 119.9 kg    BMI:  Body mass  index is 51.62 kg/m.  Estimated Nutritional Needs:   Kcal:  2200-2400  Protein:  110-120  Fluid:  1.4-1.6L/day  Koleen Distance MS, RD, LDN Please refer to [facility] for RD and/or RD on-call/weekend/after hours pager

## 2020-02-22 NOTE — Progress Notes (Signed)
Progress Note  Patient Name: Brittney Shepard Date of Encounter: 02/22/2020  Primary Cardiologist: Garen Lah, MD  Subjective   No chest pain, dyspnea, or palpitations.  She does note some positional dizziness.  Appetite is improving.  Inpatient Medications    Scheduled Meds: . apixaban  5 mg Oral BID  . Chlorhexidine Gluconate Cloth  6 each Topical Daily  . darifenacin  7.5 mg Oral Daily  . diltiazem  60 mg Oral Q6H  . influenza vaccine adjuvanted  0.5 mL Intramuscular Tomorrow-1000  . insulin aspart  0-9 Units Subcutaneous TID WC  . meclizine  25 mg Oral BID  . metoprolol succinate  12.5 mg Oral BID   Continuous Infusions: . sodium chloride 10 mL/hr at 02/18/20 0516  . diltiazem (CARDIZEM) infusion 2.5 mg/hr (02/21/20 1106)   PRN Meds: sodium chloride, acetaminophen **OR** acetaminophen, metoCLOPramide (REGLAN) injection, sodium chloride   Vital Signs    Vitals:   02/21/20 1955 02/22/20 0511 02/22/20 0700 02/22/20 0934  BP: 111/63 (!) 109/58 (!) 108/57   Pulse: 87 80 62 71  Resp: (!) 24 20 17    Temp: 98.2 F (36.8 C) 97.8 F (36.6 C) 98 F (36.7 C)   TempSrc: Oral Oral Oral   SpO2: 98% 98% 98%   Weight:  119.9 kg    Height:        Intake/Output Summary (Last 24 hours) at 02/22/2020 1101 Last data filed at 02/22/2020 0511 Gross per 24 hour  Intake 120 ml  Output 200 ml  Net -80 ml   Filed Weights   02/21/20 0307 02/21/20 0452 02/22/20 0511  Weight: 119.1 kg 119.6 kg 119.9 kg    Telemetry    Afib, 60s to 70s bpm - Personally Reviewed  ECG    No new tracings - Personally Reviewed  Physical Exam   GEN: No acute distress.   Neck: No JVD. Cardiac: Irregularly irregular, no murmurs, rubs, or gallops.  Respiratory: Clear to auscultation bilaterally.  GI: Soft, nontender, non-distended.   MS: No edema; No deformity. Neuro:  Alert and oriented x 3; Nonfocal.  Psych: Normal affect.  Labs    Chemistry Recent Labs  Lab 02/19/20 0248  02/21/20 0353 02/22/20 0628  NA 137 137 135  K 4.1 3.7 3.8  CL 106 104 102  CO2 23 27 24   GLUCOSE 92 104* 130*  BUN 11 11 15   CREATININE 0.91 0.84 0.80  CALCIUM 8.0* 8.0* 7.8*  GFRNONAA >60 >60 >60  ANIONGAP 8 6 9      Hematology Recent Labs  Lab 02/18/20 0438 02/19/20 0248 02/22/20 0628  WBC 10.8* 10.6* 8.7  RBC 4.20 3.94 4.14  HGB 11.3* 10.6* 11.2*  HCT 36.3 34.6* 35.4*  MCV 86.4 87.8 85.5  MCH 26.9 26.9 27.1  MCHC 31.1 30.6 31.6  RDW 16.0* 15.9* 15.8*  PLT 341 294 298    Cardiac EnzymesNo results for input(s): TROPONINI in the last 168 hours. No results for input(s): TROPIPOC in the last 168 hours.   BNP Recent Labs  Lab 02/18/20 0438  BNP 380.9*     DDimer No results for input(s): DDIMER in the last 168 hours.   Radiology    No results found.  Cardiac Studies   2D echo 11/2019: 1. Left ventricular ejection fraction, by estimation, is >55%. The left  ventricle has normal function. Left ventricular endocardial border not  optimally defined to evaluate regional wall motion. There is severe left  ventricular hypertrophy. Left  ventricular diastolic function could  not be evaluated.  2. Right ventricular systolic function is normal. The right ventricular  size is not well visualized. Tricuspid regurgitation signal is inadequate  for assessing PA pressure.  3. The mitral valve is grossly normal. Trivial mitral valve  regurgitation.  4. The aortic valve is grossly normal. Aortic valve regurgitation not  well assessed.   Patient Profile     69 y.o. female with history of permanent Afib, CVA, metastatic bladder tumor status post TURBT with indwelling Foley catheter, and HTN who we are seeing for evaluation of A. fib with RVR.  Assessment & Plan    1.  Permanent A. fib: -Ventricular rates are well controlled in the 60s to 70s bpm -Consolidate short acting diltiazem back to PTA Cardizem SR 60 mg twice daily -Toprol-XL 12.5 mg -CHA2DS2-VASc at least  (HTN, age x 1, gender) -Continue Eliquis 5 mg twice daily -Of note, patient was not taking any medications at home as she indicates she lost her prescriptions  2.  HTN: -BP on the soft side -Hold afternoon dose of diltiazem with recommendation to start long-acting Cardizem this evening as outlined above along with Toprol-XL  3.  Anemia: -Hemoglobin stable  4.  CVA: -MRI brain this admission demonstrated chronic left MCA territory infarct along with a 13 mm acute to early subacute infarct within the left frontoparietal subcortical white matter -Underlying strokes may be contributing to her dizziness -Recommend continuing Eliquis as outlined above as long as hemoglobin and patient's functional status allow to minimize stroke risk  For questions or updates, please contact [facility] Please consult [email or web address] for contact info under Cardiology/STEMI.    Signed, Christell Faith, PA-C [place] Pager: [phone] 02/22/2020, 11:01 AM

## 2020-02-22 NOTE — Progress Notes (Addendum)
[place] ([place] patient RN note:  Patient is a current hospice patient with a terminal diagnosis of bladder cancer with metastases to liver. She presented to the ED 10.13 for weakness and dizziness. Hospice was not notified prior to transport. Patient is a DNR and this is a related admission.  Visited with patient in room. She was resting in bed and denies any issues except occasional dizziness. Yesterday evening, patient's O2 sats dropped to 79% and she was placed on O2 at 3L. Cardizem drip has been discontinued. PT consult today and patient requires significant assistance with mobility and is limited by dizziness with movement. PT recommendation is still for SNF. Patient spoke with son on phone while RN was in room.  VS: BP 129/91, HR 75, Resp 19, Temp 98.4, O2 sat is 98% on 3 L [place]  I&O: 178ml/200ml  Abnormal Labs: Glucose: 130 (H) Calcium: 7.8 (L) Hemoglobin: 11.2 (L) HCT: 35.4 (L) RDW: 15.8 (H)  Diagnostics: none today  IV/PRN Meds: none given  Problem List: Active Problems:   Atrial fibrillation with RVR (HCC)   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HCC)   Essential hypertension   Type 2 diabetes mellitus without complication, without long-term current use of insulin (HCC)   Morbid obesity with BMI of 45.0-49.9, adult (HCC)   Chronic diastolic CHF (congestive heart failure) (HCC)   Endometrial cancer (HCC)   COPD with acute bronchitis (HCC)   Vertigo   Dehydration   AKI (acute kidney injury) ([place])   Cerebellar stroke ([place])   Metastatic carcinoma ([place])  69 y.o.femalewith medical history significant of Bladder cancer s/p transurethral resection of bladder tumorwith secondary malignant neoplasm of liver, stroke, congestive heart failure, atrial fibrillationnot on anticoagultion, diabetes, hypertension, chronic pain.Acquired thrombophilia endometrial mass, morbid obesity frequent falls, COPD, indwelling catheter,  mediastinal mass admitted for AKI, dehydration, dizziness.Went into afib rvr.  Dizziness/Vertigo- Has some vertigo sx , also with stroke found on Mri, and afib rvr, likely multifactorial Continue with meclizine  Need to better control the heart rate as it causes lightheadedness when she is tachycardic   Acute /early subacute infarct-  On MRI found with acute/early subacute infarct within the left frontoparietal subcortical white matter and an acute infarct within the right cerebellar hemisphere, please see full MRI result  -Neurology following -stroke likely due to her A. fib, anticoagulation was started  Carotids no significant stenosis  10/19-Clinically improving PT OT recommends SNF Started on anticoagulation Previously she was not on anticoagulation in the past due to hematuria prior to her surgery.  But she has been placed on it now FLP goal <70  PT OT recommend SNF  Previously not on anticoagulation due to hematuria prior to her surgery  Target fasting lipid less than 70  .Urothelial carcinoma of bladder(HCC) widely metastatic - -oncology following-no plans for systemic treatment and should continue hospice services  Palliative was consulted, needs rehab with palliative care and once goes back home needs hospice care -Please see palliative care note  .Essential hypertension- Stable  Continue Cardizem and will add low-dose Toprol-XL as tolerated which will help with her heart rate to   .Endometrial cancer [facility])- Oncology as above  .acute on Chronic diastolic CHF(congestive heart failure) ([place]) - Was mildly volume overloaded, improving.  Need to give diuretics as needed Likely A. fib RVR exacerbated this Monitor weight, I's and O's  .Atrial fibrillation with RVR (HCC)-chronic 10/17rate not controlled overnight, and IV dig x2 still rate not controlled 10/18-heart rate  still variable better controlled than yesterday.   10/19-still HR not well controlled at  rest.  Having a difficult time weaning her down from Cardizem drip. We will continue Cardizem p.o. and drip until we are able to wean her down We will add Toprol-XL 12.5 mg twice daily increase as tolerated Continue anticoagulation --continue oral dilt 60 mg q6h  .COPDwith hx/o bronchitis ([place]) -currently without exacerbation Avoid neb treatment currently due to A. fib RVR   .Dehydration-was rehydrated Resolved [place] p.o. intake  .AKI (acute kidney injury) ([place])-- likely secondary to dehydration Resolved with IV fluid for hydration Creatinine today is 0.91 Continue to periodically monitor  UTi - pt with indwelling catheter, unsure if true infection vs colonization Urine culture with mixed species needs recollection Completed abx with Rocephin  Discharge Planning: Ongoing -plan is for SNF  Family Contact: Patient was speaking with son on phone while RN was in room.  IDG: Updated  Goals of care: Clear  Please call with any hospice related question or concerns.   Zandra Abts, RN [place] [facility] Liaison [phone]

## 2020-02-22 NOTE — Progress Notes (Signed)
Physical Therapy Treatment Patient Details Name: Brittney Shepard MRN: [id number] DOB: 02-Dec-1950 Today's Date: 02/22/2020    History of Present Illness 69 y.o. female with PMH significant for Bladder CA s/p transurethral resection of bladder tumor  with secondary malignant neoplasm of liver, stroke, CHF, a-fib not on anticoagulation, DM, HTN, chronic pain.Acquired thrombophilia  endometrial mass, morbid obesity frequent falls, COPD, indwelling catheter, mediastinal mass.  Here with weakness, dizziness. MRI revealed acute R cerebellar infarct & an acute/subacute L frontoparietal subcortical white matter infarct likely embolic & related to a-fib.    PT Comments    Pt agreeable to tx but continues to be limited by dizziness with movement when transitioning to EOB. Pt continues to require significant assistance for bed mobility as noted below. Pt performs BLE ankle pumps, SAQs, and hip adduction pillow squeezes with max instructional cuing for technique. PT instructs pt on use of incentive spirometer with pt demonstrating poor understanding/learning as she requires MAX cuing to inhale with device vs exhale. PT encouraged pt to use device hourly. Pt would benefit from acute PT services to progress mobility as able & to focus on strengthening, endurance, & balance.   Pt on 3L/min supplemental oxygen via nasal cannula & SpO2 >90% throughout. HR ranged 67 bpm at rest at beginning of session to max 87 bpm with activity.    Follow Up Recommendations  SNF;Supervision/Assistance - 24 hour     Equipment Recommendations  None recommended by PT    Recommendations for Other Services       Precautions / Restrictions Precautions Precautions: Fall Restrictions Weight Bearing Restrictions: No    Mobility  Bed Mobility Overal bed mobility: Needs Assistance Bed Mobility: Supine to Sit;Sit to Supine     Supine to sit: Max assist;HOB elevated Sit to supine: Max assist;+2 for physical assistance;HOB  elevated   General bed mobility comments: pt attempts to transfer supine>sit with max cuing for technique & to scoot BLE to EOB but is only able to transition half way before requiring rest break & ultimately returning supine 2/2 dizziness & slight nausea. Pt is able to scoot to [facility] with PT positioning BLE on bed to assist with pushing, cuing for hand placement on bed rails, max assist & bed in trendelenburg position.  Transfers                    Ambulation/Gait                 Stairs             Wheelchair Mobility    Modified Rankin (Stroke Patients Only)       Balance                                            Cognition Arousal/Alertness: Awake/alert Behavior During Therapy: WFL for tasks assessed/performed Overall Cognitive Status: Within Functional Limits for tasks assessed                                        Exercises      General Comments General comments (skin integrity, edema, etc.): Pt continues to be limited by fatigue & dizziness with movement. Pt willing to participate but encouraged to progress mobility as able.      Pertinent Vitals/Pain  Pain Assessment: No/denies pain    Home Living                      Prior Function            PT Goals (current goals can now be found in the care plan section) Acute Rehab PT Goals Patient Stated Goal: none stated PT Goal Formulation: With patient Time For Goal Achievement: 02/29/20 Potential to Achieve Goals: Fair Progress towards PT goals: Progressing toward goals    Frequency    7X/week      PT Plan Current plan remains appropriate    Co-evaluation              AM-PAC PT "6 Clicks" Mobility   Outcome Measure  Help needed turning from your back to your side while in a flat bed without using bedrails?: A Lot Help needed moving from lying on your back to sitting on the side of a flat bed without using bedrails?: A Lot Help  needed moving to and from a bed to a chair (including a wheelchair)?: Total Help needed standing up from a chair using your arms (e.g., wheelchair or bedside chair)?: Total Help needed to walk in hospital room?: Total Help needed climbing 3-5 steps with a railing? : Total 6 Click Score: 8    End of Session Equipment Utilized During Treatment: Oxygen Activity Tolerance: Patient limited by fatigue (limited by dizziness with movement) Patient left: in bed;with call bell/phone within reach;with SCD's reapplied;with bed alarm set   PT Visit Diagnosis: Muscle weakness (generalized) (M62.81);Difficulty in walking, not elsewhere classified (R26.2);Unsteadiness on feet (R26.81);Dizziness and giddiness (R42)     Time: 8527-7824 PT Time Calculation (min) (ACUTE ONLY): 25 min  Charges:  $Therapeutic Activity: 23-37 mins                     Lavone Nian, PT, DPT 02/22/20, 10:40 AM    Waunita Schooner 02/22/2020, 10:38 AM

## 2020-02-22 NOTE — Discharge Summary (Signed)
Physician Discharge Summary  Brittney Shepard NLZ:[id number] DOB: Apr 26, 1951 DOA: 02/14/2020  PCP: Marguerita Merles, MD  Admit date: 02/14/2020 Discharge date: 02/22/2020  Admitted From: Home Disposition: SNF   Recommendations for Outpatient Follow-up:  1. Follow up with PCP in 1-2 weeks 2. Please obtain BMP/CBC in one week 3. Please follow up on the following pending results:None  Home Health:No Equipment/Devices: None Discharge Condition: Fair CODE STATUS: DNR Diet recommendation: Heart Healthy / Carb Modified   Brief/Interim Summary: Brittney Shepard a 69 y.o.femalewith medical history significant of Bladder cancer s/p transurethral resection of bladder tumorwith secondary malignant neoplasm of liver, stroke, congestive heart failure, atrial fibrillationnot on anticoagultion, diabetes, hypertension, chronic pain.Acquired thrombophilia endometrial mass, morbid obesity frequent falls, COPD, indwelling catheter, mediastinal mass Presented withfeeling dizziness with movement for past 1 day and had an episode of vomiting. Describes vertigo-like sensation which is worse when she is trying to move. Symptoms were concerning for vertigo, found to have a new infarct within the right cerebellar hemisphere most likely responsible for the symptoms.  Symptoms improved with meclizine which can be used as needed. Patient was not taking her Eliquis prior to this admission due to hematuria and it was resumed on discharge and during current hospitalization.  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  Palliative care was consulted and she agrees to go back to hospice care.  PT recommended SNF placement and she was discharged to SNF with outpatient palliative/hospice care.  Patient has an history of permanent atrial fibrillation, underwent RVR and treated with Cardizem infusion.  Discharged on home dose of Cardizem.  Home dose of atenolol was changed with  low-dose Toprol-XL.  She will follow-up with her cardiologist for further recommendations. Patient has an history of chronic diastolic heart failure.  Appears euvolemic. He was not taking home dose of Lasix so it was discontinued.  She will follow-up with her cardiologist and can resume Lasix as needed.  Patient has an history of COPD.  Chronic bronchitis.  Currently without any acute exacerbation.  We will continue home meds.  Patient also has AKI on admission most likely secondary to dehydration as creatinine improved with gentle IV hydration.  On presentation UA looks infected, urine cultures with mixed species.  Patient has a chronic indwelling catheter.  Not sure whether it was a true infection or colonization.  She completed the course of Rocephin while in the hospital.  Patient will continue with rest of her home meds and follow-up with her providers.  Discharge Diagnoses:  Active Problems:   Atrial fibrillation with RVR (HCC)   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HCC)   Essential hypertension   Type 2 diabetes mellitus without complication, without long-term current use of insulin (HCC)   Morbid obesity with BMI of 45.0-49.9, adult (HCC)   Chronic diastolic CHF (congestive heart failure) (HCC)   Endometrial cancer (HCC)   COPD with acute bronchitis (HCC)   Dizziness   Dehydration   AKI (acute kidney injury) ([place])   Cerebellar stroke ([place])   Metastatic carcinoma (HCC)   Non-intractable vomiting   Discharge Instructions  Discharge Instructions    Diet - low sodium heart healthy   Complete by: As directed    Discharge wound care:   Complete by: As directed    As directed above   Increase activity slowly   Complete by: As directed      Allergies as of 02/22/2020      Reactions   Penicillin G  Hives   Sulfa Antibiotics Rash      Medication List    STOP taking these medications   atenolol 25 MG tablet Commonly known as: TENORMIN   furosemide 40 MG tablet Commonly  known as: Lasix   potassium chloride 10 MEQ tablet Commonly known as: KLOR-CON     TAKE these medications   acetaminophen 325 MG tablet Commonly known as: TYLENOL Take 2 tablets (650 mg total) by mouth every 6 (six) hours as needed for mild pain (or Fever >/= 101).   apixaban 2.5 MG Tabs tablet Commonly known as: ELIQUIS Take 1 tablet (2.5 mg total) by mouth 2 (two) times daily.   diltiazem 60 MG 12 hr capsule Commonly known as: CARDIZEM SR Take 1 capsule (60 mg total) by mouth every 12 (twelve) hours. What changed: when to take this   feeding supplement Liqd Take 237 mLs by mouth 2 (two) times daily between meals. Start taking on: February 23, 2020   ipratropium-albuterol 0.5-2.5 (3) MG/3ML Soln Commonly known as: DUONEB Take 3 mLs by nebulization 3 (three) times daily.   meclizine 25 MG tablet Commonly known as: ANTIVERT Take 1 tablet (25 mg total) by mouth 2 (two) times daily.   metFORMIN 500 MG tablet Commonly known as: GLUCOPHAGE Take 500 mg by mouth daily with breakfast.   metoprolol succinate 25 MG 24 hr tablet Commonly known as: TOPROL-XL Take 0.5 tablets (12.5 mg total) by mouth 2 (two) times daily.   multivitamin tablet Take 1 tablet by mouth daily.   polyethylene glycol 17 g packet Commonly known as: MIRALAX / GLYCOLAX Take 17 g by mouth daily as needed for mild constipation.   senna-docusate 8.6-50 MG tablet Commonly known as: Senokot-S Take 1 tablet by mouth 2 (two) times daily.   sodium chloride 0.65 % Soln nasal spray Commonly known as: OCEAN Place 1 spray into both nostrils as needed for congestion.   VESIcare 10 MG tablet Generic drug: solifenacin Take 10 mg by mouth daily.            Discharge Care Instructions  (From admission, onward)         Start     Ordered   02/22/20 0000  Discharge wound care:       Comments: As directed above   02/22/20 1603          Contact information for follow-up providers    Marguerita Merles,  MD. Schedule an appointment as soon as possible for a visit.   Specialty: Family Medicine Contact information: [place] [address] [phone]        Kate Sable, MD .   Specialties: Cardiology, Radiology Contact information: [place] [address] [phone]            Contact information for after-discharge care    Destination    [facility] [place] [facility] .   Service: Skilled Nursing Contact information: [address] [place] [phone]                 Allergies  Allergen Reactions  . Penicillin G Hives  . Sulfa Antibiotics Rash    Consultations:  Cardiology  Oncology  Urology  Procedures/Studies: CT Head Wo Contrast  Result Date: 02/14/2020 CLINICAL DATA:  Vertigo EXAM: CT HEAD WITHOUT CONTRAST TECHNIQUE: Contiguous axial images were obtained from the base of the skull through the vertex without intravenous contrast. COMPARISON:  03/31/2009 FINDINGS: Brain: Ends level malacia secondary  to remote left frontal infarct is present. Remote lacunar infarct noted within the left basal ganglia. Mild parenchymal volume loss is present, commensurate with the patient's age. Mild periventricular white matter changes are present likely reflecting the sequela of small vessel ischemia. No evidence of acute intracranial hemorrhage or infarct. No abnormal mass effect or midline shift. No abnormal intra or extra-axial mass lesion or fluid collection. The ventricular size is normal. Cerebellum is unremarkable. Vascular: No asymmetric hyperdense vasculature at the skull base. Skull: Intact Sinuses/Orbits: The visualized paranasal sinuses are clear. The visualized orbits are unremarkable. Other: Visualized mastoid air cells and middle ear cavities are clear. IMPRESSION: Remote infarcts. Senescent changes. No evidence of acute intracranial hemorrhage or infarct. Electronically Signed    By: Fidela Salisbury MD   On: 02/14/2020 22:11   MR BRAIN W WO CONTRAST  Addendum Date: 02/15/2020   ADDENDUM REPORT: 02/15/2020 17:00 ADDENDUM: These results were called by telephone at the time of interpretation on 02/15/2020 at 5:00 pm to provider Dr. Nolberto Hanlon, who verbally acknowledged these results. Electronically Signed   By: Kellie Simmering DO   On: 02/15/2020 17:00   Result Date: 02/15/2020 CLINICAL DATA:  Metastatic disease evaluation. EXAM: MRI HEAD WITHOUT AND WITH CONTRAST TECHNIQUE: Multiplanar, multiecho pulse sequences of the brain and surrounding structures were obtained without and with intravenous contrast. CONTRAST:  7.50mL GADAVIST GADOBUTROL 1 MMOL/ML IV SOLN COMPARISON:  Noncontrast head CT 02/14/2020. FINDINGS: Brain: Mild intermittent motion degradation. This includes mild motion degradation of the axial and coronal postcontrast T1 weighted sequences. Mild generalized cerebral atrophy. No abnormal enhancement is demonstrated to suggest intracranial metastatic disease. There is patchy restricted diffusion within the inferomedial right cerebellar hemisphere and right cerebellar tonsil (PICA vascular territory) consistent with acute infarction. This encompasses a region measuring 4.6 x 1.9 cm in transaxial dimensions (series 5, image 11). Corresponding T2/FLAIR hyperintensity at this site. Additional 13 mm focus of diffusion weighted hyperintensity and corresponding intermediate signal on the ADC map consistent with acute/early subacute infarction (series 5, image 34) (series 7, image 15). Redemonstrated chronic left MCA territory cortical/subcortical infarct within the frontal lobe, left insula/subinsular white matter and deep left frontal white matter. Mild chronic blood products are present at this site. Chronic blood products are also present within the left midbrain likely reflecting sequela of prior hemorrhage. Moderate multifocal T2/FLAIR hyperintensity within the cerebral white  matter is nonspecific, but consistent with chronic small vessel ischemic disease. No extra-axial fluid collection. No midline shift. Incidentally noted pineal gland cyst without suspicious masslike or nodular enhancement. Vascular: Expected proximal arterial flow voids. Skull and upper cervical spine: No focal suspicious marrow lesion. Sinuses/Orbits: Visualized orbits show no acute finding. Mild ethmoid sinus mucosal thickening. Small right maxillary sinus mucous retention cyst. Trace fluid within bilateral mastoid air cells. IMPRESSION: Mildly motion degraded examination. No evidence of intracranial metastatic disease. Patchy acute infarcts within the right cerebellar hemisphere (PICA vascular territory) encompassing 4.6 x 1.9 cm in transaxial dimensions. 13 mm acute/early subacute infarct within the left frontoparietal subcortical white matter. Redemonstrated chronic left MCA territory cortical/subcortical infarct within the left frontal lobe, left insula/subinsular white matter and deep left frontal white matter. Background mild generalized cerebral atrophy and moderate cerebral white matter chronic small vessel ischemic disease. Remote blood products within the left midbrain indicative of prior hemorrhage at this site. Electronically Signed: By: Kellie Simmering DO On: 02/15/2020 16:34   US RENAL  Result Date: 02/16/2020 CLINICAL DATA:  Acute renal injury EXAM: RENAL / URINARY  TRACT ULTRASOUND COMPLETE COMPARISON:  12/12/2019 FINDINGS: Right Kidney: Renal measurements: 9.7 x 5.0 x 5.5 cm. = volume: 138 mL. 1.3 cm cyst is noted in the mid to upper pole of the right kidney. It is simple in nature. 6 mm stone is noted stable from prior PET-CT. Mild cortical thinning is noted. Left Kidney: Renal measurements: 11.3 x 6.3 x 5.5 cm = volume: 207 mL. Echogenicity within normal limits. No mass or hydronephrosis visualized. Bladder: Decompressed by Foley catheter. Other: None. IMPRESSION: Right renal cyst.  Nonobstructing right renal stone. Cortical thinning in the right kidney. No other focal abnormality is noted. Electronically Signed   By: Inez Catalina M.D.   On: 02/16/2020 20:03   US Carotid Bilateral  Result Date: 02/17/2020 CLINICAL DATA:  69 year old female with history of stroke-like symptoms. EXAM: BILATERAL CAROTID DUPLEX ULTRASOUND TECHNIQUE: Pearline Cables scale imaging, color Doppler and duplex ultrasound were performed of bilateral carotid and vertebral arteries in the neck. COMPARISON:  None. FINDINGS: Criteria: Quantification of carotid stenosis is based on velocity parameters that correlate the residual internal carotid diameter with NASCET-based stenosis levels, using the diameter of the distal internal carotid lumen as the denominator for stenosis measurement. The following velocity measurements were obtained: RIGHT ICA: Peak systolic velocity 43 cm/sec, End diastolic velocity 9 cm/sec CCA: Peak systolic velocity 72 cm/sec SYSTOLIC ICA/CCA RATIO:  0.6 ECA: Peak systolic velocity 76 cm/sec LEFT ICA: Peak systolic velocity 49 cm/sec, End diastolic velocity 8 cm/sec CCA: 83 cm/sec SYSTOLIC ICA/CCA RATIO:  0.6 ECA: 62 cm/sec RIGHT CAROTID ARTERY: No atherosclerotic plaque formation. No significant tortuosity. Normal low resistance waveforms. RIGHT VERTEBRAL ARTERY:  Antegrade flow. LEFT CAROTID ARTERY: No atherosclerotic plaque formation. No significant tortuosity. Normal low resistance waveforms. LEFT VERTEBRAL ARTERY:  Antegrade flow. Upper extremity non-invasive blood pressures: Not obtained. IMPRESSION: 1. Right carotid artery system: Patent without significant atherosclerotic plaque formation. 2. Left carotid artery system: Patent without significant atherosclerotic plaque formation. 3.  Vertebral artery system: Patent with antegrade flow bilaterally. Ruthann Cancer, MD [facility] Electronically Signed   By: Ruthann Cancer MD   On: 02/17/2020 10:42      Subjective: Patient was seen and examined today.  No new complaint.  She was eating her lunch.  Discharge Exam: Vitals:   02/22/20 0934 02/22/20 1100  BP:  (!) 129/91  Pulse: 71 75  Resp:  19  Temp:  98.4 F (36.9 C)  SpO2:  98%   Vitals:   02/22/20 0511 02/22/20 0700 02/22/20 0934 02/22/20 1100  BP: (!) 109/58 (!) 108/57  (!) 129/91  Pulse: 80 62 71 75  Resp: 20 17  19   Temp: 97.8 F (36.6 C) 98 F (36.7 C)  98.4 F (36.9 C)  TempSrc: Oral Oral  Oral  SpO2: 98% 98%  98%  Weight: 119.9 kg     Height:        General: Pt is alert, awake, not in acute distress Cardiovascular: RRR, S1/S2 +, no rubs, no gallops Respiratory: CTA bilaterally, no wheezing, no rhonchi Abdominal: Soft, NT, ND, bowel sounds + Extremities: no edema, no cyanosis   The results of significant diagnostics from this hospitalization (including imaging, microbiology, ancillary and laboratory) are listed below for reference.    Microbiology: Recent Results (from the past 240 hour(s))  Urine culture     Status: Abnormal   Collection Time: 02/14/20  7:49 PM   Specimen: Urine, Random  Result Value Ref Range Status   Specimen Description  Final    URINE, RANDOM Performed at [facility], [address] [place], [place] [address]    Special Requests   Final    NONE Performed at [facility], [address] [place], [place] [address]    Culture MULTIPLE SPECIES PRESENT, SUGGEST RECOLLECTION (A)  Final   Report Status 02/16/2020 FINAL  Final  Respiratory Panel by RT PCR (Flu A&B, Covid) - Nasopharyngeal Swab     Status: None   Collection Time: 02/14/20  9:45 PM   Specimen: Nasopharyngeal Swab  Result Value Ref Range Status   SARS Coronavirus 2 by RT PCR NEGATIVE NEGATIVE Final    Comment: (NOTE) SARS-CoV-2 target nucleic acids are NOT DETECTED.  The SARS-CoV-2 RNA is generally detectable in upper respiratoy specimens during the acute phase of infection. The  lowest concentration of SARS-CoV-2 viral copies this assay can detect is 131 copies/mL. A negative result does not preclude SARS-Cov-2 infection and should not be used as the sole basis for treatment or other patient management decisions. A negative result may occur with  improper specimen collection/handling, submission of specimen other than nasopharyngeal swab, presence of viral mutation(s) within the areas targeted by this assay, and inadequate number of viral copies (<131 copies/mL). A negative result must be combined with clinical observations, patient history, and epidemiological information. The expected result is Negative.  Fact Sheet for Patients:  PinkCheek.be  Fact Sheet for Healthcare Providers:  GravelBags.it  This test is no t yet approved or cleared by the [place] FDA and  has been authorized for detection and/or diagnosis of SARS-CoV-2 by FDA under an Emergency Use Authorization (EUA). This EUA will remain  in effect (meaning this test can be used) for the duration of the COVID-19 declaration under Section 564(b)(1) of the Act, 21 U.S.C. section 360bbb-3(b)(1), unless the authorization is terminated or revoked sooner.     Influenza A by PCR NEGATIVE NEGATIVE Final   Influenza B by PCR NEGATIVE NEGATIVE Final    Comment: (NOTE) The Xpert Xpress SARS-CoV-2/FLU/RSV assay is intended as an aid in  the diagnosis of influenza from Nasopharyngeal swab specimens and  should not be used as a sole basis for treatment. Nasal washings and  aspirates are unacceptable for Xpert Xpress SARS-CoV-2/FLU/RSV  testing.  Fact Sheet for Patients: PinkCheek.be  Fact Sheet for Healthcare Providers: GravelBags.it  This test is not yet approved or cleared by the [place] FDA and  has been authorized for detection and/or diagnosis of SARS-CoV-2 by  FDA under  an Emergency Use Authorization (EUA). This EUA will remain  in effect (meaning this test can be used) for the duration of the  Covid-19 declaration under Section 564(b)(1) of the Act, 21  U.S.C. section 360bbb-3(b)(1), unless the authorization is  terminated or revoked. Performed at [facility], [address] [place], [place] [address]      Labs: BNP (last 3 results) Recent Labs    11/18/19 0919 02/18/20 0438  BNP 246.5* 924.2*   Basic Metabolic Panel: Recent Labs  Lab 02/16/20 0458 02/19/20 0248 02/21/20 0353 02/22/20 0628  NA 141 137 137 135  K 4.1 4.1 3.7 3.8  CL 108 106 104 102  CO2 26 23 27 24   GLUCOSE 101* 92 104* 130*  BUN 16 11 11 15   CREATININE 1.10* 0.91 0.84 0.80  CALCIUM 8.0* 8.0* 8.0* 7.8*  MG  --   --   --  1.9   Liver Function Tests: No results for input(s): AST,  ALT, ALKPHOS, BILITOT, PROT, ALBUMIN in the last 168 hours. No results for input(s): LIPASE, AMYLASE in the last 168 hours. No results for input(s): AMMONIA in the last 168 hours. CBC: Recent Labs  Lab 02/18/20 0438 02/19/20 0248 02/22/20 0628  WBC 10.8* 10.6* 8.7  HGB 11.3* 10.6* 11.2*  HCT 36.3 34.6* 35.4*  MCV 86.4 87.8 85.5  PLT 341 294 298   Cardiac Enzymes: No results for input(s): CKTOTAL, CKMB, CKMBINDEX, TROPONINI in the last 168 hours. BNP: Invalid input(s): POCBNP CBG: Recent Labs  Lab 02/21/20 1126 02/21/20 1653 02/21/20 2149 02/22/20 0914 02/22/20 1128  GLUCAP 111* 116* 131* 143* 131*   D-Dimer No results for input(s): DDIMER in the last 72 hours. Hgb A1c No results for input(s): HGBA1C in the last 72 hours. Lipid Profile No results for input(s): CHOL, HDL, LDLCALC, TRIG, CHOLHDL, LDLDIRECT in the last 72 hours. Thyroid function studies No results for input(s): TSH, T4TOTAL, T3FREE, THYROIDAB in the last 72 hours.  Invalid input(s): FREET3 Anemia work up No results for input(s): VITAMINB12, FOLATE, FERRITIN, TIBC, IRON, RETICCTPCT in the last  72 hours. Urinalysis    Component Value Date/Time   COLORURINE AMBER (A) 02/14/2020 1949   APPEARANCEUR CLOUDY (A) 02/14/2020 1949   LABSPEC 1.012 02/14/2020 1949   PHURINE 7.0 02/14/2020 1949   GLUCOSEU NEGATIVE 02/14/2020 1949   HGBUR MODERATE (A) 02/14/2020 1949   BILIRUBINUR NEGATIVE 02/14/2020 [place] NEGATIVE 02/14/2020 1949   PROTEINUR 100 (A) 02/14/2020 1949   NITRITE POSITIVE (A) 02/14/2020 1949   LEUKOCYTESUR LARGE (A) 02/14/2020 1949   Sepsis Labs Invalid input(s): PROCALCITONIN,  WBC,  LACTICIDVEN Microbiology Recent Results (from the past 240 hour(s))  Urine culture     Status: Abnormal   Collection Time: 02/14/20  7:49 PM   Specimen: Urine, Random  Result Value Ref Range Status   Specimen Description   Final    URINE, RANDOM Performed at [facility], [address] [place], [place] [address]    Special Requests   Final    NONE Performed at [facility], [address] [place], [place] [address]    Culture MULTIPLE SPECIES PRESENT, SUGGEST RECOLLECTION (A)  Final   Report Status 02/16/2020 FINAL  Final  Respiratory Panel by RT PCR (Flu A&B, Covid) - Nasopharyngeal Swab     Status: None   Collection Time: 02/14/20  9:45 PM   Specimen: Nasopharyngeal Swab  Result Value Ref Range Status   SARS Coronavirus 2 by RT PCR NEGATIVE NEGATIVE Final    Comment: (NOTE) SARS-CoV-2 target nucleic acids are NOT DETECTED.  The SARS-CoV-2 RNA is generally detectable in upper respiratoy specimens during the acute phase of infection. The lowest concentration of SARS-CoV-2 viral copies this assay can detect is 131 copies/mL. A negative result does not preclude SARS-Cov-2 infection and should not be used as the sole basis for treatment or other patient management decisions. A negative result may occur with  improper specimen collection/handling, submission of specimen other than nasopharyngeal swab, presence of viral mutation(s) within  the areas targeted by this assay, and inadequate number of viral copies (<131 copies/mL). A negative result must be combined with clinical observations, patient history, and epidemiological information. The expected result is Negative.  Fact Sheet for Patients:  PinkCheek.be  Fact Sheet for Healthcare Providers:  GravelBags.it  This test is no t yet approved or cleared by the [place] FDA and  has been authorized for detection and/or diagnosis of SARS-CoV-2 by FDA under  an Emergency Use Authorization (EUA). This EUA will remain  in effect (meaning this test can be used) for the duration of the COVID-19 declaration under Section 564(b)(1) of the Act, 21 U.S.C. section 360bbb-3(b)(1), unless the authorization is terminated or revoked sooner.     Influenza A by PCR NEGATIVE NEGATIVE Final   Influenza B by PCR NEGATIVE NEGATIVE Final    Comment: (NOTE) The Xpert Xpress SARS-CoV-2/FLU/RSV assay is intended as an aid in  the diagnosis of influenza from Nasopharyngeal swab specimens and  should not be used as a sole basis for treatment. Nasal washings and  aspirates are unacceptable for Xpert Xpress SARS-CoV-2/FLU/RSV  testing.  Fact Sheet for Patients: PinkCheek.be  Fact Sheet for Healthcare Providers: GravelBags.it  This test is not yet approved or cleared by the [place] FDA and  has been authorized for detection and/or diagnosis of SARS-CoV-2 by  FDA under an Emergency Use Authorization (EUA). This EUA will remain  in effect (meaning this test can be used) for the duration of the  Covid-19 declaration under Section 564(b)(1) of the Act, 21  U.S.C. section 360bbb-3(b)(1), unless the authorization is  terminated or revoked. Performed at [facility], [address] [place], [place] [address]     Time coordinating discharge: Over 30  minutes  SIGNED:  Lorella Nimrod, MD  [facility] 02/22/2020, 4:06 PM  If 7PM-7AM, please contact night-coverage [email or web address]  This record has been created using Systems analyst. Errors have been sought and corrected,but may not always be located. Such creation errors do not reflect on the standard of care.

## 2020-02-22 NOTE — TOC Progression Note (Addendum)
Transition of Care [facility]) - Progression Note    Patient Details  Name: Samuella Rasool MRN: [id number] Date of Birth: Jul 27, 1950  Transition of Care [facility]) CM/SW Contact  Beverly Sessions, RN Phone Number: 02/22/2020, 1:14 PM  Clinical Narrative:    VM left for Debra at [facility]. Also called front desk, they confirmed she is working today, and I left a message with the front desk as well.   Patient agreeable for SNF, and is agreeable to rescind her hospice services.   Patient states that she does not have a preference of facility, would prefer to stay in [facility] if able.    Extended bed search  Insurance auth submitted through the portal with update notes now that patient is participating more    Update spoke with [place] at [facility].  They can offer.  Insurance Josem Kaufmann is pending.  Patient in agreement   Expected Discharge Plan: [place] Barriers to Discharge: Continued Medical Work up  Expected Discharge Plan and Services Expected Discharge Plan: [facility] In-house Referral: Clinical Social Work Discharge Planning Services: CM Consult Post Acute Care Choice: Nursing Home Living arrangements for the past 2 months: Single Family Home                                       Social Determinants of Health (SDOH) Interventions    Readmission Risk Interventions No flowsheet data found.

## 2020-02-22 NOTE — Care Management Important Message (Signed)
Important Message  Patient Details  Name: Brittney Shepard MRN: [id number] Date of Birth: Jul 17, 1950   Medicare Important Message Given:  Yes     [name] 02/22/2020, 1:35 PM

## 2020-02-26 ENCOUNTER — Other Ambulatory Visit: Payer: Self-pay | Admitting: Hospice and Palliative Medicine

## 2020-02-26 DIAGNOSIS — Z515 Encounter for palliative care: Secondary | ICD-10-CM

## 2020-02-26 NOTE — Progress Notes (Signed)
Order for palliative care at SNF.

## 2020-03-01 ENCOUNTER — Non-Acute Institutional Stay: Payer: Medicare Other | Admitting: Primary Care

## 2020-03-01 ENCOUNTER — Other Ambulatory Visit: Payer: Self-pay

## 2020-03-01 DIAGNOSIS — Z515 Encounter for palliative care: Secondary | ICD-10-CM

## 2020-03-01 DIAGNOSIS — C679 Malignant neoplasm of bladder, unspecified: Secondary | ICD-10-CM

## 2020-03-01 NOTE — Progress Notes (Signed)
[facility] Consult Note Telephone: [phone]  Fax: [phone]  PATIENT NAME: Brittney Shepard [address] [place] [address] [phone] (home)  DOB: 04-May-1951 MRN: [id number]  PRIMARY CARE PROVIDER:    Marguerita Merles, [place],  [place] [address] [phone]  REFERRING PROVIDER:    Gennie Alma, [place],  [place] [address]   RESPONSIBLE PARTY:   Extended Emergency Contact Information Primary Emergency Contact: [place] Mobile Phone: [phone] Relation: Son  I met face to face with patient in facility.   ASSESSMENT AND RECOMMENDATIONS:   1. Advance Care Planning/Goals of Care: Goals include to maximize quality of life and symptom management. Our advance care planning conversation included a discussion about:     Hospice revoked for therapeutic course of treatment  2. Symptom Management:   I met with Brittney Shepard today in her SNF room. She is here for rehab having rescinded hospice services in order to have physical therapy. Today she states that her appetite is fair. She does not like nutritional supplements. She denies pain. She does endorse weakness. The OT working with her states that she has had some weakness with rising for ambulation. Patient states that her goal is to maximize her therapy and then return to her home with hospice services. I will continue to follow patient while she receives rehab services.    3. Follow up Palliative Care Visit: Palliative care will continue to follow for goals of care clarification and symptom management. Return 2-3 weeks or prn.  4. Family /Caregiver/Community Supports: currently in SNF, from home and plan to return there on hospice.  5. Cognitive / Functional decline: a and o x 2-3, weakness and debility.  I spent 25 minutes providing this consultation,  from 1230 to 1255. More than 50% of the time in this consultation was spent  coordinating communication.   CHIEF COMPLAINT: debility  HISTORY OF PRESENT ILLNESS:  Brittney Shepard is a 69 y.o. year old female  with bladder cancer,metastatic disease,  increased debility. We are asked to consult around goals of care and symptom management.    Palliative Care was asked to follow this patient by consultation request of Gennie Alma MD to help address advance care planning and goals of care. This is the initial visit.  CODE STATUS:  DNR  PPS: 30%  HOSPICE ELIGIBILITY/DIAGNOSIS: yes with concordant goals of care  ROS  General: NAD EYES: denies vision changes ENMT: denies dysphagia Cardiovascular: denies chest pain Pulmonary: denies  cough, denies increased SOB, endorses DOE  Abdomen: endorses fair appetite, denies constipation GU: denies dysuria, foley catheter MSK:  endorses ROM limitations, no falls reported Skin: denies rashes or wounds Neurological: endorses weakness,denies pain, denies insomnia Psych: Endorses positive mood   Physical Exam: Current and past weights: 260.2 lbs, 300 lbs noted in epic chart 8/21. Constitutional: NAD General :frail appearing,Obese  EYES: anicteric sclera,EOMI, lids intact, no discharge  ENMT: intact hearing,oral mucous membranes moist CV: S1S2, RRR, 3+ LE edema Pulmonary: LCTA, no increased work of breathing, no cough, Abdomen: intake < 50%, abdomen soft and non tender, no ascites GU: deferred MSK: , decreased ROM in all extremities, no contractures of LE, non ambulatory Skin: warm and dry, no rashes or wounds on visible skin Neuro: Weakness,  grossly non -focal Psych: non -anxious affect, A and O x 2  CURRENT PROBLEM LIST:  Patient Active Problem List   Diagnosis Date Noted  . Non-intractable  vomiting   . Cerebellar stroke ([place])   . Metastatic carcinoma ([place])   . COPD with acute bronchitis ([place]) 02/14/2020  . Dizziness 02/14/2020  . Dehydration 02/14/2020  . AKI (acute kidney injury) ([place]) 02/14/2020  .  Palliative care encounter   . Hypokalemia 12/22/2019  . Endometrial cancer ([place]) 12/20/2019  . Goals of care, counseling/discussion 12/04/2019  . 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 Weakness   . Liver metastases ([place])   . Essential hypertension   . Type 2 diabetes mellitus without complication, without long-term current use of insulin ([place])   . Morbid obesity with BMI of 45.0-49.9, adult ([place])   . Atrial fibrillation ([place]) 11/19/2019  . Hematuria, gross   . Postmenopausal bleeding   . Abnormal endometrial ultrasound   . Atrial fibrillation with RVR ([place]) 11/18/2019  . Bladder mass   . Fall   . Acute cystitis without hematuria   . Chronic diastolic CHF (congestive heart failure) ([place]) 05/27/2016  . Primary osteoarthritis of both knees 05/27/2016  . Progressive anemia 05/27/2016  . DNR (do not resuscitate)   . Palliative care by specialist   . Acute respiratory failure with hypoxia and hypercapnia ([place])   . Atrial fibrillation with rapid ventricular response ([place]) 03/20/2016  . Chronic obstructive pulmonary disease with acute exacerbation (HCC)    PAST MEDICAL HISTORY:  Active Ambulatory Problems    Diagnosis Date Noted  . Chronic obstructive pulmonary disease with acute exacerbation ([employer])   . Atrial fibrillation with rapid ventricular response ([place]) 03/20/2016  . DNR (do not resuscitate)   . Palliative care by specialist   . Acute respiratory failure with hypoxia and hypercapnia ([place])   . Atrial fibrillation with RVR ([place]) 11/18/2019  . Bladder mass   . Fall   . Acute cystitis without hematuria   . Hematuria, gross   . Atrial fibrillation ([place]) 11/19/2019  . Postmenopausal bleeding   . Abnormal endometrial ultrasound   . 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 Weakness   . Liver metastases ([place])   . Essential hypertension   . Type 2 diabetes mellitus without complication, without long-term current use of insulin ([place])   . Morbid obesity with BMI of 45.0-49.9,  adult ([place])   . Goals of care, counseling/discussion 12/04/2019  . Chronic diastolic CHF (congestive heart failure) ([place]) 05/27/2016  . Primary osteoarthritis of both knees 05/27/2016  . Progressive anemia 05/27/2016  . Endometrial cancer ([place]) 12/20/2019  . Hypokalemia 12/22/2019  . Palliative care encounter   . COPD with acute bronchitis ([place]) 02/14/2020  . Dizziness 02/14/2020  . Dehydration 02/14/2020  . AKI (acute kidney injury) ([place]) 02/14/2020  . Cerebellar stroke ([place])   . Metastatic carcinoma ([place])   . Non-intractable vomiting    Resolved Ambulatory Problems    Diagnosis Date Noted  . No Resolved Ambulatory Problems   Past Medical History:  Diagnosis Date  . A-fib ([place])   . Bladder cancer ([place])   . CHF (congestive heart failure) ([place])   . Chronic pain   . Cognitive communication deficit   . Diabetes mellitus without complication ([place])   . Hypertension   . Muscle weakness (generalized)   . 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 Secondary malignant neoplasm of liver ([place])   . Stroke [facility]) 2013  . Urinary tract infection, site not specified   ,  Past Medical History:  Diagnosis Date  . A-fib ([place])   . Bladder cancer ([place])   . CHF (  congestive heart failure) ([place])   . Chronic pain   . Cognitive communication deficit   . Diabetes mellitus without complication ([place])   . Hypertension   . Hypokalemia   . Muscle weakness (generalized)   . 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 Secondary malignant neoplasm of liver ([place])   . Stroke [facility]) 2013  . Urinary tract infection, site not specified    SOCIAL HX:  Social History   Tobacco Use  . Smoking status: Never Smoker  . Smokeless tobacco: Never Used  Substance Use Topics  . Alcohol use: No   FAMILY HX:  Family History  Problem Relation Age of Onset  . Ovarian cancer Mother   . Cancer Sister     ALLERGIES:  Allergies  Allergen Reactions  . Penicillin G Hives  . Sulfa Antibiotics Rash      PERTINENT MEDICATIONS:  Outpatient Encounter Medications as of 03/01/2020  Medication Sig  . acetaminophen (TYLENOL) 325 MG tablet Take 650 mg by mouth every 6 (six) hours as needed.  Marland Kitchen apixaban (ELIQUIS) 2.5 MG TABS tablet Take 1 tablet (2.5 mg total) by mouth 2 (two) times daily.  Marland Kitchen diltiazem (CARDIZEM SR) 60 MG 12 hr capsule Take 1 capsule (60 mg total) by mouth every 12 (twelve) hours. (Patient taking differently: Take 60 mg by mouth 2 (two) times daily. )  . feeding supplement (ENSURE ENLIVE / ENSURE PLUS) LIQD Take 237 mLs by mouth 2 (two) times daily between meals.  Marland Kitchen ipratropium-albuterol (DUONEB) 0.5-2.5 (3) MG/3ML SOLN Take 3 mLs by nebulization 3 (three) times daily.  . meclizine (ANTIVERT) 25 MG tablet Take 1 tablet (25 mg total) by mouth 2 (two) times daily.  . metFORMIN (GLUCOPHAGE) 500 MG tablet Take 500 mg by mouth daily with breakfast.   . metoprolol succinate (TOPROL-XL) 25 MG 24 hr tablet Take 0.5 tablets (12.5 mg total) by mouth 2 (two) times daily.  . Multiple Vitamin (MULTIVITAMIN) tablet Take 1 tablet by mouth daily.   . polyethylene glycol (MIRALAX / GLYCOLAX) 17 g packet Take 17 g by mouth daily as needed for mild constipation.  . polyvinyl alcohol (LIQUIFILM TEARS) 1.4 % ophthalmic solution Place 1 drop into both eyes 2 (two) times daily.  Marland Kitchen senna-docusate (SENOKOT-S) 8.6-50 MG tablet Take 1 tablet by mouth 2 (two) times daily.  . sodium chloride (OCEAN) 0.65 % SOLN nasal spray Place 1 spray into both nostrils as needed for congestion.  . VESICARE 10 MG tablet Take 10 mg by mouth daily.  . [DISCONTINUED] acetaminophen (TYLENOL) 325 MG tablet Take 2 tablets (650 mg total) by mouth every 6 (six) hours as needed for mild pain (or Fever >/= 101).   No facility-administered encounter medications on file as of 03/01/2020.     Jason Coop, NP , DNP, MPH, AGPCNP-BC, ACHPN  COVID-19 PATIENT SCREENING TOOL  Person answering questions:  ____________staff______ _____   1.  Is the patient or any family member in the home showing any signs or symptoms regarding respiratory infection?               Person with Symptom- __________NA_________________  a. Fever  Yes___ No___          ___________________  b. Shortness of breath                                                    Yes___ No___          ___________________ c. Cough/congestion                                       Yes___  No___         ___________________ d. Body aches/pains                                                         Yes___ No___        ____________________ e. Gastrointestinal symptoms (diarrhea, nausea)           Yes___ No___        ____________________  2. Within the past 14 days, has anyone living in the home had any contact with someone with or under investigation for COVID-19?    Yes___ No_X_   Person __________________

## 2020-03-06 NOTE — Progress Notes (Signed)
Gynecologic Oncology No Show Note  Referring Provider: Dr. Georgianne Fick  Chief Complaint: Pelvic Mass  Subjective:  Brittney Shepard is a 69 y.o. female who is seen in consultation from Dr. Georgianne Fick & Dr. Rogue Bussing for carcinoma of unclear primary.   She was seen in ER after mechanical fall and was noted to have bladder mass on imaging to evaluate for fractures. Transurethral resection on 11/20/19 with left ureteral stent placement with Dr. 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 MRI revealed significantly thickened and cystic endometrium with myometrial invasion favored as well as a liver lesion which was biopsied on 11/22/19.   Pathology: A. LIVER; ULTRASOUND-GUIDED CORE NEEDLE BIOPSY:  - POSITIVE FOR MALIGNANCY.  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   Comment:  Immunohistochemical studies show tumor cells to be positive for CK7 and CK19, with focal faint expression of CD20. Tumor cells are negative for CDX-2, GATA-3, TTF-1, and Pax-8. Additional stains were attempted, but malignant tissue is exhausted in the core utilized for these studies.   The immunohistochemical pattern is relatively non-specific. The patient's recent diagnosis of invasive urothelial carcinoma is noted, but the histologic pattern does not appear similar, and lack of staining for GATA3 would suggest against this. Concern for a gynecologic malignancy is also noted, but lack of marking for Pax-8 would suggest against this. CK7 positivity may be seen in carcinomas arising in the upper GI tract, lung, pancreatobiliary tree, and thymus, among other  sites. Lack of marking for TTF-1 would suggest against lung. The presence of CK19 staining, while not specific, may be seen in carcinomas arising from the pancreatobiliary tree, as well as urinary bladder, endometrium, lung, and kidney. Recent laboratory testing  indicates liver  function testing within normal limits. Clinical and radiographic correlation is recommended.   Pap smear obtained on 12/31/19 (manually guided as patient unable to tolerate speculum exam d/t cervical stenosis reported NILM, HPV negative. Dr. Georgianne Fick attempted endometrial biopsy on 11/27/19 but was unable to obtain due to anatomic challenges. She was referred to gyn onc for further evaluation and consideration of operative hysteroscopy D&C and biopsies. She was seen by Dr. Rogue Bussing on 12/04/19. He discussed incurable/stage IV disease.   bHCG < 1 AFP 1.1 LDH 152 CA 125- 23.3  Currently awaiting PET scan and Foundation 1.   She resides at [facility] post hospital discharge. History of CHF, morbid obesity, a fib, not on anticoagulation, history of stroke, COPD, not on home O2.   Brittney Shepard did not come for her visit as scheduled. We contacted the patient. She has an Urology appointment and she is not able to come to clinic when there is appointment availability.   Dr. Theora Gianotti spoke with Dr. Rogue Bussing and they reviewed the pathology results; she also reviewed imaging. The patient is not a surgical candidate. The IHC is not consistent gyn primary. Given the extent to disease recommendation was to proceed with chemotherapy given the presence of metastatic disease.   I personally reviewed the presentation and findings with Ms. Beckey Rutter, NP. I spoke with Dr. Rogue Bussing. I also reached out to Dr. Georgianne Fick.   I agree with the above documentation, assessment and plan. I do not think Brittney Shepard needs to be seen by our team. I agree with Dr. Aletha Halim plan to start chemotherapy with paclitaxel and carboplatin.   Brittney Gaetana Michaelis, MD

## 2020-03-07 ENCOUNTER — Other Ambulatory Visit: Payer: Self-pay | Admitting: Urology

## 2020-03-08 ENCOUNTER — Encounter: Payer: Self-pay | Admitting: Urology

## 2020-03-11 ENCOUNTER — Encounter: Payer: Self-pay | Admitting: Cardiology

## 2020-03-11 ENCOUNTER — Other Ambulatory Visit: Payer: Self-pay

## 2020-03-11 ENCOUNTER — Ambulatory Visit (INDEPENDENT_AMBULATORY_CARE_PROVIDER_SITE_OTHER): Payer: Medicare Other | Admitting: Cardiology

## 2020-03-11 VITALS — BP 100/70 | HR 73 | Ht 65.0 in | Wt 250.0 lb

## 2020-03-11 DIAGNOSIS — I10 Essential (primary) hypertension: Secondary | ICD-10-CM | POA: Diagnosis not present

## 2020-03-11 DIAGNOSIS — I4819 Other persistent atrial fibrillation: Secondary | ICD-10-CM | POA: Diagnosis not present

## 2020-03-11 MED ORDER — METOPROLOL SUCCINATE ER 25 MG PO TB24: 12.5000 mg | ORAL_TABLET | Freq: Every day | ORAL | 5 refills | Status: AC

## 2020-03-11 NOTE — Patient Instructions (Signed)
Medication Instructions:   Your physician has recommended you make the following change in your medication:   DECREASE metoprolol succinate (TOPROL-XL) 25 MG 24 hr tablet:   Take 0.5 tablets (12.5 mg total) by mouth daily    Lab Work: None Ordered If you have labs (blood work) drawn today and your tests are completely normal, you will receive your results only by: Marland Kitchen MyChart Message (if you have MyChart) OR . A paper copy in the mail If you have any lab test that is abnormal or we need to change your treatment, we will call you to review the results.   Testing/Procedures: None Ordered   Follow-Up: At [facility], you and your health needs are our priority.  As part of our continuing mission to provide you with exceptional heart care, we have created designated Provider Care Teams.  These Care Teams include your primary Cardiologist (physician) and Advanced Practice Providers (APPs -  Physician Assistants and Nurse Practitioners) who all work together to provide you with the care you need, when you need it.  We recommend signing up for the patient portal called "MyChart".  Sign up information is provided on this After Visit Summary.  MyChart is used to connect with patients for Virtual Visits (Telemedicine).  Patients are able to view lab/test results, encounter notes, upcoming appointments, etc.  Non-urgent messages can be sent to your provider as well.   To learn more about what you can do with MyChart, go to [email or web address].    Your next appointment:   6 month(s)  The format for your next appointment:   In Person  Provider:   Kate Sable, MD   Other Instructions

## 2020-03-11 NOTE — Progress Notes (Signed)
Cardiology Office Note:    Date:  03/11/2020   ID:  Brittney Shepard, DOB 12-02-1950, MRN [id number]  PCP:  Brittney Merles, MD  [facility] Cardiologist:  Brittney Sable, MD  [place] Electrophysiologist:  None   Referring MD: Brittney Merles, MD   Chief Complaint  Patient presents with  . OTHER    F/u [facility] afib no complaints today. Pt's facility will fax to office unable to verify meds at this time.    History of Present Illness:    Brittney Shepard is a 69 y.o. female with a hx of persistent A. fib, hypertension, bladder tumor status TURBT 11/2019 who presents for follow-up.  She was recently admitted for vertigo-like symptoms, found to have new infarct within the right cerebellar hemisphere.  Placed on Cardizem SR 60 twice daily, Toprol-XL 12.5 mg twice daily.  Due to history of CVA, Eliquis 2.5 mg twice daily restarted.  She has no complaints today, currently in skilled nursing facility  Prior notes.  Echo 11/19/2019, EF over 55%.  Previously, anticoagulation was discussed but patient wanted to continue on aspirin despite CHA2DS2-VASc of 5.  She was diagnosed with a bladder tumor and was undergoing procedure , hence also preventing anticoagulation initiation.  Patient had biopsy performed to bladder tumor.  Results are still pending.  She takes aspirin 81 mg daily.  Denies any bleeding issues.  She states her blood pressures have been running a little low.    Past Medical History:  Diagnosis Date  . A-fib ([place])   . Bladder cancer ([place])   . CHF (congestive heart failure) ([place])   . Chronic pain   . Cognitive communication deficit   . Diabetes mellitus without complication ([place])   . Hypertension   . Hypokalemia   . Muscle weakness (generalized)   . 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 Secondary malignant neoplasm of liver ([place])   . Stroke [facility]) 2013  . Urinary tract infection, site not specified     Past Surgical History:  Procedure Laterality  Date  . CYSTOSCOPY W/ RETROGRADES N/A 11/20/2019   Procedure: CYSTOSCOPY WITH RETROGRADE PYELOGRAM;  Surgeon: Brittney Co, MD;  Location: [facility] ORS;  Service: Urology;  Laterality: N/A;  . CYSTOSCOPY WITH STENT PLACEMENT Left 11/20/2019   Procedure: CYSTOSCOPY WITH STENT PLACEMENT;  Surgeon: Brittney Co, MD;  Location: [facility] ORS;  Service: Urology;  Laterality: Left;  . TRANSURETHRAL RESECTION OF BLADDER TUMOR N/A 11/20/2019   Procedure: TRANSURETHRAL RESECTION OF BLADDER TUMOR (TURBT);  Surgeon: Brittney Co, MD;  Location: [facility] ORS;  Service: Urology;  Laterality: N/A;    Current Medications: Current Meds  Medication Sig  . acetaminophen (TYLENOL) 325 MG tablet Take 650 mg by mouth every 6 (six) hours as needed.  Marland Kitchen apixaban (ELIQUIS) 2.5 MG TABS tablet Take 1 tablet (2.5 mg total) by mouth 2 (two) times daily.  Marland Kitchen diltiazem (CARDIZEM SR) 60 MG 12 hr capsule Take 1 capsule (60 mg total) by mouth every 12 (twelve) hours. (Patient taking differently: Take 60 mg by mouth 2 (two) times daily. )  . feeding supplement (ENSURE ENLIVE / ENSURE PLUS) LIQD Take 237 mLs by mouth 2 (two) times daily between meals.  Marland Kitchen ipratropium-albuterol (DUONEB) 0.5-2.5 (3) MG/3ML SOLN Take 3 mLs by nebulization 3 (three) times daily.  . meclizine (ANTIVERT) 25 MG tablet Take 1 tablet (25 mg total) by mouth 2 (two) times daily.  . metFORMIN (GLUCOPHAGE) 500 MG tablet Take 500 mg  by mouth daily with breakfast.   . metoprolol succinate (TOPROL-XL) 25 MG 24 hr tablet Take 0.5 tablets (12.5 mg total) by mouth daily.  . Multiple Vitamin (MULTIVITAMIN) tablet Take 1 tablet by mouth daily.   . polyethylene glycol (MIRALAX / GLYCOLAX) 17 g packet Take 17 g by mouth daily as needed for mild constipation.  . polyvinyl alcohol (LIQUIFILM TEARS) 1.4 % ophthalmic solution Place 1 drop into both eyes 2 (two) times daily.  Marland Kitchen senna-docusate (SENOKOT-S) 8.6-50 MG tablet Take 1 tablet by mouth 2 (two) times daily.  . sodium  chloride (OCEAN) 0.65 % SOLN nasal spray Place 1 spray into both nostrils as needed for congestion.  . VESICARE 10 MG tablet Take 10 mg by mouth daily.  . [DISCONTINUED] metoprolol succinate (TOPROL-XL) 25 MG 24 hr tablet Take 0.5 tablets (12.5 mg total) by mouth 2 (two) times daily.     Allergies:   Penicillin g and Sulfa antibiotics   Social History   Socioeconomic History  . Marital status: Widowed    Spouse name: Not on file  . Number of children: Not on file  . Years of education: Not on file  . Highest education level: Not on file  Occupational History  . Not on file  Tobacco Use  . Smoking status: Never Smoker  . Smokeless tobacco: Never Used  Substance and Sexual Activity  . Alcohol use: No  . Drug use: No  . Sexual activity: Not Currently  Other Topics Concern  . Not on file  Social History Narrative   Retd. 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 Has one son.    Social Determinants of Health   Financial Resource Strain:   . Difficulty of Paying Living Expenses: Not on file  Food Insecurity:   . Worried About Charity fundraiser in the Last Year: Not on file  . Ran Out of Food in the Last Year: Not on file  Transportation Needs:   . Lack of Transportation (Medical): Not on file  . Lack of Transportation (Non-Medical): Not on file  Physical Activity:   . Days of Exercise per Week: Not on file  . Minutes of Exercise per Session: Not on file  Stress:   . Feeling of Stress : Not on file  Social Connections:   . Frequency of Communication with Friends and Family: Not on file  . Frequency of Social Gatherings with Friends and Family: Not on file  . Attends Religious Services: Not on file  . Active Member of Clubs or Organizations: Not on file  . Attends Archivist Meetings: Not on file  . Marital Status: Not on file     Family History: The patient's family history includes Cancer in her sister; Ovarian cancer in her  mother.  ROS:   Please see the history of present illness.     All other systems reviewed and are negative.  EKGs/Labs/Other Studies Reviewed:    The following studies were reviewed today:   EKG:  EKG is  ordered today.  The ekg ordered today demonstrates atrial fibrillation, heart rate 73  Recent Labs: 02/15/2020: ALT 7; TSH 0.821 02/18/2020: B Natriuretic Peptide 380.9 02/22/2020: BUN 15; Creatinine, Ser 0.80; Hemoglobin 11.2; Magnesium 1.9; Platelets 298; Potassium 3.8; Sodium 135  Recent Lipid Panel    Component Value Date/Time   CHOL 144 02/16/2020 0458   TRIG 160 (H) 02/16/2020 0458   HDL 22 (L) 02/16/2020 0458   CHOLHDL 6.5 02/16/2020  0458   VLDL 32 02/16/2020 0458   LDLCALC 90 02/16/2020 0458    Physical Exam:    VS:  BP 100/70 (BP Location: Left Wrist, Patient Position: Sitting, Cuff Size: Large)   Pulse 73   Ht 5\' 5"  (1.651 m)   Wt 250 lb (113.4 kg)   SpO2 98%   BMI 41.60 kg/m     Wt Readings from Last 3 Encounters:  03/11/20 250 lb (113.4 kg)  02/22/20 264 lb 4.8 oz (119.9 kg)  01/15/20 250 lb (113.4 kg)     GEN:  Well nourished, well developed in no acute distress HEENT: Normal NECK: No JVD; No carotid bruits LYMPHATICS: No lymphadenopathy CARDIAC: Irregular irregular, no murmurs RESPIRATORY: Clear anteriorly ABDOMEN: Soft, non-tender, non-distended MUSCULOSKELETAL:  2+ lymphedema/ edema; No deformity  SKIN: Warm and dry NEUROLOGIC:  Alert and oriented x 3 PSYCHIATRIC:  Normal affect   ASSESSMENT:    1. Persistent atrial fibrillation ([place])   2. Hypertension, unspecified type   3. Morbid obesity ([place])    PLAN:    In order of problems listed above:  1. Persistent atrial fibrillation, CHA2DS2-VASc score 5.  Heart rate controlled.  Eliquis 2.5 mg twice daily.  Continue Cardizem SR 60 mg twice daily, Will decrease Toprol to daily dosing.  Recent echocardiogram showed EF 55%. 2. History of hypertension, blood pressures today low normal.   Decrease Toprol-XL as above. 3. Morbidly obese, weight loss recommended  Follow-up 6 months  Total encounter time 40 minutes  Greater than 50% was spent in counseling and coordination of care with the patient    Medication Adjustments/Labs and Tests Ordered: Current medicines are reviewed at length with the patient today.  Concerns regarding medicines are outlined above.  Orders Placed This Encounter  Procedures  . EKG 12-Lead   Meds ordered this encounter  Medications  . metoprolol succinate (TOPROL-XL) 25 MG 24 hr tablet    Sig: Take 0.5 tablets (12.5 mg total) by mouth daily.    Dispense:  30 tablet    Refill:  5    Patient Instructions  Medication Instructions:   Your physician has recommended you make the following change in your medication:   DECREASE metoprolol succinate (TOPROL-XL) 25 MG 24 hr tablet:   Take 0.5 tablets (12.5 mg total) by mouth daily    Lab Work: None Ordered If you have labs (blood work) drawn today and your tests are completely normal, you will receive your results only by: Marland Kitchen MyChart Message (if you have MyChart) OR . A paper copy in the mail If you have any lab test that is abnormal or we need to change your treatment, we will call you to review the results.   Testing/Procedures: None Ordered   Follow-Up: At [facility], you and your health needs are our priority.  As part of our continuing mission to provide you with exceptional heart care, we have created designated Provider Care Teams.  These Care Teams include your primary Cardiologist (physician) and Advanced Practice Providers (APPs -  Physician Assistants and Nurse Practitioners) who all work together to provide you with the care you need, when you need it.  We recommend signing up for the patient portal called "MyChart".  Sign up information is provided on this After Visit Summary.  MyChart is used to connect with patients for Virtual Visits (Telemedicine).  Patients are able to  view lab/test results, encounter notes, upcoming appointments, etc.  Non-urgent messages can be sent to your provider as well.  To learn more about what you can do with MyChart, go to [email or web address].    Your next appointment:   6 month(s)  The format for your next appointment:   In Person  Provider:   Kate Sable, MD   Other Instructions      Signed, Brittney Sable, MD  03/11/2020 12:35 PM    [place]

## 2020-03-14 ENCOUNTER — Non-Acute Institutional Stay: Payer: Medicare Other | Admitting: Primary Care

## 2020-03-14 ENCOUNTER — Other Ambulatory Visit: Payer: Self-pay

## 2020-03-14 DIAGNOSIS — Z6841 Body Mass Index (BMI) 40.0 and over, adult: Secondary | ICD-10-CM

## 2020-03-14 DIAGNOSIS — C799 Secondary malignant neoplasm of unspecified site: Secondary | ICD-10-CM

## 2020-03-14 DIAGNOSIS — I5032 Chronic diastolic (congestive) heart failure: Secondary | ICD-10-CM

## 2020-03-14 DIAGNOSIS — C679 Malignant neoplasm of bladder, unspecified: Secondary | ICD-10-CM

## 2020-03-14 DIAGNOSIS — Z515 Encounter for palliative care: Secondary | ICD-10-CM

## 2020-03-14 DIAGNOSIS — M170 Bilateral primary osteoarthritis of knee: Secondary | ICD-10-CM

## 2020-03-14 NOTE — Progress Notes (Signed)
[facility] Palliative Care Consult Note Telephone: [phone]  Fax: [phone]   TELEHEALTH VISIT STATEMENT Due to the COVID-19 crisis, this visit was done via telemedicine from my office. It was initiated and consented to by this patient and/or family.   Date of encounter: 03/14/20 PATIENT NAME: Brittney Shepard [place] [id number] [phone] (home)  DOB: 22-Sep-1950 MRN: [id number]  PRIMARY CARE PROVIDER:    Marguerita Merles, MD,  [place] [address] [phone]  REFERRING PROVIDER:   Gennie Shepard, [place],   [address] [phone]  RESPONSIBLE PARTY:   Extended Emergency Contact Information Primary Emergency Contact: Brittney Shepard Mobile Phone: [phone] Relation: Son   Palliative Care was asked to follow this patient by consultation request of Brittney Alma, MD to help address advance care planning and goals of care. This is a follow up  visit.   ASSESSMENT AND RECOMMENDATIONS:   1. Advance Care Planning/Goals of Care: Goals include to maximize quality of life and symptom management. DNR on record  2. Symptom Management:   I spoke with SNF staff about patient's progress. She has been moved from the skilled area to the long-term residential area. Staff was not able to confirm that she is now going to reside at [facility]. I have a call into the office to discuss this tomorrow. If she's no longer seeking rehab I will revisit admission to hospice. She had revoked hospice in order to do rehab. Staff states that she does not have any pain or discomfort. She uses PRN medication if needed. She's eating well but she is weak and needs help with transfers. Her Foley remains intact and patent.  3. Follow up Palliative Care Visit: Palliative care will continue to follow for goals of care clarification and symptom management. Return 2 weeks or prn.  4. Family  /Caregiver/Community Supports:  Son is PR, pt makes own decisions.  5. Cognitive / Functional decline: A and O x 2-3, Dependent in many adls.  I spent 25 minutes providing this consultation,  from 1530 to 1555. More than 50% of the time in this consultation was spent coordinating communication.   CODE STATUS:DNR  PPS: 40%  HOSPICE ELIGIBILITY/DIAGNOSIS: TBD  Subjective:  CHIEF COMPLAINT: weakness  HISTORY OF PRESENT ILLNESS:  Brittney Shepard is a 69 y.o. year old female  with debility due to metastatic cancer  Recurrent UTI and recent hospital stay. Here for rehab. Had been on hospice prior.   We are asked to consult around goals of care and sx management, hospice transition when appropriate.    History obtained from review of EMR, discussion with primary team, and  interview with family, caregiver  and/or Brittney Shepard. Records reviewed and summarized above. .  CURRENT PROBLEM LIST:  Patient Active Problem List   Diagnosis Date Noted  . Non-intractable vomiting   . Cerebellar stroke ([place])   . Metastatic carcinoma ([place])   . COPD with acute bronchitis ([place]) 02/14/2020  . Dizziness 02/14/2020  . Dehydration 02/14/2020  . AKI (acute kidney injury) ([place]) 02/14/2020  . Palliative care encounter   . Hypokalemia 12/22/2019  . Endometrial cancer ([place]) 12/20/2019  . Goals of care, counseling/discussion 12/04/2019  . 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 Weakness   . Liver metastases ([place])   . Essential hypertension   . Type 2 diabetes mellitus without complication, without long-term current use of insulin ([place])   . Morbid obesity  with BMI of 45.0-49.9, adult ([place])   . Atrial fibrillation ([place]) 11/19/2019  . Hematuria, gross   . Postmenopausal bleeding   . Abnormal endometrial ultrasound   . Atrial fibrillation with RVR ([place]) 11/18/2019  . Bladder mass   . Fall   . Acute cystitis without hematuria   . Chronic diastolic CHF (congestive heart failure) ([place]) 05/27/2016  .  Primary osteoarthritis of both knees 05/27/2016  . Progressive anemia 05/27/2016  . DNR (do not resuscitate)   . Palliative care by specialist   . Acute respiratory failure with hypoxia and hypercapnia ([place])   . Atrial fibrillation with rapid ventricular response ([place]) 03/20/2016  . Chronic obstructive pulmonary disease with acute exacerbation (HCC)    PAST MEDICAL HISTORY:  Active Ambulatory Problems    Diagnosis Date Noted  . Chronic obstructive pulmonary disease with acute exacerbation ([place])   . Atrial fibrillation with rapid ventricular response ([place]) 03/20/2016  . DNR (do not resuscitate)   . Palliative care by specialist   . Acute respiratory failure with hypoxia and hypercapnia ([place])   . Atrial fibrillation with RVR ([place]) 11/18/2019  . Bladder mass   . Fall   . Acute cystitis without hematuria   . Hematuria, gross   . Atrial fibrillation ([place]) 11/19/2019  . Postmenopausal bleeding   . Abnormal endometrial ultrasound   . 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 Weakness   . Liver metastases ([place])   . Essential hypertension   . Type 2 diabetes mellitus without complication, without long-term current use of insulin ([place])   . Morbid obesity with BMI of 45.0-49.9, adult ([place])   . Goals of care, counseling/discussion 12/04/2019  . Chronic diastolic CHF (congestive heart failure) ([place]) 05/27/2016  . Primary osteoarthritis of both knees 05/27/2016  . Progressive anemia 05/27/2016  . Endometrial cancer ([place]) 12/20/2019  . Hypokalemia 12/22/2019  . Palliative care encounter   . COPD with acute bronchitis ([place]) 02/14/2020  . Dizziness 02/14/2020  . Dehydration 02/14/2020  . AKI (acute kidney injury) ([place]) 02/14/2020  . Cerebellar stroke ([place])   . Metastatic carcinoma ([place])   . Non-intractable vomiting    Resolved Ambulatory Problems    Diagnosis Date Noted  . No Resolved Ambulatory Problems   Past Medical History:  Diagnosis Date  . A-fib ([place])   . Bladder cancer  ([place])   . CHF (congestive heart failure) ([place])   . Chronic pain   . Cognitive communication deficit   . Diabetes mellitus without complication ([place])   . Hypertension   . Muscle weakness (generalized)   . 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 Secondary malignant neoplasm of liver ([place])   . Stroke [facility]) 2013  . Urinary tract infection, site not specified    SOCIAL HX:  Social History   Tobacco Use  . Smoking status: Never Smoker  . Smokeless tobacco: Never Used  Substance Use Topics  . Alcohol use: No   FAMILY HX:  Family History  Problem Relation Age of Onset  . Ovarian cancer Mother   . Cancer Sister     ALLERGIES:  Allergies  Allergen Reactions  . Penicillin G Hives  . Sulfa Antibiotics Rash     PERTINENT MEDICATIONS:  Outpatient Encounter Medications as of 03/14/2020  Medication Sig  . acetaminophen (TYLENOL) 325 MG tablet Take 650 mg by mouth every 6 (six) hours as needed.  Marland Kitchen apixaban (ELIQUIS) 2.5 MG TABS tablet Take 1 tablet (2.5 mg total) by mouth 2 (two)  times daily.  Marland Kitchen diltiazem (CARDIZEM SR) 60 MG 12 hr capsule Take 1 capsule (60 mg total) by mouth every 12 (twelve) hours. (Patient taking differently: Take 60 mg by mouth 2 (two) times daily. )  . ipratropium-albuterol (DUONEB) 0.5-2.5 (3) MG/3ML SOLN Take 3 mLs by nebulization 3 (three) times daily.  . meclizine (ANTIVERT) 25 MG tablet Take 1 tablet (25 mg total) by mouth 2 (two) times daily.  . metFORMIN (GLUCOPHAGE) 500 MG tablet Take 500 mg by mouth daily with breakfast.   . metoprolol succinate (TOPROL-XL) 25 MG 24 hr tablet Take 0.5 tablets (12.5 mg total) by mouth daily.  . Multiple Vitamin (MULTIVITAMIN) tablet Take 1 tablet by mouth daily.   Marland Kitchen nystatin (NYSTATIN) powder Apply 1 application topically 2 (two) times daily.  . polyethylene glycol (MIRALAX / GLYCOLAX) 17 g packet Take 17 g by mouth daily as needed for mild constipation.  . polyvinyl alcohol (LIQUIFILM TEARS) 1.4 % ophthalmic  solution Place 1 drop into both eyes 2 (two) times daily.  Marland Kitchen senna-docusate (SENOKOT-S) 8.6-50 MG tablet Take 1 tablet by mouth 2 (two) times daily.  . sodium chloride (OCEAN) 0.65 % SOLN nasal spray Place 1 spray into both nostrils as needed for congestion.  . VESICARE 10 MG tablet Take 10 mg by mouth daily.  . feeding supplement (ENSURE ENLIVE / ENSURE PLUS) LIQD Take 237 mLs by mouth 2 (two) times daily between meals. (Patient not taking: Reported on 03/14/2020)   No facility-administered encounter medications on file as of 03/14/2020.    Objective: ROS deferred  Thank you for the opportunity to participate in the care of Ms. Lappe.  The palliative care team will continue to follow. Please call our office at [phone] if we can be of additional assistance.  Jason Coop, NP , DNP, MPH, AGPCNP-BC, [facility]

## 2020-03-15 ENCOUNTER — Telehealth: Payer: Self-pay | Admitting: Primary Care

## 2020-03-15 NOTE — Telephone Encounter (Signed)
T/c from [place]. Patient has elected to remain at [facility] for long term care. I will discuss hospice re admission with patient next week.

## 2020-03-15 NOTE — Telephone Encounter (Signed)
Patient has completed Skilled rehab at [facility] and has decided to stay there to reside. I spoke with PR son Heron Sabins. He is interested in her receiving hospice services again, which she had revoked for skilled rehab. Patient is bedbound, and will remain in LTC for her care. [facility] hospice notified patient would like to resume hospice services.

## 2020-03-18 ENCOUNTER — Telehealth: Payer: Self-pay | Admitting: Primary Care

## 2020-03-18 NOTE — Telephone Encounter (Signed)
Fax sent to [facility] and Dr. Gennie Alma to request hospice referral, per family request.

## 2020-04-10 ENCOUNTER — Ambulatory Visit: Payer: Self-pay | Admitting: Urology

## 2020-04-19 ENCOUNTER — Other Ambulatory Visit
Admission: RE | Admit: 2020-04-19 | Discharge: 2020-04-19 | Disposition: A | Discharge status: Home / Self Care | Source: Skilled Nursing Facility | Attending: General Practice | Admitting: General Practice

## 2020-04-19 DIAGNOSIS — R300 Dysuria: Secondary | ICD-10-CM | POA: Diagnosis not present

## 2020-04-19 LAB — URINALYSIS, COMPLETE (UACMP) WITH MICROSCOPIC
Bilirubin Urine: NEGATIVE
Glucose, UA: NEGATIVE mg/dL
Ketones, ur: NEGATIVE mg/dL
Nitrite: NEGATIVE
Protein, ur: 100 mg/dL — AB
RBC / HPF: >50 RBC/hpf — ABNORMAL HIGH (ref 0–5)
Specific Gravity, Urine: 1.016 (ref 1.005–1.030)
WBC, UA: >50 WBC/hpf — ABNORMAL HIGH (ref 0–5)
pH: 6 (ref 5.0–8.0)

## 2020-04-21 LAB — URINE CULTURE: Culture: >=100000 — AB

## 2020-06-04 DEATH — deceased

## 2020-09-20 ENCOUNTER — Ambulatory Visit: Payer: Medicare Other | Admitting: Cardiology

## 2020-11-14 ENCOUNTER — Encounter: Payer: Self-pay | Admitting: Internal Medicine

## 2021-03-24 IMAGING — CT NM PET TUM IMG INITIAL (PI) SKULL BASE T - THIGH
1 of 9 series · 1 of 25 positions shown · non-contrast
Comparison: Chest CT 11/22/2019. Abdominopelvic MRI 11/21/2019.
Abdominopelvic CT of 11/20/2019.

CLINICAL DATA: Initial treatment strategy for metastatic disease of
unknown primary. Prior trans urethral resection of bladder tumor
11/19/2019. Biopsy of liver 11/22/2019.

EXAM:
NUCLEAR MEDICINE PET SKULL BASE TO THIGH
TECHNIQUE: 14.4 mCi F-18 FDG was injected intravenously. Full-ring PET imaging
was performed from the skull base to thigh after the radiotracer. CT
data was obtained and used for attenuation correction and anatomic
localization.
Fasting blood glucose: 137 mg/dl

[Series 3: ct wb 5.0 b30f · axial · 5.0mm · 0.98mm/px · 1 of 329 slices shown]
[im 329/329  brain]
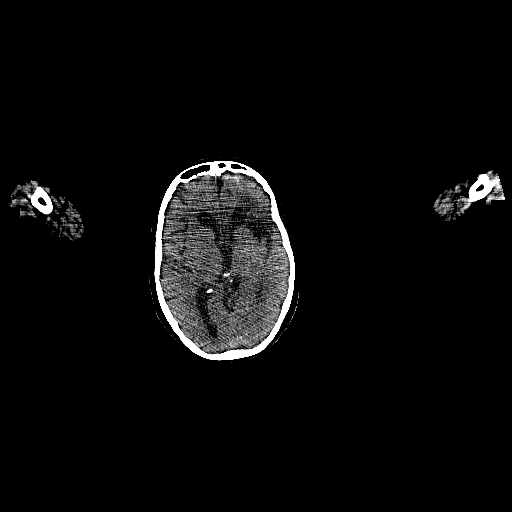

[1 of 25 positions shown; findings below may reference images not displayed]

FINDINGS: Mediastinal blood pool activity: SUV max

Liver activity: SUV max NA

NECK: No areas of abnormal hypermetabolism.

Incidental CT findings: Cerebral atrophy.  No cervical adenopathy.

CHEST: No pulmonary parenchymal or thoracic nodal hypermetabolism.
No hypermetabolism to correspond to the previously described
macrolobulated anterior mediastinal mass. 4.3 x 3.1 cm and 14.5 HU
on 80/3.

Incidental CT findings: Deferred to recent diagnostic CT.
Cardiomegaly. Aortic and coronary artery atherosclerosis.

ABDOMEN/PELVIS: Segment 4 B hypermetabolic liver mass measures 4.9 x
3.9 cm and a S.U.V. max of 22.0 on 145/3.

Porta hepatis nodal metastasis at 1.2 cm and a S.U.V. max of 10.2 on
135/3.

More equivocal abdominal retroperitoneal nodes, including at 1.1 cm
and a S.U.V. max of 3.6 in the pre caval space on 151/3.

No pelvic nodal hypermetabolism.

Hypermetabolism corresponding to endometrial soft tissue fullness
and heterogeneity. Example at on the order of 4.5 cm and a S.U.V.
max of 14.1 on 220/3.

The bladder is not well evaluated secondary to physiologic urinary
hypermetabolism. There is also urinary contamination about the
perineum.

Incidental CT findings: Deferred to recent CT and MRI. Abdominal
aortic atherosclerosis. Foley catheter within the urinary bladder.
Left ureteric stent without significant hydronephrosis.

SKELETON: No abnormal marrow activity.

Incidental CT findings: none
IMPRESSION: 1. Hypermetabolic endometrial mass, consistent with carcinoma.
2. Metastatic disease to the liver and upper abdominal node or nodes
as detailed above. Given the distribution of metastasis, and the
extent of endometrial hypermetabolism, endometrial primary is
favored.
3. No thoracic hypermetabolic metastasis. The macrolobulated
anterior mediastinal mass is not significantly hypermetabolic. Favor
pericardial or bronchogenic cyst. Thymoma or less likely thymic
carcinoma cannot be entirely excluded but are felt less likely.
Given comorbidities, of questionable clinical significance. Consider
chest CT follow-up at 3 months.

## 2021-08-03 ENCOUNTER — Encounter: Payer: Self-pay | Admitting: Internal Medicine
